# Patient Record
Sex: Female | Born: 1945
Health system: Southern US, Community
[De-identification: ages and names within clinical notes are randomized; demographics above are authoritative.]

## PROBLEM LIST (undated history)

## (undated) DIAGNOSIS — K31A0 Gastric intestinal metaplasia, unspecified: Secondary | ICD-10-CM

## (undated) DIAGNOSIS — N200 Calculus of kidney: Secondary | ICD-10-CM

## (undated) DIAGNOSIS — E876 Hypokalemia: Secondary | ICD-10-CM

## (undated) DIAGNOSIS — K2100 Gastro-esophageal reflux disease with esophagitis, without bleeding: Secondary | ICD-10-CM

## (undated) DIAGNOSIS — G588 Other specified mononeuropathies: Secondary | ICD-10-CM

## (undated) DIAGNOSIS — K602 Anal fissure, unspecified: Secondary | ICD-10-CM

## (undated) DIAGNOSIS — E039 Hypothyroidism, unspecified: Secondary | ICD-10-CM

## (undated) DIAGNOSIS — F419 Anxiety disorder, unspecified: Secondary | ICD-10-CM

## (undated) DIAGNOSIS — G4730 Sleep apnea, unspecified: Secondary | ICD-10-CM

## (undated) DIAGNOSIS — I10 Essential (primary) hypertension: Secondary | ICD-10-CM

## (undated) DIAGNOSIS — K219 Gastro-esophageal reflux disease without esophagitis: Secondary | ICD-10-CM

## (undated) DIAGNOSIS — K529 Noninfective gastroenteritis and colitis, unspecified: Secondary | ICD-10-CM

## (undated) HISTORY — DX: Gastro-esophageal reflux disease with esophagitis, without bleeding: K21.00

## (undated) HISTORY — DX: Essential (primary) hypertension: I10

## (undated) HISTORY — DX: Gastric intestinal metaplasia, unspecified: K31.A0

## (undated) HISTORY — DX: Sleep apnea, unspecified: G47.30

## (undated) HISTORY — DX: Hypokalemia: E87.6

## (undated) HISTORY — DX: Gastro-esophageal reflux disease without esophagitis: K21.9

## (undated) HISTORY — DX: Calculus of kidney: N20.0

## (undated) HISTORY — PX: TUBAL LIGATION: SHX77

## (undated) HISTORY — DX: Noninfective gastroenteritis and colitis, unspecified: K52.9

## (undated) HISTORY — DX: Anxiety disorder, unspecified: F41.9

## (undated) HISTORY — DX: Anal fissure, unspecified: K60.2

## (undated) HISTORY — DX: Other specified mononeuropathies: G58.8

---

## 1999-01-06 ENCOUNTER — Encounter: Payer: Self-pay | Admitting: Family Medicine

## 1999-01-06 ENCOUNTER — Ambulatory Visit (HOSPITAL_COMMUNITY): Admission: RE | Admit: 1999-01-06 | Discharge: 1999-01-06 | Payer: Self-pay | Admitting: Family Medicine

## 1999-02-15 ENCOUNTER — Other Ambulatory Visit: Admission: RE | Admit: 1999-02-15 | Discharge: 1999-02-15 | Payer: Self-pay | Admitting: Otolaryngology

## 1999-04-21 ENCOUNTER — Other Ambulatory Visit: Admission: RE | Admit: 1999-04-21 | Discharge: 1999-04-21 | Payer: Self-pay | Admitting: Family Medicine

## 1999-05-31 ENCOUNTER — Encounter: Payer: Self-pay | Admitting: Sports Medicine

## 1999-05-31 ENCOUNTER — Encounter: Admission: RE | Admit: 1999-05-31 | Discharge: 1999-05-31 | Payer: Self-pay | Admitting: Sports Medicine

## 1999-06-10 ENCOUNTER — Encounter: Admission: RE | Admit: 1999-06-10 | Discharge: 1999-06-10 | Payer: Self-pay | Admitting: Sports Medicine

## 1999-06-10 ENCOUNTER — Encounter: Payer: Self-pay | Admitting: Sports Medicine

## 1999-08-22 ENCOUNTER — Encounter: Payer: Self-pay | Admitting: Otolaryngology

## 1999-08-22 ENCOUNTER — Encounter: Admission: RE | Admit: 1999-08-22 | Discharge: 1999-08-22 | Payer: Self-pay | Admitting: Otolaryngology

## 2000-03-05 ENCOUNTER — Encounter: Admission: RE | Admit: 2000-03-05 | Discharge: 2000-03-05 | Payer: Self-pay | Admitting: Otolaryngology

## 2000-03-05 ENCOUNTER — Encounter: Payer: Self-pay | Admitting: Otolaryngology

## 2000-10-19 ENCOUNTER — Encounter: Payer: Self-pay | Admitting: Family Medicine

## 2000-10-19 ENCOUNTER — Encounter: Admission: RE | Admit: 2000-10-19 | Discharge: 2000-10-19 | Payer: Self-pay | Admitting: Family Medicine

## 2001-03-22 ENCOUNTER — Encounter: Payer: Self-pay | Admitting: Otolaryngology

## 2001-03-22 ENCOUNTER — Encounter: Admission: RE | Admit: 2001-03-22 | Discharge: 2001-03-22 | Payer: Self-pay | Admitting: Otolaryngology

## 2001-10-23 ENCOUNTER — Encounter: Admission: RE | Admit: 2001-10-23 | Discharge: 2001-10-23 | Payer: Self-pay | Admitting: Family Medicine

## 2001-10-23 ENCOUNTER — Encounter: Payer: Self-pay | Admitting: Family Medicine

## 2001-10-23 LAB — HM MAMMOGRAPHY

## 2003-05-24 ENCOUNTER — Emergency Department (HOSPITAL_COMMUNITY): Admission: EM | Admit: 2003-05-24 | Discharge: 2003-05-24 | Payer: Self-pay | Admitting: Emergency Medicine

## 2003-08-04 ENCOUNTER — Other Ambulatory Visit: Admission: RE | Admit: 2003-08-04 | Discharge: 2003-08-04 | Payer: Self-pay | Admitting: Emergency Medicine

## 2007-08-05 ENCOUNTER — Ambulatory Visit: Payer: Self-pay | Admitting: Cardiovascular Disease

## 2007-08-13 ENCOUNTER — Encounter (HOSPITAL_COMMUNITY): Admission: RE | Admit: 2007-08-13 | Discharge: 2007-09-12 | Payer: Self-pay | Admitting: Cardiovascular Disease

## 2007-08-13 ENCOUNTER — Ambulatory Visit: Payer: Self-pay | Admitting: Cardiovascular Disease

## 2010-12-20 NOTE — Assessment & Plan Note (Signed)
[facility] [facility] OFFICE NOTE   NAME:Fitzgibbons, YUKTHA KERCHNER                       MRN:          [id number]  DATE:08/13/2007                            DOB:          Apr 21, 1946    STRESS MYOVIEW:  This is a dictation on the stress portion.   Patient exercised for 7 minutes 15 seconds on the same Bruce protocol.  Exercise was stopped due to dyspnea.  Maximum heart rate was 150, peak  blood pressure 188/90, resting EKG was normal.  With stress, there was  no ischemia.   The patient had no chest pain.   Scintigraphic images will follow.     Wallis Bamberg. Johnsie Cancel, MD, [facility]  Electronically Signed    PCN/MedQ  DD: 08/13/2007  DT: 08/13/2007  Job #: [id number]

## 2010-12-20 NOTE — Assessment & Plan Note (Signed)
[place] OFFICE NOTE   NAME:Pospisil, KYANI SIMKIN                       MRN:          [id number]  DATE:08/05/2007                            DOB:          1946-02-17    Ms. Marik is a 65 year old patient referred by Dr. Luan Pulling for chest  pain.  She has previously been a patient of Dr. Burnett Harry.  She has no  documented coronary artery disease.   Her coronary risk factors appear to be primarily hypertension.   She is a nondiabetic, nonsmoker, cholesterol status is unknown.   The patient 2-3 weeks ago had some pulling in her chest, is atypical for  angina.  It was nonexertional, it was worse at night when she laid flat.  She was not started on proton pump inhibitors, the pain seems to have  eased up over last 2-3 weeks and she is currently not having it.   She did have some burping and belching with it.   As far as I know she has not had previous history of peptic ulcer  disease, gallbladder disease.  She has not had a previous EGD or  endoscopy.   The patient is not particularly active.  However, she clearly does not  get exertional chest pains.   She has had occasional dizziness, this has been since she started her  Benicar a couple of weeks ago.  There is some postural component to  this, there is no vertigo.  She has not passed out.  There is no  palpitations, PND, orthopnea.   REVIEW OF SYSTEMS:  Otherwise negative.   PAST MEDICAL HISTORY:  Fairly benign, she is never had surgery before.  She has recently been treated for hypertension and has a history of what  sounds like a goiter with hypothyroidism on treatment.   FAMILY HISTORY:  Noncontributory.   SHE HAS NO KNOWN ALLERGIES.   The patient is married but clearly does not get along with her husband,  she calls him a pain in the ass.   The patient does work at the [employer] in the summertime.  She has  2 children, one lives in [place],  and her daughter who is with her  today sees her on a daily basis.  She does not drink or smoke.   MEDICATIONS:  1. Benicar 20 a day.  2. Pristiq 50 mg a day.  3. Amitriptyline 25 a day.  4. Levothyroxine 50 mcg a day.   EXAM:  Remarkable for a healthy-appearing elderly white female in no  distress.  Weight is 190, blood pressure is 130/80 in each arm, there is  no postural signs, pulse is 88 and regular, there is no evidence of  POTS, respiratory rate is 16, afebrile.  HEENT:  Unremarkable.  NECK:  Supple, no lymphadenopathy, thyromegaly, JVP elevation, no  bruits.  LUNGS:  Clear with good diaphragmatic motion, no wheezing.  S1-S2 with normal heart sounds, PMI normal.  ABDOMEN:  Benign bowel sounds positive, no AAA, no tenderness, no bruit,  no hepatosplenomegaly or hepatojugular reflux.  Distal pulses are intact, no edema.  NEURO:  Nonfocal.  SKIN:  Warm and dry.  No muscular weakness.   EKG shows sinus rhythm with an incomplete right bundle branch block and  is essentially normal.   IMPRESSION:  1. Atypical chest pain, followup stress Myoview.  Continue baby      aspirin a day.  2. Hypertension, currently better controlled, continue Benicar 20 a      day.  3. Dizziness, may be related to new medication, follow.  No clear      signs of postural hypotension.  Follow-up with Dr. Luan Pulling.  4. Hypothyroidism in the setting of goiter.  She tells me that her      thyroid was biopsied and there was no evidence of tumor.  Continue      Synthroid, follow-up TSH in 6 months.   As long as the patient's stress test is normal we will see her on as-  needed basis.  I suspect she may need further workup if her symptoms  recur in the setting of possible reflux symptoms, EDD may be in order.     Wallis Bamberg. Johnsie Cancel, MD, [facility]  Electronically Signed    PCN/MedQ  DD: 08/05/2007  DT: 08/05/2007  Job #: [id number]

## 2011-04-24 ENCOUNTER — Other Ambulatory Visit (HOSPITAL_COMMUNITY): Payer: Self-pay | Admitting: Pulmonary Disease

## 2011-04-27 ENCOUNTER — Ambulatory Visit (HOSPITAL_COMMUNITY)
Admission: RE | Admit: 2011-04-27 | Discharge: 2011-04-27 | Disposition: A | Discharge status: Home / Self Care | Payer: Medicare Other | Source: Ambulatory Visit | Attending: Pulmonary Disease | Admitting: Pulmonary Disease

## 2011-04-27 DIAGNOSIS — R109 Unspecified abdominal pain: Secondary | ICD-10-CM | POA: Insufficient documentation

## 2011-04-27 DIAGNOSIS — R110 Nausea: Secondary | ICD-10-CM | POA: Insufficient documentation

## 2011-04-28 ENCOUNTER — Other Ambulatory Visit (HOSPITAL_COMMUNITY): Payer: Self-pay | Admitting: Pulmonary Disease

## 2011-04-28 DIAGNOSIS — R110 Nausea: Secondary | ICD-10-CM

## 2011-05-01 ENCOUNTER — Other Ambulatory Visit (HOSPITAL_COMMUNITY): Payer: Medicare Other

## 2011-05-02 ENCOUNTER — Encounter (HOSPITAL_COMMUNITY)
Admission: RE | Admit: 2011-05-02 | Discharge: 2011-05-02 | Disposition: A | Discharge status: Home / Self Care | Payer: Medicare Other | Source: Ambulatory Visit | Attending: Pulmonary Disease | Admitting: Pulmonary Disease

## 2011-05-02 ENCOUNTER — Encounter (HOSPITAL_COMMUNITY): Payer: Self-pay

## 2011-05-02 DIAGNOSIS — R112 Nausea with vomiting, unspecified: Secondary | ICD-10-CM | POA: Insufficient documentation

## 2011-05-02 DIAGNOSIS — R109 Unspecified abdominal pain: Secondary | ICD-10-CM | POA: Insufficient documentation

## 2011-05-02 DIAGNOSIS — R110 Nausea: Secondary | ICD-10-CM

## 2011-05-02 MED ORDER — TECHNETIUM TC 99M MEBROFENIN IV KIT
5.0000 | PACK | Freq: Once | INTRAVENOUS | Status: AC | PRN
  Administered 2011-05-02: 5.2 via INTRAVENOUS

## 2011-05-02 MED ORDER — SINCALIDE 5 MCG IJ SOLR
0.0200 ug/kg | Freq: Once | INTRAMUSCULAR | Status: AC
  Administered 2011-05-02: 1.32 ug via INTRAVENOUS
  Filled 2011-05-02: qty 5

## 2011-05-16 NOTE — H&P (Signed)
NTS SOAP Note  Vital Signs:  Vitals as of: 24/11/6948: Systolic 722: Diastolic 66: Heart Rate 88: Temp 97.61F: Height 41f 8in: Weight 155Lbs 0 Ounces: Pain Level 0: BMI 24  BMI : 23.57 kg/m2  Subjective: This 89Years 769Months old Female presents for of ABDOMINAL ISSUES: ,Has been having intermittent episodes of right upper quadrant abdominal pain, nausea, and fatty food intolerance for two years. Is starting to increase in frequency. No fever, chills, jaundice.  Review of Symptoms:  Constitutional: unremarkable  some dizzy spells Eyes:unremarkable  Nose/Mouth/Throat:unremarkable  Cardiovascular:unremarkable  Respiratory: unremarkable  Gastrointestin abdominal pain,nausea,vomiting,heartburn Genitourinary: unremarkable  Musculoskeletal: unremarkable  Skin: unremarkable  Hematolgic/Lymphatic: unremarkable  Allergic/Immunologic:unremarkable    Past Medical History:Reviewed  Past Medical History  Surgical History: BTL Medical Problems: Hypothyroidism Allergies: nkda Medications: levothyroxine   Social History: Reviewed   Social History  Preferred Language: English ([place]) Race: White Ethnicity: Not Hispanic / Latino Age: [age]Years 7 Months Marital Status: M Alcohol: No Recreational drug(s): No   Smoking Status: Never smoker reviewed on 05/16/2011  Family History: Reviewed  Family History  Father: Cancer    Objective Information:  General: Well appearing, well nourished in no distress.  Skin:no rash or prominent lesions  Head: Atraumatic; no masses; no abnormalities  No scleral icterus Neck: Supple without lymphadenopathy.  Heart: RRR, no murmur or gallop. Normal S1, S2. No S3, S4.  Lungs:CTA bilaterally, no wheezes, rhonchi, rales. Breathing unlabored.  Abdomen: Soft, ND, no HSM, no masses. Slightly tender in the right upper quadrant to palpation.  Extremities: unremarkable   U/S of gallbladder negative  HIDA: low gallblader ejection with  reproducible symptoms with CCK injection  Assessment: Chronic cholecystitis  Diagnosis & Procedure: DiagnosisCode: 575.11, ProcedureCode: [id number]    Plan:Scheduled for laparoscopic cholecystectomy on 05/19/11.   Patient Education: Alternative treatments to surgery were discussed with patient (and family).Risks and benefits of procedure were fully explained to the patient (and family) who gave informed consent. Patient/family questions were addressed.  Follow-up: Pending Surgery

## 2011-05-17 ENCOUNTER — Encounter (HOSPITAL_COMMUNITY)
Admission: RE | Admit: 2011-05-17 | Discharge: 2011-05-17 | Disposition: A | Discharge status: Home / Self Care | Payer: Medicare Other | Source: Ambulatory Visit | Attending: General Surgery | Admitting: General Surgery

## 2011-05-17 ENCOUNTER — Encounter (HOSPITAL_COMMUNITY): Payer: Self-pay

## 2011-05-17 ENCOUNTER — Other Ambulatory Visit: Payer: Self-pay

## 2011-05-17 HISTORY — DX: Hypothyroidism, unspecified: E03.9

## 2011-05-17 LAB — CBC
HCT: 44.6 % (ref 36.0–46.0)
Hemoglobin: 14.6 g/dL (ref 12.0–15.0)
MCH: 28.7 pg (ref 26.0–34.0)
MCHC: 32.7 g/dL (ref 30.0–36.0)
MCV: 87.8 fL (ref 78.0–100.0)
Platelets: 258 10*3/uL (ref 150–400)
RBC: 5.08 MIL/uL (ref 3.87–5.11)
RDW: 14.1 % (ref 11.5–15.5)
WBC: 7.1 10*3/uL (ref 4.0–10.5)

## 2011-05-17 LAB — SURGICAL PCR SCREEN
MRSA, PCR: NEGATIVE
Staphylococcus aureus: NEGATIVE

## 2011-05-17 LAB — HEPATIC FUNCTION PANEL
ALT: 8 U/L (ref 0–35)
AST: 13 U/L (ref 0–37)
Albumin: 3.9 g/dL (ref 3.5–5.2)
Alkaline Phosphatase: 72 U/L (ref 39–117)
Bilirubin, Direct: 0.1 mg/dL (ref 0.0–0.3)
Indirect Bilirubin: 0.3 mg/dL (ref 0.3–0.9)
Total Bilirubin: 0.4 mg/dL (ref 0.3–1.2)
Total Protein: 6.7 g/dL (ref 6.0–8.3)

## 2011-05-17 LAB — DIFFERENTIAL
Basophils Absolute: 0 10*3/uL (ref 0.0–0.1)
Basophils Relative: 1 % (ref 0–1)
Eosinophils Absolute: 0.1 10*3/uL (ref 0.0–0.7)
Eosinophils Relative: 1 % (ref 0–5)
Lymphocytes Relative: 37 % (ref 12–46)
Lymphs Abs: 2.7 10*3/uL (ref 0.7–4.0)
Monocytes Absolute: 0.7 10*3/uL (ref 0.1–1.0)
Monocytes Relative: 10 % (ref 3–12)
Neutro Abs: 3.7 10*3/uL (ref 1.7–7.7)
Neutrophils Relative %: 52 % (ref 43–77)

## 2011-05-17 LAB — BASIC METABOLIC PANEL
BUN: 13 mg/dL (ref 6–23)
CO2: 28 mEq/L (ref 19–32)
Calcium: 9.6 mg/dL (ref 8.4–10.5)
Chloride: 102 mEq/L (ref 96–112)
Creatinine, Ser: 0.68 mg/dL (ref 0.50–1.10)
GFR calc Af Amer: >90 mL/min (ref >90)
GFR calc non Af Amer: 90 mL/min — ABNORMAL LOW (ref >90)
Glucose, Bld: 95 mg/dL (ref 70–99)
Potassium: 4.7 mEq/L (ref 3.5–5.1)
Sodium: 138 mEq/L (ref 135–145)

## 2011-05-17 NOTE — Patient Instructions (Signed)
20 Rhonda Bailey  05/17/2011   Your procedure is scheduled on:  05/19/2011  Report to [facility] at 0730 AM.  Call this number if you have problems the morning of surgery: [phone]   Remember:   Do not eat food:After Midnight.  Do not drink clear liquids: After Midnight.  Take these medicines the morning of surgery with A SIP OF WATER: synthroid, Xanax   Do not wear jewelry, make-up or nail polish.  Do not wear lotions, powders, or perfumes. You may wear deodorant.  Do not shave 48 hours prior to surgery.  Do not bring valuables to the hospital.  Contacts, dentures or bridgework may not be worn into surgery.  Leave suitcase in the car. After surgery it may be brought to your room.  For patients admitted to the hospital, checkout time is 11:00 AM the day of discharge.   Patients discharged the day of surgery will not be allowed to drive home.  Name and phone number of your driver:   Special Instructions: CHG Shower Shower 2 days before surgery and 1 day before surgery with Hibiclens.   Please read over the following fact sheets that you were given: Pain Booklet

## 2011-05-18 ENCOUNTER — Encounter (HOSPITAL_COMMUNITY): Admission: RE | Admit: 2011-05-18 | Payer: Medicare Other | Source: Ambulatory Visit

## 2011-05-19 ENCOUNTER — Encounter (HOSPITAL_COMMUNITY): Payer: Self-pay | Admitting: *Deleted

## 2011-05-19 ENCOUNTER — Encounter (HOSPITAL_COMMUNITY)
Admission: RE | Disposition: A | Discharge status: Home / Self Care | Payer: Self-pay | Source: Ambulatory Visit | Attending: General Surgery

## 2011-05-19 ENCOUNTER — Other Ambulatory Visit: Payer: Self-pay | Admitting: General Surgery

## 2011-05-19 ENCOUNTER — Ambulatory Visit (HOSPITAL_COMMUNITY)
Admission: RE | Admit: 2011-05-19 | Discharge: 2011-05-19 | Disposition: A | Discharge status: Home / Self Care | Payer: Medicare Other | Source: Ambulatory Visit | Attending: General Surgery | Admitting: General Surgery

## 2011-05-19 ENCOUNTER — Encounter (HOSPITAL_COMMUNITY): Payer: Self-pay | Admitting: Anesthesiology

## 2011-05-19 ENCOUNTER — Ambulatory Visit (HOSPITAL_COMMUNITY): Payer: Medicare Other | Admitting: Anesthesiology

## 2011-05-19 DIAGNOSIS — Z01812 Encounter for preprocedural laboratory examination: Secondary | ICD-10-CM | POA: Insufficient documentation

## 2011-05-19 DIAGNOSIS — K8010 Calculus of gallbladder with chronic cholecystitis without obstruction: Secondary | ICD-10-CM | POA: Insufficient documentation

## 2011-05-19 HISTORY — DX: Anxiety disorder, unspecified: F41.9

## 2011-05-19 HISTORY — PX: CHOLECYSTECTOMY: SHX55

## 2011-05-19 SURGERY — LAPAROSCOPIC CHOLECYSTECTOMY
Anesthesia: General | Site: Abdomen | Wound class: Clean Contaminated

## 2011-05-19 MED ORDER — BUPIVACAINE HCL 0.5 % IJ SOLN
INTRAMUSCULAR | Status: DC | PRN
  Administered 2011-05-19: 10 mL

## 2011-05-19 MED ORDER — LACTATED RINGERS IV SOLN
INTRAVENOUS | Status: DC | PRN
  Administered 2011-05-19: 08:00:00 via INTRAVENOUS

## 2011-05-19 MED ORDER — NEOSTIGMINE METHYLSULFATE 1 MG/ML IJ SOLN
INTRAMUSCULAR | Status: DC | PRN
  Administered 2011-05-19: 4 mg via INTRAVENOUS

## 2011-05-19 MED ORDER — NEOSTIGMINE METHYLSULFATE 1 MG/ML IJ SOLN
INTRAMUSCULAR | Status: AC
  Filled 2011-05-19: qty 10

## 2011-05-19 MED ORDER — ENOXAPARIN SODIUM 40 MG/0.4ML ~~LOC~~ SOLN
40.0000 mg | Freq: Once | SUBCUTANEOUS | Status: AC
  Administered 2011-05-19: 40 mg via SUBCUTANEOUS

## 2011-05-19 MED ORDER — PROPOFOL 10 MG/ML IV EMUL
INTRAVENOUS | Status: AC
  Filled 2011-05-19: qty 20

## 2011-05-19 MED ORDER — ROCURONIUM BROMIDE 50 MG/5ML IV SOLN
INTRAVENOUS | Status: AC
  Filled 2011-05-19: qty 1

## 2011-05-19 MED ORDER — KETOROLAC TROMETHAMINE 30 MG/ML IJ SOLN
30.0000 mg | Freq: Once | INTRAMUSCULAR | Status: AC
  Administered 2011-05-19: 30 mg via INTRAVENOUS

## 2011-05-19 MED ORDER — GLYCOPYRROLATE 0.2 MG/ML IJ SOLN
INTRAMUSCULAR | Status: DC | PRN
  Administered 2011-05-19: .6 mg via INTRAVENOUS

## 2011-05-19 MED ORDER — FENTANYL CITRATE 0.05 MG/ML IJ SOLN
INTRAMUSCULAR | Status: AC
  Filled 2011-05-19: qty 2

## 2011-05-19 MED ORDER — CEFAZOLIN SODIUM 1-5 GM-% IV SOLN: 1.0000 g | INTRAVENOUS | Status: DC

## 2011-05-19 MED ORDER — GLYCOPYRROLATE 0.2 MG/ML IJ SOLN
INTRAMUSCULAR | Status: AC
  Administered 2011-05-19: 0.2 mg via INTRAVENOUS
  Filled 2011-05-19: qty 1

## 2011-05-19 MED ORDER — MIDAZOLAM HCL 2 MG/2ML IJ SOLN
INTRAMUSCULAR | Status: AC
  Filled 2011-05-19: qty 2

## 2011-05-19 MED ORDER — HYDROCODONE-ACETAMINOPHEN 5-325 MG PO TABS: 1.0000 | ORAL_TABLET | ORAL | Status: AC | PRN

## 2011-05-19 MED ORDER — ROCURONIUM BROMIDE 100 MG/10ML IV SOLN
INTRAVENOUS | Status: DC | PRN
  Administered 2011-05-19: 35 mg via INTRAVENOUS

## 2011-05-19 MED ORDER — MIDAZOLAM HCL 2 MG/2ML IJ SOLN
INTRAMUSCULAR | Status: AC
  Administered 2011-05-19: 2 mg via INTRAVENOUS
  Filled 2011-05-19: qty 2

## 2011-05-19 MED ORDER — ONDANSETRON HCL 4 MG/2ML IJ SOLN
4.0000 mg | Freq: Once | INTRAMUSCULAR | Status: AC
  Administered 2011-05-19: 4 mg via INTRAVENOUS

## 2011-05-19 MED ORDER — ENOXAPARIN SODIUM 40 MG/0.4ML ~~LOC~~ SOLN
SUBCUTANEOUS | Status: AC
  Administered 2011-05-19: 40 mg via SUBCUTANEOUS
  Filled 2011-05-19: qty 0.4

## 2011-05-19 MED ORDER — HEMOSTATIC AGENTS (NO CHARGE) OPTIME
TOPICAL | Status: DC | PRN
  Administered 2011-05-19: 1 via TOPICAL

## 2011-05-19 MED ORDER — MIDAZOLAM HCL 2 MG/2ML IJ SOLN
1.0000 mg | INTRAMUSCULAR | Status: DC | PRN
  Administered 2011-05-19 (×2): 2 mg via INTRAVENOUS

## 2011-05-19 MED ORDER — GLYCOPYRROLATE 0.2 MG/ML IJ SOLN
0.2000 mg | Freq: Once | INTRAMUSCULAR | Status: AC | PRN
  Administered 2011-05-19: 0.2 mg via INTRAVENOUS

## 2011-05-19 MED ORDER — SODIUM CHLORIDE 0.9 % IR SOLN
Status: DC | PRN
  Administered 2011-05-19: 1000 mL

## 2011-05-19 MED ORDER — ONDANSETRON HCL 4 MG/2ML IJ SOLN
INTRAMUSCULAR | Status: AC
  Administered 2011-05-19: 4 mg via INTRAVENOUS
  Filled 2011-05-19: qty 2

## 2011-05-19 MED ORDER — PROPOFOL 10 MG/ML IV EMUL
INTRAVENOUS | Status: DC | PRN
  Administered 2011-05-19: 150 mg via INTRAVENOUS

## 2011-05-19 MED ORDER — ONDANSETRON HCL 4 MG/2ML IJ SOLN: 4.0000 mg | Freq: Once | INTRAMUSCULAR | Status: DC | PRN

## 2011-05-19 MED ORDER — LIDOCAINE HCL (PF) 1 % IJ SOLN
INTRAMUSCULAR | Status: AC
  Filled 2011-05-19: qty 5

## 2011-05-19 MED ORDER — KETOROLAC TROMETHAMINE 30 MG/ML IJ SOLN
INTRAMUSCULAR | Status: AC
  Administered 2011-05-19: 30 mg via INTRAVENOUS
  Filled 2011-05-19: qty 1

## 2011-05-19 MED ORDER — LACTATED RINGERS IV SOLN
INTRAVENOUS | Status: DC
  Administered 2011-05-19: 08:00:00 via INTRAVENOUS

## 2011-05-19 MED ORDER — CEFAZOLIN SODIUM 1-5 GM-% IV SOLN
INTRAVENOUS | Status: DC | PRN
  Administered 2011-05-19: 1 g via INTRAVENOUS

## 2011-05-19 MED ORDER — FENTANYL CITRATE 0.05 MG/ML IJ SOLN
25.0000 ug | INTRAMUSCULAR | Status: DC | PRN
  Administered 2011-05-19: 50 ug via INTRAVENOUS

## 2011-05-19 MED ORDER — CEFAZOLIN SODIUM 1-5 GM-% IV SOLN
INTRAVENOUS | Status: AC
  Filled 2011-05-19: qty 50

## 2011-05-19 MED ORDER — LIDOCAINE HCL (CARDIAC) 10 MG/ML IV SOLN
INTRAVENOUS | Status: DC | PRN
  Administered 2011-05-19: 50 mg via INTRAVENOUS

## 2011-05-19 MED ORDER — FENTANYL CITRATE 0.05 MG/ML IJ SOLN
INTRAMUSCULAR | Status: DC | PRN
  Administered 2011-05-19: 100 ug via INTRAVENOUS
  Administered 2011-05-19 (×3): 50 ug via INTRAVENOUS

## 2011-05-19 MED ORDER — BUPIVACAINE HCL (PF) 0.5 % IJ SOLN
INTRAMUSCULAR | Status: AC
  Filled 2011-05-19: qty 30

## 2011-05-19 MED ORDER — ACETAMINOPHEN 325 MG PO TABS: 325.0000 mg | ORAL_TABLET | ORAL | Status: DC | PRN

## 2011-05-19 MED ORDER — FENTANYL CITRATE 0.05 MG/ML IJ SOLN
INTRAMUSCULAR | Status: AC
  Filled 2011-05-19: qty 5

## 2011-05-19 MED ORDER — GLYCOPYRROLATE 0.2 MG/ML IJ SOLN
INTRAMUSCULAR | Status: AC
  Filled 2011-05-19: qty 2

## 2011-05-19 SURGICAL SUPPLY — 35 items
APPLIER CLIP ROT 10 11.4 M/L (STAPLE) ×2
APR CLP MED LRG 11.4X10 (STAPLE) ×1
BAG HAMPER (MISCELLANEOUS) ×2 IMPLANT
BAG SPEC RTRVL LRG 6X4 10 (ENDOMECHANICALS) ×1
CLIP APPLIE ROT 10 11.4 M/L (STAPLE) ×1 IMPLANT
CLOTH BEACON ORANGE TIMEOUT ST (SAFETY) ×2 IMPLANT
COVER LIGHT HANDLE STERIS (MISCELLANEOUS) ×4 IMPLANT
DECANTER SPIKE VIAL GLASS SM (MISCELLANEOUS) ×2 IMPLANT
DURAPREP 26ML APPLICATOR (WOUND CARE) ×2 IMPLANT
ELECT REM PT RETURN 9FT ADLT (ELECTROSURGICAL) ×2
ELECTRODE REM PT RTRN 9FT ADLT (ELECTROSURGICAL) ×1 IMPLANT
FILTER SMOKE EVAC LAPAROSHD (FILTER) ×2 IMPLANT
FORMALIN 10 PREFIL 120ML (MISCELLANEOUS) ×2 IMPLANT
GLOVE BIO SURGEON STRL SZ7.5 (GLOVE) ×2 IMPLANT
GLOVE BIOGEL PI IND STRL 7.0 (GLOVE) IMPLANT
GLOVE BIOGEL PI INDICATOR 7.0 (GLOVE) ×2
GLOVE ECLIPSE 6.5 STRL STRAW (GLOVE) ×1 IMPLANT
GLOVE ECLIPSE 7.0 STRL STRAW (GLOVE) ×1 IMPLANT
GOWN BRE IMP SLV AUR XL STRL (GOWN DISPOSABLE) ×6 IMPLANT
HEMOSTAT SNOW SURGICEL 2X4 (HEMOSTASIS) ×2 IMPLANT
INST SET LAPROSCOPIC AP (KITS) ×2 IMPLANT
KIT ROOM TURNOVER APOR (KITS) ×2 IMPLANT
KIT TROCAR LAP CHOLE (TROCAR) ×2 IMPLANT
MANIFOLD NEPTUNE II (INSTRUMENTS) ×2 IMPLANT
NS IRRIG 1000ML POUR BTL (IV SOLUTION) ×2 IMPLANT
PACK LAP CHOLE LZT030E (CUSTOM PROCEDURE TRAY) ×2 IMPLANT
PAD ARMBOARD 7.5X6 YLW CONV (MISCELLANEOUS) ×2 IMPLANT
POUCH SPECIMEN RETRIEVAL 10MM (ENDOMECHANICALS) ×2 IMPLANT
SET BASIN LINEN APH (SET/KITS/TRAYS/PACK) ×2 IMPLANT
SPONGE GAUZE 2X2 8PLY STRL LF (GAUZE/BANDAGES/DRESSINGS) ×8 IMPLANT
STAPLER VISISTAT (STAPLE) ×2 IMPLANT
SUT VICRYL 0 UR6 27IN ABS (SUTURE) ×2 IMPLANT
TAPE CLOTH SURG 4X10 WHT LF (GAUZE/BANDAGES/DRESSINGS) ×1 IMPLANT
WARMER LAPAROSCOPE (MISCELLANEOUS) ×2 IMPLANT
YANKAUER SUCT 12FT TUBE ARGYLE (SUCTIONS) ×2 IMPLANT

## 2011-05-19 NOTE — Anesthesia Procedure Notes (Signed)
Procedure Name: Intubation Date/Time: 05/19/2011 10:02 AM Performed by: Carolyne Littles, Izek Corvino Pre-anesthesia Checklist: Patient identified, Patient being monitored, Emergency Drugs available, Timeout performed and Suction available Patient Re-evaluated:Patient Re-evaluated prior to inductionOxygen Delivery Method: Circle System Utilized Preoxygenation: Pre-oxygenation with 100% oxygen Intubation Type: IV induction Ventilation: Mask ventilation without difficulty Laryngoscope Size: Miller and 3 Grade View: Grade II Tube type: Oral Tube size: 7.0 mm Number of attempts: 1 Placement Confirmation: ETT inserted through vocal cords under direct vision,  breath sounds checked- equal and bilateral,  positive ETCO2 and CO2 detector Secured at: 21 cm Tube secured with: Tape

## 2011-05-19 NOTE — Transfer of Care (Signed)
Immediate Anesthesia Transfer of Care Note  Patient: Rhonda Bailey  Procedure(s) Performed:  LAPAROSCOPIC CHOLECYSTECTOMY  Patient Location: PACU  Anesthesia Type: General  Level of Consciousness: awake, alert , oriented and patient cooperative  Airway & Oxygen Therapy: Patient Spontanous Breathing and Patient connected to face mask oxygen  Post-op Assessment: Report given to PACU RN and Post -op Vital signs reviewed and stable  Post vital signs: stable  Complications: No apparent anesthesia complications

## 2011-05-19 NOTE — Interval H&P Note (Signed)
History and Physical Interval Note:   05/19/2011   8:46 AM   Rhonda Bailey  has presented today for surgery, with the diagnosis of Cholelithiasis and cholecystitis without obstruction [574.10]  The various methods of treatment have been discussed with the patient and family. After consideration of risks, benefits and other options for treatment, the patient has consented to  Procedure(s): LAPAROSCOPIC CHOLECYSTECTOMY as a surgical intervention .  I have reviewed the patients' chart and labs.  I have examined the patient.  Questions were answered to the patient's satisfaction.     Jamesetta So  MD

## 2011-05-19 NOTE — Anesthesia Postprocedure Evaluation (Signed)
  Anesthesia Post-op Note  Patient: Rhonda Bailey  Procedure(s) Performed:  LAPAROSCOPIC CHOLECYSTECTOMY  Patient Location: PACU  Anesthesia Type: General  Level of Consciousness: awake, alert , oriented and patient cooperative  Airway and Oxygen Therapy: Patient Spontanous Breathing and Patient connected to face mask oxygen  Post-op Pain: none  Post-op Assessment: Post-op Vital signs reviewed and Patient's Cardiovascular Status Stable  Post-op Vital Signs: stable  Complications: No apparent anesthesia complications

## 2011-05-19 NOTE — Anesthesia Preprocedure Evaluation (Signed)
Anesthesia Evaluation  Name, MR# and DOB Patient awake  General Assessment Comment  Reviewed: Allergy & Precautions, H&P , NPO status , Patient's Chart, lab work & pertinent test results  History of Anesthesia Complications Negative for: history of anesthetic complications  Airway  TM Distance: >3 FB Neck ROM: Full    Dental No notable dental hx.    Pulmonary    Pulmonary exam normal       Cardiovascular Regular Normal    Neuro/Psych PSYCHIATRIC DISORDERS Anxiety Negative Neurological ROS     GI/Hepatic negative GI ROS Neg liver ROS    Endo/Other  Hypothyroidism   Renal/GU negative Renal ROS     Musculoskeletal negative musculoskeletal ROS (+)   Abdominal Normal abdominal exam  (+)   Peds  Hematology negative hematology ROS (+)   Anesthesia Other Findings   Reproductive/Obstetrics negative OB ROS                           Anesthesia Physical Anesthesia Plan  ASA: II  Anesthesia Plan: General   Post-op Pain Management:    Induction: Intravenous  Airway Management Planned: Oral ETT  Additional Equipment:   Intra-op Plan:   Post-operative Plan: Extubation in OR  Informed Consent: I have reviewed the patients History and Physical, chart, labs and discussed the procedure including the risks, benefits and alternatives for the proposed anesthesia with the patient or authorized representative who has indicated his/her understanding and acceptance.     Plan Discussed with: CRNA  Anesthesia Plan Comments:         Anesthesia Quick Evaluation

## 2011-05-19 NOTE — Op Note (Signed)
Patient:  Rhonda Bailey  DOB:  [birth year]  MRN:  [id number]   Preop Diagnosis:  Cholecystitis, cholelithiasis  Postop Diagnosis:  Same  Procedure:  Laparoscopic cholecystectomy  Surgeon:  Aviva Signs, M.D.  Anes:  General endotracheal  Indications:  Patient is a 65 year old white female presents with biliary colic secondary to cholelithiasis. The risks and benefits of the procedure including bleeding, infection, hepatobiliary injury, possibly of an open procedure were fully explained to the patient, gave informed consent.  Procedure note:  Patient is placed the supine position. After induction of general endotracheal anesthesia, the abdomen was prepped and draped using usual sterile technique with DuraPrep. Surgical site confirmation was performed.  A supraumbilical incision was made down the fascia. A Veress needle was introduced into the abdominal cavity and confirmation of placement was done using the saline drop test. The abdomen was then insufflated to 16 mm mercury pressure. An 11 mm trocar was introduced into the abdominal cavity under direct visualization without difficulty. The patient was placed in reverse Trendelenburg position additional 11 mm trocar was placed the epigastric region and 5 mm trochars placed were upper quadrant right flank regions. Liver was inspected and noted within normal limits. The gallbladder was retracted superior laterally. Dissection was begun around the infundibulum of the gallbladder. The cystic duct was first identified. Junction to the infundibulum flow identified. Endoclips placed proximally distally on the cystic duct and the cystic duct was divided. This was likewise done to the cystic artery. The gallbladder is then freed away from the gallbladder fossa using Bovie electrocautery. The gallbladder was delivered through the epigastric trocar site using an Endo Catch bag. The gallbladder fossa was inspected and no abnormal bleeding or bile leakage was  noted. Surgicel was placed the gallbladder fossa. All fluid and air were then evacuated from the abdominal cavity prior to the removal the trochars.  All wounds were irrigated with normal saline. All wounds were injected with 0.5% Sensorcaine. The supraumbilical fascia was reapproximated using 0 Vicryl interrupted suture. All skin incisions were closed using staples. Betadine ointment and dry sterile dressings were applied.  All tape and needle counts were correct at the end of the procedure. The patient was extubated in the operating room went back to recovery room awake in stable condition.  Complications:  None  EBL:  Minimal  Specimen:  Gallbladder

## 2011-05-26 ENCOUNTER — Encounter (HOSPITAL_COMMUNITY): Payer: Self-pay | Admitting: General Surgery

## 2014-04-16 ENCOUNTER — Ambulatory Visit (INDEPENDENT_AMBULATORY_CARE_PROVIDER_SITE_OTHER): Payer: Commercial Managed Care - HMO | Admitting: Otolaryngology

## 2014-04-16 DIAGNOSIS — H903 Sensorineural hearing loss, bilateral: Secondary | ICD-10-CM

## 2014-04-16 DIAGNOSIS — H905 Unspecified sensorineural hearing loss: Secondary | ICD-10-CM

## 2014-08-25 DIAGNOSIS — Z1231 Encounter for screening mammogram for malignant neoplasm of breast: Secondary | ICD-10-CM | POA: Diagnosis not present

## 2015-09-03 DIAGNOSIS — Z1231 Encounter for screening mammogram for malignant neoplasm of breast: Secondary | ICD-10-CM | POA: Diagnosis not present

## 2015-09-03 LAB — HM MAMMOGRAPHY

## 2015-10-25 ENCOUNTER — Other Ambulatory Visit (HOSPITAL_COMMUNITY): Payer: Self-pay | Admitting: Pulmonary Disease

## 2015-10-25 DIAGNOSIS — K219 Gastro-esophageal reflux disease without esophagitis: Secondary | ICD-10-CM | POA: Diagnosis not present

## 2015-10-25 DIAGNOSIS — I10 Essential (primary) hypertension: Secondary | ICD-10-CM | POA: Diagnosis not present

## 2015-10-25 DIAGNOSIS — F419 Anxiety disorder, unspecified: Secondary | ICD-10-CM | POA: Diagnosis not present

## 2015-10-25 DIAGNOSIS — E039 Hypothyroidism, unspecified: Secondary | ICD-10-CM | POA: Diagnosis not present

## 2015-10-25 DIAGNOSIS — Z780 Asymptomatic menopausal state: Secondary | ICD-10-CM

## 2015-10-28 ENCOUNTER — Ambulatory Visit (HOSPITAL_COMMUNITY)
Admission: RE | Admit: 2015-10-28 | Discharge: 2015-10-28 | Disposition: A | Discharge status: Home / Self Care | Payer: Commercial Managed Care - HMO | Source: Ambulatory Visit | Attending: Pulmonary Disease | Admitting: Pulmonary Disease

## 2015-10-28 DIAGNOSIS — Z780 Asymptomatic menopausal state: Secondary | ICD-10-CM | POA: Insufficient documentation

## 2015-10-28 DIAGNOSIS — M810 Age-related osteoporosis without current pathological fracture: Secondary | ICD-10-CM | POA: Insufficient documentation

## 2015-10-28 LAB — HM DEXA SCAN

## 2016-02-23 DIAGNOSIS — Z0000 Encounter for general adult medical examination without abnormal findings: Secondary | ICD-10-CM | POA: Diagnosis not present

## 2016-02-23 DIAGNOSIS — Z23 Encounter for immunization: Secondary | ICD-10-CM | POA: Diagnosis not present

## 2016-03-07 DIAGNOSIS — Z1211 Encounter for screening for malignant neoplasm of colon: Secondary | ICD-10-CM | POA: Diagnosis not present

## 2016-03-22 DIAGNOSIS — D3132 Benign neoplasm of left choroid: Secondary | ICD-10-CM | POA: Diagnosis not present

## 2016-03-22 DIAGNOSIS — H10413 Chronic giant papillary conjunctivitis, bilateral: Secondary | ICD-10-CM | POA: Diagnosis not present

## 2016-03-22 DIAGNOSIS — H04123 Dry eye syndrome of bilateral lacrimal glands: Secondary | ICD-10-CM | POA: Diagnosis not present

## 2016-03-22 DIAGNOSIS — H2513 Age-related nuclear cataract, bilateral: Secondary | ICD-10-CM | POA: Diagnosis not present

## 2016-09-20 DIAGNOSIS — F419 Anxiety disorder, unspecified: Secondary | ICD-10-CM | POA: Diagnosis not present

## 2016-09-20 DIAGNOSIS — I10 Essential (primary) hypertension: Secondary | ICD-10-CM | POA: Diagnosis not present

## 2016-09-20 DIAGNOSIS — E039 Hypothyroidism, unspecified: Secondary | ICD-10-CM | POA: Diagnosis not present

## 2016-09-20 DIAGNOSIS — K210 Gastro-esophageal reflux disease with esophagitis: Secondary | ICD-10-CM | POA: Diagnosis not present

## 2017-03-28 DIAGNOSIS — Z0000 Encounter for general adult medical examination without abnormal findings: Secondary | ICD-10-CM | POA: Diagnosis not present

## 2017-03-28 DIAGNOSIS — F419 Anxiety disorder, unspecified: Secondary | ICD-10-CM | POA: Diagnosis not present

## 2017-03-28 DIAGNOSIS — I10 Essential (primary) hypertension: Secondary | ICD-10-CM | POA: Diagnosis not present

## 2017-03-28 DIAGNOSIS — R7309 Other abnormal glucose: Secondary | ICD-10-CM | POA: Diagnosis not present

## 2017-03-28 DIAGNOSIS — E039 Hypothyroidism, unspecified: Secondary | ICD-10-CM | POA: Diagnosis not present

## 2017-03-28 DIAGNOSIS — K219 Gastro-esophageal reflux disease without esophagitis: Secondary | ICD-10-CM | POA: Diagnosis not present

## 2017-04-26 LAB — FECAL OCCULT BLOOD, GUAIAC: Fecal Occult Blood: NEGATIVE

## 2017-04-27 DIAGNOSIS — Z1211 Encounter for screening for malignant neoplasm of colon: Secondary | ICD-10-CM | POA: Diagnosis not present

## 2017-10-25 DIAGNOSIS — M545 Low back pain: Secondary | ICD-10-CM | POA: Diagnosis not present

## 2017-10-25 DIAGNOSIS — I10 Essential (primary) hypertension: Secondary | ICD-10-CM | POA: Diagnosis not present

## 2017-10-25 DIAGNOSIS — E039 Hypothyroidism, unspecified: Secondary | ICD-10-CM | POA: Diagnosis not present

## 2017-10-25 DIAGNOSIS — K210 Gastro-esophageal reflux disease with esophagitis: Secondary | ICD-10-CM | POA: Diagnosis not present

## 2018-08-06 ENCOUNTER — Other Ambulatory Visit (HOSPITAL_COMMUNITY): Payer: Self-pay | Admitting: Pulmonary Disease

## 2018-08-06 DIAGNOSIS — Z780 Asymptomatic menopausal state: Secondary | ICD-10-CM

## 2018-08-06 DIAGNOSIS — I10 Essential (primary) hypertension: Secondary | ICD-10-CM | POA: Diagnosis not present

## 2018-08-06 DIAGNOSIS — K210 Gastro-esophageal reflux disease with esophagitis: Secondary | ICD-10-CM | POA: Diagnosis not present

## 2018-08-06 DIAGNOSIS — F419 Anxiety disorder, unspecified: Secondary | ICD-10-CM | POA: Diagnosis not present

## 2018-08-06 DIAGNOSIS — E039 Hypothyroidism, unspecified: Secondary | ICD-10-CM | POA: Diagnosis not present

## 2018-08-06 DIAGNOSIS — M545 Low back pain: Secondary | ICD-10-CM | POA: Diagnosis not present

## 2018-08-06 DIAGNOSIS — Z0000 Encounter for general adult medical examination without abnormal findings: Secondary | ICD-10-CM | POA: Diagnosis not present

## 2018-08-15 ENCOUNTER — Encounter (HOSPITAL_COMMUNITY): Payer: Self-pay

## 2018-08-15 ENCOUNTER — Other Ambulatory Visit (HOSPITAL_COMMUNITY): Payer: Commercial Managed Care - HMO

## 2018-08-20 DIAGNOSIS — Z1211 Encounter for screening for malignant neoplasm of colon: Secondary | ICD-10-CM | POA: Diagnosis not present

## 2018-08-20 DIAGNOSIS — Z1212 Encounter for screening for malignant neoplasm of rectum: Secondary | ICD-10-CM | POA: Diagnosis not present

## 2018-08-20 LAB — COLOGUARD: Cologuard: POSITIVE — AB

## 2018-08-22 ENCOUNTER — Encounter (HOSPITAL_COMMUNITY): Payer: Self-pay

## 2018-08-22 ENCOUNTER — Emergency Department (HOSPITAL_COMMUNITY): Payer: Medicare HMO

## 2018-08-22 ENCOUNTER — Other Ambulatory Visit: Payer: Self-pay

## 2018-08-22 ENCOUNTER — Emergency Department (HOSPITAL_COMMUNITY)
Admission: EM | Admit: 2018-08-22 | Discharge: 2018-08-22 | Disposition: A | Discharge status: Home / Self Care | Payer: Medicare HMO | Attending: Emergency Medicine | Admitting: Emergency Medicine

## 2018-08-22 DIAGNOSIS — R197 Diarrhea, unspecified: Secondary | ICD-10-CM | POA: Diagnosis present

## 2018-08-22 DIAGNOSIS — R1030 Lower abdominal pain, unspecified: Secondary | ICD-10-CM | POA: Insufficient documentation

## 2018-08-22 DIAGNOSIS — K625 Hemorrhage of anus and rectum: Secondary | ICD-10-CM | POA: Diagnosis not present

## 2018-08-22 DIAGNOSIS — K529 Noninfective gastroenteritis and colitis, unspecified: Secondary | ICD-10-CM | POA: Insufficient documentation

## 2018-08-22 DIAGNOSIS — Z79899 Other long term (current) drug therapy: Secondary | ICD-10-CM | POA: Insufficient documentation

## 2018-08-22 DIAGNOSIS — E039 Hypothyroidism, unspecified: Secondary | ICD-10-CM | POA: Insufficient documentation

## 2018-08-22 DIAGNOSIS — K6389 Other specified diseases of intestine: Secondary | ICD-10-CM | POA: Diagnosis not present

## 2018-08-22 DIAGNOSIS — E279 Disorder of adrenal gland, unspecified: Secondary | ICD-10-CM | POA: Insufficient documentation

## 2018-08-22 DIAGNOSIS — E876 Hypokalemia: Secondary | ICD-10-CM | POA: Insufficient documentation

## 2018-08-22 DIAGNOSIS — E278 Other specified disorders of adrenal gland: Secondary | ICD-10-CM

## 2018-08-22 LAB — COMPREHENSIVE METABOLIC PANEL
ALT: 15 U/L (ref 0–44)
AST: 18 U/L (ref 15–41)
Albumin: 3.4 g/dL — ABNORMAL LOW (ref 3.5–5.0)
Alkaline Phosphatase: 55 U/L (ref 38–126)
Anion gap: 11 (ref 5–15)
BUN: 14 mg/dL (ref 8–23)
CO2: 25 mmol/L (ref 22–32)
Calcium: 8.5 mg/dL — ABNORMAL LOW (ref 8.9–10.3)
Chloride: 103 mmol/L (ref 98–111)
Creatinine, Ser: 0.88 mg/dL (ref 0.44–1.00)
GFR calc Af Amer: >60 mL/min (ref >60)
GFR calc non Af Amer: >60 mL/min (ref >60)
Glucose, Bld: 107 mg/dL — ABNORMAL HIGH (ref 70–99)
Potassium: 3.2 mmol/L — ABNORMAL LOW (ref 3.5–5.1)
Sodium: 139 mmol/L (ref 135–145)
Total Bilirubin: 0.8 mg/dL (ref 0.3–1.2)
Total Protein: 6.9 g/dL (ref 6.5–8.1)

## 2018-08-22 LAB — C DIFFICILE QUICK SCREEN W PCR REFLEX
C Diff antigen: NEGATIVE
C Diff interpretation: NOT DETECTED
C Diff toxin: NEGATIVE

## 2018-08-22 LAB — URINALYSIS, ROUTINE W REFLEX MICROSCOPIC
Bilirubin Urine: NEGATIVE
Glucose, UA: NEGATIVE mg/dL
Ketones, ur: 20 mg/dL — AB
Nitrite: NEGATIVE
Protein, ur: 30 mg/dL — AB
Specific Gravity, Urine: 1.028 (ref 1.005–1.030)
pH: 5 (ref 5.0–8.0)

## 2018-08-22 LAB — CBC
HCT: 36.4 % (ref 36.0–46.0)
Hemoglobin: 10.7 g/dL — ABNORMAL LOW (ref 12.0–15.0)
MCH: 21.9 pg — ABNORMAL LOW (ref 26.0–34.0)
MCHC: 29.4 g/dL — ABNORMAL LOW (ref 30.0–36.0)
MCV: 74.4 fL — ABNORMAL LOW (ref 80.0–100.0)
Platelets: 400 10*3/uL (ref 150–400)
RBC: 4.89 MIL/uL (ref 3.87–5.11)
RDW: 18.7 % — ABNORMAL HIGH (ref 11.5–15.5)
WBC: 10.1 10*3/uL (ref 4.0–10.5)
nRBC: 0 % (ref 0.0–0.2)

## 2018-08-22 LAB — LIPASE, BLOOD: Lipase: 25 U/L (ref 11–51)

## 2018-08-22 MED ORDER — AMOXICILLIN-POT CLAVULANATE 875-125 MG PO TABS: 1.0000 | ORAL_TABLET | Freq: Two times a day (BID) | ORAL | 0 refills | Status: DC

## 2018-08-22 MED ORDER — AMOXICILLIN-POT CLAVULANATE 875-125 MG PO TABS
1.0000 | ORAL_TABLET | Freq: Once | ORAL | Status: AC
  Administered 2018-08-22: 1 via ORAL
  Filled 2018-08-22: qty 1

## 2018-08-22 MED ORDER — SODIUM CHLORIDE 0.9% FLUSH
3.0000 mL | Freq: Once | INTRAVENOUS | Status: AC
  Administered 2018-08-22: 3 mL via INTRAVENOUS

## 2018-08-22 MED ORDER — SODIUM CHLORIDE 0.9 % IV BOLUS
500.0000 mL | Freq: Once | INTRAVENOUS | Status: AC
  Administered 2018-08-22: 500 mL via INTRAVENOUS

## 2018-08-22 MED ORDER — POTASSIUM CHLORIDE CRYS ER 20 MEQ PO TBCR
40.0000 meq | EXTENDED_RELEASE_TABLET | Freq: Once | ORAL | Status: AC
  Administered 2018-08-22: 40 meq via ORAL
  Filled 2018-08-22: qty 2

## 2018-08-22 MED ORDER — IOPAMIDOL (ISOVUE-300) INJECTION 61%
100.0000 mL | Freq: Once | INTRAVENOUS | Status: AC | PRN
  Administered 2018-08-22: 100 mL via INTRAVENOUS

## 2018-08-22 NOTE — ED Triage Notes (Signed)
Patient c/o lower abdominal pain and diarrhea x 2 weeks ago. Patient denies any blood in her stool.

## 2018-08-22 NOTE — ED Provider Notes (Signed)
Medical screening examination/treatment/procedure(s) were conducted as a shared visit with non-physician practitioner(s) and myself.  I personally evaluated the patient during the encounter. Briefly, the patient is a 73 y.o. female with history of anxiety, hypothyroidism status post cholecystectomy who presents to the ED with persistent diarrhea for 2 weeks.  Episodes are nonbloody, mostly watery.  Denies any recent antibiotics.  Denies any other high risk factors such as travel, food exposures.  Patient already with lab work that was overall unremarkable.  CT scan shows colitis.  C. difficile is negative.  No significant electrolyte abnormality, kidney injury.  Lipase normal.  GI stool pathogen panel has been sent for.  Will start Augmentin.  Recommend close follow-up with primary care doctor.  After this episode she may benefit from colonoscopy.  Was discharged in ED in good condition.  Return precautions.  This chart was dictated using voice recognition software.  Despite best efforts to proofread,  errors can occur which can change the documentation meaning.    EKG Interpretation None            Lennice Sites, DO 08/22/18 2010

## 2018-08-22 NOTE — ED Notes (Signed)
Patient transported to and returned from CT.

## 2018-08-22 NOTE — ED Provider Notes (Signed)
[facility] DEPT Provider Note   CSN: [id number] Arrival date & time: 08/22/18  1025     History   Chief Complaint Chief Complaint  Patient presents with  . Diarrhea  . Abdominal Pain    HPI Rhonda Bailey is a 73 y.o. female with history of anxiety, hypothyroidism status post cholecystectomy and tubal ligation presenting for evaluation of acute onset, persistent diarrhea for 2 weeks.  She reports at least 6-8 episodes of watery nonbloody diarrhea daily.  She was also having some rectal pain with bowel movements and so went to see her PCP who prescribed preparation H and topical hydrocortisone cream.  She noted no improvement in her symptoms and also began taking Imodium at which point she began having intermittent lower abdominal pain which she describes as crampy in nature.  No aggravating or alleviating factors noted.  She denies fevers, chills, chest pain, shortness of breath, urinary symptoms, melena, or hematochezia.  The history is provided by the patient.    Past Medical History:  Diagnosis Date  . Anxiety   . Hypothyroidism     There are no active problems to display for this patient.   Past Surgical History:  Procedure Laterality Date  . CHOLECYSTECTOMY  05/19/2011   Procedure: LAPAROSCOPIC CHOLECYSTECTOMY;  Surgeon: Jamesetta So;  Location: [facility];  Service: General;  Laterality: N/A;  . TUBAL LIGATION       OB History   No obstetric history on file.      Home Medications    Prior to Admission medications   Medication Sig Start Date End Date Taking? Authorizing Provider  ALPRAZolam Duanne Moron) 0.5 MG tablet Take 0.5 mg by mouth at bedtime.    Yes [provider]  amitriptyline (ELAVIL) 25 MG tablet Take 25 mg by mouth every morning.    Yes [provider]  cholecalciferol (VITAMIN D3) 25 MCG (1000 UT) tablet Take 1,000 Units by mouth daily.   Yes [provider]  levothyroxine (SYNTHROID, LEVOTHROID)  25 MCG tablet Take 25 mcg by mouth daily.     Yes [provider]  Multiple Vitamins-Minerals (MULTIVITAMIN ADULT PO) Take 1 tablet by mouth daily.   Yes [provider]  pantoprazole (PROTONIX) 40 MG tablet Take 40 mg by mouth daily.  08/17/18  Yes [provider]  Thiamine HCl (VITAMIN B-1 PO) Take 1 tablet by mouth daily.   Yes [provider]  amoxicillin-clavulanate (AUGMENTIN) 875-125 MG tablet Take 1 tablet by mouth every 12 (twelve) hours. 08/22/18   Renita Papa, PA-C    Family History Family History  Problem Relation Age of Onset  . Anesthesia problems Neg Hx   . Hypotension Neg Hx   . Malignant hyperthermia Neg Hx   . Pseudochol deficiency Neg Hx     Social History Social History   Tobacco Use  . Smoking status: Never Smoker  . Smokeless tobacco: Never Used  Substance Use Topics  . Alcohol use: No  . Drug use: No     Allergies   Patient has no known allergies.   Review of Systems Review of Systems  Constitutional: Negative for chills and fever.  Respiratory: Negative for shortness of breath.   Cardiovascular: Negative for chest pain.  Gastrointestinal: Positive for abdominal pain, diarrhea and rectal pain. Negative for blood in stool, nausea and vomiting.  Genitourinary: Negative for dysuria and hematuria.  All other systems reviewed and are negative.    Physical Exam Updated Vital Signs BP Marland Kitchen)  152/72 (BP Location: Left Arm)   Pulse 95   Temp 98.2 F (36.8 C) (Oral)   Resp 16   Ht 5\' 7"  (1.702 m)   Wt 81.6 kg   SpO2 97%   BMI 28.19 kg/m   Physical Exam Vitals signs and nursing note reviewed.  Constitutional:      General: She is not in acute distress.    Appearance: She is well-developed.  HENT:     Head: Normocephalic and atraumatic.  Eyes:     General:        Right eye: No discharge.        Left eye: No discharge.     Conjunctiva/sclera: Conjunctivae normal.  Neck:     Vascular: No JVD.     Trachea:  No tracheal deviation.  Cardiovascular:     Rate and Rhythm: Normal rate.     Heart sounds: Normal heart sounds.  Pulmonary:     Effort: Pulmonary effort is normal.     Breath sounds: Normal breath sounds.  Abdominal:     General: Bowel sounds are increased. There is no distension.     Palpations: Abdomen is soft.     Tenderness: There is abdominal tenderness in the right lower quadrant, epigastric area, periumbilical area, suprapubic area and left lower quadrant. There is no right CVA tenderness, left CVA tenderness, guarding or rebound. Negative signs include Murphy's sign and Rovsing's sign.  Musculoskeletal:     Comments: No midline spine TTP, no paraspinal muscle tenderness, no deformity, crepitus, or step-off noted   Skin:    General: Skin is dry.     Findings: No erythema.  Neurological:     Mental Status: She is alert.  Psychiatric:        Behavior: Behavior normal.      ED Treatments / Results  Labs (all labs ordered are listed, but only abnormal results are displayed) Labs Reviewed  COMPREHENSIVE METABOLIC PANEL - Abnormal; Notable for the following components:      Result Value   Potassium 3.2 (*)    Glucose, Bld 107 (*)    Calcium 8.5 (*)    Albumin 3.4 (*)    All other components within normal limits  CBC - Abnormal; Notable for the following components:   Hemoglobin 10.7 (*)    MCV 74.4 (*)    MCH 21.9 (*)    MCHC 29.4 (*)    RDW 18.7 (*)    All other components within normal limits  URINALYSIS, ROUTINE W REFLEX MICROSCOPIC - Abnormal; Notable for the following components:   Color, Urine AMBER (*)    Hgb urine dipstick SMALL (*)    Ketones, ur 20 (*)    Protein, ur 30 (*)    Leukocytes, UA TRACE (*)    Bacteria, UA RARE (*)    All other components within normal limits  C DIFFICILE QUICK SCREEN W PCR REFLEX  GASTROINTESTINAL PANEL BY PCR, STOOL (REPLACES STOOL CULTURE)  LIPASE, BLOOD    EKG None  Radiology Ct Abdomen Pelvis W  Contrast  Result Date: 08/22/2018 CLINICAL DATA:  Patient with lower abdominal pain and diarrhea. EXAM: CT ABDOMEN AND PELVIS WITH CONTRAST TECHNIQUE: Multidetector CT imaging of the abdomen and pelvis was performed using the standard protocol following bolus administration of intravenous contrast. CONTRAST:  138mL ISOVUE-300 IOPAMIDOL (ISOVUE-300) INJECTION 61% COMPARISON:  None. FINDINGS: Lower chest: Normal heart size. Dependent atelectasis bilateral lower lobes. No pleural effusion. Hepatobiliary: Liver is normal in size and contour. Gallbladder  surgically absent. No intrahepatic or extrahepatic biliary ductal dilatation. Pancreas: Mildly atrophic Spleen: Unremarkable Adrenals/Urinary Tract: 2.6 cm indeterminate left adrenal nodule (image 23; series 2). Normal right adrenal gland. Kidneys enhance symmetrically with contrast. No hydronephrosis. Urinary bladder is unremarkable. Stomach/Bowel: There is circumferential wall thickening and pericolonic fat stranding involving the descending colon and rectum. No evidence for upstream bowel obstruction. Normal appendix. No free intraperitoneal air. Normal morphology of the stomach. Small hiatal hernia. Vascular/Lymphatic: Normal caliber abdominal aorta. Peripheral calcified atherosclerotic plaque. No retroperitoneal lymphadenopathy. Reproductive: Uterus and adnexal structures are unremarkable. Other: None. Musculoskeletal: Lumbar spine degenerative changes. No aggressive or acute appearing osseous lesions. IMPRESSION: 1. Colonic wall thickening and pericolonic fat stranding about the descending colon and rectum, most compatible with colitis. 2. Indeterminate left adrenal nodule. Recommend follow-up adrenal protocol CT in 12 months. Electronically Signed   By: Lovey Newcomer M.D.   On: 08/22/2018 18:05    Procedures Procedures (including critical care time)  Medications Ordered in ED Medications  sodium chloride flush (NS) 0.9 % injection 3 mL (3 mLs Intravenous  Given 08/22/18 1610)  sodium chloride 0.9 % bolus 500 mL (0 mLs Intravenous Stopped 08/22/18 1737)  iopamidol (ISOVUE-300) 61 % injection 100 mL (100 mLs Intravenous Contrast Given 08/22/18 1711)  potassium chloride SA (K-DUR,KLOR-CON) CR tablet 40 mEq (40 mEq Oral Given 08/22/18 1836)  sodium chloride 0.9 % bolus 500 mL (0 mLs Intravenous Stopped 08/22/18 1931)  amoxicillin-clavulanate (AUGMENTIN) 875-125 MG per tablet 1 tablet (1 tablet Oral Given 08/22/18 1917)     Initial Impression / Assessment and Plan / ED Course  I have reviewed the triage vital signs and the nursing notes.  Pertinent labs & imaging results that were available during my care of the patient were reviewed by me and considered in my medical decision making (see chart for details).     Patient presenting for evaluation of diarrhea and lower abdominal pain for 2 weeks.  She is afebrile, initially mildly tachycardic with resolution on reevaluation.  She is nontoxic in appearance.  No peritoneal signs on examination of the abdomen.  Lab work reviewed by me shows no leukocytosis, mild anemia, and mild hypokalemia.  Otherwise no metabolic derangements or renal insufficiency.  Lipase and LFTs within normal limits.  UA does not suggest UTI or nephrolithiasis but does suggest dehydration.  She was given IV fluids in the ED and p.o. potassium.  Imaging shows evidence of colitis and an indeterminate left adrenal nodule which radiology recommends follow-up imaging in 12 months.  She was able to give a stool sample with negative C. difficile testing.  On reevaluation she is resting comfortably in no apparent distress.  She is tolerating p.o. fluids without difficulty.  Serial abdominal examinations remain benign.  No evidence of acute surgical abdominal pathology including obstruction, perforation, appendicitis, cholecystitis, diverticulitis, or dissection.  Recommend advancing diet slowly.  Will discharge with course of Augmentin.  Recommend  follow-up with PCP and possibly gastroenterology eventually.  Discussed strict ED return precautions.  Patient and daughter verbalized understanding of and agreement with plan and patient stable for discharge home at this time.   Final Clinical Impressions(s) / ED Diagnoses   Final diagnoses:  Colitis  Adrenal nodule ([place])  Hypokalemia    ED Discharge Orders         Ordered    amoxicillin-clavulanate (AUGMENTIN) 875-125 MG tablet  Every 12 hours,   Status:  Discontinued     08/22/18 1917    amoxicillin-clavulanate (AUGMENTIN) 875-125 MG  tablet  Every 12 hours     08/22/18 1918           Debroah Baller 08/22/18 [place], [place], DO 08/22/18 2010

## 2018-08-22 NOTE — Discharge Instructions (Signed)
1. Medications: Please take all of your antibiotics until finished!   You may develop abdominal discomfort or diarrhea from the antibiotic.  You may help offset this with probiotics which you can buy or get in yogurt. Do not eat  or take the probiotics until 2 hours after your antibiotic.   2. Treatment: rest, drink plenty of fluids, advance diet slowly.  Eat a diet of bland foods that will not upset your stomach. 3. Follow Up: Please followup with your primary doctor in 3 days for discussion of your diagnoses and further evaluation after today's visit; if you do not have a primary care doctor use the resource guide provided to find one; Please return to the ER for persistent vomiting, high fevers or worsening symptoms  Of note, your CT scan did show a left adrenal nodule.  Radiology recommends that you get a repeat CT scan in 12 months which your primary care doctor can order for monitoring of this nodule to ensure that it does not appear unchanged.

## 2018-08-23 LAB — GASTROINTESTINAL PANEL BY PCR, STOOL (REPLACES STOOL CULTURE)

## 2018-08-26 ENCOUNTER — Encounter (HOSPITAL_COMMUNITY): Payer: Self-pay | Admitting: Emergency Medicine

## 2018-08-26 ENCOUNTER — Emergency Department (HOSPITAL_COMMUNITY)
Admission: EM | Admit: 2018-08-26 | Discharge: 2018-08-26 | Disposition: A | Discharge status: Home / Self Care | Payer: Medicare HMO | Attending: Emergency Medicine | Admitting: Emergency Medicine

## 2018-08-26 ENCOUNTER — Telehealth: Payer: Self-pay | Admitting: Gastroenterology

## 2018-08-26 DIAGNOSIS — E876 Hypokalemia: Secondary | ICD-10-CM | POA: Diagnosis not present

## 2018-08-26 DIAGNOSIS — E039 Hypothyroidism, unspecified: Secondary | ICD-10-CM | POA: Diagnosis not present

## 2018-08-26 DIAGNOSIS — K5289 Other specified noninfective gastroenteritis and colitis: Secondary | ICD-10-CM | POA: Insufficient documentation

## 2018-08-26 DIAGNOSIS — Z79899 Other long term (current) drug therapy: Secondary | ICD-10-CM | POA: Diagnosis not present

## 2018-08-26 DIAGNOSIS — K529 Noninfective gastroenteritis and colitis, unspecified: Secondary | ICD-10-CM

## 2018-08-26 DIAGNOSIS — R197 Diarrhea, unspecified: Secondary | ICD-10-CM | POA: Diagnosis not present

## 2018-08-26 LAB — CBC WITH DIFFERENTIAL/PLATELET
Abs Immature Granulocytes: 0.11 10*3/uL — ABNORMAL HIGH (ref 0.00–0.07)
Basophils Absolute: 0.1 10*3/uL (ref 0.0–0.1)
Basophils Relative: 1 %
Eosinophils Absolute: 0.3 10*3/uL (ref 0.0–0.5)
Eosinophils Relative: 2 %
HCT: 35.7 % — ABNORMAL LOW (ref 36.0–46.0)
Hemoglobin: 10.4 g/dL — ABNORMAL LOW (ref 12.0–15.0)
Immature Granulocytes: 1 %
Lymphocytes Relative: 15 %
Lymphs Abs: 2.3 10*3/uL (ref 0.7–4.0)
MCH: 21 pg — ABNORMAL LOW (ref 26.0–34.0)
MCHC: 29.1 g/dL — ABNORMAL LOW (ref 30.0–36.0)
MCV: 72.1 fL — ABNORMAL LOW (ref 80.0–100.0)
Monocytes Absolute: 1.8 10*3/uL — ABNORMAL HIGH (ref 0.1–1.0)
Monocytes Relative: 12 %
Neutro Abs: 10.4 10*3/uL — ABNORMAL HIGH (ref 1.7–7.7)
Neutrophils Relative %: 69 %
Platelets: 456 10*3/uL — ABNORMAL HIGH (ref 150–400)
RBC: 4.95 MIL/uL (ref 3.87–5.11)
RDW: 18.7 % — ABNORMAL HIGH (ref 11.5–15.5)
WBC: 14.9 10*3/uL — ABNORMAL HIGH (ref 4.0–10.5)
nRBC: 0 % (ref 0.0–0.2)

## 2018-08-26 LAB — COMPREHENSIVE METABOLIC PANEL
ALT: 13 U/L (ref 0–44)
AST: 13 U/L — ABNORMAL LOW (ref 15–41)
Albumin: 2.9 g/dL — ABNORMAL LOW (ref 3.5–5.0)
Alkaline Phosphatase: 60 U/L (ref 38–126)
Anion gap: 11 (ref 5–15)
BUN: 6 mg/dL — ABNORMAL LOW (ref 8–23)
CO2: 27 mmol/L (ref 22–32)
Calcium: 8.5 mg/dL — ABNORMAL LOW (ref 8.9–10.3)
Chloride: 97 mmol/L — ABNORMAL LOW (ref 98–111)
Creatinine, Ser: 0.76 mg/dL (ref 0.44–1.00)
GFR calc Af Amer: >60 mL/min (ref >60)
GFR calc non Af Amer: >60 mL/min (ref >60)
Glucose, Bld: 123 mg/dL — ABNORMAL HIGH (ref 70–99)
Potassium: 3 mmol/L — ABNORMAL LOW (ref 3.5–5.1)
Sodium: 135 mmol/L (ref 135–145)
Total Bilirubin: 0.3 mg/dL (ref 0.3–1.2)
Total Protein: 6.7 g/dL (ref 6.5–8.1)

## 2018-08-26 MED ORDER — SODIUM CHLORIDE 0.9 % IV BOLUS
1000.0000 mL | Freq: Once | INTRAVENOUS | Status: AC
  Administered 2018-08-26: 1000 mL via INTRAVENOUS

## 2018-08-26 MED ORDER — POTASSIUM CHLORIDE CRYS ER 20 MEQ PO TBCR: 20.0000 meq | EXTENDED_RELEASE_TABLET | Freq: Every day | ORAL | 0 refills | Status: DC

## 2018-08-26 MED ORDER — POTASSIUM CHLORIDE CRYS ER 20 MEQ PO TBCR
EXTENDED_RELEASE_TABLET | ORAL | Status: AC
  Filled 2018-08-26: qty 1

## 2018-08-26 MED ORDER — POTASSIUM CHLORIDE CRYS ER 20 MEQ PO TBCR
40.0000 meq | EXTENDED_RELEASE_TABLET | Freq: Once | ORAL | Status: AC
  Administered 2018-08-26: 40 meq via ORAL
  Filled 2018-08-26: qty 2

## 2018-08-26 MED ORDER — SODIUM CHLORIDE 0.9 % IV SOLN
INTRAVENOUS | Status: DC
  Administered 2018-08-26: 11:00:00 via INTRAVENOUS

## 2018-08-26 NOTE — ED Notes (Signed)
Pt is also complaining of LLQ abdominal tenderness. States pain as soreness.

## 2018-08-26 NOTE — ED Notes (Addendum)
Pt had pills resting on bed when she threw the covers off of her and one pill fell in the ground, second one was saved, one potassium pill had to be overridden to replace the one that fell in the floor. Pt received full dose of potassium

## 2018-08-26 NOTE — Discharge Instructions (Signed)
Call Dr. Oneida Alar office for follow-up for the colitis and for preparation for colonoscopy.  She stated that she would be able to see you on January 29 but call for confirmation of that appointment and time.  Return for any new or worse symptoms.  Make an appointment to follow-up with your regular doctor to have your electrolytes rechecked in a few days.  Potassium here today was on the lower side.  But did not meet threshold for admission.  Potassium supplement phoned into your pharmacy.  Take as directed.  Continue the Imodium.  Return for any new or worse symptoms.  In addition Dr. Oneida Alar recommended cutting the Augmentin antibiotic to half a tablet twice a day.

## 2018-08-26 NOTE — ED Provider Notes (Signed)
[facility] EMERGENCY DEPARTMENT Provider Note   CSN: [id number] Arrival date & time: 08/26/18  0900     History   Chief Complaint Chief Complaint  Patient presents with  . Diarrhea    HPI Rhonda Bailey is a 73 y.o. female.  Patient with about a 2-week history of diarrhea.  Has been having about 8 stools a day.  Patient now having about 4-day.  Is taking Imodium.  Was seen at [facility] on January 16 had extensive work-up there to include CT scan of the abdomen pelvis which showed descending colitis.  Negative C. difficile and negative GI panel.  Patient's daughter is accompanying her.  And feels that she needs admission.  Informed them that we need to find objective criteria for admission but will determine whether admission is indicated.  We will do a work-up to determine if admission is indicated.  Patient denies any blood in her bowel movements.  Denies any fevers.  Has not been on antibiotics at all in the last 3 months.  Was discharged on Augmentin.     Past Medical History:  Diagnosis Date  . Anxiety   . Hypothyroidism     There are no active problems to display for this patient.   Past Surgical History:  Procedure Laterality Date  . CHOLECYSTECTOMY  05/19/2011   Procedure: LAPAROSCOPIC CHOLECYSTECTOMY;  Surgeon: Jamesetta So;  Location: [facility];  Service: General;  Laterality: N/A;  . TUBAL LIGATION       OB History   No obstetric history on file.      Home Medications    Prior to Admission medications   Medication Sig Start Date End Date Taking? Authorizing Provider  ALPRAZolam Duanne Moron) 0.5 MG tablet Take 0.5 mg by mouth at bedtime.    Yes [provider]  amitriptyline (ELAVIL) 25 MG tablet Take 25 mg by mouth every morning.    Yes [provider]  amoxicillin-clavulanate (AUGMENTIN) 875-125 MG tablet Take 1 tablet by mouth every 12 (twelve) hours. 08/22/18  Yes Fawze, Mina A, PA-C  cholecalciferol (VITAMIN D3) 25 MCG (1000 UT) tablet  Take 1,000 Units by mouth daily.   Yes [provider]  levothyroxine (SYNTHROID, LEVOTHROID) 25 MCG tablet Take 25 mcg by mouth daily.     Yes [provider]  loperamide (IMODIUM A-D) 2 MG tablet Take 4 mg by mouth 4 (four) times daily as needed for diarrhea or loose stools.   Yes [provider]  Multiple Vitamins-Minerals (MULTIVITAMIN ADULT PO) Take 1 tablet by mouth daily.   Yes [provider]  pantoprazole (PROTONIX) 40 MG tablet Take 40 mg by mouth daily.  08/17/18  Yes [provider]  Thiamine HCl (VITAMIN B-1 PO) Take 1 tablet by mouth daily.   Yes [provider]  traZODone (DESYREL) 50 MG tablet Take 1 tablet by mouth at bedtime. 08/25/18  Yes [provider]    Family History Family History  Problem Relation Age of Onset  . Anesthesia problems Neg Hx   . Hypotension Neg Hx   . Malignant hyperthermia Neg Hx   . Pseudochol deficiency Neg Hx     Social History Social History   Tobacco Use  . Smoking status: Never Smoker  . Smokeless tobacco: Never Used  Substance Use Topics  . Alcohol use: No  . Drug use: No     Allergies   Patient has no known allergies.   Review of Systems Review of Systems  Constitutional: Positive  for appetite change. Negative for chills and fever.  HENT: Negative for rhinorrhea and sore throat.   Eyes: Negative for visual disturbance.  Respiratory: Negative for cough and shortness of breath.   Cardiovascular: Negative for chest pain and leg swelling.  Gastrointestinal: Positive for diarrhea. Negative for abdominal pain, blood in stool, nausea and vomiting.  Genitourinary: Negative for dysuria.  Musculoskeletal: Negative for back pain and neck pain.  Skin: Negative for rash.  Neurological: Negative for dizziness, light-headedness and headaches.  Hematological: Does not bruise/bleed easily.  Psychiatric/Behavioral: Negative for confusion.     Physical Exam Updated Vital  Signs BP (!) 112/48   Pulse 75   Temp 98 F (36.7 C) (Oral)   Resp 12   Ht 1.702 m (5' 7" )   Wt 81.6 kg   SpO2 95%   BMI 28.18 kg/m   Physical Exam Vitals signs and nursing note reviewed.  Constitutional:      General: She is not in acute distress.    Appearance: She is well-developed.  HENT:     Head: Normocephalic and atraumatic.     Mouth/Throat:     Mouth: Mucous membranes are dry.  Eyes:     Conjunctiva/sclera: Conjunctivae normal.  Neck:     Musculoskeletal: Neck supple.  Cardiovascular:     Rate and Rhythm: Normal rate and regular rhythm.     Heart sounds: No murmur.  Pulmonary:     Effort: Pulmonary effort is normal. No respiratory distress.     Breath sounds: Normal breath sounds.  Abdominal:     General: There is no distension.     Palpations: Abdomen is soft.     Tenderness: There is no abdominal tenderness.  Musculoskeletal: Normal range of motion.  Skin:    General: Skin is warm and dry.  Neurological:     General: No focal deficit present.     Mental Status: She is alert.      ED Treatments / Results  Labs (all labs ordered are listed, but only abnormal results are displayed) Labs Reviewed  COMPREHENSIVE METABOLIC PANEL - Abnormal; Notable for the following components:      Result Value   Potassium 3.0 (*)    Chloride 97 (*)    Glucose, Bld 123 (*)    BUN 6 (*)    Calcium 8.5 (*)    Albumin 2.9 (*)    AST 13 (*)    All other components within normal limits  CBC WITH DIFFERENTIAL/PLATELET - Abnormal; Notable for the following components:   WBC 14.9 (*)    Hemoglobin 10.4 (*)    HCT 35.7 (*)    MCV 72.1 (*)    MCH 21.0 (*)    MCHC 29.1 (*)    RDW 18.7 (*)    Platelets 456 (*)    Neutro Abs 10.4 (*)    Monocytes Absolute 1.8 (*)    Abs Immature Granulocytes 0.11 (*)    All other components within normal limits    EKG None  Radiology No results found.  Procedures Procedures (including critical care time)  Medications Ordered  in ED Medications  0.9 %  sodium chloride infusion ( Intravenous New Bag/Given 08/26/18 1048)  sodium chloride 0.9 % bolus 1,000 mL (0 mLs Intravenous Stopped 08/26/18 1223)  potassium chloride SA (K-DUR,KLOR-CON) CR tablet 40 mEq (40 mEq Oral Given 08/26/18 1215)     Initial Impression / Assessment and Plan / ED Course  I have reviewed the triage vital signs and the  nursing notes.  Pertinent labs & imaging results that were available during my care of the patient were reviewed by me and considered in my medical decision making (see chart for details).    Patient seen at [facility] on January 16 for this complaint.  Is been having persistent diarrhea for 2 weeks.  Now only having about 4 bowel movements a day.  But is taking Imodium.  Labs today show white blood cell count is increased to 14,000.  Potassium down at 3.0.  Patient received 40 mEq of potassium here today.  Will be continued on oral potassium at home.  Close follow-up with primary care doctor to check the electrolytes.  Contacted Dr. Oneida Alar from GI medicine.  Recommending continuing the Imodium and that she will see her in the office on 2022/09/11.  All this passed on to the patient and family.  However they were upset that patient did not meet criteria for admission.  Explained that taking the potassium was important.  They implied that they would not take it.  I explained that it was important to keep the potassium levels up.  They ended up leaving prior to getting their discharge instructions.  They were aware of the follow-up with Dr. Oneida Alar but did not have address or phone number contact.  Patient's labs from the 16th a CT scan showed evidence of descending colitis.  Also the C. difficile and the GI panel were both negative.  Patient received 1 L of IV fluids here.  Patient's BUN and creatinine without evidence of any significant dehydration.  As stated above work-up was done to try to find admission was indicated.  There was no  objective findings requiring admission.  And as stated discussed with Dr. Oneida Alar from GI medicine.  Dr. Oneida Alar recommended cutting the Augmentin in half taking half a tablet twice a day.  Final Clinical Impressions(s) / ED Diagnoses   Final diagnoses:  Colitis  Hypokalemia    ED Discharge Orders    None       Fredia Sorrow, MD 08/26/18 1436

## 2018-08-26 NOTE — ED Triage Notes (Signed)
Pt reports diarrhea x 2 weeks with about 8 stools per day.  Is taking Immodium x2 q6h for diarrhea with no relief.  Was seen at [facility] on 08/22/2018 for same and had negative cdiff/GI panel.  Went to follow up this morning with Dr. Luan Pulling but the office is closed due to illness.

## 2018-08-26 NOTE — Telephone Encounter (Signed)
PLEASE CALL PT FOR NPV 30 MINS JAN 29 @1130 .

## 2018-08-26 NOTE — ED Notes (Signed)
Pt refused to wait for discharge paperwork. Stated they are leaving and very unsatisfied with care. Explained to them ED doctors are not always able to admit patients. It is a process and they have to go through the hospitalist.

## 2018-08-27 NOTE — Telephone Encounter (Signed)
Slot has been opened

## 2018-08-27 NOTE — Telephone Encounter (Signed)
Rhonda Bailey, will you open up this spot on 1/29 at 1130 on [facility] schedule please.

## 2018-08-27 NOTE — Telephone Encounter (Signed)
Called patient and she is aware of OV with [facility] on 1/29 at 1130

## 2018-08-28 ENCOUNTER — Other Ambulatory Visit: Payer: Self-pay

## 2018-08-28 ENCOUNTER — Observation Stay (HOSPITAL_COMMUNITY): Payer: Medicare HMO

## 2018-08-28 ENCOUNTER — Inpatient Hospital Stay: Admission: AD | Admit: 2018-08-28 | Payer: Medicare HMO | Source: Ambulatory Visit | Admitting: Pulmonary Disease

## 2018-08-28 ENCOUNTER — Encounter (HOSPITAL_COMMUNITY): Payer: Self-pay | Admitting: *Deleted

## 2018-08-28 ENCOUNTER — Telehealth: Payer: Self-pay | Admitting: Gastroenterology

## 2018-08-28 ENCOUNTER — Inpatient Hospital Stay (HOSPITAL_COMMUNITY)
Admission: EM | Admit: 2018-08-28 | Discharge: 2018-09-04 | DRG: 392 | Disposition: A | Discharge status: Home / Self Care | Payer: Medicare HMO | Attending: Pulmonary Disease | Admitting: Pulmonary Disease

## 2018-08-28 DIAGNOSIS — Z9049 Acquired absence of other specified parts of digestive tract: Secondary | ICD-10-CM | POA: Diagnosis not present

## 2018-08-28 DIAGNOSIS — R9389 Abnormal findings on diagnostic imaging of other specified body structures: Secondary | ICD-10-CM | POA: Diagnosis present

## 2018-08-28 DIAGNOSIS — E039 Hypothyroidism, unspecified: Secondary | ICD-10-CM | POA: Diagnosis present

## 2018-08-28 DIAGNOSIS — R935 Abnormal findings on diagnostic imaging of other abdominal regions, including retroperitoneum: Secondary | ICD-10-CM

## 2018-08-28 DIAGNOSIS — Z79899 Other long term (current) drug therapy: Secondary | ICD-10-CM

## 2018-08-28 DIAGNOSIS — E279 Disorder of adrenal gland, unspecified: Secondary | ICD-10-CM | POA: Diagnosis not present

## 2018-08-28 DIAGNOSIS — Z79890 Hormone replacement therapy (postmenopausal): Secondary | ICD-10-CM | POA: Diagnosis not present

## 2018-08-28 DIAGNOSIS — K529 Noninfective gastroenteritis and colitis, unspecified: Secondary | ICD-10-CM | POA: Diagnosis present

## 2018-08-28 DIAGNOSIS — D509 Iron deficiency anemia, unspecified: Secondary | ICD-10-CM | POA: Diagnosis present

## 2018-08-28 DIAGNOSIS — E871 Hypo-osmolality and hyponatremia: Secondary | ICD-10-CM | POA: Diagnosis present

## 2018-08-28 DIAGNOSIS — K5730 Diverticulosis of large intestine without perforation or abscess without bleeding: Secondary | ICD-10-CM | POA: Diagnosis present

## 2018-08-28 DIAGNOSIS — E876 Hypokalemia: Secondary | ICD-10-CM

## 2018-08-28 DIAGNOSIS — R109 Unspecified abdominal pain: Secondary | ICD-10-CM | POA: Diagnosis not present

## 2018-08-28 DIAGNOSIS — K644 Residual hemorrhoidal skin tags: Secondary | ICD-10-CM | POA: Diagnosis present

## 2018-08-28 DIAGNOSIS — R948 Abnormal results of function studies of other organs and systems: Secondary | ICD-10-CM | POA: Diagnosis present

## 2018-08-28 DIAGNOSIS — E278 Other specified disorders of adrenal gland: Secondary | ICD-10-CM | POA: Diagnosis present

## 2018-08-28 DIAGNOSIS — G4700 Insomnia, unspecified: Secondary | ICD-10-CM

## 2018-08-28 DIAGNOSIS — A09 Infectious gastroenteritis and colitis, unspecified: Principal | ICD-10-CM

## 2018-08-28 DIAGNOSIS — R197 Diarrhea, unspecified: Secondary | ICD-10-CM | POA: Diagnosis present

## 2018-08-28 DIAGNOSIS — F419 Anxiety disorder, unspecified: Secondary | ICD-10-CM | POA: Diagnosis present

## 2018-08-28 DIAGNOSIS — D649 Anemia, unspecified: Secondary | ICD-10-CM | POA: Diagnosis not present

## 2018-08-28 DIAGNOSIS — E860 Dehydration: Secondary | ICD-10-CM | POA: Diagnosis present

## 2018-08-28 DIAGNOSIS — R933 Abnormal findings on diagnostic imaging of other parts of digestive tract: Secondary | ICD-10-CM | POA: Diagnosis not present

## 2018-08-28 DIAGNOSIS — D72829 Elevated white blood cell count, unspecified: Secondary | ICD-10-CM | POA: Diagnosis not present

## 2018-08-28 LAB — COMPREHENSIVE METABOLIC PANEL
ALT: 17 U/L (ref 0–44)
AST: 15 U/L (ref 15–41)
Albumin: 2.9 g/dL — ABNORMAL LOW (ref 3.5–5.0)
Alkaline Phosphatase: 64 U/L (ref 38–126)
Anion gap: 11 (ref 5–15)
BUN: 8 mg/dL (ref 8–23)
CO2: 24 mmol/L (ref 22–32)
Calcium: 8.1 mg/dL — ABNORMAL LOW (ref 8.9–10.3)
Chloride: 94 mmol/L — ABNORMAL LOW (ref 98–111)
Creatinine, Ser: 0.76 mg/dL (ref 0.44–1.00)
GFR calc Af Amer: >60 mL/min (ref >60)
GFR calc non Af Amer: >60 mL/min (ref >60)
Glucose, Bld: 132 mg/dL — ABNORMAL HIGH (ref 70–99)
Potassium: 3.2 mmol/L — ABNORMAL LOW (ref 3.5–5.1)
Sodium: 129 mmol/L — ABNORMAL LOW (ref 135–145)
Total Bilirubin: 0.3 mg/dL (ref 0.3–1.2)
Total Protein: 6.9 g/dL (ref 6.5–8.1)

## 2018-08-28 LAB — CBC
HCT: 35.7 % — ABNORMAL LOW (ref 36.0–46.0)
Hemoglobin: 10.4 g/dL — ABNORMAL LOW (ref 12.0–15.0)
MCH: 21.1 pg — ABNORMAL LOW (ref 26.0–34.0)
MCHC: 29.1 g/dL — ABNORMAL LOW (ref 30.0–36.0)
MCV: 72.3 fL — ABNORMAL LOW (ref 80.0–100.0)
Platelets: 517 10*3/uL — ABNORMAL HIGH (ref 150–400)
RBC: 4.94 MIL/uL (ref 3.87–5.11)
RDW: 18.8 % — ABNORMAL HIGH (ref 11.5–15.5)
WBC: 15.5 10*3/uL — ABNORMAL HIGH (ref 4.0–10.5)
nRBC: 0 % (ref 0.0–0.2)

## 2018-08-28 LAB — LIPASE, BLOOD: Lipase: 20 U/L (ref 11–51)

## 2018-08-28 MED ORDER — METRONIDAZOLE IN NACL 5-0.79 MG/ML-% IV SOLN
500.0000 mg | Freq: Once | INTRAVENOUS | Status: AC
  Administered 2018-08-28: 500 mg via INTRAVENOUS
  Filled 2018-08-28: qty 100

## 2018-08-28 MED ORDER — KETOROLAC TROMETHAMINE 30 MG/ML IJ SOLN
15.0000 mg | Freq: Once | INTRAMUSCULAR | Status: AC
  Administered 2018-08-28: 15 mg via INTRAVENOUS
  Filled 2018-08-28: qty 1

## 2018-08-28 MED ORDER — CIPROFLOXACIN IN D5W 400 MG/200ML IV SOLN
400.0000 mg | Freq: Once | INTRAVENOUS | Status: AC
  Administered 2018-08-29: 400 mg via INTRAVENOUS
  Filled 2018-08-28: qty 200

## 2018-08-28 MED ORDER — POTASSIUM CHLORIDE IN NACL 40-0.9 MEQ/L-% IV SOLN
INTRAVENOUS | Status: DC
  Administered 2018-08-29: 75 mL/h via INTRAVENOUS
  Filled 2018-08-28 (×2): qty 1000

## 2018-08-28 MED ORDER — SODIUM CHLORIDE 0.9 % IV BOLUS
1000.0000 mL | Freq: Once | INTRAVENOUS | Status: AC
  Administered 2018-08-28: 1000 mL via INTRAVENOUS

## 2018-08-28 NOTE — Telephone Encounter (Signed)
Noted  

## 2018-08-28 NOTE — Telephone Encounter (Signed)
Dr. Luan Pulling office called today wanting to know if you could see the patient today and I offered them the Friday urgent appointment with eric per [facility].  Dr. Luan Pulling said he was going to go ahead and sent her to the hospital today because of the problems she is having, but the nurse at his office said to keep the appointment with Dr. Oneida Alar next week as well.

## 2018-08-28 NOTE — Telephone Encounter (Signed)
REVIEWED. AGREE. NO ADDITIONAL RECOMMENDATIONS. 

## 2018-08-28 NOTE — ED Provider Notes (Signed)
[facility] EMERGENCY DEPARTMENT Provider Note   CSN: [id number] Arrival date & time: 08/28/18  1456     History   Chief Complaint Chief Complaint  Patient presents with  . Diarrhea    HPI Rhonda Bailey is a 73 y.o. female.  Patient complains of diarrhea for couple weeks.  She was seen 6 days ago in the emergency department and had a CT that showed colitis.  She is not getting better and was seen by her family doctor who sent her over here for admission.  The history is provided by the patient.  Diarrhea  Quality:  Copious Severity:  Moderate Onset quality:  Sudden Number of episodes:  8 episodes a day Timing:  Constant Progression:  Worsening Relieved by:  Nothing Worsened by:  Nothing Ineffective treatments:  None tried Associated symptoms: abdominal pain   Associated symptoms: no headaches   Risk factors: recent antibiotic use     Past Medical History:  Diagnosis Date  . Anxiety   . Hypothyroidism     There are no active problems to display for this patient.   Past Surgical History:  Procedure Laterality Date  . CHOLECYSTECTOMY  05/19/2011   Procedure: LAPAROSCOPIC CHOLECYSTECTOMY;  Surgeon: Jamesetta So;  Location: [facility];  Service: General;  Laterality: N/A;  . TUBAL LIGATION       OB History   No obstetric history on file.      Home Medications    Prior to Admission medications   Medication Sig Start Date End Date Taking? Authorizing Provider  ALPRAZolam Duanne Moron) 0.5 MG tablet Take 0.5 mg by mouth at bedtime.    Yes [provider]  amitriptyline (ELAVIL) 25 MG tablet Take 25 mg by mouth every morning.    Yes [provider]  amoxicillin-clavulanate (AUGMENTIN) 875-125 MG tablet Take 1 tablet by mouth every 12 (twelve) hours. 08/22/18  Yes Fawze, Mina A, PA-C  cholecalciferol (VITAMIN D3) 25 MCG (1000 UT) tablet Take 1,000 Units by mouth daily.   Yes [provider]  levothyroxine (SYNTHROID, LEVOTHROID) 25 MCG  tablet Take 25 mcg by mouth daily.     Yes [provider]  loperamide (IMODIUM A-D) 2 MG tablet Take 4 mg by mouth 3 (three) times daily.    Yes [provider]  Multiple Vitamins-Minerals (MULTIVITAMIN ADULT PO) Take 1 tablet by mouth daily.   Yes [provider]  pantoprazole (PROTONIX) 40 MG tablet Take 40 mg by mouth daily.  08/17/18  Yes [provider]  potassium chloride SA (K-DUR,KLOR-CON) 20 MEQ tablet Take 1 tablet (20 mEq total) by mouth daily. 08/26/18  Yes Fredia Sorrow, MD  Thiamine HCl (VITAMIN B-1 PO) Take 1 tablet by mouth daily.   Yes [provider]  traZODone (DESYREL) 50 MG tablet Take 1 tablet by mouth at bedtime. 08/25/18  Yes [provider]    Family History Family History  Problem Relation Age of Onset  . Anesthesia problems Neg Hx   . Hypotension Neg Hx   . Malignant hyperthermia Neg Hx   . Pseudochol deficiency Neg Hx     Social History Social History   Tobacco Use  . Smoking status: Never Smoker  . Smokeless tobacco: Never Used  Substance Use Topics  . Alcohol use: No  . Drug use: No     Allergies   Patient has no known allergies.   Review of Systems Review of Systems  Constitutional: Negative for appetite change and fatigue.  HENT: Negative  for congestion, ear discharge and sinus pressure.   Eyes: Negative for discharge.  Respiratory: Negative for cough.   Cardiovascular: Negative for chest pain.  Gastrointestinal: Positive for abdominal pain and diarrhea.  Genitourinary: Negative for frequency and hematuria.  Musculoskeletal: Negative for back pain.  Skin: Negative for rash.  Neurological: Negative for seizures and headaches.  Psychiatric/Behavioral: Negative for hallucinations.     Physical Exam Updated Vital Signs BP (!) 105/56   Pulse 81   Temp 98.5 F (36.9 C) (Oral)   Resp 16   Ht 5\' 8"  (1.727 m)   Wt 81.6 kg   SpO2 98%   BMI 27.37 kg/m   Physical Exam Vitals  signs and nursing note reviewed.  Constitutional:      Appearance: She is well-developed.  HENT:     Head: Normocephalic.     Nose: Nose normal.  Eyes:     General: No scleral icterus.    Conjunctiva/sclera: Conjunctivae normal.  Neck:     Musculoskeletal: Neck supple.     Thyroid: No thyromegaly.  Cardiovascular:     Rate and Rhythm: Normal rate and regular rhythm.     Heart sounds: No murmur. No friction rub. No gallop.   Pulmonary:     Breath sounds: No stridor. No wheezing or rales.  Chest:     Chest wall: No tenderness.  Abdominal:     General: There is no distension.     Tenderness: There is abdominal tenderness. There is no rebound.  Musculoskeletal: Normal range of motion.  Lymphadenopathy:     Cervical: No cervical adenopathy.  Skin:    Findings: No erythema or rash.  Neurological:     Mental Status: She is oriented to person, place, and time.     Motor: No abnormal muscle tone.     Coordination: Coordination normal.  Psychiatric:        Behavior: Behavior normal.      ED Treatments / Results  Labs (all labs ordered are listed, but only abnormal results are displayed) Labs Reviewed  COMPREHENSIVE METABOLIC PANEL - Abnormal; Notable for the following components:      Result Value   Sodium 129 (*)    Potassium 3.2 (*)    Chloride 94 (*)    Glucose, Bld 132 (*)    Calcium 8.1 (*)    Albumin 2.9 (*)    All other components within normal limits  CBC - Abnormal; Notable for the following components:   WBC 15.5 (*)    Hemoglobin 10.4 (*)    HCT 35.7 (*)    MCV 72.3 (*)    MCH 21.1 (*)    MCHC 29.1 (*)    RDW 18.8 (*)    Platelets 517 (*)    All other components within normal limits  LIPASE, BLOOD  URINALYSIS, ROUTINE W REFLEX MICROSCOPIC    EKG None  Radiology No results found.  Procedures Procedures (including critical care time)  Medications Ordered in ED Medications  sodium chloride 0.9 % bolus 1,000 mL (1,000 mLs Intravenous New  Bag/Given 08/28/18 2217)  metroNIDAZOLE (FLAGYL) IVPB 500 mg (500 mg Intravenous New Bag/Given 08/28/18 2218)  ciprofloxacin (CIPRO) IVPB 400 mg (has no administration in time range)  ketorolac (TORADOL) 30 MG/ML injection 15 mg (15 mg Intravenous Given 08/28/18 2217)     Initial Impression / Assessment and Plan / ED Course  I have reviewed the triage vital signs and the nursing notes.  Pertinent labs & imaging results that were available  during my care of the patient were reviewed by me and considered in my medical decision making (see chart for details).     Patient with colitis.  She will be admitted to medicine with GI consult  Final Clinical Impressions(s) / ED Diagnoses   Final diagnoses:  Diarrhea of infectious origin    ED Discharge Orders    None       Milton Ferguson, MD 08/28/18 2313

## 2018-08-28 NOTE — ED Triage Notes (Signed)
Pt c/o diarrhea x 2-3 weeks, worsening since Thursday. Pt was seen here at [facility] last Thursday and denies feeling any better. Pt reports 6-7 episodes of diarrhea daily. Pt also c/o lower abdominal pain. Denies nausea.

## 2018-08-29 ENCOUNTER — Encounter (HOSPITAL_COMMUNITY): Payer: Self-pay

## 2018-08-29 DIAGNOSIS — D649 Anemia, unspecified: Secondary | ICD-10-CM

## 2018-08-29 DIAGNOSIS — K529 Noninfective gastroenteritis and colitis, unspecified: Secondary | ICD-10-CM

## 2018-08-29 DIAGNOSIS — R935 Abnormal findings on diagnostic imaging of other abdominal regions, including retroperitoneum: Secondary | ICD-10-CM

## 2018-08-29 DIAGNOSIS — E279 Disorder of adrenal gland, unspecified: Secondary | ICD-10-CM

## 2018-08-29 DIAGNOSIS — E871 Hypo-osmolality and hyponatremia: Secondary | ICD-10-CM

## 2018-08-29 DIAGNOSIS — A09 Infectious gastroenteritis and colitis, unspecified: Secondary | ICD-10-CM

## 2018-08-29 DIAGNOSIS — E876 Hypokalemia: Secondary | ICD-10-CM

## 2018-08-29 DIAGNOSIS — G4700 Insomnia, unspecified: Secondary | ICD-10-CM

## 2018-08-29 DIAGNOSIS — K5730 Diverticulosis of large intestine without perforation or abscess without bleeding: Secondary | ICD-10-CM | POA: Diagnosis not present

## 2018-08-29 DIAGNOSIS — D72829 Elevated white blood cell count, unspecified: Secondary | ICD-10-CM

## 2018-08-29 LAB — BASIC METABOLIC PANEL
Anion gap: 10 (ref 5–15)
BUN: 7 mg/dL — ABNORMAL LOW (ref 8–23)
CO2: 24 mmol/L (ref 22–32)
Calcium: 7.6 mg/dL — ABNORMAL LOW (ref 8.9–10.3)
Chloride: 102 mmol/L (ref 98–111)
Creatinine, Ser: 0.7 mg/dL (ref 0.44–1.00)
GFR calc Af Amer: >60 mL/min (ref >60)
GFR calc non Af Amer: >60 mL/min (ref >60)
Glucose, Bld: 132 mg/dL — ABNORMAL HIGH (ref 70–99)
Potassium: 3.5 mmol/L (ref 3.5–5.1)
Sodium: 136 mmol/L (ref 135–145)

## 2018-08-29 LAB — URINALYSIS, ROUTINE W REFLEX MICROSCOPIC
Bacteria, UA: NONE SEEN
Bilirubin Urine: NEGATIVE
Glucose, UA: NEGATIVE mg/dL
Ketones, ur: NEGATIVE mg/dL
Nitrite: NEGATIVE
Protein, ur: NEGATIVE mg/dL
Specific Gravity, Urine: 1.002 — ABNORMAL LOW (ref 1.005–1.030)
pH: 7 (ref 5.0–8.0)

## 2018-08-29 LAB — CBC
HCT: 32.9 % — ABNORMAL LOW (ref 36.0–46.0)
Hemoglobin: 9.8 g/dL — ABNORMAL LOW (ref 12.0–15.0)
MCH: 21.4 pg — ABNORMAL LOW (ref 26.0–34.0)
MCHC: 29.8 g/dL — ABNORMAL LOW (ref 30.0–36.0)
MCV: 71.7 fL — ABNORMAL LOW (ref 80.0–100.0)
Platelets: 494 10*3/uL — ABNORMAL HIGH (ref 150–400)
RBC: 4.59 MIL/uL (ref 3.87–5.11)
RDW: 18.3 % — ABNORMAL HIGH (ref 11.5–15.5)
WBC: 17.6 10*3/uL — ABNORMAL HIGH (ref 4.0–10.5)
nRBC: 0 % (ref 0.0–0.2)

## 2018-08-29 LAB — TRANSFERRIN: Transferrin: 169 mg/dL — ABNORMAL LOW (ref 192–382)

## 2018-08-29 LAB — IRON AND TIBC
Iron: 5 ug/dL — ABNORMAL LOW (ref 28–170)
Saturation Ratios: 2 % — ABNORMAL LOW (ref 10.4–31.8)
TIBC: 236 ug/dL — ABNORMAL LOW (ref 250–450)
UIBC: 231 ug/dL

## 2018-08-29 LAB — FERRITIN: Ferritin: 67 ng/mL (ref 11–307)

## 2018-08-29 MED ORDER — SODIUM CHLORIDE 0.9 % IV SOLN
INTRAVENOUS | Status: DC
  Administered 2018-08-29 – 2018-09-02 (×6): via INTRAVENOUS

## 2018-08-29 MED ORDER — CIPROFLOXACIN IN D5W 400 MG/200ML IV SOLN
400.0000 mg | Freq: Two times a day (BID) | INTRAVENOUS | Status: DC
  Administered 2018-08-29 – 2018-09-02 (×10): 400 mg via INTRAVENOUS
  Filled 2018-08-29 (×11): qty 200

## 2018-08-29 MED ORDER — ADULT MULTIVITAMIN W/MINERALS CH
1.0000 | ORAL_TABLET | Freq: Every day | ORAL | Status: DC
  Administered 2018-08-29 – 2018-09-04 (×6): 1 via ORAL
  Filled 2018-08-29 (×6): qty 1

## 2018-08-29 MED ORDER — LEVOTHYROXINE SODIUM 25 MCG PO TABS
25.0000 ug | ORAL_TABLET | Freq: Every day | ORAL | Status: DC
  Administered 2018-08-29 – 2018-09-04 (×7): 25 ug via ORAL
  Filled 2018-08-29 (×7): qty 1
  Filled 2018-08-29: qty 0.5

## 2018-08-29 MED ORDER — ONDANSETRON HCL 4 MG PO TABS: 4.0000 mg | ORAL_TABLET | Freq: Four times a day (QID) | ORAL | Status: DC | PRN

## 2018-08-29 MED ORDER — LOPERAMIDE HCL 2 MG PO CAPS
2.0000 mg | ORAL_CAPSULE | ORAL | Status: DC | PRN
  Administered 2018-08-29 – 2018-08-30 (×4): 2 mg via ORAL
  Filled 2018-08-29 (×3): qty 1

## 2018-08-29 MED ORDER — POTASSIUM CHLORIDE CRYS ER 20 MEQ PO TBCR
20.0000 meq | EXTENDED_RELEASE_TABLET | Freq: Every day | ORAL | Status: DC
  Administered 2018-08-29: 20 meq via ORAL
  Filled 2018-08-29: qty 1

## 2018-08-29 MED ORDER — TRAZODONE HCL 50 MG PO TABS
50.0000 mg | ORAL_TABLET | Freq: Every day | ORAL | Status: DC
  Administered 2018-08-29 – 2018-09-03 (×6): 50 mg via ORAL
  Filled 2018-08-29 (×7): qty 1

## 2018-08-29 MED ORDER — PANTOPRAZOLE SODIUM 40 MG PO TBEC
40.0000 mg | DELAYED_RELEASE_TABLET | Freq: Every day | ORAL | Status: DC
  Administered 2018-08-29 – 2018-09-03 (×5): 40 mg via ORAL
  Filled 2018-08-29 (×5): qty 1

## 2018-08-29 MED ORDER — ONDANSETRON HCL 4 MG/2ML IJ SOLN: 4.0000 mg | Freq: Four times a day (QID) | INTRAMUSCULAR | Status: DC | PRN

## 2018-08-29 MED ORDER — LOPERAMIDE HCL 2 MG PO CAPS
ORAL_CAPSULE | ORAL | Status: AC
  Administered 2018-08-29: 2 mg via ORAL
  Filled 2018-08-29: qty 1

## 2018-08-29 MED ORDER — METRONIDAZOLE IN NACL 5-0.79 MG/ML-% IV SOLN
500.0000 mg | Freq: Three times a day (TID) | INTRAVENOUS | Status: DC
  Administered 2018-08-29 – 2018-09-03 (×16): 500 mg via INTRAVENOUS
  Filled 2018-08-29 (×16): qty 100

## 2018-08-29 MED ORDER — ACETAMINOPHEN 325 MG PO TABS: 650.0000 mg | ORAL_TABLET | Freq: Four times a day (QID) | ORAL | Status: DC | PRN

## 2018-08-29 MED ORDER — HYDROCORTISONE 2.5 % RE CREA
TOPICAL_CREAM | Freq: Four times a day (QID) | RECTAL | Status: DC
  Administered 2018-08-29: 18:00:00 via RECTAL
  Administered 2018-08-29 (×2): 1 via RECTAL
  Administered 2018-08-30 – 2018-09-02 (×15): via RECTAL
  Administered 2018-09-03: 1 via RECTAL
  Administered 2018-09-03 (×2): via RECTAL
  Administered 2018-09-03: 1 via RECTAL
  Filled 2018-08-29 (×2): qty 28.35

## 2018-08-29 MED ORDER — IOPAMIDOL (ISOVUE-300) INJECTION 61%
50.0000 mL | Freq: Once | INTRAVENOUS | Status: AC | PRN
  Administered 2018-08-29: 30 mL via ORAL

## 2018-08-29 MED ORDER — AMITRIPTYLINE HCL 25 MG PO TABS
25.0000 mg | ORAL_TABLET | Freq: Every day | ORAL | Status: DC
  Administered 2018-08-29: 25 mg via ORAL
  Filled 2018-08-29 (×3): qty 1

## 2018-08-29 MED ORDER — TRAMADOL HCL 50 MG PO TABS: 50.0000 mg | ORAL_TABLET | Freq: Four times a day (QID) | ORAL | Status: DC | PRN

## 2018-08-29 MED ORDER — ACETAMINOPHEN 650 MG RE SUPP: 650.0000 mg | Freq: Four times a day (QID) | RECTAL | Status: DC | PRN

## 2018-08-29 MED ORDER — VITAMIN D 25 MCG (1000 UNIT) PO TABS
1000.0000 [IU] | ORAL_TABLET | Freq: Every day | ORAL | Status: DC
  Administered 2018-08-29 – 2018-09-04 (×6): 1000 [IU] via ORAL
  Filled 2018-08-29 (×7): qty 1

## 2018-08-29 MED ORDER — IOPAMIDOL (ISOVUE-300) INJECTION 61%
100.0000 mL | Freq: Once | INTRAVENOUS | Status: AC | PRN
  Administered 2018-08-29: 100 mL via INTRAVENOUS

## 2018-08-29 NOTE — H&P (Addendum)
H&P        History and Physical    Rhonda Bailey:[id number] DOB: 08-03-1946 DOA: 08/28/2018  PCP: Sinda Du, MD   Patient coming from: home   I have personally briefly reviewed patient's old medical records in [place]  Chief Complaint: diarrhea  HPI: Rhonda Bailey is a 73 y.o. female with medical history significant of anxiety, hypothyroidism presents with diarrhea. Dx with colitis 1/16 via CT . Has been on augmentin with cont diarrhea and nausea. She does has some low abd pain.  Per report her PCP Dr. Luan Pulling wanted to directly admit her however there were no beds available.  She has never had colitis in the past.  She is never had a colonoscopy.  Denies any melena hematochezia or vaginal bleeding.  She states that she does have hemorrhoids that sometimes bleed. Repeat Ct abd pelvis showed worsening colitis.  ED Course: Patient was given IV Cipro and Flagyl 1 L of fluids and IV Toradol with improvement of her pain.  She does continue to have some diarrhea.  She denies any melena or hematochezia.  sHe was found to be hyponatremic and hypokalemic.  She did have a white count of 15,000 which is increasing from her prior.  Review of Systems: Positive for nausea diarrhea abdominal discomfort generalized weakness all others reviewed with patient  and are  negative unless otherwise stated  Past Medical History:  Diagnosis Date  . Anxiety   . Hypothyroidism     Past Surgical History:  Procedure Laterality Date  . CHOLECYSTECTOMY  05/19/2011   Procedure: LAPAROSCOPIC CHOLECYSTECTOMY;  Surgeon: Jamesetta So;  Location: [facility];  Service: General;  Laterality: N/A;  . TUBAL LIGATION       reports that she has never smoked. She has never used smokeless tobacco. She reports that she does not drink alcohol or use drugs.  No Known Allergies  Family History  Problem Relation Age of Onset  . Anesthesia problems Neg Hx   . Hypotension Neg Hx   . Malignant hyperthermia Neg  Hx   . Pseudochol deficiency Neg Hx      Prior to Admission medications   Medication Sig Start Date End Date Taking? Authorizing Provider  ALPRAZolam Duanne Moron) 0.5 MG tablet Take 0.5 mg by mouth at bedtime.    Yes [provider]  amitriptyline (ELAVIL) 25 MG tablet Take 25 mg by mouth every morning.    Yes [provider]  amoxicillin-clavulanate (AUGMENTIN) 875-125 MG tablet Take 1 tablet by mouth every 12 (twelve) hours. 08/22/18  Yes Fawze, Mina A, PA-C  cholecalciferol (VITAMIN D3) 25 MCG (1000 UT) tablet Take 1,000 Units by mouth daily.   Yes [provider]  levothyroxine (SYNTHROID, LEVOTHROID) 25 MCG tablet Take 25 mcg by mouth daily.     Yes [provider]  Multiple Vitamins-Minerals (MULTIVITAMIN ADULT PO) Take 1 tablet by mouth daily.   Yes [provider]  pantoprazole (PROTONIX) 40 MG tablet Take 40 mg by mouth daily.  08/17/18  Yes [provider]  potassium chloride SA (K-DUR,KLOR-CON) 20 MEQ tablet Take 1 tablet (20 mEq total) by mouth daily. 08/26/18  Yes Fredia Sorrow, MD  Thiamine HCl (VITAMIN B-1 PO) Take 1 tablet by mouth daily.   Yes [provider]  traZODone (DESYREL) 50 MG tablet Take 1 tablet by mouth at bedtime. 08/25/18  Yes [provider]    Physical Exam: Vitals:   08/28/18 2300 08/28/18 2330 08/29/18 0152 08/29/18  0153  BP: (!) 105/56 (!) 103/50 (!) 152/71   Pulse: 81 78 98 98  Resp:  16 17   Temp:      TempSrc:      SpO2:  91% 94%   Weight:      Height:        Constitutional: NAD, calm, comfortable, resting, appears ill Vitals:   08/28/18 2300 08/28/18 2330 08/29/18 0152 08/29/18 0153  BP: (!) 105/56 (!) 103/50 (!) 152/71   Pulse: 81 78 98 98  Resp:  16 17   Temp:      TempSrc:      SpO2:  91% 94%   Weight:      Height:       Eyes, lids and conjunctivae normal Respiratory: clear to auscultation bilaterally, no wheezing, no crackles. Normal respiratory effort. No  accessory muscle use.  Cardiovascular: Regular rate and rhythm, no murmurs / rubs / gallops. No extremity edema. 2+ pedal pulses.   Abdomen: Mild lower abdominal tenderness, no masses palpated.  Bowel sounds positive.  Musculoskeletal: no clubbing / cyanosis.  Skin: no rashes, lesions, ulcers. No induration  Psychiatric: Normal judgment and insight. Alert and oriented x 3. Normal mood.    Labs on Admission: I have personally reviewed following labs and imaging studies  CBC: Recent Labs  Lab 08/22/18 1201 08/26/18 1049 08/28/18 2112  WBC 10.1 14.9* 15.5*  NEUTROABS  --  10.4*  --   HGB 10.7* 10.4* 10.4*  HCT 36.4 35.7* 35.7*  MCV 74.4* 72.1* 72.3*  PLT 400 456* 427*   Basic Metabolic Panel: Recent Labs  Lab 08/22/18 1201 08/26/18 1049 08/28/18 2112  NA 139 135 129*  K 3.2* 3.0* 3.2*  CL 103 97* 94*  CO2 25 27 24   GLUCOSE 107* 123* 132*  BUN 14 6* 8  CREATININE 0.88 0.76 0.76  CALCIUM 8.5* 8.5* 8.1*   GFR: Estimated Creatinine Clearance: 71.2 mL/min (by C-G formula based on SCr of 0.76 mg/dL). Liver Function Tests: Recent Labs  Lab 08/22/18 1201 08/26/18 1049 08/28/18 2112  AST 18 13* 15  ALT 15 13 17   ALKPHOS 55 60 64  BILITOT 0.8 0.3 0.3  PROT 6.9 6.7 6.9  ALBUMIN 3.4* 2.9* 2.9*   Recent Labs  Lab 08/22/18 1201 08/28/18 2112  LIPASE 25 20   No results for input(s): AMMONIA in the last 168 hours. Coagulation Profile: No results for input(s): INR, PROTIME in the last 168 hours. Cardiac Enzymes: No results for input(s): CKTOTAL, CKMB, CKMBINDEX, TROPONINI in the last 168 hours. BNP (last 3 results) No results for input(s): PROBNP in the last 8760 hours. HbA1C: No results for input(s): HGBA1C in the last 72 hours. CBG: No results for input(s): GLUCAP in the last 168 hours. Lipid Profile: No results for input(s): CHOL, HDL, LDLCALC, TRIG, CHOLHDL, LDLDIRECT in the last 72 hours. Thyroid Function Tests: No results for input(s): TSH, T4TOTAL, FREET4,  T3FREE, THYROIDAB in the last 72 hours. Anemia Panel: Recent Labs    08/28/18 2112  FERRITIN 67  TIBC 236*  IRON 5*   Urine analysis:    Component Value Date/Time   COLORURINE STRAW (A) 08/29/2018 0113   APPEARANCEUR CLEAR 08/29/2018 0113   LABSPEC 1.002 (L) 08/29/2018 0113   PHURINE 7.0 08/29/2018 0113   GLUCOSEU NEGATIVE 08/29/2018 0113   HGBUR SMALL (A) 08/29/2018 0113   BILIRUBINUR NEGATIVE 08/29/2018 0113   KETONESUR NEGATIVE 08/29/2018 0113   PROTEINUR NEGATIVE 08/29/2018 0113   NITRITE NEGATIVE 08/29/2018 0113  LEUKOCYTESUR TRACE (A) 08/29/2018 0113    Radiological Exams on Admission: Ct Abdomen Pelvis W Contrast  Result Date: 08/29/2018 CLINICAL DATA:  73 year old female with abdominal pain. History of recent colitis. EXAM: CT ABDOMEN AND PELVIS WITH CONTRAST TECHNIQUE: Multidetector CT imaging of the abdomen and pelvis was performed using the standard protocol following bolus administration of intravenous contrast. CONTRAST:  61mL ISOVUE-300 IOPAMIDOL (ISOVUE-300) INJECTION 61%, 135mL ISOVUE-300 IOPAMIDOL (ISOVUE-300) INJECTION 61% COMPARISON:  CT of the abdomen pelvis dated 08/22/2018 FINDINGS: Lower chest: Minimal bibasilar dependent atelectatic changes. The visualized lung bases are otherwise clear. No intra-abdominal free air. Small free fluid within the pelvis. Hepatobiliary: Subcentimeter hypodense focus in the right lobe of the liver is too small to characterize. The liver is otherwise unremarkable. Mild intrahepatic biliary ductal dilatation, likely post cholecystectomy. Pancreas: Unremarkable. No pancreatic ductal dilatation or surrounding inflammatory changes. Spleen: Normal in size without focal abnormality. Adrenals/Urinary Tract: Indeterminate 2 cm left adrenal nodule. The right adrenal gland is unremarkable. Mild fullness of the renal collecting systems bilaterally. There is symmetric enhancement and excretion of contrast by both kidneys. There is a right  extrarenal pelvis. The visualized ureters and urinary bladder appear unremarkable. Stomach/Bowel: There is extensive diverticulosis of the descending and sigmoid colon. There is diffuse thickening and inflammatory changes of the descending and sigmoid colon most compatible with colitis. The degree of inflammation is similar or slightly increased since the prior CT. There is postsurgical changes of Nissen fundoplication. No bowel obstruction. The appendix is normal. Vascular/Lymphatic: Mild aortoiliac atherosclerotic disease. No portal venous gas. There is no adenopathy. Reproductive: The uterus is retroflexed. There is thickened appearance of the endometrium measuring approximately 9 mm. Further evaluation with pelvic ultrasound on a nonemergent basis recommended. No pelvic mass. Other: None Musculoskeletal: Osteopenia with degenerative changes of the spine. No acute osseous pathology. Bilateral hip arthritic changes. IMPRESSION: 1. Findings most consistent with colitis involving the descending and sigmoid colon similar or slightly worsened since the prior CT. 2. Extensive diverticulosis of the descending and sigmoid colon. No bowel obstruction. Normal appendix. 3. Thickened appearance of the endometrium. Further evaluation with pelvic ultrasound on a nonemergent basis recommended. Electronically Signed   By: Anner Crete M.D.   On: 08/29/2018 02:05      Assessment/Plan Principal Problem:   Colitis Active Problems:   Adrenal nodule (HCC)   Hypokalemia   Dehydration with hyponatremia   Leukocytosis   Anemia   Insomnia   Abnormal CT of the abdomen   -IV Cipro Flagyl, soft diet, advance as tolerated, supportive care -Replace potassium IV BMP in the a.m. -Check iron studies.   patient has never had a colonoscopy.  She does have an appointment today with GI.  I asked the family member to reschedule and make sure she follows up -Patient does have some endometrial thickening on CT scan of her  abdomen and pelvis.  She will need outpatient pelvic ultrasound for follow-up Incidental left adrenal nodule seen on CT will need to follow-up imaging in 12 months -Continue home medications for insomnia/anxiety   DVT prophylaxis: SCD Code Status: full  Disposition Plan: Home 2 days  Admission status: Obs med   Shelbie Proctor MD [facility] Pager [phone]  If 7PM-7AM, please contact night-coverage [email or web address] Password [facility]  08/29/2018, 4:14 AM

## 2018-08-29 NOTE — Progress Notes (Addendum)
This is an assumption of care note:  She says she feels a little better.  She has colitis.  She is having trouble with hemorrhoids.  She is still having diarrhea.  She was hypokalemic and hyponatremic and both of those were better.  She is on Cipro and Flagyl.  She is awake and alert.  She looks weak.  Her chest is clear.  She still has some diffuse abdominal tenderness and hyperactive bowel sounds  She has colitis.  I had planned to get her to GI this week but she was too sick and ended up in the hospital.  I will request GI consult.  I am going to treat her hemorrhoids.  Continue IV fluids.  Reduce the potassium in the IV  fluids.  Continue all the other treatments.     Addendum: She has never had colonoscopy and she had positive Cologuard as an outpatient

## 2018-08-29 NOTE — Consult Note (Signed)
Referring Provider: [facility] Primary Care Physician:  Sinda Du, MD Primary Gastroenterologist:  Dr. Oneida Alar (previously unassigned)  Date of Admission: 08/28/2018 Date of Consultation: 08/29/2018  Reason for Consultation: Colitis and positive Cologuard as outpatient  HPI:  Rhonda Bailey is a 73 y.o. female with a past medical history of anxiety and hypothyroidism.  ER provider note reviewed which indicated the patient complained of diarrhea for couple weeks and was seen by the emergency department which had a CT that showed colitis (subsequently treated with Augmentin).  She was not getting any better and her family doctor sent her over for admission.  Patient noted 8 stools a day which is worsening and associated with abdominal pain.  No recent antibiotic use.  Her metabolic panel showed hyponatremia and hypokalemia, likely due to persistent and previous diarrhea.  CBC found hemoglobin at 10.4 (baseline hemoglobin appears to be in the mid 10 range, although this is all within the previous month).  Admitting provider H&P note reviewed which indicated patient has never had a colonoscopy before.  She does have hemorrhoids that sometimes bleed.  She was admitted and given fluids, Cipro, Flagyl as well as Toradol for pain.  White count elevated at 15,000 which is increasing based on previous labs.  The patient was admitted on antibiotics, supportive care, electrolyte replacement, soft diet.  Iron studies were ordered which showed normal ferritin at 67, iron low at 5, saturation low at 2%.  Recheck CBC today found slight decline in hemoglobin to 9.8 with decreasing white blood cell count now at 17.6.  Platelet counts somewhat decreased as well.  Possible hydration effect contributing.  Repeat BMP today found normalization of sodium and potassium.  CT images completed today found findings most consistent with colitis involving the descending and sigmoid colon similar or slightly worsened  since prior CT, extensive diverticulosis in the descending and sigmoid colon without bowel obstruction, thickened appearance of the endometrium with further evaluation via pelvic ultrasound on a nonemergent basis recommended.  CDiff quick scan and GI Pathogen panel negative.  She was sleeping when I entered the room, two family members are at the bedside. She awoke easily to voice. States she hasn't slept well in the past 2 weeks. She confirms the above history. She states her abdominal pain is significantly improved, now just "a little bit sore." Feels like stools are trying to improve; has had about 5 bowel movements today and at least the last few have been "soft, pasty." Has intermittent bleeding with known hemorrhoids and hemorrhoids have been bothering her lately with all the diarrhea. Denies melena or significant/large volume hematochezia. Denies fever, weakness, syncope, dyspnea. No other GI complaints at this time.  Past Medical History:  Diagnosis Date  . Anxiety   . Hypothyroidism     Past Surgical History:  Procedure Laterality Date  . CHOLECYSTECTOMY  05/19/2011   Procedure: LAPAROSCOPIC CHOLECYSTECTOMY;  Surgeon: Jamesetta So;  Location: [facility];  Service: General;  Laterality: N/A;  . TUBAL LIGATION      Prior to Admission medications   Medication Sig Start Date End Date Taking? Authorizing Provider  ALPRAZolam Duanne Moron) 0.5 MG tablet Take 0.5 mg by mouth at bedtime.    Yes [provider]  amitriptyline (ELAVIL) 25 MG tablet Take 25 mg by mouth every morning.    Yes [provider]  amoxicillin-clavulanate (AUGMENTIN) 875-125 MG tablet Take 1 tablet by mouth every 12 (twelve) hours. 08/22/18  Yes Fawze, Mina A, PA-C  cholecalciferol (VITAMIN D3)  25 MCG (1000 UT) tablet Take 1,000 Units by mouth daily.   Yes [provider]  levothyroxine (SYNTHROID, LEVOTHROID) 25 MCG tablet Take 25 mcg by mouth daily.     Yes [provider]  Multiple  Vitamins-Minerals (MULTIVITAMIN ADULT PO) Take 1 tablet by mouth daily.   Yes [provider]  pantoprazole (PROTONIX) 40 MG tablet Take 40 mg by mouth daily.  08/17/18  Yes [provider]  potassium chloride SA (K-DUR,KLOR-CON) 20 MEQ tablet Take 1 tablet (20 mEq total) by mouth daily. 08/26/18  Yes Fredia Sorrow, MD  Thiamine HCl (VITAMIN B-1 PO) Take 1 tablet by mouth daily.   Yes [provider]  traZODone (DESYREL) 50 MG tablet Take 1 tablet by mouth at bedtime. 08/25/18  Yes [provider]    Current Facility-Administered Medications  Medication Dose Route Frequency Provider Last Rate Last Dose  . 0.9 %  sodium chloride infusion   Intravenous Continuous Sinda Du, MD 75 mL/hr at 08/29/18 0950    . acetaminophen (TYLENOL) tablet 650 mg  650 mg Oral Q6H PRN Johnson-Pitts, Endia, MD       Or  . acetaminophen (TYLENOL) suppository 650 mg  650 mg Rectal Q6H PRN Johnson-Pitts, Endia, MD      . amitriptyline (ELAVIL) tablet 25 mg  25 mg Oral Daily Johnson-Pitts, Endia, MD   25 mg at 08/29/18 0941  . cholecalciferol (VITAMIN D3) tablet 1,000 Units  1,000 Units Oral Daily Johnson-Pitts, Endia, MD   1,000 Units at 08/29/18 0942  . ciprofloxacin (CIPRO) IVPB 400 mg  400 mg Intravenous Q12H Johnson-Pitts, Endia, MD 200 mL/hr at 08/29/18 0951 400 mg at 08/29/18 0951  . hydrocortisone (ANUSOL-HC) 2.5 % rectal cream   Rectal QID Sinda Du, MD   1 application at 72/53/66 [phone]  . levothyroxine (SYNTHROID, LEVOTHROID) tablet 25 mcg  25 mcg Oral Q0600 Johnson-Pitts, Endia, MD   25 mcg at 08/29/18 0557  . loperamide (IMODIUM) capsule 2 mg  2 mg Oral PRN Johnson-Pitts, Endia, MD   2 mg at 08/29/18 0942  . metroNIDAZOLE (FLAGYL) IVPB 500 mg  500 mg Intravenous Q8H Johnson-Pitts, Endia, MD   Stopped at 08/29/18 0702  . multivitamin with minerals tablet 1 tablet  1 tablet Oral Daily Johnson-Pitts, Endia, MD   1 tablet at 08/29/18 0942  . ondansetron (ZOFRAN)  tablet 4 mg  4 mg Oral Q6H PRN Johnson-Pitts, Endia, MD       Or  . ondansetron (ZOFRAN) injection 4 mg  4 mg Intravenous Q6H PRN Johnson-Pitts, Endia, MD      . pantoprazole (PROTONIX) EC tablet 40 mg  40 mg Oral Daily Johnson-Pitts, Endia, MD   40 mg at 08/29/18 0942  . potassium chloride SA (K-DUR,KLOR-CON) CR tablet 20 mEq  20 mEq Oral Daily Johnson-Pitts, Endia, MD   20 mEq at 08/29/18 0942  . traMADol (ULTRAM) tablet 50 mg  50 mg Oral Q6H PRN Johnson-Pitts, Endia, MD      . traZODone (DESYREL) tablet 50 mg  50 mg Oral QHS Johnson-Pitts, Endia, MD   50 mg at 08/29/18 0113    Allergies as of 08/28/2018  . (No Known Allergies)    Family History  Problem Relation Age of Onset  . Anesthesia problems Neg Hx   . Hypotension Neg Hx   . Malignant hyperthermia Neg Hx   . Pseudochol deficiency Neg Hx     Social History   Socioeconomic History  . Marital status: Divorced    Spouse  name: Not on file  . Number of children: Not on file  . Years of education: Not on file  . Highest education level: Not on file  Occupational History  . Not on file  Social Needs  . Financial resource strain: Not on file  . Food insecurity:    Worry: Not on file    Inability: Not on file  . Transportation needs:    Medical: Not on file    Non-medical: Not on file  Tobacco Use  . Smoking status: Never Smoker  . Smokeless tobacco: Never Used  Substance and Sexual Activity  . Alcohol use: No  . Drug use: No  . Sexual activity: Not on file  Lifestyle  . Physical activity:    Days per week: Not on file    Minutes per session: Not on file  . Stress: Not on file  Relationships  . Social connections:    Talks on phone: Not on file    Gets together: Not on file    Attends religious service: Not on file    Active member of club or organization: Not on file    Attends meetings of clubs or organizations: Not on file    Relationship status: Not on file  . Intimate partner violence:    Fear of  current or ex partner: Not on file    Emotionally abused: Not on file    Physically abused: Not on file    Forced sexual activity: Not on file  Other Topics Concern  . Not on file  Social History Narrative  . Not on file    Review of Systems: General: Negative for anorexia, weight loss, fever, chills, fatigue, weakness. ENT: Negative for hoarseness, difficulty swallowing. CV: Negative for chest pain, angina, palpitations,  peripheral edema.  Respiratory: Negative for dyspnea at rest, cough, sputum, wheezing.  GI: See history of present illness. MS: Negative for joint pain, low back pain.  Derm: Negative for rash or itching.   Endo: Negative for unusual weight change.  Heme: Negative for bruising or bleeding. Allergy: Negative for rash or hives.  Physical Exam: Vital signs in last 24 hours: Temp:  [97.9 F (36.6 C)-99.2 F (37.3 C)] 99.2 F (37.3 C) (01/23 1252) Pulse Rate:  [78-98] 88 (01/23 1252) Resp:  [16-18] 18 (01/23 1252) BP: (100-152)/(42-77) 125/42 (01/23 1252) SpO2:  [86 %-98 %] 86 % (01/23 1252) Weight:  [81.6 kg-85 kg] 85 kg (01/23 1252)   General:   Alert,  Well-developed, well-nourished, pleasant and cooperative in NAD Head:  Normocephalic and atraumatic. Eyes:  Sclera clear, no icterus. Conjunctiva pink. Ears:  Normal auditory acuity. Neck:  Supple; no masses or thyromegaly. Lungs:  Clear throughout to auscultation. No wheezes, crackles, or rhonchi. No acute distress. Heart:  Regular rate and rhythm; no murmurs, clicks, rubs,  or gallops. Abdomen:  Soft, nontender and nondistended. No masses, hepatosplenomegaly or hernias noted. Normal bowel sounds, without guarding, and without rebound.   Rectal:  Deferred.   Msk:  Symmetrical without gross deformities. Extremities:  Without clubbing or edema. Neurologic:  Alert and  oriented x4;  grossly normal neurologically. Psych:  Alert and cooperative. Normal mood and affect.  Intake/Output from previous day: No  intake/output data recorded. Intake/Output this shift: Total I/O In: 600 [I.V.:600] Out: -   Lab Results: Recent Labs    08/28/18 2112 08/29/18 0527  WBC 15.5* 17.6*  HGB 10.4* 9.8*  HCT 35.7* 32.9*  PLT 517* 494*   BMET Recent Labs  08/28/18 2112 08/29/18 0527  NA 129* 136  K 3.2* 3.5  CL 94* 102  CO2 24 24  GLUCOSE 132* 132*  BUN 8 7*  CREATININE 0.76 0.70  CALCIUM 8.1* 7.6*   LFT Recent Labs    08/28/18 2112  PROT 6.9  ALBUMIN 2.9*  AST 15  ALT 17  ALKPHOS 64  BILITOT 0.3   PT/INR No results for input(s): LABPROT, INR in the last 72 hours. Hepatitis Panel No results for input(s): HEPBSAG, HCVAB, HEPAIGM, HEPBIGM in the last 72 hours. C-Diff No results for input(s): CDIFFTOX in the last 72 hours.  Studies/Results: Ct Abdomen Pelvis W Contrast  Result Date: 08/29/2018 CLINICAL DATA:  73 year old female with abdominal pain. History of recent colitis. EXAM: CT ABDOMEN AND PELVIS WITH CONTRAST TECHNIQUE: Multidetector CT imaging of the abdomen and pelvis was performed using the standard protocol following bolus administration of intravenous contrast. CONTRAST:  33mL ISOVUE-300 IOPAMIDOL (ISOVUE-300) INJECTION 61%, 131mL ISOVUE-300 IOPAMIDOL (ISOVUE-300) INJECTION 61% COMPARISON:  CT of the abdomen pelvis dated 08/22/2018 FINDINGS: Lower chest: Minimal bibasilar dependent atelectatic changes. The visualized lung bases are otherwise clear. No intra-abdominal free air. Small free fluid within the pelvis. Hepatobiliary: Subcentimeter hypodense focus in the right lobe of the liver is too small to characterize. The liver is otherwise unremarkable. Mild intrahepatic biliary ductal dilatation, likely post cholecystectomy. Pancreas: Unremarkable. No pancreatic ductal dilatation or surrounding inflammatory changes. Spleen: Normal in size without focal abnormality. Adrenals/Urinary Tract: Indeterminate 2 cm left adrenal nodule. The right adrenal gland is unremarkable. Mild  fullness of the renal collecting systems bilaterally. There is symmetric enhancement and excretion of contrast by both kidneys. There is a right extrarenal pelvis. The visualized ureters and urinary bladder appear unremarkable. Stomach/Bowel: There is extensive diverticulosis of the descending and sigmoid colon. There is diffuse thickening and inflammatory changes of the descending and sigmoid colon most compatible with colitis. The degree of inflammation is similar or slightly increased since the prior CT. There is postsurgical changes of Nissen fundoplication. No bowel obstruction. The appendix is normal. Vascular/Lymphatic: Mild aortoiliac atherosclerotic disease. No portal venous gas. There is no adenopathy. Reproductive: The uterus is retroflexed. There is thickened appearance of the endometrium measuring approximately 9 mm. Further evaluation with pelvic ultrasound on a nonemergent basis recommended. No pelvic mass. Other: None Musculoskeletal: Osteopenia with degenerative changes of the spine. No acute osseous pathology. Bilateral hip arthritic changes. IMPRESSION: 1. Findings most consistent with colitis involving the descending and sigmoid colon similar or slightly worsened since the prior CT. 2. Extensive diverticulosis of the descending and sigmoid colon. No bowel obstruction. Normal appendix. 3. Thickened appearance of the endometrium. Further evaluation with pelvic ultrasound on a nonemergent basis recommended. Electronically Signed   By: Anner Crete M.D.   On: 08/29/2018 02:05    Impression: Pleasant 73 year old female who is been battling 2 weeks of diarrhea.  She was previously seen in the ER a couple days ago with CT imaging suspicious for descending and sigmoid colitis.  She was given Augmentin and sent home.  She returned to the ER yesterday with worsening symptoms.  CT reimaging found stable or slightly worsening colitis as per previous CT.  She was having multiple stools a day.   Hemorrhoid flareup due to frequent diarrhea.  Her white blood cell count was elevated.  Her short-term iron and iron saturations were low, ferritin normal.  She was admitted on IV fluids and electrolyte replacement as well as pain medication and IV Cipro and Flagyl.  Today seems she is improving somewhat with significant improvement in abdominal pain.  Stools seem to be solidifying and are now more soft/pasty.  Thankfully GI pathogen panel and C. Difficile quick scan were negative.  She seems in better spirits today no other GI complaints  She is never had a colonoscopy  Most likely has infectious colitis.  Antibiotics seem to be improving her condition.  Plan: 1. Continue to monitor bowel movements for changes 2. Monitor abdominal pain 3. Monitor for any significant GI bleed 4. Follow hemoglobin 5. Depending on clinical progression may proceed with flexible sigmoidoscopy versus colonoscopy.  Otherwise we will plan for outpatient colonoscopy after resolution of acute symptoms 6. Supportive measures  Thank you for allowing [place] to participate in the care of [place], DNP, AGNP-C Adult & Gerontological Nurse Practitioner [facility]    LOS: 0 days     08/29/2018, 1:25 PM

## 2018-08-29 NOTE — Care Management Obs Status (Signed)
[facility] NOTIFICATION   Patient Details  Name: Rhonda Bailey MRN: [id number] Date of Birth: 07/11/1946   Medicare Observation Status Notification Given:  Yes    Sherald Barge, RN 08/29/2018, 11:04 AM

## 2018-08-30 LAB — COMPREHENSIVE METABOLIC PANEL
ALT: 12 U/L (ref 0–44)
AST: 11 U/L — ABNORMAL LOW (ref 15–41)
Albumin: 2.1 g/dL — ABNORMAL LOW (ref 3.5–5.0)
Alkaline Phosphatase: 51 U/L (ref 38–126)
Anion gap: 6 (ref 5–15)
BUN: 5 mg/dL — ABNORMAL LOW (ref 8–23)
CO2: 24 mmol/L (ref 22–32)
Calcium: 7.7 mg/dL — ABNORMAL LOW (ref 8.9–10.3)
Chloride: 107 mmol/L (ref 98–111)
Creatinine, Ser: 0.72 mg/dL (ref 0.44–1.00)
GFR calc Af Amer: >60 mL/min (ref >60)
GFR calc non Af Amer: >60 mL/min (ref >60)
Glucose, Bld: 122 mg/dL — ABNORMAL HIGH (ref 70–99)
Potassium: 3.2 mmol/L — ABNORMAL LOW (ref 3.5–5.1)
Sodium: 137 mmol/L (ref 135–145)
Total Bilirubin: 0.7 mg/dL (ref 0.3–1.2)
Total Protein: 5.5 g/dL — ABNORMAL LOW (ref 6.5–8.1)

## 2018-08-30 LAB — CBC WITH DIFFERENTIAL/PLATELET
Abs Immature Granulocytes: 0.12 10*3/uL — ABNORMAL HIGH (ref 0.00–0.07)
Basophils Absolute: 0.1 10*3/uL (ref 0.0–0.1)
Basophils Relative: 1 %
Eosinophils Absolute: 0.5 10*3/uL (ref 0.0–0.5)
Eosinophils Relative: 4 %
HCT: 29.9 % — ABNORMAL LOW (ref 36.0–46.0)
Hemoglobin: 8.6 g/dL — ABNORMAL LOW (ref 12.0–15.0)
Immature Granulocytes: 1 %
Lymphocytes Relative: 18 %
Lymphs Abs: 2.3 10*3/uL (ref 0.7–4.0)
MCH: 21.1 pg — ABNORMAL LOW (ref 26.0–34.0)
MCHC: 28.8 g/dL — ABNORMAL LOW (ref 30.0–36.0)
MCV: 73.3 fL — ABNORMAL LOW (ref 80.0–100.0)
Monocytes Absolute: 1.2 10*3/uL — ABNORMAL HIGH (ref 0.1–1.0)
Monocytes Relative: 9 %
Neutro Abs: 8.5 10*3/uL — ABNORMAL HIGH (ref 1.7–7.7)
Neutrophils Relative %: 67 %
Platelets: 461 10*3/uL — ABNORMAL HIGH (ref 150–400)
RBC: 4.08 MIL/uL (ref 3.87–5.11)
RDW: 18.2 % — ABNORMAL HIGH (ref 11.5–15.5)
WBC: 12.7 10*3/uL — ABNORMAL HIGH (ref 4.0–10.5)
nRBC: 0 % (ref 0.0–0.2)

## 2018-08-30 MED ORDER — SACCHAROMYCES BOULARDII 250 MG PO CAPS
250.0000 mg | ORAL_CAPSULE | Freq: Two times a day (BID) | ORAL | Status: DC
  Administered 2018-08-30 – 2018-09-04 (×10): 250 mg via ORAL
  Filled 2018-08-30 (×10): qty 1

## 2018-08-30 MED ORDER — LOPERAMIDE HCL 2 MG PO CAPS
2.0000 mg | ORAL_CAPSULE | Freq: Two times a day (BID) | ORAL | Status: DC
  Administered 2018-08-30 – 2018-09-03 (×6): 2 mg via ORAL
  Filled 2018-08-30 (×7): qty 1

## 2018-08-30 MED ORDER — POTASSIUM CHLORIDE CRYS ER 20 MEQ PO TBCR
40.0000 meq | EXTENDED_RELEASE_TABLET | Freq: Once | ORAL | Status: AC
  Administered 2018-08-30: 40 meq via ORAL
  Filled 2018-08-30: qty 2

## 2018-08-30 MED ORDER — TRAMADOL HCL 50 MG PO TABS: 25.0000 mg | ORAL_TABLET | Freq: Four times a day (QID) | ORAL | Status: DC | PRN

## 2018-08-30 MED ORDER — POTASSIUM CHLORIDE CRYS ER 20 MEQ PO TBCR
20.0000 meq | EXTENDED_RELEASE_TABLET | Freq: Two times a day (BID) | ORAL | Status: DC
  Administered 2018-08-30 – 2018-09-04 (×10): 20 meq via ORAL
  Filled 2018-08-30 (×9): qty 1

## 2018-08-30 MED ORDER — MAGIC MOUTHWASH
5.0000 mL | Freq: Three times a day (TID) | ORAL | Status: DC
  Administered 2018-08-30 – 2018-09-04 (×20): 5 mL via ORAL
  Filled 2018-08-30 (×19): qty 5

## 2018-08-30 NOTE — Care Management Note (Signed)
Case Management Note  Patient Details  Name: Rhonda Bailey MRN: [id number] Date of Birth: 12/15/1945  Subjective/Objective:     Colitis. From home alone. Independent. Has very supportive daughters at bedside. No assistive devices.                Action/Plan: DC home. Declines any need for home health. No other CM needs known.   Expected Discharge Date:     unk             Expected Discharge Plan:  Home/Self Care  In-House Referral:     Discharge planning Services  CM Consult  Post Acute Care Choice:  NA Choice offered to:  NA  DME Arranged:    DME Agency:     HH Arranged:    HH Agency:     Status of Service:  Completed, signed off  If discussed at [employer] of Stay Meetings, dates discussed:    Additional Comments:  Rhonda Bailey, Chauncey Reading, RN 08/30/2018, 3:30 PM

## 2018-08-30 NOTE — Progress Notes (Signed)
Subjective: She was admitted with colitis dehydration hypokalemia hyponatremia hemorrhoids.  She is still having some diarrhea but it does not seem to be as bad.  She is complaining of some irritation of her mouth.  She overall feels some better.  Her daughter who is at bedside says that she was very sleepy after she received a combination of amitriptyline which is listed as 1 of her home medications but apparently she is not really taking it and tramadol for pain.  Objective: Vital signs in last 24 hours: Temp:  [98 F (36.7 C)-99.2 F (37.3 C)] 98.2 F (36.8 C) (01/24 0539) Pulse Rate:  [83-88] 83 (01/24 0539) Resp:  [18-24] 24 (01/24 0539) BP: (121-133)/(42-50) 133/50 (01/24 0539) SpO2:  [86 %-96 %] 94 % (01/24 0539) Weight:  [85 kg] 85 kg (01/23 1252) Weight change: 3.353 kg Last BM Date: 08/29/18  Intake/Output from previous day: 01/23 0701 - 01/24 0700 In: 2594.2 [P.O.:240; I.V.:1656; IV Piggyback:698.3] Out: -   PHYSICAL EXAM General appearance: alert, cooperative and no distress Resp: clear to auscultation bilaterally Cardio: regular rate and rhythm, S1, S2 normal, no murmur, click, rub or gallop GI: Abdomen is much softer with less tenderness.  Bowel sounds are still hyperactive Extremities: extremities normal, atraumatic, no cyanosis or edema  Lab Results:  Results for orders placed or performed during the hospital encounter of 08/28/18 (from the past 48 hour(s))  Lipase, blood     Status: None   Collection Time: 08/28/18  9:12 PM  Result Value Ref Range   Lipase 20 11 - 51 U/L    Comment: Performed at [facility], [address] [place], [place] [address]  Comprehensive metabolic panel     Status: Abnormal   Collection Time: 08/28/18  9:12 PM  Result Value Ref Range   Sodium 129 (L) 135 - 145 mmol/L   Potassium 3.2 (L) 3.5 - 5.1 mmol/L   Chloride 94 (L) 98 - 111 mmol/L   CO2 24 22 - 32 mmol/L   Glucose, Bld 132 (H) 70 - 99 mg/dL   BUN 8 8 - 23 mg/dL   Creatinine, Ser 0.76 0.44 - 1.00 mg/dL   Calcium 8.1 (L) 8.9 - 10.3 mg/dL   Total Protein 6.9 6.5 - 8.1 g/dL   Albumin 2.9 (L) 3.5 - 5.0 g/dL   AST 15 15 - 41 U/L   ALT 17 0 - 44 U/L   Alkaline Phosphatase 64 38 - 126 U/L   Total Bilirubin 0.3 0.3 - 1.2 mg/dL   GFR calc non Af Amer >60 >60 mL/min   GFR calc Af Amer >60 >60 mL/min   Anion gap 11 5 - 15    Comment: Performed at [facility], [address], [place], [place] [address]  CBC     Status: Abnormal   Collection Time: 08/28/18  9:12 PM  Result Value Ref Range   WBC 15.5 (H) 4.0 - 10.5 K/uL   RBC 4.94 3.87 - 5.11 MIL/uL   Hemoglobin 10.4 (L) 12.0 - 15.0 g/dL   HCT 35.7 (L) 36.0 - 46.0 %   MCV 72.3 (L) 80.0 - 100.0 fL   MCH 21.1 (L) 26.0 - 34.0 pg   MCHC 29.1 (L) 30.0 - 36.0 g/dL   RDW 18.8 (H) 11.5 - 15.5 %   Platelets 517 (H) 150 - 400 K/uL   nRBC 0.0 0.0 - 0.2 %    Comment: Performed at [facility], [address], [place], [place] [address]  Iron and  TIBC     Status: Abnormal   Collection Time: 08/28/18  9:12 PM  Result Value Ref Range   Iron 5 (L) 28 - 170 ug/dL   TIBC 236 (L) 250 - 450 ug/dL   Saturation Ratios 2 (L) 10.4 - 31.8 %   UIBC 231 ug/dL    Comment: Performed at [facility], [address], [place], [place] [address]  Ferritin     Status: None   Collection Time: 08/28/18  9:12 PM  Result Value Ref Range   Ferritin 67 11 - 307 ng/mL    Comment: Performed at [facility], [address], [place], [place] [address]  Transferrin     Status: Abnormal   Collection Time: 08/28/18  9:12 PM  Result Value Ref Range   Transferrin 169 (L) 192 - 382 mg/dL    Comment: Performed at [facility], [address] [place], [place] [address]  Urinalysis, Routine w reflex microscopic     Status: Abnormal   Collection Time: 08/29/18  1:13 AM  Result Value Ref Range   Color, Urine STRAW (A) YELLOW   APPearance CLEAR CLEAR   Specific Gravity, Urine 1.002 (L) 1.005 - 1.030   pH 7.0 5.0 - 8.0   Glucose, UA NEGATIVE  NEGATIVE mg/dL   Hgb urine dipstick SMALL (A) NEGATIVE   Bilirubin Urine NEGATIVE NEGATIVE   Ketones, ur NEGATIVE NEGATIVE mg/dL   Protein, ur NEGATIVE NEGATIVE mg/dL   Nitrite NEGATIVE NEGATIVE   Leukocytes, UA TRACE (A) NEGATIVE   WBC, UA 6-10 0 - 5 WBC/hpf   Bacteria, UA NONE SEEN NONE SEEN    Comment: Performed at [facility], [address] [place], [place] [address]  Basic metabolic panel     Status: Abnormal   Collection Time: 08/29/18  5:27 AM  Result Value Ref Range   Sodium 136 135 - 145 mmol/L    Comment: DELTA CHECK NOTED   Potassium 3.5 3.5 - 5.1 mmol/L   Chloride 102 98 - 111 mmol/L   CO2 24 22 - 32 mmol/L   Glucose, Bld 132 (H) 70 - 99 mg/dL   BUN 7 (L) 8 - 23 mg/dL   Creatinine, Ser 0.70 0.44 - 1.00 mg/dL   Calcium 7.6 (L) 8.9 - 10.3 mg/dL   GFR calc non Af Amer >60 >60 mL/min   GFR calc Af Amer >60 >60 mL/min   Anion gap 10 5 - 15    Comment: Performed at [facility], [address] [place], [place] [address]  CBC     Status: Abnormal   Collection Time: 08/29/18  5:27 AM  Result Value Ref Range   WBC 17.6 (H) 4.0 - 10.5 K/uL   RBC 4.59 3.87 - 5.11 MIL/uL   Hemoglobin 9.8 (L) 12.0 - 15.0 g/dL   HCT 32.9 (L) 36.0 - 46.0 %   MCV 71.7 (L) 80.0 - 100.0 fL   MCH 21.4 (L) 26.0 - 34.0 pg   MCHC 29.8 (L) 30.0 - 36.0 g/dL   RDW 18.3 (H) 11.5 - 15.5 %   Platelets 494 (H) 150 - 400 K/uL   nRBC 0.0 0.0 - 0.2 %    Comment: Performed at [facility], [address] [place], [place] [address]  CBC with Differential/Platelet     Status: Abnormal   Collection Time: 08/30/18  5:25 AM  Result Value Ref Range   WBC 12.7 (H) 4.0 - 10.5 K/uL   RBC 4.08 3.87 - 5.11 MIL/uL   Hemoglobin 8.6 (  L) 12.0 - 15.0 g/dL    Comment: Reticulocyte Hemoglobin testing may be clinically indicated, consider ordering this additional test [id number]    HCT 29.9 (L) 36.0 - 46.0 %   MCV 73.3 (L) 80.0 - 100.0 fL   MCH 21.1 (L) 26.0 - 34.0 pg   MCHC 28.8 (L) 30.0 - 36.0 g/dL   RDW 18.2 (H)  11.5 - 15.5 %   Platelets 461 (H) 150 - 400 K/uL   nRBC 0.0 0.0 - 0.2 %   Neutrophils Relative % 67 %   Neutro Abs 8.5 (H) 1.7 - 7.7 K/uL   Lymphocytes Relative 18 %   Lymphs Abs 2.3 0.7 - 4.0 K/uL   Monocytes Relative 9 %   Monocytes Absolute 1.2 (H) 0.1 - 1.0 K/uL   Eosinophils Relative 4 %   Eosinophils Absolute 0.5 0.0 - 0.5 K/uL   Basophils Relative 1 %   Basophils Absolute 0.1 0.0 - 0.1 K/uL   Immature Granulocytes 1 %   Abs Immature Granulocytes 0.12 (H) 0.00 - 0.07 K/uL    Comment: Performed at [facility], [address], [place], [place] [address]  Comprehensive metabolic panel     Status: Abnormal   Collection Time: 08/30/18  5:25 AM  Result Value Ref Range   Sodium 137 135 - 145 mmol/L   Potassium 3.2 (L) 3.5 - 5.1 mmol/L   Chloride 107 98 - 111 mmol/L   CO2 24 22 - 32 mmol/L   Glucose, Bld 122 (H) 70 - 99 mg/dL   BUN 5 (L) 8 - 23 mg/dL   Creatinine, Ser 0.72 0.44 - 1.00 mg/dL   Calcium 7.7 (L) 8.9 - 10.3 mg/dL   Total Protein 5.5 (L) 6.5 - 8.1 g/dL   Albumin 2.1 (L) 3.5 - 5.0 g/dL   AST 11 (L) 15 - 41 U/L   ALT 12 0 - 44 U/L   Alkaline Phosphatase 51 38 - 126 U/L   Total Bilirubin 0.7 0.3 - 1.2 mg/dL   GFR calc non Af Amer >60 >60 mL/min   GFR calc Af Amer >60 >60 mL/min   Anion gap 6 5 - 15    Comment: Performed at [facility], [address], [place], [place] [address]    ABGS No results for input(s): PHART, PO2ART, TCO2, HCO3 in the last 72 hours.  Invalid input(s): PCO2 CULTURES Recent Results (from the past 240 hour(s))  C difficile quick scan w PCR reflex     Status: None   Collection Time: 08/22/18  5:04 PM  Result Value Ref Range Status   C Diff antigen NEGATIVE NEGATIVE Final   C Diff toxin NEGATIVE NEGATIVE Final   C Diff interpretation No C. difficile detected.  Final    Comment: Performed at [facility], [place] [address], [place], [place] [address]  Gastrointestinal Panel by PCR , Stool     Status: None   Collection Time:  08/22/18  5:04 PM  Result Value Ref Range Status   Campylobacter species NOT DETECTED NOT DETECTED Final   Plesimonas shigelloides NOT DETECTED NOT DETECTED Final   Salmonella species NOT DETECTED NOT DETECTED Final   Yersinia enterocolitica NOT DETECTED NOT DETECTED Final   Vibrio species NOT DETECTED NOT DETECTED Final   Vibrio cholerae NOT DETECTED NOT DETECTED Final   Enteroaggregative E coli (EAEC) NOT DETECTED NOT DETECTED Final   Enteropathogenic E coli (EPEC) NOT DETECTED NOT DETECTED Final   Enterotoxigenic E coli (ETEC) NOT DETECTED NOT DETECTED  Final   Shiga like toxin producing E coli (STEC) NOT DETECTED NOT DETECTED Final   Shigella/Enteroinvasive E coli (EIEC) NOT DETECTED NOT DETECTED Final   Cryptosporidium NOT DETECTED NOT DETECTED Final   Cyclospora cayetanensis NOT DETECTED NOT DETECTED Final   Entamoeba histolytica NOT DETECTED NOT DETECTED Final   Giardia lamblia NOT DETECTED NOT DETECTED Final   Adenovirus F40/41 NOT DETECTED NOT DETECTED Final   Astrovirus NOT DETECTED NOT DETECTED Final   Norovirus GI/GII NOT DETECTED NOT DETECTED Final   Rotavirus A NOT DETECTED NOT DETECTED Final   Sapovirus (I, II, IV, and V) NOT DETECTED NOT DETECTED Final    Comment: Performed at  Hospital, [address] [place], [place] [address]   Studies/Results: Ct Abdomen Pelvis W Contrast  Result Date: 08/29/2018 CLINICAL DATA:  73 year old female with abdominal pain. History of recent colitis. EXAM: CT ABDOMEN AND PELVIS WITH CONTRAST TECHNIQUE: Multidetector CT imaging of the abdomen and pelvis was performed using the standard protocol following bolus administration of intravenous contrast. CONTRAST:  43m ISOVUE-300 IOPAMIDOL (ISOVUE-300) INJECTION 61%, 1041mISOVUE-300 IOPAMIDOL (ISOVUE-300) INJECTION 61% COMPARISON:  CT of the abdomen pelvis dated 08/22/2018 FINDINGS: Lower chest: Minimal bibasilar dependent atelectatic changes. The visualized lung bases are otherwise  clear. No intra-abdominal free air. Small free fluid within the pelvis. Hepatobiliary: Subcentimeter hypodense focus in the right lobe of the liver is too small to characterize. The liver is otherwise unremarkable. Mild intrahepatic biliary ductal dilatation, likely post cholecystectomy. Pancreas: Unremarkable. No pancreatic ductal dilatation or surrounding inflammatory changes. Spleen: Normal in size without focal abnormality. Adrenals/Urinary Tract: Indeterminate 2 cm left adrenal nodule. The right adrenal gland is unremarkable. Mild fullness of the renal collecting systems bilaterally. There is symmetric enhancement and excretion of contrast by both kidneys. There is a right extrarenal pelvis. The visualized ureters and urinary bladder appear unremarkable. Stomach/Bowel: There is extensive diverticulosis of the descending and sigmoid colon. There is diffuse thickening and inflammatory changes of the descending and sigmoid colon most compatible with colitis. The degree of inflammation is similar or slightly increased since the prior CT. There is postsurgical changes of Nissen fundoplication. No bowel obstruction. The appendix is normal. Vascular/Lymphatic: Mild aortoiliac atherosclerotic disease. No portal venous gas. There is no adenopathy. Reproductive: The uterus is retroflexed. There is thickened appearance of the endometrium measuring approximately 9 mm. Further evaluation with pelvic ultrasound on a nonemergent basis recommended. No pelvic mass. Other: None Musculoskeletal: Osteopenia with degenerative changes of the spine. No acute osseous pathology. Bilateral hip arthritic changes. IMPRESSION: 1. Findings most consistent with colitis involving the descending and sigmoid colon similar or slightly worsened since the prior CT. 2. Extensive diverticulosis of the descending and sigmoid colon. No bowel obstruction. Normal appendix. 3. Thickened appearance of the endometrium. Further evaluation with pelvic  ultrasound on a nonemergent basis recommended. Electronically Signed   By: ArAnner Crete.D.   On: 08/29/2018 02:05    Medications:  Prior to Admission:  Medications Prior to Admission  Medication Sig Dispense Refill Last Dose  . ALPRAZolam (XANAX) 0.5 MG tablet Take 0.5 mg by mouth at bedtime.    08/27/2018 at Unknown time  . amitriptyline (ELAVIL) 25 MG tablet Take 25 mg by mouth every morning.    08/28/2018 at Unknown time  . amoxicillin-clavulanate (AUGMENTIN) 875-125 MG tablet Take 1 tablet by mouth every 12 (twelve) hours. 14 tablet 0 08/28/2018 at 2000  . cholecalciferol (VITAMIN D3) 25 MCG (1000 UT) tablet Take 1,000 Units by mouth daily.  08/28/2018 at Unknown time  . levothyroxine (SYNTHROID, LEVOTHROID) 25 MCG tablet Take 25 mcg by mouth daily.     08/28/2018 at Unknown time  . Multiple Vitamins-Minerals (MULTIVITAMIN ADULT PO) Take 1 tablet by mouth daily.   08/28/2018 at Unknown time  . pantoprazole (PROTONIX) 40 MG tablet Take 40 mg by mouth daily.    08/27/2018 at Unknown time  . potassium chloride SA (K-DUR,KLOR-CON) 20 MEQ tablet Take 1 tablet (20 mEq total) by mouth daily. 4 tablet 0 08/28/2018 at Unknown time  . Thiamine HCl (VITAMIN B-1 PO) Take 1 tablet by mouth daily.   08/28/2018 at Unknown time  . traZODone (DESYREL) 50 MG tablet Take 1 tablet by mouth at bedtime.   Past Week at Unknown time   Scheduled: . amitriptyline  25 mg Oral Daily  . cholecalciferol  1,000 Units Oral Daily  . hydrocortisone   Rectal QID  . levothyroxine  25 mcg Oral Q0600  . magic mouthwash  5 mL Oral TID PC & HS  . multivitamin with minerals  1 tablet Oral Daily  . pantoprazole  40 mg Oral Daily  . potassium chloride  20 mEq Oral BID  . traZODone  50 mg Oral QHS   Continuous: . sodium chloride 100 mL/hr at 08/30/18 0814  . ciprofloxacin 400 mg (08/30/18 0815)  . metronidazole 500 mg (08/30/18 0947)   SJG:[id number] **OR** acetaminophen, loperamide, ondansetron **OR** ondansetron  (ZOFRAN) IV, traMADol  Assesment: She was admitted with colitis.  She has substantial diarrhea from that.  She is still having loose stools.  She has had some confusion that may be related to medications.  She had been hypokalemic but it was improved.  She now has hypokalemia again.  She had leukocytosis which is better  Her dehydration is better  She has abnormal uterus on CT and that will need further evaluation Principal Problem:   Colitis Active Problems:   Adrenal nodule (HCC)   Hypokalemia   Dehydration with hyponatremia   Leukocytosis   Anemia   Insomnia   Abnormal CT of the abdomen   Diarrhea of infectious origin    Plan: Discontinue amitriptyline.  Cut the dose of tramadol.  Continue IV fluids.  Replace potassium.  GI consult noted and appreciated and she may require some sort of endoscopy procedure during her hospitalization    LOS: 0 days   Alonza Bogus 08/30/2018, 8:14 AM

## 2018-08-30 NOTE — Progress Notes (Signed)
Subjective: Had about 9 liquid stools overnight last night. Rectum still with some discomfort but Anusol helped. Abdominal pain remains at [place]. No N/V. Tolerating a soft diet today. No other GI complaints.  Objective: Vital signs in last 24 hours: Temp:  [98 F (36.7 C)-99.2 F (37.3 C)] 98.2 F (36.8 C) (01/24 0539) Pulse Rate:  [83-88] 83 (01/24 0539) Resp:  [18-24] 24 (01/24 0539) BP: (121-133)/(42-50) 133/50 (01/24 0539) SpO2:  [86 %-96 %] 94 % (01/24 0539) Weight:  [85 kg] 85 kg (01/23 1252) Last BM Date: 08/29/18 General:   Alert and oriented, pleasant Head:  Normocephalic and atraumatic. Eyes:  No icterus, sclera clear. Conjuctiva pink.  Heart:  S1, S2 present, no murmurs noted.  Lungs: Clear to auscultation bilaterally, without wheezing, rales, or rhonchi.  Abdomen:  Bowel sounds present, soft, non-tender, non-distended. No HSM or hernias noted. No rebound or guarding. Msk:  Symmetrical without gross deformities. Pulses:  Normal bilateral DP pulses noted. Extremities:  Without clubbing or edema. Neurologic:  Alert and  oriented x4;  grossly normal neurologically. Skin:  Warm and dry, intact without significant lesions.  Psych:  Alert and cooperative. Normal mood and affect.  Intake/Output from previous day: 01/23 0701 - 01/24 0700 In: 2594.2 [P.O.:240; I.V.:1656; IV Piggyback:698.3] Out: -  Intake/Output this shift: No intake/output data recorded.  Lab Results: Recent Labs    08/28/18 2112 08/29/18 0527 08/30/18 0525  WBC 15.5* 17.6* 12.7*  HGB 10.4* 9.8* 8.6*  HCT 35.7* 32.9* 29.9*  PLT 517* 494* 461*   BMET Recent Labs    08/28/18 2112 08/29/18 0527 08/30/18 0525  NA 129* 136 137  K 3.2* 3.5 3.2*  CL 94* 102 107  CO2 24 24 24   GLUCOSE 132* 132* 122*  BUN 8 7* 5*  CREATININE 0.76 0.70 0.72  CALCIUM 8.1* 7.6* 7.7*   LFT Recent Labs    08/28/18 2112 08/30/18 0525  PROT 6.9 5.5*  ALBUMIN 2.9* 2.1*  AST 15 11*  ALT 17 12  ALKPHOS 64 51   BILITOT 0.3 0.7   PT/INR No results for input(s): LABPROT, INR in the last 72 hours. Hepatitis Panel No results for input(s): HEPBSAG, HCVAB, HEPAIGM, HEPBIGM in the last 72 hours.   Studies/Results: Ct Abdomen Pelvis W Contrast  Result Date: 08/29/2018 CLINICAL DATA:  73 year old female with abdominal pain. History of recent colitis. EXAM: CT ABDOMEN AND PELVIS WITH CONTRAST TECHNIQUE: Multidetector CT imaging of the abdomen and pelvis was performed using the standard protocol following bolus administration of intravenous contrast. CONTRAST:  78mL ISOVUE-300 IOPAMIDOL (ISOVUE-300) INJECTION 61%, 179mL ISOVUE-300 IOPAMIDOL (ISOVUE-300) INJECTION 61% COMPARISON:  CT of the abdomen pelvis dated 08/22/2018 FINDINGS: Lower chest: Minimal bibasilar dependent atelectatic changes. The visualized lung bases are otherwise clear. No intra-abdominal free air. Small free fluid within the pelvis. Hepatobiliary: Subcentimeter hypodense focus in the right lobe of the liver is too small to characterize. The liver is otherwise unremarkable. Mild intrahepatic biliary ductal dilatation, likely post cholecystectomy. Pancreas: Unremarkable. No pancreatic ductal dilatation or surrounding inflammatory changes. Spleen: Normal in size without focal abnormality. Adrenals/Urinary Tract: Indeterminate 2 cm left adrenal nodule. The right adrenal gland is unremarkable. Mild fullness of the renal collecting systems bilaterally. There is symmetric enhancement and excretion of contrast by both kidneys. There is a right extrarenal pelvis. The visualized ureters and urinary bladder appear unremarkable. Stomach/Bowel: There is extensive diverticulosis of the descending and sigmoid colon. There is diffuse thickening and inflammatory changes of the descending and sigmoid  colon most compatible with colitis. The degree of inflammation is similar or slightly increased since the prior CT. There is postsurgical changes of Nissen fundoplication.  No bowel obstruction. The appendix is normal. Vascular/Lymphatic: Mild aortoiliac atherosclerotic disease. No portal venous gas. There is no adenopathy. Reproductive: The uterus is retroflexed. There is thickened appearance of the endometrium measuring approximately 9 mm. Further evaluation with pelvic ultrasound on a nonemergent basis recommended. No pelvic mass. Other: None Musculoskeletal: Osteopenia with degenerative changes of the spine. No acute osseous pathology. Bilateral hip arthritic changes. IMPRESSION: 1. Findings most consistent with colitis involving the descending and sigmoid colon similar or slightly worsened since the prior CT. 2. Extensive diverticulosis of the descending and sigmoid colon. No bowel obstruction. Normal appendix. 3. Thickened appearance of the endometrium. Further evaluation with pelvic ultrasound on a nonemergent basis recommended. Electronically Signed   By: Anner Crete M.D.   On: 08/29/2018 02:05    Assessment: Pleasant 73 year old female who is been battling 2 weeks of diarrhea.  She was previously seen in the ER a couple days ago with CT imaging suspicious for descending and sigmoid colitis.  She was given Augmentin and sent home.  She returned to the ER yesterday with worsening symptoms.  CT reimaging found stable or slightly worsening colitis as per previous CT.  She was having multiple stools a day.  Hemorrhoid flareup due to frequent diarrhea with rectal exam finding 3 engorged hemorrhoids and 2/3 ulcerated. Her white blood cell count was elevated.  Her short-term iron and iron saturations were low, ferritin normal.  She was admitted on IV fluids and electrolyte replacement as well as pain medication and IV Cipro and Flagyl.  Had some improvement yesterday with pasty stools (vs diarrhea) and significant decrease in abdominal pain. However last night she began having diarrhea/liquid stools (9 overnight). Abdominal pain remains improved.  Today she did have some  decline in hgb to 8.6 (likely hydration effect contributing) and WBC count improved to 12.7. Some recurrent hypokalemia. She was offered flex sig versus colonoscopy in a few days pending progress.   She has never had a colonoscopy  Plan: 1. Continue antibiotics 2. Continue soft diet 3. Add probiotics 4. Monitor for acute changes 5. Can consider flex sig vs colonoscopy in 1-2 days pending clinical progress 6. Supportive measures   Thank you for allowing [place] to participate in the care of [place], DNP, AGNP-C Adult & Gerontological Nurse Practitioner [facility]     LOS: 0 days    08/30/2018, 8:50 AM

## 2018-08-31 DIAGNOSIS — Z79899 Other long term (current) drug therapy: Secondary | ICD-10-CM | POA: Diagnosis not present

## 2018-08-31 DIAGNOSIS — E871 Hypo-osmolality and hyponatremia: Secondary | ICD-10-CM | POA: Diagnosis present

## 2018-08-31 DIAGNOSIS — R948 Abnormal results of function studies of other organs and systems: Secondary | ICD-10-CM | POA: Diagnosis present

## 2018-08-31 DIAGNOSIS — E039 Hypothyroidism, unspecified: Secondary | ICD-10-CM | POA: Diagnosis present

## 2018-08-31 DIAGNOSIS — K5730 Diverticulosis of large intestine without perforation or abscess without bleeding: Secondary | ICD-10-CM

## 2018-08-31 DIAGNOSIS — A09 Infectious gastroenteritis and colitis, unspecified: Secondary | ICD-10-CM | POA: Diagnosis present

## 2018-08-31 DIAGNOSIS — E278 Other specified disorders of adrenal gland: Secondary | ICD-10-CM | POA: Diagnosis present

## 2018-08-31 DIAGNOSIS — Z79890 Hormone replacement therapy (postmenopausal): Secondary | ICD-10-CM | POA: Diagnosis not present

## 2018-08-31 DIAGNOSIS — K529 Noninfective gastroenteritis and colitis, unspecified: Secondary | ICD-10-CM | POA: Diagnosis not present

## 2018-08-31 DIAGNOSIS — E860 Dehydration: Secondary | ICD-10-CM | POA: Diagnosis present

## 2018-08-31 DIAGNOSIS — Z9049 Acquired absence of other specified parts of digestive tract: Secondary | ICD-10-CM | POA: Diagnosis not present

## 2018-08-31 DIAGNOSIS — G4700 Insomnia, unspecified: Secondary | ICD-10-CM | POA: Diagnosis present

## 2018-08-31 DIAGNOSIS — E876 Hypokalemia: Secondary | ICD-10-CM | POA: Diagnosis present

## 2018-08-31 DIAGNOSIS — D509 Iron deficiency anemia, unspecified: Secondary | ICD-10-CM | POA: Diagnosis present

## 2018-08-31 DIAGNOSIS — F419 Anxiety disorder, unspecified: Secondary | ICD-10-CM | POA: Diagnosis present

## 2018-08-31 DIAGNOSIS — R9389 Abnormal findings on diagnostic imaging of other specified body structures: Secondary | ICD-10-CM | POA: Diagnosis present

## 2018-08-31 DIAGNOSIS — R197 Diarrhea, unspecified: Secondary | ICD-10-CM | POA: Diagnosis present

## 2018-08-31 DIAGNOSIS — K644 Residual hemorrhoidal skin tags: Secondary | ICD-10-CM | POA: Diagnosis present

## 2018-08-31 LAB — BASIC METABOLIC PANEL
Anion gap: 9 (ref 5–15)
BUN: <5 mg/dL — ABNORMAL LOW (ref 8–23)
CO2: 21 mmol/L — ABNORMAL LOW (ref 22–32)
Calcium: 7.7 mg/dL — ABNORMAL LOW (ref 8.9–10.3)
Chloride: 107 mmol/L (ref 98–111)
Creatinine, Ser: 0.68 mg/dL (ref 0.44–1.00)
GFR calc Af Amer: >60 mL/min (ref >60)
GFR calc non Af Amer: >60 mL/min (ref >60)
Glucose, Bld: 116 mg/dL — ABNORMAL HIGH (ref 70–99)
Potassium: 3.5 mmol/L (ref 3.5–5.1)
Sodium: 137 mmol/L (ref 135–145)

## 2018-08-31 LAB — CBC WITH DIFFERENTIAL/PLATELET
Abs Immature Granulocytes: 0.17 10*3/uL — ABNORMAL HIGH (ref 0.00–0.07)
Basophils Absolute: 0.1 10*3/uL (ref 0.0–0.1)
Basophils Relative: 1 %
Eosinophils Absolute: 0.9 10*3/uL — ABNORMAL HIGH (ref 0.0–0.5)
Eosinophils Relative: 7 %
HCT: 31.4 % — ABNORMAL LOW (ref 36.0–46.0)
Hemoglobin: 8.9 g/dL — ABNORMAL LOW (ref 12.0–15.0)
Immature Granulocytes: 1 %
Lymphocytes Relative: 15 %
Lymphs Abs: 1.8 10*3/uL (ref 0.7–4.0)
MCH: 20.9 pg — ABNORMAL LOW (ref 26.0–34.0)
MCHC: 28.3 g/dL — ABNORMAL LOW (ref 30.0–36.0)
MCV: 73.7 fL — ABNORMAL LOW (ref 80.0–100.0)
Monocytes Absolute: 0.9 10*3/uL (ref 0.1–1.0)
Monocytes Relative: 8 %
Neutro Abs: 8.3 10*3/uL — ABNORMAL HIGH (ref 1.7–7.7)
Neutrophils Relative %: 68 %
Platelets: 490 10*3/uL — ABNORMAL HIGH (ref 150–400)
RBC: 4.26 MIL/uL (ref 3.87–5.11)
RDW: 18.6 % — ABNORMAL HIGH (ref 11.5–15.5)
WBC: 12.1 10*3/uL — ABNORMAL HIGH (ref 4.0–10.5)
nRBC: 0 % (ref 0.0–0.2)

## 2018-08-31 LAB — MAGNESIUM: Magnesium: 2 mg/dL (ref 1.7–2.4)

## 2018-08-31 NOTE — Progress Notes (Signed)
Patient continues to have multiple nonbloody watery bowel movements day and night.  No apparent improvement.  Acute onset 3 weeks ago. C. difficile assay and GIP negative.  Left-sided colonic wall thickening on recent CT Fecal DNA test reportedly positive as an outpatient.  No prior colonoscopy. No sick contacts, no unusual travel.  60 foot well at home.  Does not drink the water. No change in medications recently.  No family history of inflammatory bowel disease or sprue.   Vital signs in last 24 hours: Temp:  [98.5 F (36.9 C)] 98.5 F (36.9 C) (01/24 2203) Pulse Rate:  [80-85] 80 (01/25 0549) Resp:  [16-18] 16 (01/25 0549) BP: (136-139)/(53-63) 139/63 (01/25 0549) SpO2:  [94 %-97 %] 97 % (01/25 0549) Last BM Date: 08/30/18 General:   Alert, somewhat uncomfortable appearing.  Does not appear toxic.  Accompanied by 2 daughters.   Abdomen: Nondistended.  Positive bowel sounds minimal diffuse tenderness to palpation.   Extremities:  Without clubbing or edema.    Intake/Output from previous day: 01/24 0701 - 01/25 0700 In: 1531.9 [P.O.:480; I.V.:676.7; IV Piggyback:375.2] Out: -  Intake/Output this shift: Total I/O In: 240 [P.O.:240] Out: -   Lab Results: Recent Labs    08/29/18 0527 08/30/18 0525 08/31/18 0702  WBC 17.6* 12.7* 12.1*  HGB 9.8* 8.6* 8.9*  HCT 32.9* 29.9* 31.4*  PLT 494* 461* 490*   BMET Recent Labs    08/29/18 0527 08/30/18 0525 08/31/18 0702  NA 136 137 137  K 3.5 3.2* 3.5  CL 102 107 107  CO2 24 24 21*  GLUCOSE 132* 122* 116*  BUN 7* 5* <5*  CREATININE 0.70 0.72 0.68  CALCIUM 7.6* 7.7* 7.7*   LFT Recent Labs    08/30/18 0525  PROT 5.5*  ALBUMIN 2.1*  AST 11*  ALT 12  ALKPHOS 51  BILITOT 0.7   PT/INR No results for input(s): LABPROT, INR in the last 72 hours. Hepatitis Panel No results for input(s): HEPBSAG, HCVAB, HEPAIGM, HEPBIGM in the last 72 hours. C-Diff No results for input(s): CDIFFTOX in the last 72  hours.  Studies/Results: No results found.  Assessment: Principal Problem:   Colitis Active Problems:   Adrenal nodule (HCC)   Hypokalemia   Dehydration with hyponatremia   Leukocytosis   Anemia   Insomnia   Abnormal CT of the abdomen   Diarrhea of infectious origin   This lady has had a significant diarrheal illness of 3 weeks duration.  Stool studies came back negative.  Abnormal left colon on cross-sectional imaging No improvement on a course of outpatient (and now inpatient) antibiotics. Reported positive fecal DNA test.  This lady should go ahead and have a ileocolonoscopy with segmental biopsies at a minimum. The risks, benefits, limitations, alternatives and imponderables have been reviewed with the patient and 2 daughters. Questions have been answered. All parties are agreeable.   We will plan to perform on January 27-we will utilize propofol.  Further recommendations to follow.

## 2018-08-31 NOTE — Progress Notes (Signed)
Subjective: She says she feels a little bit better.  She has no new complaints.  Her breathing is okay.  She still has some abdominal tenderness.  She is still having diarrhea.  Her hemorrhoids are still inflamed but better  Objective: Vital signs in last 24 hours: Temp:  [98.5 F (36.9 C)] 98.5 F (36.9 C) (01/24 2203) Pulse Rate:  [80-85] 80 (01/25 0549) Resp:  [16-18] 16 (01/25 0549) BP: (136-139)/(53-63) 139/63 (01/25 0549) SpO2:  [94 %-97 %] 97 % (01/25 0549) Weight change:  Last BM Date: 08/30/18  Intake/Output from previous day: 01/24 0701 - 01/25 0700 In: 1531.9 [P.O.:480; I.V.:676.7; IV Piggyback:375.2] Out: -   PHYSICAL EXAM General appearance: alert, cooperative and mild distress Resp: clear to auscultation bilaterally Cardio: regular rate and rhythm, S1, S2 normal, no murmur, click, rub or gallop GI: She has minimal tenderness Extremities: extremities normal, atraumatic, no cyanosis or edema  Lab Results:  Results for orders placed or performed during the hospital encounter of 08/28/18 (from the past 48 hour(s))  CBC with Differential/Platelet     Status: Abnormal   Collection Time: 08/30/18  5:25 AM  Result Value Ref Range   WBC 12.7 (H) 4.0 - 10.5 K/uL   RBC 4.08 3.87 - 5.11 MIL/uL   Hemoglobin 8.6 (L) 12.0 - 15.0 g/dL    Comment: Reticulocyte Hemoglobin testing may be clinically indicated, consider ordering this additional test [id number]    HCT 29.9 (L) 36.0 - 46.0 %   MCV 73.3 (L) 80.0 - 100.0 fL   MCH 21.1 (L) 26.0 - 34.0 pg   MCHC 28.8 (L) 30.0 - 36.0 g/dL   RDW 18.2 (H) 11.5 - 15.5 %   Platelets 461 (H) 150 - 400 K/uL   nRBC 0.0 0.0 - 0.2 %   Neutrophils Relative % 67 %   Neutro Abs 8.5 (H) 1.7 - 7.7 K/uL   Lymphocytes Relative 18 %   Lymphs Abs 2.3 0.7 - 4.0 K/uL   Monocytes Relative 9 %   Monocytes Absolute 1.2 (H) 0.1 - 1.0 K/uL   Eosinophils Relative 4 %   Eosinophils Absolute 0.5 0.0 - 0.5 K/uL   Basophils Relative 1 %   Basophils  Absolute 0.1 0.0 - 0.1 K/uL   Immature Granulocytes 1 %   Abs Immature Granulocytes 0.12 (H) 0.00 - 0.07 K/uL    Comment: Performed at [facility], [address] [place], [place] [address]  Comprehensive metabolic panel     Status: Abnormal   Collection Time: 08/30/18  5:25 AM  Result Value Ref Range   Sodium 137 135 - 145 mmol/L   Potassium 3.2 (L) 3.5 - 5.1 mmol/L   Chloride 107 98 - 111 mmol/L   CO2 24 22 - 32 mmol/L   Glucose, Bld 122 (H) 70 - 99 mg/dL   BUN 5 (L) 8 - 23 mg/dL   Creatinine, Ser 0.72 0.44 - 1.00 mg/dL   Calcium 7.7 (L) 8.9 - 10.3 mg/dL   Total Protein 5.5 (L) 6.5 - 8.1 g/dL   Albumin 2.1 (L) 3.5 - 5.0 g/dL   AST 11 (L) 15 - 41 U/L   ALT 12 0 - 44 U/L   Alkaline Phosphatase 51 38 - 126 U/L   Total Bilirubin 0.7 0.3 - 1.2 mg/dL   GFR calc non Af Amer >60 >60 mL/min   GFR calc Af Amer >60 >60 mL/min   Anion gap 6 5 - 15    Comment: Performed at [facility],  [address] [place], [place] [address]  CBC with Differential/Platelet     Status: Abnormal   Collection Time: 08/31/18  7:02 AM  Result Value Ref Range   WBC 12.1 (H) 4.0 - 10.5 K/uL   RBC 4.26 3.87 - 5.11 MIL/uL   Hemoglobin 8.9 (L) 12.0 - 15.0 g/dL    Comment: Reticulocyte Hemoglobin testing may be clinically indicated, consider ordering this additional test [id number]    HCT 31.4 (L) 36.0 - 46.0 %   MCV 73.7 (L) 80.0 - 100.0 fL   MCH 20.9 (L) 26.0 - 34.0 pg   MCHC 28.3 (L) 30.0 - 36.0 g/dL   RDW 18.6 (H) 11.5 - 15.5 %   Platelets 490 (H) 150 - 400 K/uL   nRBC 0.0 0.0 - 0.2 %   Neutrophils Relative % 68 %   Neutro Abs 8.3 (H) 1.7 - 7.7 K/uL   Lymphocytes Relative 15 %   Lymphs Abs 1.8 0.7 - 4.0 K/uL   Monocytes Relative 8 %   Monocytes Absolute 0.9 0.1 - 1.0 K/uL   Eosinophils Relative 7 %   Eosinophils Absolute 0.9 (H) 0.0 - 0.5 K/uL   Basophils Relative 1 %   Basophils Absolute 0.1 0.0 - 0.1 K/uL   Immature Granulocytes 1 %   Abs Immature Granulocytes 0.17 (H) 0.00 - 0.07 K/uL     Comment: Performed at [facility], [address], [place], [place] [address]  Basic metabolic panel     Status: Abnormal   Collection Time: 08/31/18  7:02 AM  Result Value Ref Range   Sodium 137 135 - 145 mmol/L   Potassium 3.5 3.5 - 5.1 mmol/L   Chloride 107 98 - 111 mmol/L   CO2 21 (L) 22 - 32 mmol/L   Glucose, Bld 116 (H) 70 - 99 mg/dL   BUN <5 (L) 8 - 23 mg/dL   Creatinine, Ser 0.68 0.44 - 1.00 mg/dL   Calcium 7.7 (L) 8.9 - 10.3 mg/dL   GFR calc non Af Amer >60 >60 mL/min   GFR calc Af Amer >60 >60 mL/min   Anion gap 9 5 - 15    Comment: Performed at [name], [address] [place], [place] [address]  Magnesium     Status: None   Collection Time: 08/31/18  7:02 AM  Result Value Ref Range   Magnesium 2.0 1.7 - 2.4 mg/dL    Comment: Performed at [facility], [address] [place], [place] [address]    ABGS No results for input(s): PHART, PO2ART, TCO2, HCO3 in the last 72 hours.  Invalid input(s): PCO2 CULTURES Recent Results (from the past 240 hour(s))  C difficile quick scan w PCR reflex     Status: None   Collection Time: 08/22/18  5:04 PM  Result Value Ref Range Status   C Diff antigen NEGATIVE NEGATIVE Final   C Diff toxin NEGATIVE NEGATIVE Final   C Diff interpretation No C. difficile detected.  Final    Comment: Performed at [facility], [place] [address] [place], [place] [address]  Gastrointestinal Panel by PCR , Stool     Status: None   Collection Time: 08/22/18  5:04 PM  Result Value Ref Range Status   Campylobacter species NOT DETECTED NOT DETECTED Final   Plesimonas shigelloides NOT DETECTED NOT DETECTED Final   Salmonella species NOT DETECTED NOT DETECTED Final   Yersinia enterocolitica NOT DETECTED NOT DETECTED Final   Vibrio species NOT DETECTED NOT DETECTED Final  Vibrio cholerae NOT DETECTED NOT DETECTED Final   Enteroaggregative E coli (EAEC) NOT DETECTED NOT DETECTED Final   Enteropathogenic E coli (EPEC) NOT DETECTED NOT  DETECTED Final   Enterotoxigenic E coli (ETEC) NOT DETECTED NOT DETECTED Final   Shiga like toxin producing E coli (STEC) NOT DETECTED NOT DETECTED Final   Shigella/Enteroinvasive E coli (EIEC) NOT DETECTED NOT DETECTED Final   Cryptosporidium NOT DETECTED NOT DETECTED Final   Cyclospora cayetanensis NOT DETECTED NOT DETECTED Final   Entamoeba histolytica NOT DETECTED NOT DETECTED Final   Giardia lamblia NOT DETECTED NOT DETECTED Final   Adenovirus F40/41 NOT DETECTED NOT DETECTED Final   Astrovirus NOT DETECTED NOT DETECTED Final   Norovirus GI/GII NOT DETECTED NOT DETECTED Final   Rotavirus A NOT DETECTED NOT DETECTED Final   Sapovirus (I, II, IV, and V) NOT DETECTED NOT DETECTED Final    Comment: Performed at [facility], [address] [place], [place] [address]   Studies/Results: No results found.  Medications:  Prior to Admission:  Medications Prior to Admission  Medication Sig Dispense Refill Last Dose  . ALPRAZolam (XANAX) 0.5 MG tablet Take 0.5 mg by mouth at bedtime.    08/27/2018 at Unknown time  . amitriptyline (ELAVIL) 25 MG tablet Take 25 mg by mouth every morning.    08/28/2018 at Unknown time  . amoxicillin-clavulanate (AUGMENTIN) 875-125 MG tablet Take 1 tablet by mouth every 12 (twelve) hours. 14 tablet 0 08/28/2018 at 2000  . cholecalciferol (VITAMIN D3) 25 MCG (1000 UT) tablet Take 1,000 Units by mouth daily.   08/28/2018 at Unknown time  . levothyroxine (SYNTHROID, LEVOTHROID) 25 MCG tablet Take 25 mcg by mouth daily.     08/28/2018 at Unknown time  . Multiple Vitamins-Minerals (MULTIVITAMIN ADULT PO) Take 1 tablet by mouth daily.   08/28/2018 at Unknown time  . pantoprazole (PROTONIX) 40 MG tablet Take 40 mg by mouth daily.    08/27/2018 at Unknown time  . potassium chloride SA (K-DUR,KLOR-CON) 20 MEQ tablet Take 1 tablet (20 mEq total) by mouth daily. 4 tablet 0 08/28/2018 at Unknown time  . Thiamine HCl (VITAMIN B-1 PO) Take 1 tablet by mouth daily.    08/28/2018 at Unknown time  . traZODone (DESYREL) 50 MG tablet Take 1 tablet by mouth at bedtime.   Past Week at Unknown time   Scheduled: . cholecalciferol  1,000 Units Oral Daily  . hydrocortisone   Rectal QID  . levothyroxine  25 mcg Oral Q0600  . loperamide  2 mg Oral BID  . magic mouthwash  5 mL Oral TID PC & HS  . multivitamin with minerals  1 tablet Oral Daily  . pantoprazole  40 mg Oral Daily  . potassium chloride  20 mEq Oral BID  . saccharomyces boulardii  250 mg Oral BID  . traZODone  50 mg Oral QHS   Continuous: . sodium chloride 100 mL/hr at 08/31/18 0527  . ciprofloxacin 400 mg (08/31/18 0920)  . metronidazole 500 mg (08/31/18 0537)   JSH:[id number] **OR** acetaminophen, loperamide, ondansetron **OR** ondansetron (ZOFRAN) IV, traMADol  Assesment: She was admitted with colitis.  She was dehydrated and had hyponatremia.  She is still having diarrhea.  She does not appear to be dehydrated now but she is still having diarrhea.  She is on IV fluids but I think we can probably back off on that a little bit.  She has negative GI pathogen panel  She has hypokalemia and her potassium is 3.5 today.  She has hemorrhoids which are still inflamed but better with treatment Principal Problem:   Colitis Active Problems:   Adrenal nodule (HCC)   Hypokalemia   Dehydration with hyponatremia   Leukocytosis   Anemia   Insomnia   Abnormal CT of the abdomen   Diarrhea of infectious origin    Plan: Continue current medications.  Decrease IV fluids.    LOS: 0 days   Rhonda Bailey 08/31/2018, 9:47 AM

## 2018-09-01 MED ORDER — PEG 3350-KCL-NA BICARB-NACL 420 G PO SOLR
4000.0000 mL | Freq: Once | ORAL | Status: AC
  Administered 2018-09-01: 4000 mL via ORAL
  Filled 2018-09-01: qty 4000

## 2018-09-01 NOTE — Progress Notes (Signed)
Patient continues to have much watery nonbloody diarrhea without any discernible improvement.  Positive fecal DNA test on record.    Vital signs in last 24 hours: Temp:  [98.4 F (36.9 C)-98.7 F (37.1 C)] 98.4 F (36.9 C) (01/26 0525) Pulse Rate:  [77-84] 77 (01/26 0525) Resp:  [16-17] 17 (01/26 0525) BP: (114-132)/(49-58) 119/58 (01/26 0525) SpO2:  [92 %-95 %] 92 % (01/26 0525) Last BM Date: 08/31/18 General:   Alert,   pleasant and cooperative in NAD; accompanied by multiple family members. Abdomen:  Soft, nontender and nondistended.  Normal bowel sounds, without guarding, and without rebound.  No mass or organomegaly. Extremities:  Without clubbing or edema.    Intake/Output from previous day: 01/25 0701 - 01/26 0700 In: 2362.2 [P.O.:720; I.V.:556; IV Piggyback:1086.2] Out: -  Intake/Output this shift: Total I/O In: 240 [P.O.:240] Out: -   Lab Results: Recent Labs    08/30/18 0525 08/31/18 0702  WBC 12.7* 12.1*  HGB 8.6* 8.9*  HCT 29.9* 31.4*  PLT 461* 490*   BMET Recent Labs    08/30/18 0525 08/31/18 0702  NA 137 137  K 3.2* 3.5  CL 107 107  CO2 24 21*  GLUCOSE 122* 116*  BUN 5* <5*  CREATININE 0.72 0.68  CALCIUM 7.7* 7.7*   LFT Recent Labs    08/30/18 0525  PROT 5.5*  ALBUMIN 2.1*  AST 11*  ALT 12  ALKPHOS 51  BILITOT 0.7      Impression: Incessant watery diarrhea with an abnormal appearing left colon on CT.  Positive fecal DNA test.  Recommendations: I have offered the patient an ileocolonoscopy tomorrow with propofol.  The risks, benefits, limitations, alternatives and imponderables have been reviewed with the patient. Questions have been answered. All parties are agreeable.   Further recommendations to follow.

## 2018-09-01 NOTE — Progress Notes (Signed)
Subjective: She is still having diarrhea.  It is mostly watery.  This is causing her to have difficulty with hemorrhoids.  Objective: Vital signs in last 24 hours: Temp:  [98.4 F (36.9 C)-98.7 F (37.1 C)] 98.4 F (36.9 C) (01/26 0525) Pulse Rate:  [77-84] 77 (01/26 0525) Resp:  [16-17] 17 (01/26 0525) BP: (114-132)/(49-58) 119/58 (01/26 0525) SpO2:  [92 %-95 %] 92 % (01/26 0525) Weight change:  Last BM Date: 08/31/18  Intake/Output from previous day: 01/25 0701 - 01/26 0700 In: 2362.2 [P.O.:720; I.V.:556; IV Piggyback:1086.2] Out: -   PHYSICAL EXAM General appearance: alert, cooperative and no distress Resp: clear to auscultation bilaterally Cardio: regular rate and rhythm, S1, S2 normal, no murmur, click, rub or gallop GI: Mild diffuse tenderness.  Bowel sounds are present Extremities: extremities normal, atraumatic, no cyanosis or edema  Lab Results:  Results for orders placed or performed during the hospital encounter of 08/28/18 (from the past 48 hour(s))  CBC with Differential/Platelet     Status: Abnormal   Collection Time: 08/31/18  7:02 AM  Result Value Ref Range   WBC 12.1 (H) 4.0 - 10.5 K/uL   RBC 4.26 3.87 - 5.11 MIL/uL   Hemoglobin 8.9 (L) 12.0 - 15.0 g/dL    Comment: Reticulocyte Hemoglobin testing may be clinically indicated, consider ordering this additional test [id number]    HCT 31.4 (L) 36.0 - 46.0 %   MCV 73.7 (L) 80.0 - 100.0 fL   MCH 20.9 (L) 26.0 - 34.0 pg   MCHC 28.3 (L) 30.0 - 36.0 g/dL   RDW 18.6 (H) 11.5 - 15.5 %   Platelets 490 (H) 150 - 400 K/uL   nRBC 0.0 0.0 - 0.2 %   Neutrophils Relative % 68 %   Neutro Abs 8.3 (H) 1.7 - 7.7 K/uL   Lymphocytes Relative 15 %   Lymphs Abs 1.8 0.7 - 4.0 K/uL   Monocytes Relative 8 %   Monocytes Absolute 0.9 0.1 - 1.0 K/uL   Eosinophils Relative 7 %   Eosinophils Absolute 0.9 (H) 0.0 - 0.5 K/uL   Basophils Relative 1 %   Basophils Absolute 0.1 0.0 - 0.1 K/uL   Immature Granulocytes 1 %   Abs  Immature Granulocytes 0.17 (H) 0.00 - 0.07 K/uL    Comment: Performed at [facility], [address] [place], [place] [address]  Basic metabolic panel     Status: Abnormal   Collection Time: 08/31/18  7:02 AM  Result Value Ref Range   Sodium 137 135 - 145 mmol/L   Potassium 3.5 3.5 - 5.1 mmol/L   Chloride 107 98 - 111 mmol/L   CO2 21 (L) 22 - 32 mmol/L   Glucose, Bld 116 (H) 70 - 99 mg/dL   BUN <5 (L) 8 - 23 mg/dL   Creatinine, Ser 0.68 0.44 - 1.00 mg/dL   Calcium 7.7 (L) 8.9 - 10.3 mg/dL   GFR calc non Af Amer >60 >60 mL/min   GFR calc Af Amer >60 >60 mL/min   Anion gap 9 5 - 15    Comment: Performed at [facility], [address] [place], [place] [address]  Magnesium     Status: None   Collection Time: 08/31/18  7:02 AM  Result Value Ref Range   Magnesium 2.0 1.7 - 2.4 mg/dL    Comment: Performed at [facility], [address], [place], [place] [address]    ABGS No results for input(s): PHART, PO2ART, TCO2, HCO3 in the last 72 hours.  Invalid input(s): PCO2 CULTURES Recent Results (from the past 240 hour(s))  C difficile quick scan w PCR reflex     Status: None   Collection Time: 08/22/18  5:04 PM  Result Value Ref Range Status   C Diff antigen NEGATIVE NEGATIVE Final   C Diff toxin NEGATIVE NEGATIVE Final   C Diff interpretation No C. difficile detected.  Final    Comment: Performed at [facility], [place] [address] [place], [place] [address]  Gastrointestinal Panel by PCR , Stool     Status: None   Collection Time: 08/22/18  5:04 PM  Result Value Ref Range Status   Campylobacter species NOT DETECTED NOT DETECTED Final   Plesimonas shigelloides NOT DETECTED NOT DETECTED Final   Salmonella species NOT DETECTED NOT DETECTED Final   Yersinia enterocolitica NOT DETECTED NOT DETECTED Final   Vibrio species NOT DETECTED NOT DETECTED Final   Vibrio cholerae NOT DETECTED NOT DETECTED Final   Enteroaggregative E coli (EAEC) NOT DETECTED NOT DETECTED Final    Enteropathogenic E coli (EPEC) NOT DETECTED NOT DETECTED Final   Enterotoxigenic E coli (ETEC) NOT DETECTED NOT DETECTED Final   Shiga like toxin producing E coli (STEC) NOT DETECTED NOT DETECTED Final   Shigella/Enteroinvasive E coli (EIEC) NOT DETECTED NOT DETECTED Final   Cryptosporidium NOT DETECTED NOT DETECTED Final   Cyclospora cayetanensis NOT DETECTED NOT DETECTED Final   Entamoeba histolytica NOT DETECTED NOT DETECTED Final   Giardia lamblia NOT DETECTED NOT DETECTED Final   Adenovirus F40/41 NOT DETECTED NOT DETECTED Final   Astrovirus NOT DETECTED NOT DETECTED Final   Norovirus GI/GII NOT DETECTED NOT DETECTED Final   Rotavirus A NOT DETECTED NOT DETECTED Final   Sapovirus (I, II, IV, and V) NOT DETECTED NOT DETECTED Final    Comment: Performed at [facility], [address], [place], [place] [address]   Studies/Results: No results found.  Medications:  Prior to Admission:  Medications Prior to Admission  Medication Sig Dispense Refill Last Dose  . ALPRAZolam (XANAX) 0.5 MG tablet Take 0.5 mg by mouth at bedtime.    08/27/2018 at Unknown time  . amitriptyline (ELAVIL) 25 MG tablet Take 25 mg by mouth every morning.    08/28/2018 at Unknown time  . amoxicillin-clavulanate (AUGMENTIN) 875-125 MG tablet Take 1 tablet by mouth every 12 (twelve) hours. 14 tablet 0 08/28/2018 at 2000  . cholecalciferol (VITAMIN D3) 25 MCG (1000 UT) tablet Take 1,000 Units by mouth daily.   08/28/2018 at Unknown time  . levothyroxine (SYNTHROID, LEVOTHROID) 25 MCG tablet Take 25 mcg by mouth daily.     08/28/2018 at Unknown time  . Multiple Vitamins-Minerals (MULTIVITAMIN ADULT PO) Take 1 tablet by mouth daily.   08/28/2018 at Unknown time  . pantoprazole (PROTONIX) 40 MG tablet Take 40 mg by mouth daily.    08/27/2018 at Unknown time  . potassium chloride SA (K-DUR,KLOR-CON) 20 MEQ tablet Take 1 tablet (20 mEq total) by mouth daily. 4 tablet 0 08/28/2018 at Unknown time  . Thiamine HCl  (VITAMIN B-1 PO) Take 1 tablet by mouth daily.   08/28/2018 at Unknown time  . traZODone (DESYREL) 50 MG tablet Take 1 tablet by mouth at bedtime.   Past Week at Unknown time   Scheduled: . cholecalciferol  1,000 Units Oral Daily  . hydrocortisone   Rectal QID  . levothyroxine  25 mcg Oral Q0600  . loperamide  2 mg Oral BID  . magic mouthwash  5 mL Oral  TID PC & HS  . multivitamin with minerals  1 tablet Oral Daily  . pantoprazole  40 mg Oral Daily  . potassium chloride  20 mEq Oral BID  . saccharomyces boulardii  250 mg Oral BID  . traZODone  50 mg Oral QHS   Continuous: . sodium chloride 50 mL/hr at 09/01/18 0518  . ciprofloxacin 400 mg (09/01/18 0933)  . metronidazole 500 mg (09/01/18 0522)   [id number] **OR** acetaminophen, loperamide, ondansetron **OR** ondansetron (ZOFRAN) IV, traMADol  Assesment: She was admitted with colitis.  She is still having diarrhea despite being treated.  She has hypokalemia which has been replaced  Is been dehydrated which is better  She had positive fecal DNA test as an outpatient Principal Problem:   Colitis Active Problems:   Adrenal nodule (HCC)   Hypokalemia   Dehydration with hyponatremia   Leukocytosis   Anemia   Insomnia   Abnormal CT of the abdomen   Diarrhea of infectious origin    Plan: Continue treatments.  She is for procedure tomorrow.    LOS: 1 day   Rhonda Bailey 09/01/2018, 11:16 AM

## 2018-09-02 ENCOUNTER — Inpatient Hospital Stay (HOSPITAL_COMMUNITY): Admit: 2018-09-02 | Payer: Self-pay

## 2018-09-02 ENCOUNTER — Encounter (HOSPITAL_COMMUNITY)
Admission: EM | Disposition: A | Discharge status: Home / Self Care | Payer: Self-pay | Source: Home / Self Care | Attending: Pulmonary Disease

## 2018-09-02 ENCOUNTER — Encounter (HOSPITAL_COMMUNITY): Payer: Self-pay | Admitting: *Deleted

## 2018-09-02 ENCOUNTER — Inpatient Hospital Stay (HOSPITAL_COMMUNITY): Payer: Medicare HMO | Admitting: Anesthesiology

## 2018-09-02 HISTORY — PX: BIOPSY: SHX5522

## 2018-09-02 HISTORY — PX: COLONOSCOPY WITH PROPOFOL: SHX5780

## 2018-09-02 LAB — HM COLONOSCOPY

## 2018-09-02 LAB — C DIFFICILE QUICK SCREEN W PCR REFLEX
C Diff antigen: NEGATIVE
C Diff interpretation: NOT DETECTED
C Diff toxin: NEGATIVE

## 2018-09-02 SURGERY — COLONOSCOPY WITH PROPOFOL
Anesthesia: Monitor Anesthesia Care

## 2018-09-02 MED ORDER — LACTATED RINGERS IV SOLN: INTRAVENOUS | Status: DC

## 2018-09-02 MED ORDER — PROPOFOL 10 MG/ML IV BOLUS
INTRAVENOUS | Status: DC | PRN
  Administered 2018-09-02 (×2): 10 mg via INTRAVENOUS
  Administered 2018-09-02 (×3): 20 mg via INTRAVENOUS
  Administered 2018-09-02: 10 mg via INTRAVENOUS
  Administered 2018-09-02 (×2): 20 mg via INTRAVENOUS
  Administered 2018-09-02 (×2): 10 mg via INTRAVENOUS
  Administered 2018-09-02 (×2): 20 mg via INTRAVENOUS

## 2018-09-02 MED ORDER — SODIUM CHLORIDE 0.9 % IV SOLN: INTRAVENOUS | Status: DC

## 2018-09-02 MED ORDER — HYDROMORPHONE HCL 1 MG/ML IJ SOLN: 0.2500 mg | INTRAMUSCULAR | Status: DC | PRN

## 2018-09-02 MED ORDER — PROPOFOL 500 MG/50ML IV EMUL
INTRAVENOUS | Status: DC | PRN
  Administered 2018-09-02: 125 ug/kg/min via INTRAVENOUS

## 2018-09-02 MED ORDER — BUDESONIDE 3 MG PO CPEP
9.0000 mg | ORAL_CAPSULE | Freq: Every day | ORAL | Status: DC
  Administered 2018-09-02 – 2018-09-04 (×3): 9 mg via ORAL
  Filled 2018-09-02 (×4): qty 3

## 2018-09-02 MED ORDER — MEPERIDINE HCL 100 MG/ML IJ SOLN: 6.2500 mg | INTRAMUSCULAR | Status: DC | PRN

## 2018-09-02 MED ORDER — HYDROCODONE-ACETAMINOPHEN 7.5-325 MG PO TABS: 1.0000 | ORAL_TABLET | Freq: Once | ORAL | Status: DC | PRN

## 2018-09-02 MED ORDER — LACTATED RINGERS IV SOLN
INTRAVENOUS | Status: DC | PRN
  Administered 2018-09-02: 11:00:00 via INTRAVENOUS

## 2018-09-02 MED ORDER — PROMETHAZINE HCL 25 MG/ML IJ SOLN: 6.2500 mg | INTRAMUSCULAR | Status: DC | PRN

## 2018-09-02 NOTE — Anesthesia Postprocedure Evaluation (Signed)
Anesthesia Post Note  Patient: Rhonda Bailey  Procedure(s) Performed: COLONOSCOPY WITH PROPOFOL (N/A ) BIOPSY  Patient location during evaluation: PACU Anesthesia Type: MAC Level of consciousness: awake and alert and oriented Pain management: pain level controlled Vital Signs Assessment: post-procedure vital signs reviewed and stable Respiratory status: spontaneous breathing Cardiovascular status: blood pressure returned to baseline and stable Postop Assessment: no apparent nausea or vomiting Anesthetic complications: no     Last Vitals:  Vitals:   09/02/18 1112 09/02/18 1326  BP: (!) 162/79 125/61  Pulse: 81 84  Resp:  20  Temp: 36.6 C 37 C  SpO2: 96% 99%    Last Pain:  Vitals:   09/02/18 1326  TempSrc:   PainSc: 0-No pain                 Lizzeth Meder

## 2018-09-02 NOTE — Progress Notes (Signed)
Seen in short stay.  Patient stable.  Ready for colonoscopy. The risks, benefits, limitations, alternatives and imponderables have been reviewed with the patient. Questions have been answered. All parties are agreeable.

## 2018-09-02 NOTE — Progress Notes (Signed)
Subjective: She says she feels a little bit better.  Her diarrhea may be somewhat better.  She still feels weak.  Objective: Vital signs in last 24 hours: Temp:  [97.6 F (36.4 C)-98.4 F (36.9 C)] 97.6 F (36.4 C) (01/27 0557) Pulse Rate:  [82-83] 83 (01/27 0557) Resp:  [16-17] 16 (01/27 0557) BP: (129-145)/(56-73) 134/56 (01/27 0557) SpO2:  [95 %-99 %] 95 % (01/27 0557) Weight change:  Last BM Date: 09/01/17  Intake/Output from previous day: 01/26 0701 - 01/27 0700 In: 1365.2 [P.O.:720; I.V.:331.3; IV Piggyback:313.8] Out: -   PHYSICAL EXAM General appearance: alert, cooperative and no distress Resp: clear to auscultation bilaterally Cardio: regular rate and rhythm, S1, S2 normal, no murmur, click, rub or gallop GI: No tenderness now.  Bowel sounds slightly hyperactive Extremities: extremities normal, atraumatic, no cyanosis or edema  Lab Results:  No results found for this or any previous visit (from the past 48 hour(s)).  ABGS No results for input(s): PHART, PO2ART, TCO2, HCO3 in the last 72 hours.  Invalid input(s): PCO2 CULTURES No results found for this or any previous visit (from the past 240 hour(s)). Studies/Results: No results found.  Medications:  Prior to Admission:  Medications Prior to Admission  Medication Sig Dispense Refill Last Dose  . ALPRAZolam (XANAX) 0.5 MG tablet Take 0.5 mg by mouth at bedtime.    08/27/2018 at Unknown time  . amitriptyline (ELAVIL) 25 MG tablet Take 25 mg by mouth every morning.    08/28/2018 at Unknown time  . amoxicillin-clavulanate (AUGMENTIN) 875-125 MG tablet Take 1 tablet by mouth every 12 (twelve) hours. 14 tablet 0 08/28/2018 at 2000  . cholecalciferol (VITAMIN D3) 25 MCG (1000 UT) tablet Take 1,000 Units by mouth daily.   08/28/2018 at Unknown time  . levothyroxine (SYNTHROID, LEVOTHROID) 25 MCG tablet Take 25 mcg by mouth daily.     08/28/2018 at Unknown time  . Multiple Vitamins-Minerals (MULTIVITAMIN ADULT PO) Take 1  tablet by mouth daily.   08/28/2018 at Unknown time  . pantoprazole (PROTONIX) 40 MG tablet Take 40 mg by mouth daily.    08/27/2018 at Unknown time  . potassium chloride SA (K-DUR,KLOR-CON) 20 MEQ tablet Take 1 tablet (20 mEq total) by mouth daily. 4 tablet 0 08/28/2018 at Unknown time  . Thiamine HCl (VITAMIN B-1 PO) Take 1 tablet by mouth daily.   08/28/2018 at Unknown time  . traZODone (DESYREL) 50 MG tablet Take 1 tablet by mouth at bedtime.   Past Week at Unknown time   Scheduled: . cholecalciferol  1,000 Units Oral Daily  . hydrocortisone   Rectal QID  . levothyroxine  25 mcg Oral Q0600  . loperamide  2 mg Oral BID  . magic mouthwash  5 mL Oral TID PC & HS  . multivitamin with minerals  1 tablet Oral Daily  . pantoprazole  40 mg Oral Daily  . potassium chloride  20 mEq Oral BID  . saccharomyces boulardii  250 mg Oral BID  . traZODone  50 mg Oral QHS   Continuous: . sodium chloride 50 mL/hr at 09/02/18 0550  . ciprofloxacin 400 mg (09/01/18 2322)  . metronidazole 500 mg (09/02/18 0554)   LTJ:[id number] **OR** acetaminophen, loperamide, ondansetron **OR** ondansetron (ZOFRAN) IV, traMADol  Assesment: He was admitted with colitis.  She is being treated for that and it seems to have improved a little bit.  She has had severe diarrhea and that seems to be a little bit better.  She had positive fecal DNA  test and has not had a colonoscopy.  She is mildly anemic  She has had electrolyte abnormalities related to the diarrhea  She has been dehydrated related to the diarrhea and that is better  She has external hemorrhoids are inflamed and that will need to be addressed once she is improved Principal Problem:   Colitis Active Problems:   Adrenal nodule (HCC)   Hypokalemia   Dehydration with hyponatremia   Leukocytosis   Anemia   Insomnia   Abnormal CT of the abdomen   Diarrhea of infectious origin    Plan: For flex sig/colonoscopy today.  Check labs tomorrow    LOS: 2  days   Alonza Bogus 09/02/2018, 8:37 AM

## 2018-09-02 NOTE — Op Note (Signed)
[facility] Patient Name: Rhonda Bailey Procedure Date: 09/02/2018 12:42 PM MRN: [id number] Date of Birth: 1946-01-22 Attending MD: Norvel Richards , MD CSN: [id number] Age: 73 Admit Type: Outpatient Procedure:                Ileo-colonoscopy Indications:              Clinically significant diarrhea of unexplained                            origin, Abnormal CT of the GI tract, positive Fecal                            DNA test Providers:                Norvel Richards, MD, Otis Peak B. Sharon Seller, RN,                            Nelma Rothman, Technician Referring MD:              Medicines:                Propofol per Anesthesia Complications:            No immediate complications. Estimated Blood Loss:     Estimated blood loss was minimal. Procedure:                Pre-Anesthesia Assessment:                           - Prior to the procedure, a History and Physical                            was performed, and patient medications and                            allergies were reviewed. The patient's tolerance of                            previous anesthesia was also reviewed. The risks                            and benefits of the procedure and the sedation                            options and risks were discussed with the patient.                            All questions were answered, and informed consent                            was obtained. Prior Anticoagulants: The patient has                            taken no previous anticoagulant or antiplatelet  agents. ASA Grade Assessment: III - [name] with                            severe systemic disease. After reviewing the risks                            and benefits, the patient was deemed in                            satisfactory condition to undergo the procedure.                           After obtaining informed consent, the colonoscope                            was passed under direct  vision. Throughout the                            procedure, the patient's blood pressure, pulse, and                            oxygen saturations were monitored continuously. The                            CF-HQ190L ([id number]) scope was introduced through                            the and advanced to the 5 cm into the ileum. The                            colonoscopy was performed without difficulty. The                            patient tolerated the procedure well. The quality                            of the bowel preparation was adequate. The terminal                            ileum, ileocecal valve, appendiceal orifice, and                            rectum were photographed. The entire colon was well                            visualized. Scope In: 12:52:46 PM Scope Out: 1:15:55 PM Scope Withdrawal Time: 0 hours 18 minutes 19 seconds  Total Procedure Duration: 0 hours 23 minutes 9 seconds  Findings:      The perianal exam was abnormal. Excoriated anorectal junction. Patchy       erythema of the rectal mucosa with patchy loss of the normal vascular       pattern. Marked inflammatory changes of the entire sigmoid and       descending segment with some exudate  versus partial pseudomembrane       formation there were erosions and ulcerations somewhat superficial       present. Boggy mucosa with total loss of the normal vascular pattern       some diffuse friability as well. These changes tapered off into the       transverse segment onto the cecum. The terminal ileum?"distal 5 cm       appeared normal. Biopsy of the normal-appearing ascending segment and       abnormal appearing descending, sigmoid and rectal segments taken      Many medium-mouthed diverticula were found in the entire colon. A second       stool sample was submitted for C. difficile testing. Impression:               - Abnormal perianal exam. Excoriated hemorrhoids.                            Proctocolitis  limited to the left side as                            described. Nonspecific. Some exudate versus                            pseudomembrane formation. C. difficile stool sample                            submitted once again.                           Initial stool studies negative. Patient has not                            responded to multiple rounds of antibiotics over a                            3-week.. We may be on the front end of inflammatory                            bowel disease.                           - Diverticulosis in the entire examined colon. No                            neoplasm/polyp found today. Fecal DNA testing comes                            with an at least 12% false positive rate.                           - Moderate Sedation:      Moderate (conscious) sedation was personally administered by an       anesthesia professional. The following parameters were monitored: oxygen       saturation, heart rate, blood pressure, respiratory rate, EKG, adequacy       of pulmonary ventilation, and response to care. Recommendation:           -  Return patient to hospital ward for ongoing care.                           - Advance diet as tolerated.                           - Continue present medications. Begin Entocort 9 mg                            daily. Double checking negative C. difficile status.                           Follow-up on pathology.                           - Repeat colonoscopy date to be determined after                            pending pathology results are reviewed for                            surveillance based on pathology results.                           - Return to GI office (date not yet determined). Procedure Code(s):        --- Professional ---                           [phone], Colonoscopy, flexible; diagnostic, including                            collection of specimen(s) by brushing or washing,                            when  performed (separate procedure) Diagnosis Code(s):        --- Professional ---                           R19.7, Diarrhea, unspecified                           K57.30, Diverticulosis of large intestine without                            perforation or abscess without bleeding                           R93.3, Abnormal findings on diagnostic imaging of                            other parts of digestive tract CPT copyright 2018 [employer]. All rights reserved. The codes documented in this report are preliminary and upon coder review may  be revised to meet current compliance requirements. Cristopher Estimable. Jaion Lagrange, MD Norvel Richards, MD 09/02/2018 1:36:54 PM This report has been signed electronically. Number of Addenda: 0

## 2018-09-02 NOTE — Anesthesia Preprocedure Evaluation (Signed)
Anesthesia Evaluation    Airway Mallampati: II   Neck ROM: full    Dental  (+) Dental Advidsory Given   Pulmonary    breath sounds clear to auscultation       Cardiovascular  Rhythm:regular     Neuro/Psych    GI/Hepatic   Endo/Other  Hypothyroidism   Renal/GU      Musculoskeletal   Abdominal   Peds  Hematology  (+) Blood dyscrasia, anemia ,   Anesthesia Other Findings Weeks of diarrhea, dehydration with multiple ER visits for dehydration.  Hospitalized past four days. Denies any sig CV, Pulm, DM, Neuro   Reproductive/Obstetrics                             Anesthesia Physical Anesthesia Plan  ASA: III  Anesthesia Plan: MAC   Post-op Pain Management:    Induction:   PONV Risk Score and Plan:   Airway Management Planned:   Additional Equipment:   Intra-op Plan:   Post-operative Plan:   Informed Consent:   Plan Discussed with: Anesthesiologist  Anesthesia Plan Comments:         Anesthesia Quick Evaluation

## 2018-09-02 NOTE — Transfer of Care (Signed)
Immediate Anesthesia Transfer of Care Note  Patient: Rhonda Bailey  Procedure(s) Performed: COLONOSCOPY WITH PROPOFOL (N/A ) BIOPSY  Patient Location: PACU  Anesthesia Type:MAC  Level of Consciousness: awake  Airway & Oxygen Therapy: Patient Spontanous Breathing  Post-op Assessment: Report given to RN  Post vital signs: Reviewed  Last Vitals:  Vitals Value Taken Time  BP    Temp    Pulse    Resp    SpO2      Last Pain:  Vitals:   09/02/18 1246  TempSrc:   PainSc: 0-No pain         Complications: No apparent anesthesia complications

## 2018-09-02 NOTE — Care Management Important Message (Signed)
Important Message  Patient Details  Name: Rhonda Bailey MRN: [id number] Date of Birth: Jan 15, 1946   Medicare Important Message Given:  Yes    Shelda Altes 09/02/2018, 1:34 PM

## 2018-09-02 NOTE — Progress Notes (Signed)
NPO. Tolerated prep well. Now clear. Aware of risks and benefits. No questions. Family member at bedside. Colonoscopy with Propofol for today by Dr. Gala Romney.  Annitta Needs, PhD, ANP-BC [facility]

## 2018-09-03 LAB — CBC WITH DIFFERENTIAL/PLATELET
Abs Immature Granulocytes: 0.2 10*3/uL — ABNORMAL HIGH (ref 0.00–0.07)
Basophils Absolute: 0.1 10*3/uL (ref 0.0–0.1)
Basophils Relative: 1 %
Eosinophils Absolute: 0.4 10*3/uL (ref 0.0–0.5)
Eosinophils Relative: 3 %
HCT: 32.9 % — ABNORMAL LOW (ref 36.0–46.0)
Hemoglobin: 9.4 g/dL — ABNORMAL LOW (ref 12.0–15.0)
Immature Granulocytes: 2 %
Lymphocytes Relative: 23 %
Lymphs Abs: 2.4 10*3/uL (ref 0.7–4.0)
MCH: 20.7 pg — ABNORMAL LOW (ref 26.0–34.0)
MCHC: 28.6 g/dL — ABNORMAL LOW (ref 30.0–36.0)
MCV: 72.5 fL — ABNORMAL LOW (ref 80.0–100.0)
Monocytes Absolute: 0.9 10*3/uL (ref 0.1–1.0)
Monocytes Relative: 8 %
Neutro Abs: 6.7 10*3/uL (ref 1.7–7.7)
Neutrophils Relative %: 63 %
Platelets: 572 10*3/uL — ABNORMAL HIGH (ref 150–400)
RBC: 4.54 MIL/uL (ref 3.87–5.11)
RDW: 19.9 % — ABNORMAL HIGH (ref 11.5–15.5)
WBC: 10.6 10*3/uL — ABNORMAL HIGH (ref 4.0–10.5)
nRBC: 0 % (ref 0.0–0.2)

## 2018-09-03 LAB — BASIC METABOLIC PANEL
Anion gap: 6 (ref 5–15)
BUN: 5 mg/dL — ABNORMAL LOW (ref 8–23)
CO2: 25 mmol/L (ref 22–32)
Calcium: 8.1 mg/dL — ABNORMAL LOW (ref 8.9–10.3)
Chloride: 110 mmol/L (ref 98–111)
Creatinine, Ser: 0.69 mg/dL (ref 0.44–1.00)
GFR calc Af Amer: >60 mL/min (ref >60)
GFR calc non Af Amer: >60 mL/min (ref >60)
Glucose, Bld: 101 mg/dL — ABNORMAL HIGH (ref 70–99)
Potassium: 4.1 mmol/L (ref 3.5–5.1)
Sodium: 141 mmol/L (ref 135–145)

## 2018-09-03 MED ORDER — PANTOPRAZOLE SODIUM 40 MG PO TBEC
40.0000 mg | DELAYED_RELEASE_TABLET | Freq: Every day | ORAL | Status: DC
  Administered 2018-09-03 – 2018-09-04 (×2): 40 mg via ORAL
  Filled 2018-09-03: qty 1

## 2018-09-03 MED ORDER — METRONIDAZOLE 500 MG PO TABS
500.0000 mg | ORAL_TABLET | Freq: Three times a day (TID) | ORAL | Status: DC
  Administered 2018-09-03: 500 mg via ORAL
  Filled 2018-09-03: qty 1

## 2018-09-03 MED ORDER — CIPROFLOXACIN HCL 250 MG PO TABS
500.0000 mg | ORAL_TABLET | Freq: Two times a day (BID) | ORAL | Status: DC
  Administered 2018-09-03: 500 mg via ORAL
  Filled 2018-09-03: qty 2

## 2018-09-03 NOTE — Progress Notes (Addendum)
Subjective: She had colonoscopy yesterday with no malignancy but extensive colitis.  She says she feels much better.  She had biopsies done which are pending.  Objective: Vital signs in last 24 hours: Temp:  [97.7 F (36.5 C)-98.6 F (37 C)] 97.9 F (36.6 C) (01/28 0543) Pulse Rate:  [76-84] 81 (01/28 0543) Resp:  [14-20] 20 (01/28 0543) BP: (119-162)/(53-79) 119/53 (01/28 0543) SpO2:  [94 %-99 %] 95 % (01/28 0543) Weight:  [85 kg] 85 kg (01/27 1112) Weight change:  Last BM Date: 09/02/18  Intake/Output from previous day: 01/27 0701 - 01/28 0700 In: 3636.9 [P.O.:480; I.V.:2287.9; IV Piggyback:868.9] Out: 0   PHYSICAL EXAM General appearance: alert, cooperative and no distress Resp: clear to auscultation bilaterally Cardio: regular rate and rhythm, S1, S2 normal, no murmur, click, rub or gallop GI: soft, non-tender; bowel sounds normal; no masses,  no organomegaly Extremities: extremities normal, atraumatic, no cyanosis or edema  Lab Results:  Results for orders placed or performed during the hospital encounter of 08/28/18 (from the past 48 hour(s))  C Difficile Quick Screen w PCR reflex     Status: None   Collection Time: 09/02/18  1:07 PM  Result Value Ref Range   C Diff antigen NEGATIVE NEGATIVE   C Diff toxin NEGATIVE NEGATIVE   C Diff interpretation No C. difficile detected.     Comment: Performed at [facility], [address] [place], [place] [address]  CBC with Differential/Platelet     Status: Abnormal   Collection Time: 09/03/18  4:30 AM  Result Value Ref Range   WBC 10.6 (H) 4.0 - 10.5 K/uL   RBC 4.54 3.87 - 5.11 MIL/uL   Hemoglobin 9.4 (L) 12.0 - 15.0 g/dL   HCT 32.9 (L) 36.0 - 46.0 %   MCV 72.5 (L) 80.0 - 100.0 fL   MCH 20.7 (L) 26.0 - 34.0 pg   MCHC 28.6 (L) 30.0 - 36.0 g/dL   RDW 19.9 (H) 11.5 - 15.5 %   Platelets 572 (H) 150 - 400 K/uL   nRBC 0.0 0.0 - 0.2 %   Neutrophils Relative % 63 %   Neutro Abs 6.7 1.7 - 7.7 K/uL   Lymphocytes Relative 23 %    Lymphs Abs 2.4 0.7 - 4.0 K/uL   Monocytes Relative 8 %   Monocytes Absolute 0.9 0.1 - 1.0 K/uL   Eosinophils Relative 3 %   Eosinophils Absolute 0.4 0.0 - 0.5 K/uL   Basophils Relative 1 %   Basophils Absolute 0.1 0.0 - 0.1 K/uL   Immature Granulocytes 2 %   Abs Immature Granulocytes 0.20 (H) 0.00 - 0.07 K/uL    Comment: Performed at [facility], [address] [place], [place] [address]  Basic metabolic panel     Status: Abnormal   Collection Time: 09/03/18  4:30 AM  Result Value Ref Range   Sodium 141 135 - 145 mmol/L   Potassium 4.1 3.5 - 5.1 mmol/L   Chloride 110 98 - 111 mmol/L   CO2 25 22 - 32 mmol/L   Glucose, Bld 101 (H) 70 - 99 mg/dL   BUN 5 (L) 8 - 23 mg/dL   Creatinine, Ser 0.69 0.44 - 1.00 mg/dL   Calcium 8.1 (L) 8.9 - 10.3 mg/dL   GFR calc non Af Amer >60 >60 mL/min   GFR calc Af Amer >60 >60 mL/min   Anion gap 6 5 - 15    Comment: Performed at [facility], [address], [place], [place] [address]  ABGS No results for input(s): PHART, PO2ART, TCO2, HCO3 in the last 72 hours.  Invalid input(s): PCO2 CULTURES Recent Results (from the past 240 hour(s))  C Difficile Quick Screen w PCR reflex     Status: None   Collection Time: 09/02/18  1:07 PM  Result Value Ref Range Status   C Diff antigen NEGATIVE NEGATIVE Final   C Diff toxin NEGATIVE NEGATIVE Final   C Diff interpretation No C. difficile detected.  Final    Comment: Performed at [facility], [address] [place], [place] [address]   Studies/Results: No results found.  Medications:  Prior to Admission:  Medications Prior to Admission  Medication Sig Dispense Refill Last Dose  . ALPRAZolam (XANAX) 0.5 MG tablet Take 0.5 mg by mouth at bedtime.    08/27/2018 at Unknown time  . amitriptyline (ELAVIL) 25 MG tablet Take 25 mg by mouth every morning.    08/28/2018 at Unknown time  . amoxicillin-clavulanate (AUGMENTIN) 875-125 MG tablet Take 1 tablet by mouth every 12 (twelve) hours. 14 tablet 0  08/28/2018 at 2000  . cholecalciferol (VITAMIN D3) 25 MCG (1000 UT) tablet Take 1,000 Units by mouth daily.   08/28/2018 at Unknown time  . Multiple Vitamins-Minerals (MULTIVITAMIN ADULT PO) Take 1 tablet by mouth daily.   08/28/2018 at Unknown time  . pantoprazole (PROTONIX) 40 MG tablet Take 40 mg by mouth daily.    08/27/2018 at Unknown time  . potassium chloride SA (K-DUR,KLOR-CON) 20 MEQ tablet Take 1 tablet (20 mEq total) by mouth daily. 4 tablet 0 08/28/2018 at Unknown time  . Thiamine HCl (VITAMIN B-1 PO) Take 1 tablet by mouth daily.   08/28/2018 at Unknown time  . traZODone (DESYREL) 50 MG tablet Take 1 tablet by mouth at bedtime.   Past Week at Unknown time  . levothyroxine (SYNTHROID, LEVOTHROID) 25 MCG tablet Take 25 mcg by mouth daily.        Scheduled: . budesonide  9 mg Oral Daily  . cholecalciferol  1,000 Units Oral Daily  . hydrocortisone   Rectal QID  . levothyroxine  25 mcg Oral Q0600  . loperamide  2 mg Oral BID  . magic mouthwash  5 mL Oral TID PC & HS  . multivitamin with minerals  1 tablet Oral Daily  . pantoprazole  40 mg Oral Daily  . potassium chloride  20 mEq Oral BID  . saccharomyces boulardii  250 mg Oral BID  . traZODone  50 mg Oral QHS   Continuous: . sodium chloride 50 mL/hr at 09/03/18 0006  . ciprofloxacin Stopped (09/02/18 2230)  . metronidazole 500 mg (09/03/18 8466)   ZLD:[id number] **OR** acetaminophen, loperamide, ondansetron **OR** ondansetron (ZOFRAN) IV, traMADol  Assesment: She was admitted with colitis and had severe diarrhea and was dehydrated.  She is had trouble with electrolyte imbalances.  Overall she is much better and has potential for discharge.  Will transition to p.o. antibiotics will discuss with GI about whether she is okay to go or whether she needs another 24 hours to see how she does on p.o. and await the biopsies Principal Problem:   Colitis Active Problems:   Adrenal nodule (HCC)   Hypokalemia   Dehydration with  hyponatremia   Leukocytosis   Anemia   Insomnia   Abnormal CT of the abdomen   Diarrhea of infectious origin    Plan: As above    LOS: 3 days   Alonza Bogus 09/03/2018, 9:12 AM

## 2018-09-03 NOTE — Care Management Note (Signed)
Case Management Note  Patient Details  Name: Rhonda Bailey MRN: [id number] Date of Birth: 1945/10/29  If discussed at [place] Length of Stay Meetings, dates discussed:  09/03/18  Additional Comments:  Galadriel Shroff, Chauncey Reading, RN 09/03/2018, 12:11 PM

## 2018-09-03 NOTE — Plan of Care (Signed)

## 2018-09-03 NOTE — Progress Notes (Signed)
    Subjective: Feels much improved. Two small stools with improved consistency since colonoscopy. Hemorrhoids less aggravated. Tolerating oral intake but wonders if she should be on a soft diet instead of heart healthy. Would like to change diet .  Objective: Vital signs in last 24 hours: Temp:  [97.7 F (36.5 C)-98.6 F (37 C)] 97.9 F (36.6 C) (01/28 0543) Pulse Rate:  [76-84] 81 (01/28 0543) Resp:  [14-20] 20 (01/28 0543) BP: (119-162)/(53-79) 119/53 (01/28 0543) SpO2:  [94 %-99 %] 95 % (01/28 0543) Weight:  [85 kg] 85 kg (01/27 1112) Last BM Date: 09/02/18 General:   Alert and oriented, pleasant Head:  Normocephalic and atraumatic. Abdomen:  Bowel sounds present, soft, non-tender, non-distended.  Extremities:  Without edema. Psych:   Normal mood and affect.  Intake/Output from previous day: 01/27 0701 - 01/28 0700 In: 3636.9 [P.O.:480; I.V.:2287.9; IV Piggyback:868.9] Out: 0  Intake/Output this shift: No intake/output data recorded.  Lab Results: Recent Labs    09/03/18 0430  WBC 10.6*  HGB 9.4*  HCT 32.9*  PLT 572*   BMET Recent Labs    09/03/18 0430  NA 141  K 4.1  CL 110  CO2 25  GLUCOSE 101*  BUN 5*  CREATININE 0.69  CALCIUM 8.1*    Assessment: 73 year old female admitted with profuse watery diarrhea and abnormal appearing left colon on CT, showing no improvement with antibiotic course. Colonoscopy 09/02/2018 with proctocolitis limited to left side with exudate versus partial pseudomembrane formation. Erosions and ulcerations superficial present. Boggy mucosa with total loss of normal vascular pattern and diffuse friability. TI appeared normal. Many medium-mouthed diverticula in entire colon. Sample sent for Cdiff again and negative. Pathology pending. Concern for evolving IBD. May have element of ischemia as well. Entocort started yesterday.   Microcytic anemia: Hgb stable. Iron low at 5, ferritin 67. Stable. Unknown baseline.   Diarrhea: GI pathogen  panel negative, Cdiff negative on admission and also via stool samples at time of colonoscopy. Much improved.   Anticipate she will likely go home in next 24 hours.     Plan: Continue entocort 9 mg daily, first dose 1/27 Changed diet to low residue PPI daily Await pathology Hopeful discharge in next 24 hours or so  Annitta Needs, PhD, ANP-BC [facility]     LOS: 3 days    09/03/2018, 9:11 AM

## 2018-09-04 ENCOUNTER — Ambulatory Visit: Payer: Medicare HMO | Admitting: Gastroenterology

## 2018-09-04 ENCOUNTER — Encounter (HOSPITAL_COMMUNITY): Payer: Self-pay | Admitting: Internal Medicine

## 2018-09-04 MED ORDER — BUDESONIDE 3 MG PO CPEP: 9.0000 mg | ORAL_CAPSULE | Freq: Every day | ORAL | 0 refills | Status: DC

## 2018-09-04 MED ORDER — SACCHAROMYCES BOULARDII 250 MG PO CAPS: 250.0000 mg | ORAL_CAPSULE | Freq: Two times a day (BID) | ORAL | 2 refills | Status: DC

## 2018-09-04 MED ORDER — ONDANSETRON HCL 4 MG PO TABS: 4.0000 mg | ORAL_TABLET | Freq: Four times a day (QID) | ORAL | 0 refills | Status: DC | PRN

## 2018-09-04 MED ORDER — LOPERAMIDE HCL 2 MG PO CAPS: 2.0000 mg | ORAL_CAPSULE | ORAL | 0 refills | Status: DC | PRN

## 2018-09-04 MED ORDER — HYDROCORTISONE 1 % RE CREA: TOPICAL_CREAM | RECTAL | 2 refills | Status: DC

## 2018-09-04 NOTE — Progress Notes (Signed)
Patient discharged home today per MD orders. Patient vital signs WDL. IV removed and site WDL. Discharge Instructions including follow up appointments, medications, and education reviewed with patient. Patient verbalizes understanding. Patient is transported out via wheelchair.  

## 2018-09-04 NOTE — Discharge Summary (Signed)
Physician Discharge Summary  Patient ID: Rhonda Bailey MRN: [id number] DOB/AGE: 1945/09/09 73 y.o. Primary Care Physician:Geary Rufo, Percell Miller, MD Admit date: 08/28/2018 Discharge date: 09/04/2018    Discharge Diagnoses:   Principal Problem:   Colitis Active Problems:   Adrenal nodule (HCC)   Hypokalemia   Dehydration with hyponatremia   Leukocytosis   Anemia   Insomnia   Abnormal CT of the abdomen   Diarrhea of infectious origin External hemorrhoids  Allergies as of 09/04/2018   No Known Allergies     Medication List    STOP taking these medications   amoxicillin-clavulanate 875-125 MG tablet Commonly known as:  AUGMENTIN     TAKE these medications   ALPRAZolam 0.5 MG tablet Commonly known as:  XANAX Take 0.5 mg by mouth at bedtime.   amitriptyline 25 MG tablet Commonly known as:  ELAVIL Take 25 mg by mouth every morning.   budesonide 3 MG 24 hr capsule Commonly known as:  ENTOCORT EC Take 3 capsules (9 mg total) by mouth daily.   cholecalciferol 25 MCG (1000 UT) tablet Commonly known as:  VITAMIN D3 Take 1,000 Units by mouth daily.   hydrocortisone 1 % Crea Commonly known as:  PROCTO-PAK Apply to rectum 4 times daily   levothyroxine 25 MCG tablet Commonly known as:  SYNTHROID, LEVOTHROID Take 25 mcg by mouth daily.   loperamide 2 MG capsule Commonly known as:  IMODIUM Take 1 capsule (2 mg total) by mouth as needed for diarrhea or loose stools.   MULTIVITAMIN ADULT PO Take 1 tablet by mouth daily.   ondansetron 4 MG tablet Commonly known as:  ZOFRAN Take 1 tablet (4 mg total) by mouth every 6 (six) hours as needed for nausea.   pantoprazole 40 MG tablet Commonly known as:  PROTONIX Take 40 mg by mouth daily.   potassium chloride SA 20 MEQ tablet Commonly known as:  K-DUR,KLOR-CON Take 1 tablet (20 mEq total) by mouth daily.   saccharomyces boulardii 250 MG capsule Commonly known as:  FLORASTOR Take 1 capsule (250 mg total) by mouth 2 (two)  times daily.   traZODone 50 MG tablet Commonly known as:  DESYREL Take 1 tablet by mouth at bedtime.   VITAMIN B-1 PO Take 1 tablet by mouth daily.       Discharged Condition: Improved    Consults: Gastroenterology  Significant Diagnostic Studies: Ct Abdomen Pelvis W Contrast  Result Date: 08/29/2018 CLINICAL DATA:  73 year old female with abdominal pain. History of recent colitis. EXAM: CT ABDOMEN AND PELVIS WITH CONTRAST TECHNIQUE: Multidetector CT imaging of the abdomen and pelvis was performed using the standard protocol following bolus administration of intravenous contrast. CONTRAST:  1m ISOVUE-300 IOPAMIDOL (ISOVUE-300) INJECTION 61%, 1023mISOVUE-300 IOPAMIDOL (ISOVUE-300) INJECTION 61% COMPARISON:  CT of the abdomen pelvis dated 08/22/2018 FINDINGS: Lower chest: Minimal bibasilar dependent atelectatic changes. The visualized lung bases are otherwise clear. No intra-abdominal free air. Small free fluid within the pelvis. Hepatobiliary: Subcentimeter hypodense focus in the right lobe of the liver is too small to characterize. The liver is otherwise unremarkable. Mild intrahepatic biliary ductal dilatation, likely post cholecystectomy. Pancreas: Unremarkable. No pancreatic ductal dilatation or surrounding inflammatory changes. Spleen: Normal in size without focal abnormality. Adrenals/Urinary Tract: Indeterminate 2 cm left adrenal nodule. The right adrenal gland is unremarkable. Mild fullness of the renal collecting systems bilaterally. There is symmetric enhancement and excretion of contrast by both kidneys. There is a right extrarenal pelvis. The visualized ureters and urinary bladder appear unremarkable. Stomach/Bowel: There is  extensive diverticulosis of the descending and sigmoid colon. There is diffuse thickening and inflammatory changes of the descending and sigmoid colon most compatible with colitis. The degree of inflammation is similar or slightly increased since the prior CT.  There is postsurgical changes of Nissen fundoplication. No bowel obstruction. The appendix is normal. Vascular/Lymphatic: Mild aortoiliac atherosclerotic disease. No portal venous gas. There is no adenopathy. Reproductive: The uterus is retroflexed. There is thickened appearance of the endometrium measuring approximately 9 mm. Further evaluation with pelvic ultrasound on a nonemergent basis recommended. No pelvic mass. Other: None Musculoskeletal: Osteopenia with degenerative changes of the spine. No acute osseous pathology. Bilateral hip arthritic changes. IMPRESSION: 1. Findings most consistent with colitis involving the descending and sigmoid colon similar or slightly worsened since the prior CT. 2. Extensive diverticulosis of the descending and sigmoid colon. No bowel obstruction. Normal appendix. 3. Thickened appearance of the endometrium. Further evaluation with pelvic ultrasound on a nonemergent basis recommended. Electronically Signed   By: Anner Crete M.D.   On: 08/29/2018 02:05   Ct Abdomen Pelvis W Contrast  Result Date: 08/22/2018 CLINICAL DATA:  Patient with lower abdominal pain and diarrhea. EXAM: CT ABDOMEN AND PELVIS WITH CONTRAST TECHNIQUE: Multidetector CT imaging of the abdomen and pelvis was performed using the standard protocol following bolus administration of intravenous contrast. CONTRAST:  118m ISOVUE-300 IOPAMIDOL (ISOVUE-300) INJECTION 61% COMPARISON:  None. FINDINGS: Lower chest: Normal heart size. Dependent atelectasis bilateral lower lobes. No pleural effusion. Hepatobiliary: Liver is normal in size and contour. Gallbladder surgically absent. No intrahepatic or extrahepatic biliary ductal dilatation. Pancreas: Mildly atrophic Spleen: Unremarkable Adrenals/Urinary Tract: 2.6 cm indeterminate left adrenal nodule (image 23; series 2). Normal right adrenal gland. Kidneys enhance symmetrically with contrast. No hydronephrosis. Urinary bladder is unremarkable. Stomach/Bowel: There  is circumferential wall thickening and pericolonic fat stranding involving the descending colon and rectum. No evidence for upstream bowel obstruction. Normal appendix. No free intraperitoneal air. Normal morphology of the stomach. Small hiatal hernia. Vascular/Lymphatic: Normal caliber abdominal aorta. Peripheral calcified atherosclerotic plaque. No retroperitoneal lymphadenopathy. Reproductive: Uterus and adnexal structures are unremarkable. Other: None. Musculoskeletal: Lumbar spine degenerative changes. No aggressive or acute appearing osseous lesions. IMPRESSION: 1. Colonic wall thickening and pericolonic fat stranding about the descending colon and rectum, most compatible with colitis. 2. Indeterminate left adrenal nodule. Recommend follow-up adrenal protocol CT in 12 months. Electronically Signed   By: DLovey NewcomerM.D.   On: 08/22/2018 18:05    Lab Results: Basic Metabolic Panel: Recent Labs    09/03/18 0430  NA 141  K 4.1  CL 110  CO2 25  GLUCOSE 101*  BUN 5*  CREATININE 0.69  CALCIUM 8.1*   Liver Function Tests: No results for input(s): AST, ALT, ALKPHOS, BILITOT, PROT, ALBUMIN in the last 72 hours.   CBC: Recent Labs    09/03/18 0430  WBC 10.6*  NEUTROABS 6.7  HGB 9.4*  HCT 32.9*  MCV 72.5*  PLT 572*    Recent Results (from the past 240 hour(s))  C Difficile Quick Screen w PCR reflex     Status: None   Collection Time: 09/02/18  1:07 PM  Result Value Ref Range Status   C Diff antigen NEGATIVE NEGATIVE Final   C Diff toxin NEGATIVE NEGATIVE Final   C Diff interpretation No C. difficile detected.  Final    Comment: [facility] [address], [place] [id number]    Hospital Course: This is a 73year old who came to my office  after about a 3-week history of abdominal pain nausea vomiting and diarrhea.  She had been to the emergency room twice.  I attempted to directly admit her but there were no beds and I was told that it would be a substantial  weight so I sent her to the emergency department.  She was found to be dehydrated.  She had low potassium.  She had low sodium.  CT showed colitis.  She was started on Cipro and Flagyl.  She slowly improved.  She had GI consultation and eventually underwent colonoscopy which showed colitis with possible pseudomembranes but she was negative for C. difficile.  Enteric pathogen panel was not helpful.  She improved with treatment.  By the time of discharge she was able to eat solid food had no further diarrhea.  Discharge Exam: Blood pressure 135/76, pulse 80, temperature 98.2 F (36.8 C), temperature source Oral, resp. rate 20, height 5' 8"  (1.727 m), weight 85 kg, SpO2 96 %. She is awake and alert.  Chest is clear.  Heart is regular.  Abdomen is soft with no masses.  Disposition: Home      Signed: Alonza Bogus   09/04/2018, 9:38 AM

## 2018-09-04 NOTE — Progress Notes (Signed)
Subjective: She is much better.  She wants to go home.  She had 2 normal bowel movements this morning.  She is not having any cramping.  She is not having any pain.  Objective: Vital signs in last 24 hours: Temp:  [97.9 F (36.6 C)-98.2 F (36.8 C)] 98.2 F (36.8 C) (01/29 0546) Pulse Rate:  [77-86] 80 (01/29 0546) Resp:  [20] 20 (01/29 0546) BP: (135-149)/(76-79) 135/76 (01/29 0546) SpO2:  [96 %-98 %] 96 % (01/29 0546) Weight change:  Last BM Date: 09/03/18  Intake/Output from previous day: 01/28 0701 - 01/29 0700 In: 1720 [P.O.:1720] Out: 1500 [Urine:1500]  PHYSICAL EXAM General appearance: alert, cooperative and no distress Resp: clear to auscultation bilaterally Cardio: regular rate and rhythm, S1, S2 normal, no murmur, click, rub or gallop GI: soft, non-tender; bowel sounds normal; no masses,  no organomegaly Extremities: extremities normal, atraumatic, no cyanosis or edema  Lab Results:  Results for orders placed or performed during the hospital encounter of 08/28/18 (from the past 48 hour(s))  C Difficile Quick Screen w PCR reflex     Status: None   Collection Time: 09/02/18  1:07 PM  Result Value Ref Range   C Diff antigen NEGATIVE NEGATIVE   C Diff toxin NEGATIVE NEGATIVE   C Diff interpretation No C. difficile detected.     Comment: Performed at [facility], [address], [place], [place] [address]  CBC with Differential/Platelet     Status: Abnormal   Collection Time: 09/03/18  4:30 AM  Result Value Ref Range   WBC 10.6 (H) 4.0 - 10.5 K/uL   RBC 4.54 3.87 - 5.11 MIL/uL   Hemoglobin 9.4 (L) 12.0 - 15.0 g/dL   HCT 32.9 (L) 36.0 - 46.0 %   MCV 72.5 (L) 80.0 - 100.0 fL   MCH 20.7 (L) 26.0 - 34.0 pg   MCHC 28.6 (L) 30.0 - 36.0 g/dL   RDW 19.9 (H) 11.5 - 15.5 %   Platelets 572 (H) 150 - 400 K/uL   nRBC 0.0 0.0 - 0.2 %   Neutrophils Relative % 63 %   Neutro Abs 6.7 1.7 - 7.7 K/uL   Lymphocytes Relative 23 %   Lymphs Abs 2.4 0.7 - 4.0 K/uL   Monocytes  Relative 8 %   Monocytes Absolute 0.9 0.1 - 1.0 K/uL   Eosinophils Relative 3 %   Eosinophils Absolute 0.4 0.0 - 0.5 K/uL   Basophils Relative 1 %   Basophils Absolute 0.1 0.0 - 0.1 K/uL   Immature Granulocytes 2 %   Abs Immature Granulocytes 0.20 (H) 0.00 - 0.07 K/uL    Comment: Performed at [facility], [address], [place], [place] [address]  Basic metabolic panel     Status: Abnormal   Collection Time: 09/03/18  4:30 AM  Result Value Ref Range   Sodium 141 135 - 145 mmol/L   Potassium 4.1 3.5 - 5.1 mmol/L   Chloride 110 98 - 111 mmol/L   CO2 25 22 - 32 mmol/L   Glucose, Bld 101 (H) 70 - 99 mg/dL   BUN 5 (L) 8 - 23 mg/dL   Creatinine, Ser 0.69 0.44 - 1.00 mg/dL   Calcium 8.1 (L) 8.9 - 10.3 mg/dL   GFR calc non Af Amer >60 >60 mL/min   GFR calc Af Amer >60 >60 mL/min   Anion gap 6 5 - 15    Comment: Performed at [facility], [address], [place], [place] [address]    ABGS  No results for input(s): PHART, PO2ART, TCO2, HCO3 in the last 72 hours.  Invalid input(s): PCO2 CULTURES Recent Results (from the past 240 hour(s))  C Difficile Quick Screen w PCR reflex     Status: None   Collection Time: 09/02/18  1:07 PM  Result Value Ref Range Status   C Diff antigen NEGATIVE NEGATIVE Final   C Diff toxin NEGATIVE NEGATIVE Final   C Diff interpretation No C. difficile detected.  Final    Comment: Performed at [facility], [address] [place], [place] [address]   Studies/Results: No results found.  Medications:  Prior to Admission:  Medications Prior to Admission  Medication Sig Dispense Refill Last Dose  . ALPRAZolam (XANAX) 0.5 MG tablet Take 0.5 mg by mouth at bedtime.    08/27/2018 at Unknown time  . amitriptyline (ELAVIL) 25 MG tablet Take 25 mg by mouth every morning.    08/28/2018 at Unknown time  . amoxicillin-clavulanate (AUGMENTIN) 875-125 MG tablet Take 1 tablet by mouth every 12 (twelve) hours. 14 tablet 0 08/28/2018 at 2000  . cholecalciferol (VITAMIN D3)  25 MCG (1000 UT) tablet Take 1,000 Units by mouth daily.   08/28/2018 at Unknown time  . Multiple Vitamins-Minerals (MULTIVITAMIN ADULT PO) Take 1 tablet by mouth daily.   08/28/2018 at Unknown time  . pantoprazole (PROTONIX) 40 MG tablet Take 40 mg by mouth daily.    08/27/2018 at Unknown time  . potassium chloride SA (K-DUR,KLOR-CON) 20 MEQ tablet Take 1 tablet (20 mEq total) by mouth daily. 4 tablet 0 08/28/2018 at Unknown time  . Thiamine HCl (VITAMIN B-1 PO) Take 1 tablet by mouth daily.   08/28/2018 at Unknown time  . traZODone (DESYREL) 50 MG tablet Take 1 tablet by mouth at bedtime.   Past Week at Unknown time  . levothyroxine (SYNTHROID, LEVOTHROID) 25 MCG tablet Take 25 mcg by mouth daily.        Scheduled: . budesonide  9 mg Oral Daily  . cholecalciferol  1,000 Units Oral Daily  . hydrocortisone   Rectal QID  . levothyroxine  25 mcg Oral Q0600  . magic mouthwash  5 mL Oral TID PC & HS  . multivitamin with minerals  1 tablet Oral Daily  . pantoprazole  40 mg Oral Daily  . potassium chloride  20 mEq Oral BID  . saccharomyces boulardii  250 mg Oral BID  . traZODone  50 mg Oral QHS   Continuous:  WER:[id number] **OR** acetaminophen, loperamide, ondansetron **OR** ondansetron (ZOFRAN) IV, traMADol  Assesment: She was admitted with colitis.  She is much improved.  She had dehydration and that is much better.  She had electrolyte abnormalities associated with the colitis and the dehydration and that has improved Principal Problem:   Colitis Active Problems:   Adrenal nodule (HCC)   Hypokalemia   Dehydration with hyponatremia   Leukocytosis   Anemia   Insomnia   Abnormal CT of the abdomen   Diarrhea of infectious origin    Plan: Discharge home today    LOS: 4 days   Alonza Bogus 09/04/2018, 9:33 AM

## 2018-09-09 ENCOUNTER — Telehealth: Payer: Self-pay

## 2018-09-09 ENCOUNTER — Encounter: Payer: Self-pay | Admitting: Internal Medicine

## 2018-09-09 NOTE — Telephone Encounter (Signed)
Per RMR- Send letter to patient.  Send copy of letter with path to referring provider and PCP.   Needs extender appointment within the next 4 to 8 weeks

## 2018-09-10 ENCOUNTER — Other Ambulatory Visit: Payer: Self-pay

## 2018-09-10 NOTE — Patient Outreach (Signed)
[facility]) Care Management  09/10/2018  Rhonda Bailey 09/24/1945 [id number]   EMMI: general discharge red alert Referral date: 09/10/18 Referral reason: scheduled follow up Insurance:  [facility] Day # 4  Telephone call to patient regarding EMMI general discharge red alert. Patient would not verify HIPAA.  Patient states she has had 2 automated phone calls this week. She states she is doing fine and has a follow up appointment. She states," everyone has treated me fine and gone over the appropriate information with me." Patient states she does not want to go through another phone call at this time.   PLAN: RNCm will close patient due to patient refusing call RNCm will send patient [facility] care management brochure/ magnet/ know before you go sheet.   Quinn Plowman RN,BSN,CCM [facility] Telephonic  [phone]

## 2018-09-11 ENCOUNTER — Encounter: Payer: Self-pay | Admitting: Internal Medicine

## 2018-09-11 NOTE — Telephone Encounter (Signed)
OV made °

## 2018-09-18 DIAGNOSIS — E876 Hypokalemia: Secondary | ICD-10-CM | POA: Diagnosis not present

## 2018-09-18 DIAGNOSIS — E039 Hypothyroidism, unspecified: Secondary | ICD-10-CM | POA: Diagnosis not present

## 2018-09-18 DIAGNOSIS — K529 Noninfective gastroenteritis and colitis, unspecified: Secondary | ICD-10-CM | POA: Diagnosis not present

## 2018-09-18 DIAGNOSIS — I10 Essential (primary) hypertension: Secondary | ICD-10-CM | POA: Diagnosis not present

## 2018-11-14 ENCOUNTER — Ambulatory Visit: Payer: Medicare HMO | Admitting: Gastroenterology

## 2018-11-25 ENCOUNTER — Encounter: Payer: Self-pay | Admitting: Family Medicine

## 2018-11-25 DIAGNOSIS — E039 Hypothyroidism, unspecified: Secondary | ICD-10-CM | POA: Diagnosis not present

## 2019-01-07 ENCOUNTER — Encounter: Payer: Self-pay | Admitting: Gastroenterology

## 2019-01-21 ENCOUNTER — Telehealth: Payer: Self-pay | Admitting: Gastroenterology

## 2019-01-21 ENCOUNTER — Encounter: Payer: Self-pay | Admitting: Gastroenterology

## 2019-01-21 ENCOUNTER — Ambulatory Visit: Payer: Medicare HMO | Admitting: Gastroenterology

## 2019-01-21 ENCOUNTER — Other Ambulatory Visit: Payer: Self-pay

## 2019-01-21 VITALS — BP 154/93 | HR 93 | Temp 96.7°F | Ht 67.0 in | Wt 181.8 lb

## 2019-01-21 DIAGNOSIS — R1310 Dysphagia, unspecified: Secondary | ICD-10-CM | POA: Diagnosis not present

## 2019-01-21 DIAGNOSIS — D509 Iron deficiency anemia, unspecified: Secondary | ICD-10-CM | POA: Diagnosis not present

## 2019-01-21 DIAGNOSIS — R1319 Other dysphagia: Secondary | ICD-10-CM

## 2019-01-21 DIAGNOSIS — R935 Abnormal findings on diagnostic imaging of other abdominal regions, including retroperitoneum: Secondary | ICD-10-CM

## 2019-01-21 NOTE — Patient Instructions (Signed)
1. Please let me know if you decide to pursue further work up of your swallowing problems.  2. I have tried to call Dr. Kathaleen Grinder today regarding your thyroid concerns but his office is closed. I will try again tomorrow.

## 2019-01-21 NOTE — Progress Notes (Signed)
Primary Care Physician: Sinda Du, MD  Primary Gastroenterologist:  Barney Drain, MD   Chief Complaint  Patient presents with  . Dysphagia    HPI: Rhonda Bailey is a 73 y.o. female here for further evaluation of dysphagia.  When I saw the patient in January during hospitalization for severe diarrhea.  C. difficile quick scan negative x2, GI pathogen panel negative.  Had reported a positive fecal DNA test as an outpatient.  She underwent a ileocolonoscopy which showed normal terminal ileum, left-sided proctocolitis with some exudate versus partial pseudomembrane formation/erosion and ulceration.  Pathology was consistent with infectious etiology versus ischemia.  No features consistent with IBD.  Given negative stool studies and lack of response to antibiotic therapy, there was concern for early IBD clinically.  She received a short course of Entocort.  Patient states that her symptoms completely resolved.  She has been having regular bowel movements for months.  No melena or rectal bleeding.  No abdominal pain.  Presents today because of several week history of ongoing dysphagia to solids foods. Urge for throat clearing and cough, sensation of food sticking in the throat.  No heartburn.  No vomiting.  She has had similar symptoms 20 years ago when she was initially diagnosed with hyperthyroidism.  On her own she decided to increase her Synthroid for the past 3 weeks and feels like this is made a difference. States for several weeks she has had no further dysphagia.   CT imaging January 2020, 2 scans, with indeterminate left adrenal nodule with recommendation for follow-up adrenal protocol CT in 12 months, left-sided colitis.    Current Outpatient Medications  Medication Sig Dispense Refill  . cholecalciferol (VITAMIN D3) 25 MCG (1000 UT) tablet Take 1,000 Units by mouth daily.    Marland Kitchen levothyroxine (SYNTHROID, LEVOTHROID) 25 MCG tablet Take 25 mcg by mouth daily.      . Multiple  Vitamins-Minerals (MULTIVITAMIN ADULT PO) Take 1 tablet by mouth daily.    . pantoprazole (PROTONIX) 40 MG tablet Take 40 mg by mouth daily.     . Thiamine HCl (VITAMIN B-1 PO) Take 1 tablet by mouth daily.    . traZODone (DESYREL) 50 MG tablet Take 1 tablet by mouth at bedtime.     No current facility-administered medications for this visit.     Allergies as of 01/21/2019  . (No Known Allergies)    ROS:  General: Negative for anorexia, weight loss, fever, chills, fatigue, weakness. ENT: Negative for hoarseness, difficulty swallowing , nasal congestion. CV: Negative for chest pain, angina, palpitations, dyspnea on exertion, peripheral edema.  Respiratory: Negative for dyspnea at rest, dyspnea on exertion, cough, sputum, wheezing.  GI: See history of present illness. GU:  Negative for dysuria, hematuria, urinary incontinence, urinary frequency, nocturnal urination.  Endo: Negative for unusual weight change.    Physical Examination:   BP (!) 154/93   Pulse 93   Temp (!) 96.7 F (35.9 C) (Oral)   Ht 5\' 7"  (1.702 m)   Wt 181 lb 12.8 oz (82.5 kg)   BMI 28.47 kg/m   General: Well-nourished, well-developed in no acute distress.  Eyes: No icterus. Mouth: Oropharyngeal mucosa moist and pink , no lesions erythema or exudate. Lungs: Clear to auscultation bilaterally.  Heart: Regular rate and rhythm, no murmurs rubs or gallops.  Abdomen: Bowel sounds are normal, nontender, nondistended, no hepatosplenomegaly or masses, no abdominal bruits or hernia , no rebound or guarding.   Extremities: No lower extremity edema.  No clubbing or deformities. Neuro: Alert and oriented x 4   Skin: Warm and dry, no jaundice.   Psych: Alert and cooperative, normal mood and affect.  Labs:  Lab Results  Component Value Date   CREATININE 0.69 09/03/2018   BUN 5 (L) 09/03/2018   NA 141 09/03/2018   K 4.1 09/03/2018   CL 110 09/03/2018   CO2 25 09/03/2018   Lab Results  Component Value Date   WBC  10.6 (H) 09/03/2018   HGB 9.4 (L) 09/03/2018   HCT 32.9 (L) 09/03/2018   MCV 72.5 (L) 09/03/2018   PLT 572 (H) 09/03/2018   Lab Results  Component Value Date   IRON 5 (L) 08/28/2018   TIBC 236 (L) 08/28/2018   FERRITIN 67 08/28/2018   Lab Results  Component Value Date   ALT 12 08/30/2018   AST 11 (L) 08/30/2018   ALKPHOS 51 08/30/2018   BILITOT 0.7 08/30/2018    Imaging Studies: No results found.

## 2019-01-21 NOTE — Assessment & Plan Note (Signed)
Request recent labs from PCP.

## 2019-01-21 NOTE — Assessment & Plan Note (Signed)
Left-sided proctocolitis back in January with resolution of symptoms.  No longer on Entocort.  Etiology unclear, pathology not consistent with IBD, stool studies were negative.  We will continue to monitor for now.  She also had a left adrenal nodule requiring 1 year follow-up CT, will make arrangements.

## 2019-01-21 NOTE — Assessment & Plan Note (Signed)
73 year old female presenting with recent sensation of throat clearing, solid food dysphagia in the setting of well-controlled typical reflux symptoms.  Symptoms remind her how she felt prior to hypothyroidism diagnosis 20 years ago.  She empirically increased her Synthroid on her on reporting improvement near resolution of her symptoms.  She recalls having recent labs with normal TSH per PCP.  She requested to speak to Dr. Luan Pulling regarding her desire to increase her Synthroid dose.  We discussed options of barium esophagram versus EGD to evaluate her dysphagia, she declines at this time.  She prefers to monitor her symptoms as she currently has improved.  She will let me know if she decides to pursue work-up.

## 2019-01-21 NOTE — Telephone Encounter (Signed)
1. Please request recent labs from PCP. 2. Please NIC for CT abdomen adrenal protocol, 08/2019.  Diagnosis left adrenal nodule.

## 2019-01-22 ENCOUNTER — Ambulatory Visit: Payer: Medicare HMO | Admitting: Gastroenterology

## 2019-01-22 NOTE — Progress Notes (Signed)
CC'D TO PCP °

## 2019-01-22 NOTE — Telephone Encounter (Signed)
On recall for ct

## 2019-01-28 ENCOUNTER — Telehealth: Payer: Self-pay | Admitting: Gastroenterology

## 2019-01-28 NOTE — Telephone Encounter (Signed)
Pt called asking for LSL. I told her LSL wasn't in the office today. She said that LSL was going to speak with her PCP about her thyroid and get back with her, but she hasn't heard anything yet. Please advise and call [phone]

## 2019-01-28 NOTE — Telephone Encounter (Addendum)
Per [name]'s note she could not reach Dr. Luan Pulling one day and his office was closed. [name] is out of the office til 01/11/2019.  Rhonda Bailey will soon be running our of her Thyroid medication, Levothyroxine 25 mcg which she increased herself taking 1.5 tablets daily and feels much better. Said she has tried multiple times to call Dr. Luan Pulling office and can never get them. I called and the answering machine said their hours are 9-3 Monday through Thursday and right now closed on Fridays. I told her I will try calling them tomorrow morning and let her know.

## 2019-01-29 NOTE — Telephone Encounter (Signed)
I have faxed the letter/note to Dr. Luan Pulling along with the OV notes from Neil Crouch, [place] from 01/21/2019, for him to review and advise pt on her thyroid medication.   PT is aware that I have faxed Dr. Luan Pulling and she should be hearing from his office.

## 2019-01-29 NOTE — Telephone Encounter (Signed)
I called Dr. Kathaleen Grinder office and was placed on HOLD for 10 min. Had to hang up to take care of pts. I will fax a letter to him.

## 2019-01-30 NOTE — Telephone Encounter (Signed)
I spoke to Dr. Luan Pulling. He received the fax with [name]'s concern.  He said they will call pt and get her to check a TSH before he sends her prescription in.  I called and made pt aware.   [name], there were no labs in your box.  I printed off the last TSH in [facility] and the result was 0.647 on 11/26/2018 and placed in your box.  I will be on look out for updated lab.

## 2019-01-30 NOTE — Telephone Encounter (Signed)
Thank you Doris. Patient decided to increase her synthroid on her own and felt it made her dysphagia symptoms resolve as it did in the past when she was first diagnosed with thyroid issues. She declined EGD or BPE.   See if we have her labs in my box. If not, please request. Document any thyroid labs and date here. Keep labs in my box for review on my return. Thanks!

## 2019-01-31 NOTE — Telephone Encounter (Signed)
noted 

## 2019-02-03 ENCOUNTER — Encounter: Payer: Self-pay | Admitting: Gastroenterology

## 2019-02-03 DIAGNOSIS — E039 Hypothyroidism, unspecified: Secondary | ICD-10-CM | POA: Diagnosis not present

## 2019-02-05 NOTE — Telephone Encounter (Signed)
[name], I have placed a copy of updated lab in your box.  TSH on 02/04/2019 was 0.538.

## 2019-02-12 NOTE — Telephone Encounter (Signed)
noted 

## 2019-02-25 ENCOUNTER — Telehealth: Payer: Self-pay | Admitting: Gastroenterology

## 2019-02-25 DIAGNOSIS — R1310 Dysphagia, unspecified: Secondary | ICD-10-CM

## 2019-02-25 DIAGNOSIS — R1319 Other dysphagia: Secondary | ICD-10-CM

## 2019-02-25 NOTE — Telephone Encounter (Signed)
Pt called to say that she was seen by [facility] on 01/21/2019 and is still having problems with her throat and wants [facility] to take a look at her throat. I told her that [facility] wouldn't have any openings in the office until OCT but I would let the nurse be aware that she is still having problems and see what they would recommend. On the OV note it has she is a SF patient. Please advise and call her at [phone]

## 2019-02-26 NOTE — Addendum Note (Signed)
Addended by: Mahala Menghini on: 02/26/2019 12:19 PM   Modules accepted: Orders

## 2019-02-26 NOTE — Telephone Encounter (Signed)
Pt called back and said she prefers to do on Wed next week or the following week a Wed or Thurs.

## 2019-02-26 NOTE — Telephone Encounter (Signed)
Pt said she prefers to do the X ray first.

## 2019-02-26 NOTE — Telephone Encounter (Signed)
Does she want to consider xray to evaluate dysphagia OR EGD with esophageal dilation?

## 2019-02-26 NOTE — Telephone Encounter (Signed)
Order placed for BPE.   [facility] clinical pool, please schedule.

## 2019-02-26 NOTE — Telephone Encounter (Signed)
Pt said she was calling to let Neil Crouch, PA know that she wanted to follow up on her swallowing difficulty. She tried the medication but that is not helping the dysphagia. Forwarding to [place] to advise! ( Per [name], this is SF pt).

## 2019-02-26 NOTE — Telephone Encounter (Signed)
BPE scheduled for 03/05/19 at 9:30am, arrive at 9:15am. NPO 3 hours prior to test. Called and informed pt of appt. Letter mailed.

## 2019-02-26 NOTE — Telephone Encounter (Signed)
[facility] Clinical, please see dates requested.

## 2019-03-05 ENCOUNTER — Other Ambulatory Visit: Payer: Self-pay

## 2019-03-05 ENCOUNTER — Ambulatory Visit (HOSPITAL_COMMUNITY)
Admission: RE | Admit: 2019-03-05 | Discharge: 2019-03-05 | Disposition: A | Discharge status: Home / Self Care | Payer: Medicare HMO | Source: Ambulatory Visit | Attending: Gastroenterology | Admitting: Gastroenterology

## 2019-03-05 DIAGNOSIS — K449 Diaphragmatic hernia without obstruction or gangrene: Secondary | ICD-10-CM | POA: Diagnosis not present

## 2019-03-05 DIAGNOSIS — R1310 Dysphagia, unspecified: Secondary | ICD-10-CM | POA: Insufficient documentation

## 2019-03-05 DIAGNOSIS — R1319 Other dysphagia: Secondary | ICD-10-CM

## 2019-03-10 ENCOUNTER — Encounter: Payer: Self-pay | Admitting: Gastroenterology

## 2019-03-10 NOTE — Progress Notes (Signed)
ON RECALL FOR FU WITH [facility]

## 2019-03-19 ENCOUNTER — Other Ambulatory Visit (HOSPITAL_COMMUNITY): Payer: Self-pay | Admitting: Pulmonary Disease

## 2019-03-19 DIAGNOSIS — I10 Essential (primary) hypertension: Secondary | ICD-10-CM | POA: Diagnosis not present

## 2019-03-19 DIAGNOSIS — E876 Hypokalemia: Secondary | ICD-10-CM | POA: Diagnosis not present

## 2019-03-19 DIAGNOSIS — E039 Hypothyroidism, unspecified: Secondary | ICD-10-CM | POA: Diagnosis not present

## 2019-03-19 DIAGNOSIS — Z0000 Encounter for general adult medical examination without abnormal findings: Secondary | ICD-10-CM | POA: Diagnosis not present

## 2019-03-19 DIAGNOSIS — F419 Anxiety disorder, unspecified: Secondary | ICD-10-CM | POA: Diagnosis not present

## 2019-03-19 DIAGNOSIS — M8580 Other specified disorders of bone density and structure, unspecified site: Secondary | ICD-10-CM

## 2019-03-19 DIAGNOSIS — K210 Gastro-esophageal reflux disease with esophagitis: Secondary | ICD-10-CM | POA: Diagnosis not present

## 2019-03-19 LAB — BASIC METABOLIC PANEL
BUN: 16 (ref 4–21)
Creatinine: 0.7 (ref <=1.1)
Glucose: 101
Sodium: 142 (ref 137–147)

## 2019-03-19 LAB — LIPID PANEL
Cholesterol: 234 — AB (ref 0–200)
HDL: 51 (ref 35–70)
LDL Cholesterol: 157
Triglycerides: 130 (ref 40–160)

## 2019-03-19 LAB — CBC AND DIFFERENTIAL: WBC: 10

## 2019-03-19 LAB — TSH: TSH: 0.92 (ref <=5.90)

## 2019-03-31 ENCOUNTER — Other Ambulatory Visit (HOSPITAL_COMMUNITY): Payer: Medicare HMO

## 2019-04-02 ENCOUNTER — Other Ambulatory Visit (HOSPITAL_COMMUNITY): Payer: Medicare HMO

## 2019-05-05 DIAGNOSIS — R300 Dysuria: Secondary | ICD-10-CM | POA: Diagnosis not present

## 2019-05-08 ENCOUNTER — Other Ambulatory Visit: Payer: Self-pay

## 2019-05-08 ENCOUNTER — Inpatient Hospital Stay (HOSPITAL_COMMUNITY): Payer: Medicare HMO | Admitting: Anesthesiology

## 2019-05-08 ENCOUNTER — Inpatient Hospital Stay (HOSPITAL_COMMUNITY): Payer: Medicare HMO

## 2019-05-08 ENCOUNTER — Emergency Department (HOSPITAL_COMMUNITY): Payer: Medicare HMO

## 2019-05-08 ENCOUNTER — Inpatient Hospital Stay (HOSPITAL_COMMUNITY)
Admission: EM | Admit: 2019-05-08 | Discharge: 2019-05-11 | DRG: 854 | Disposition: A | Discharge status: Home / Self Care | Payer: Medicare HMO | Attending: Internal Medicine | Admitting: Internal Medicine

## 2019-05-08 ENCOUNTER — Encounter (HOSPITAL_COMMUNITY)
Admission: EM | Disposition: A | Discharge status: Home / Self Care | Payer: Self-pay | Source: Home / Self Care | Attending: Internal Medicine

## 2019-05-08 ENCOUNTER — Encounter (HOSPITAL_COMMUNITY): Payer: Self-pay

## 2019-05-08 DIAGNOSIS — N23 Unspecified renal colic: Secondary | ICD-10-CM | POA: Diagnosis not present

## 2019-05-08 DIAGNOSIS — F419 Anxiety disorder, unspecified: Secondary | ICD-10-CM | POA: Diagnosis present

## 2019-05-08 DIAGNOSIS — N201 Calculus of ureter: Secondary | ICD-10-CM | POA: Diagnosis not present

## 2019-05-08 DIAGNOSIS — D473 Essential (hemorrhagic) thrombocythemia: Secondary | ICD-10-CM | POA: Diagnosis not present

## 2019-05-08 DIAGNOSIS — D72829 Elevated white blood cell count, unspecified: Secondary | ICD-10-CM | POA: Diagnosis not present

## 2019-05-08 DIAGNOSIS — Z79899 Other long term (current) drug therapy: Secondary | ICD-10-CM

## 2019-05-08 DIAGNOSIS — R935 Abnormal findings on diagnostic imaging of other abdominal regions, including retroperitoneum: Secondary | ICD-10-CM | POA: Diagnosis not present

## 2019-05-08 DIAGNOSIS — R310 Gross hematuria: Secondary | ICD-10-CM | POA: Diagnosis not present

## 2019-05-08 DIAGNOSIS — R948 Abnormal results of function studies of other organs and systems: Secondary | ICD-10-CM | POA: Diagnosis present

## 2019-05-08 DIAGNOSIS — D75839 Thrombocytosis, unspecified: Secondary | ICD-10-CM | POA: Diagnosis present

## 2019-05-08 DIAGNOSIS — N200 Calculus of kidney: Secondary | ICD-10-CM | POA: Diagnosis not present

## 2019-05-08 DIAGNOSIS — B9620 Unspecified Escherichia coli [E. coli] as the cause of diseases classified elsewhere: Secondary | ICD-10-CM | POA: Diagnosis present

## 2019-05-08 DIAGNOSIS — N390 Urinary tract infection, site not specified: Secondary | ICD-10-CM | POA: Diagnosis present

## 2019-05-08 DIAGNOSIS — D649 Anemia, unspecified: Secondary | ICD-10-CM | POA: Diagnosis present

## 2019-05-08 DIAGNOSIS — N179 Acute kidney failure, unspecified: Secondary | ICD-10-CM | POA: Diagnosis not present

## 2019-05-08 DIAGNOSIS — R6520 Severe sepsis without septic shock: Secondary | ICD-10-CM | POA: Diagnosis present

## 2019-05-08 DIAGNOSIS — K5732 Diverticulitis of large intestine without perforation or abscess without bleeding: Secondary | ICD-10-CM | POA: Diagnosis not present

## 2019-05-08 DIAGNOSIS — E039 Hypothyroidism, unspecified: Secondary | ICD-10-CM | POA: Diagnosis not present

## 2019-05-08 DIAGNOSIS — R112 Nausea with vomiting, unspecified: Secondary | ICD-10-CM | POA: Diagnosis not present

## 2019-05-08 DIAGNOSIS — E279 Disorder of adrenal gland, unspecified: Secondary | ICD-10-CM | POA: Diagnosis present

## 2019-05-08 DIAGNOSIS — N136 Pyonephrosis: Secondary | ICD-10-CM | POA: Diagnosis not present

## 2019-05-08 DIAGNOSIS — Z20828 Contact with and (suspected) exposure to other viral communicable diseases: Secondary | ICD-10-CM | POA: Diagnosis present

## 2019-05-08 DIAGNOSIS — E876 Hypokalemia: Secondary | ICD-10-CM | POA: Diagnosis present

## 2019-05-08 DIAGNOSIS — R6889 Other general symptoms and signs: Secondary | ICD-10-CM | POA: Diagnosis present

## 2019-05-08 DIAGNOSIS — N12 Tubulo-interstitial nephritis, not specified as acute or chronic: Secondary | ICD-10-CM

## 2019-05-08 DIAGNOSIS — A419 Sepsis, unspecified organism: Principal | ICD-10-CM | POA: Diagnosis present

## 2019-05-08 DIAGNOSIS — R000 Tachycardia, unspecified: Secondary | ICD-10-CM | POA: Diagnosis not present

## 2019-05-08 DIAGNOSIS — E860 Dehydration: Secondary | ICD-10-CM | POA: Diagnosis not present

## 2019-05-08 DIAGNOSIS — Z79890 Hormone replacement therapy (postmenopausal): Secondary | ICD-10-CM

## 2019-05-08 DIAGNOSIS — Z1612 Extended spectrum beta lactamase (ESBL) resistance: Secondary | ICD-10-CM | POA: Diagnosis present

## 2019-05-08 DIAGNOSIS — N132 Hydronephrosis with renal and ureteral calculous obstruction: Secondary | ICD-10-CM | POA: Diagnosis not present

## 2019-05-08 DIAGNOSIS — R7989 Other specified abnormal findings of blood chemistry: Secondary | ICD-10-CM | POA: Diagnosis not present

## 2019-05-08 DIAGNOSIS — R319 Hematuria, unspecified: Secondary | ICD-10-CM | POA: Diagnosis not present

## 2019-05-08 DIAGNOSIS — N1330 Unspecified hydronephrosis: Secondary | ICD-10-CM | POA: Diagnosis not present

## 2019-05-08 HISTORY — PX: CYSTOSCOPY W/ URETERAL STENT PLACEMENT: SHX1429

## 2019-05-08 LAB — URINALYSIS, ROUTINE W REFLEX MICROSCOPIC
Bilirubin Urine: NEGATIVE
Glucose, UA: NEGATIVE mg/dL
Ketones, ur: NEGATIVE mg/dL
Nitrite: NEGATIVE
Protein, ur: 100 mg/dL — AB
Specific Gravity, Urine: 1.008 (ref 1.005–1.030)
WBC, UA: >50 WBC/hpf — ABNORMAL HIGH (ref 0–5)
pH: 6 (ref 5.0–8.0)

## 2019-05-08 LAB — CBC WITH DIFFERENTIAL/PLATELET
Abs Immature Granulocytes: 0.15 10*3/uL — ABNORMAL HIGH (ref 0.00–0.07)
Basophils Absolute: 0.1 10*3/uL (ref 0.0–0.1)
Basophils Relative: 0 %
Eosinophils Absolute: 0.1 10*3/uL (ref 0.0–0.5)
Eosinophils Relative: 0 %
HCT: 38.8 % (ref 36.0–46.0)
Hemoglobin: 11.7 g/dL — ABNORMAL LOW (ref 12.0–15.0)
Immature Granulocytes: 1 %
Lymphocytes Relative: 5 %
Lymphs Abs: 1.2 10*3/uL (ref 0.7–4.0)
MCH: 22.7 pg — ABNORMAL LOW (ref 26.0–34.0)
MCHC: 30.2 g/dL (ref 30.0–36.0)
MCV: 75.2 fL — ABNORMAL LOW (ref 80.0–100.0)
Monocytes Absolute: 1.5 10*3/uL — ABNORMAL HIGH (ref 0.1–1.0)
Monocytes Relative: 6 %
Neutro Abs: 21.2 10*3/uL — ABNORMAL HIGH (ref 1.7–7.7)
Neutrophils Relative %: 88 %
Platelets: 412 10*3/uL — ABNORMAL HIGH (ref 150–400)
RBC: 5.16 MIL/uL — ABNORMAL HIGH (ref 3.87–5.11)
RDW: 17.4 % — ABNORMAL HIGH (ref 11.5–15.5)
WBC: 24.2 10*3/uL — ABNORMAL HIGH (ref 4.0–10.5)
nRBC: 0 % (ref 0.0–0.2)

## 2019-05-08 LAB — COMPREHENSIVE METABOLIC PANEL
ALT: 19 U/L (ref 0–44)
AST: 27 U/L (ref 15–41)
Albumin: 3.1 g/dL — ABNORMAL LOW (ref 3.5–5.0)
Alkaline Phosphatase: 81 U/L (ref 38–126)
Anion gap: 13 (ref 5–15)
BUN: 18 mg/dL (ref 8–23)
CO2: 23 mmol/L (ref 22–32)
Calcium: 8.4 mg/dL — ABNORMAL LOW (ref 8.9–10.3)
Chloride: 97 mmol/L — ABNORMAL LOW (ref 98–111)
Creatinine, Ser: 1.54 mg/dL — ABNORMAL HIGH (ref 0.44–1.00)
GFR calc Af Amer: 38 mL/min — ABNORMAL LOW (ref >60)
GFR calc non Af Amer: 33 mL/min — ABNORMAL LOW (ref >60)
Glucose, Bld: 200 mg/dL — ABNORMAL HIGH (ref 70–99)
Potassium: 3.5 mmol/L (ref 3.5–5.1)
Sodium: 133 mmol/L — ABNORMAL LOW (ref 135–145)
Total Bilirubin: 0.8 mg/dL (ref 0.3–1.2)
Total Protein: 6.7 g/dL (ref 6.5–8.1)

## 2019-05-08 LAB — MRSA PCR SCREENING: MRSA by PCR: NEGATIVE

## 2019-05-08 LAB — LACTIC ACID, PLASMA
Lactic Acid, Venous: 6.4 mmol/L (ref 0.5–1.9)
Lactic Acid, Venous: 1.6 mmol/L (ref 0.5–1.9)

## 2019-05-08 LAB — HEMOGLOBIN A1C
Hgb A1c MFr Bld: 6.2 % — ABNORMAL HIGH (ref 4.8–5.6)
Mean Plasma Glucose: 131.24 mg/dL

## 2019-05-08 LAB — SARS CORONAVIRUS 2 BY RT PCR (HOSPITAL ORDER, PERFORMED IN ~~LOC~~ HOSPITAL LAB): SARS Coronavirus 2: NEGATIVE

## 2019-05-08 SURGERY — CYSTOSCOPY, WITH RETROGRADE PYELOGRAM AND URETERAL STENT INSERTION
Anesthesia: Monitor Anesthesia Care | Laterality: Right

## 2019-05-08 MED ORDER — ENOXAPARIN SODIUM 40 MG/0.4ML ~~LOC~~ SOLN
40.0000 mg | SUBCUTANEOUS | Status: DC
  Administered 2019-05-09: 40 mg via SUBCUTANEOUS
  Filled 2019-05-08: qty 0.4

## 2019-05-08 MED ORDER — STERILE WATER FOR IRRIGATION IR SOLN
Status: DC | PRN
  Administered 2019-05-08: 500 mL

## 2019-05-08 MED ORDER — CHLORHEXIDINE GLUCONATE CLOTH 2 % EX PADS
6.0000 | MEDICATED_PAD | Freq: Every day | CUTANEOUS | Status: DC
  Administered 2019-05-08 – 2019-05-10 (×3): 6 via TOPICAL

## 2019-05-08 MED ORDER — PROMETHAZINE HCL 25 MG/ML IJ SOLN: 6.2500 mg | INTRAMUSCULAR | Status: DC | PRN

## 2019-05-08 MED ORDER — ONDANSETRON HCL 4 MG/2ML IJ SOLN
INTRAMUSCULAR | Status: DC | PRN
  Administered 2019-05-08: 4 mg via INTRAVENOUS

## 2019-05-08 MED ORDER — SODIUM CHLORIDE 0.9 % IV SOLN
INTRAVENOUS | Status: DC
  Administered 2019-05-08 – 2019-05-10 (×6): via INTRAVENOUS

## 2019-05-08 MED ORDER — HYDROMORPHONE HCL 1 MG/ML IJ SOLN
0.5000 mg | INTRAMUSCULAR | Status: DC | PRN
  Administered 2019-05-09 – 2019-05-11 (×3): 0.5 mg via INTRAVENOUS
  Filled 2019-05-08 (×3): qty 0.5

## 2019-05-08 MED ORDER — BISACODYL 5 MG PO TBEC
5.0000 mg | DELAYED_RELEASE_TABLET | Freq: Every day | ORAL | Status: DC | PRN
  Filled 2019-05-08: qty 1

## 2019-05-08 MED ORDER — SODIUM CHLORIDE 0.9 % IV BOLUS
1000.0000 mL | Freq: Once | INTRAVENOUS | Status: AC
  Administered 2019-05-08: 1000 mL via INTRAVENOUS

## 2019-05-08 MED ORDER — SODIUM CHLORIDE 0.9 % IV SOLN
2.0000 g | INTRAVENOUS | Status: DC
  Administered 2019-05-09 – 2019-05-11 (×3): 2 g via INTRAVENOUS
  Filled 2019-05-08 (×3): qty 20

## 2019-05-08 MED ORDER — PROMETHAZINE HCL 25 MG/ML IJ SOLN
12.5000 mg | Freq: Once | INTRAMUSCULAR | Status: AC
  Administered 2019-05-08: 12.5 mg via INTRAVENOUS
  Filled 2019-05-08: qty 1

## 2019-05-08 MED ORDER — KETAMINE HCL 10 MG/ML IJ SOLN
INTRAMUSCULAR | Status: DC | PRN
  Administered 2019-05-08: 5 mg via INTRAVENOUS
  Administered 2019-05-08: 15 mg via INTRAVENOUS

## 2019-05-08 MED ORDER — ACETAMINOPHEN 325 MG PO TABS
650.0000 mg | ORAL_TABLET | Freq: Four times a day (QID) | ORAL | Status: DC
  Administered 2019-05-08 – 2019-05-11 (×12): 650 mg via ORAL
  Filled 2019-05-08 (×12): qty 2

## 2019-05-08 MED ORDER — DIATRIZOATE MEGLUMINE 30 % UR SOLN
URETHRAL | Status: DC | PRN
  Administered 2019-05-08: 8 mL via URETHRAL

## 2019-05-08 MED ORDER — ONDANSETRON HCL 4 MG/2ML IJ SOLN
4.0000 mg | Freq: Four times a day (QID) | INTRAMUSCULAR | Status: DC | PRN
  Administered 2019-05-09 – 2019-05-10 (×3): 4 mg via INTRAVENOUS
  Filled 2019-05-08 (×3): qty 2

## 2019-05-08 MED ORDER — MIDAZOLAM HCL 2 MG/2ML IJ SOLN: 0.5000 mg | Freq: Once | INTRAMUSCULAR | Status: DC | PRN

## 2019-05-08 MED ORDER — SODIUM CHLORIDE 0.9 % IV SOLN
1.0000 g | Freq: Once | INTRAVENOUS | Status: AC
  Administered 2019-05-08: 1 g via INTRAVENOUS
  Filled 2019-05-08: qty 10

## 2019-05-08 MED ORDER — ACETAMINOPHEN 650 MG RE SUPP
650.0000 mg | Freq: Four times a day (QID) | RECTAL | Status: DC
  Filled 2019-05-08 (×4): qty 1

## 2019-05-08 MED ORDER — SODIUM CHLORIDE 0.9 % IV BOLUS
500.0000 mL | Freq: Once | INTRAVENOUS | Status: AC
  Administered 2019-05-08: 500 mL via INTRAVENOUS

## 2019-05-08 MED ORDER — ONDANSETRON HCL 4 MG/2ML IJ SOLN
4.0000 mg | Freq: Once | INTRAMUSCULAR | Status: AC
  Administered 2019-05-08: 4 mg via INTRAVENOUS
  Filled 2019-05-08: qty 2

## 2019-05-08 MED ORDER — LEVOTHYROXINE SODIUM 75 MCG PO TABS
37.5000 ug | ORAL_TABLET | Freq: Every day | ORAL | Status: DC
  Administered 2019-05-09 – 2019-05-11 (×3): 37.5 ug via ORAL
  Filled 2019-05-08 (×3): qty 1

## 2019-05-08 MED ORDER — LACTATED RINGERS IV SOLN
INTRAVENOUS | Status: DC
  Administered 2019-05-08 (×2): via INTRAVENOUS

## 2019-05-08 MED ORDER — SODIUM CHLORIDE 0.9 % IV BOLUS: 1000.0000 mL | Freq: Once | INTRAVENOUS | Status: DC

## 2019-05-08 MED ORDER — LIDOCAINE 2% (20 MG/ML) 5 ML SYRINGE
INTRAMUSCULAR | Status: AC
  Filled 2019-05-08: qty 5

## 2019-05-08 MED ORDER — KETAMINE HCL 50 MG/5ML IJ SOSY
PREFILLED_SYRINGE | INTRAMUSCULAR | Status: AC
  Filled 2019-05-08: qty 5

## 2019-05-08 MED ORDER — PANTOPRAZOLE SODIUM 40 MG PO TBEC
40.0000 mg | DELAYED_RELEASE_TABLET | Freq: Every day | ORAL | Status: DC
  Administered 2019-05-09 – 2019-05-11 (×3): 40 mg via ORAL
  Filled 2019-05-08 (×3): qty 1

## 2019-05-08 MED ORDER — PROPOFOL 10 MG/ML IV BOLUS
INTRAVENOUS | Status: AC
  Filled 2019-05-08: qty 20

## 2019-05-08 MED ORDER — LIDOCAINE VISCOUS HCL 2 % MT SOLN
OROMUCOSAL | Status: AC
  Filled 2019-05-08: qty 15

## 2019-05-08 MED ORDER — ONDANSETRON HCL 4 MG PO TABS
4.0000 mg | ORAL_TABLET | Freq: Four times a day (QID) | ORAL | Status: DC | PRN
  Filled 2019-05-08: qty 1

## 2019-05-08 MED ORDER — PROPOFOL 10 MG/ML IV BOLUS
INTRAVENOUS | Status: AC
  Filled 2019-05-08: qty 40

## 2019-05-08 MED ORDER — PROPOFOL 10 MG/ML IV BOLUS
INTRAVENOUS | Status: DC | PRN
  Administered 2019-05-08: 20 mg via INTRAVENOUS
  Administered 2019-05-08: 40 mg via INTRAVENOUS
  Administered 2019-05-08: 20 mg via INTRAVENOUS
  Administered 2019-05-08 (×2): 40 mg via INTRAVENOUS
  Administered 2019-05-08: 20 mg via INTRAVENOUS

## 2019-05-08 MED ORDER — TRAZODONE HCL 50 MG PO TABS
50.0000 mg | ORAL_TABLET | Freq: Every day | ORAL | Status: DC
  Administered 2019-05-09: 50 mg via ORAL
  Filled 2019-05-08 (×2): qty 1

## 2019-05-08 MED ORDER — DIATRIZOATE MEGLUMINE 30 % UR SOLN
URETHRAL | Status: AC
  Filled 2019-05-08: qty 100

## 2019-05-08 MED ORDER — SODIUM CHLORIDE 0.9 % IR SOLN
Status: DC | PRN
  Administered 2019-05-08: 3000 mL

## 2019-05-08 SURGICAL SUPPLY — 19 items
BAG DRAIN URO TABLE W/ADPT NS (BAG) ×2 IMPLANT
BAG DRN 8 ADPR NS SKTRN CSTL (BAG) ×1
BAG HAMPER (MISCELLANEOUS) ×2 IMPLANT
CATH URET 5FR 28IN OPEN ENDED (CATHETERS) IMPLANT
CLOTH BEACON ORANGE TIMEOUT ST (SAFETY) ×2 IMPLANT
GLOVE BIO SURGEON STRL SZ7.5 (GLOVE) ×2 IMPLANT
GLOVE BIOGEL PI IND STRL 7.0 (GLOVE) ×2 IMPLANT
GLOVE BIOGEL PI INDICATOR 7.0 (GLOVE) ×3
GOWN STRL REUS W/TWL LRG LVL3 (GOWN DISPOSABLE) ×2 IMPLANT
GUIDEWIRE ANG ZIPWIRE 038X150 (WIRE) ×1 IMPLANT
GUIDEWIRE STR DUAL SENSOR (WIRE) ×2 IMPLANT
IV NS IRRIG 3000ML ARTHROMATIC (IV SOLUTION) ×3 IMPLANT
KIT TURNOVER CYSTO (KITS) ×2 IMPLANT
MANIFOLD NEPTUNE II (INSTRUMENTS) ×4 IMPLANT
NS IRRIG 500ML POUR BTL (IV SOLUTION) ×2 IMPLANT
PACK CYSTO (CUSTOM PROCEDURE TRAY) ×2 IMPLANT
PAD ARMBOARD 7.5X6 YLW CONV (MISCELLANEOUS) ×2 IMPLANT
STENT URET 6FRX24 CONTOUR (STENTS) ×1 IMPLANT
TRAY FOLEY MTR SLVR 16FR STAT (SET/KITS/TRAYS/PACK) ×1 IMPLANT

## 2019-05-08 NOTE — Anesthesia Preprocedure Evaluation (Signed)
Anesthesia Evaluation  Patient identified by MRN, date of birth, ID band Patient awake    Reviewed: Allergy & Precautions, NPO status , Patient's Chart, lab work & pertinent test results  Airway Mallampati: III  TM Distance: >3 FB Neck ROM: Full    Dental no notable dental hx. (+) Teeth Intact   Pulmonary neg pulmonary ROS,    Pulmonary exam normal breath sounds clear to auscultation       Cardiovascular Exercise Tolerance: Good negative cardio ROS Normal cardiovascular examI Rhythm:Regular Rate:Normal     Neuro/Psych Anxiety negative neurological ROS  negative psych ROS   GI/Hepatic negative GI ROS, Neg liver ROS,   Endo/Other  Hypothyroidism   Renal/GU Renal InsufficiencyRenal diseaseHere for cysto/RGPG stent  negative genitourinary   Musculoskeletal negative musculoskeletal ROS (+)   Abdominal   Peds negative pediatric ROS (+)  Hematology negative hematology ROS (+) anemia ,   Anesthesia Other Findings   Reproductive/Obstetrics negative OB ROS                             Anesthesia Physical Anesthesia Plan  ASA: II and emergent  Anesthesia Plan: MAC   Post-op Pain Management:    Induction: Intravenous  PONV Risk Score and Plan: 2 and TIVA, Propofol infusion, Ondansetron and Treatment may vary due to age or medical condition  Airway Management Planned: Nasal Cannula and Simple Face Mask  Additional Equipment:   Intra-op Plan:   Post-operative Plan: Extubation in OR  Informed Consent: I have reviewed the patients History and Physical, chart, labs and discussed the procedure including the risks, benefits and alternatives for the proposed anesthesia with the patient or authorized representative who has indicated his/her understanding and acceptance.     Dental advisory given  Plan Discussed with: CRNA  Anesthesia Plan Comments: (Plan Full PPE use  Plan MAC -deep  with  GETA as needed d/w pt -WTP with same after Q&A)        Anesthesia Quick Evaluation

## 2019-05-08 NOTE — Op Note (Addendum)
Preoperative diagnosis: Right ureteral stone, sepsis Postoperative diagnosis: Same  Procedure: Cystoscopy, right retrograde pyelogram, right ureteral stent placement  Surgeon: Junious Silk  Anesthesia: MAC  Indication for procedure: Ms. Rhonda Bailey is a 73 year old white female who has a septic picture with leukocytosis, tachycardia and elevated lactic acid.  She had a 6 mm right mid ureteral stone and an edematous kidney was brought for urgent stent  Findings: No stone in the bladder.  Urethra and bladder otherwise unremarkable.  Right retrograde pyelogram-this outlined a single ureter single collecting system unit.  There was a small filling defect in the mid ureter consistent with the stone and some mild to moderate upstream hydroureteronephrosis.  Description of procedure: After consent was obtained patient brought to the operating room.  After adequate anesthesia she was placed lithotomy position and prepped and draped in the usual sterile fashion.  A timeout was performed to confirm the patient and procedure.  The cystoscope was passed per urethra and the bladder inspected.  The right ureteral orifice was cannulated with a 6 [place] open-ended catheter and retrograde injection of contrast was performed.  A sensor wire was advanced and coiled in the upper pole collecting system.  Over the sensor wire 6 x 24 cm stent was advanced.  The wire was removed with the upper coil in the upper pole calyx and a good coil in the bladder.  The scope was removed and a Foley catheter was placed to max drain the system overnight.  She was awakened and taken to the cover room in stable condition.  Complications: None  Blood loss: Minimal  Specimens: None  Drains: 6 x 24 cm right ureteral stent, 16 French Foley catheter  Disposition: Patient stable to PACU - I called and spoke with Melissa post-op and went over the procedure and importance of f/u with the stent

## 2019-05-08 NOTE — ED Triage Notes (Signed)
Pt c/o left flank pain and frequent urination since Monday.  Reports went to urgent care and was told she didn't have a uti but may have a kidney stone.

## 2019-05-08 NOTE — Anesthesia Postprocedure Evaluation (Signed)
Anesthesia Post Note  Patient: Rhonda Bailey  Procedure(s) Performed: CYSTOSCOPY WITH RETROGRADE PYELOGRAM/URETERAL STENT PLACEMENT (Right )  Patient location during evaluation: PACU Anesthesia Type: MAC Level of consciousness: awake and alert and oriented Pain management: pain level controlled Vital Signs Assessment: post-procedure vital signs reviewed and stable Respiratory status: spontaneous breathing Cardiovascular status: blood pressure returned to baseline and stable Postop Assessment: no apparent nausea or vomiting Anesthetic complications: no     Last Vitals:  Vitals:   05/08/19 1716 05/08/19 1730  BP: (!) 133/49 (P) 123/60  Pulse: (!) 120 (!) 118  Resp: (!) 33 (!) 27  Temp: 37.6 C   SpO2: 98% 99%    Last Pain:  Vitals:   05/08/19 1716  TempSrc:   PainSc: 3                  Stacey Maura

## 2019-05-08 NOTE — Discharge Instructions (Signed)
Ureteral Stent Implantation, Care After This sheet gives you information about how to care for yourself after your procedure. Your health care provider may also give you more specific instructions. If you have problems or questions, contact your health care provider.  Removal of the stent/stone-be sure to follow-up with [facility] to plan stent/stone removal-call for appointment [phone]  What can I expect after the procedure? After the procedure, it is common to have:  Nausea.  Mild pain when you urinate. You may feel this pain in your lower back or lower abdomen. The pain should stop within a few minutes after you urinate. This may last for up to 1 week.  A small amount of blood in your urine for several days. Follow these instructions at home: Medicines  Take over-the-counter and prescription medicines only as told by your health care provider.  If you were prescribed an antibiotic medicine, take it as told by your health care provider. Do not stop taking the antibiotic even if you start to feel better.  Do not drive for 24 hours if you were given a sedative during your procedure.  Ask your health care provider if the medicine prescribed to you requires you to avoid driving or using heavy machinery. Activity  Rest as told by your health care provider.  Avoid sitting for a long time without moving. Get up to take short walks every 1-2 hours. This is important to improve blood flow and breathing. Ask for help if you feel weak or unsteady.  Return to your normal activities as told by your health care provider. Ask your health care provider what activities are safe for you. General instructions   Watch for any blood in your urine. Call your health care provider if the amount of blood in your urine increases.  If you have a catheter: ? Follow instructions from your health care provider about taking care of your catheter and collection bag. ? Do not take baths,  swim, or use a hot tub until your health care provider approves. Ask your health care provider if you may take showers. You may only be allowed to take sponge baths.  Drink enough fluid to keep your urine pale yellow.  Do not use any products that contain nicotine or tobacco, such as cigarettes, e-cigarettes, and chewing tobacco. These can delay healing after surgery. If you need help quitting, ask your health care provider.  Keep all follow-up visits as told by your health care provider. This is important. Contact a health care provider if:  You have pain that gets worse or does not get better with medicine, especially pain when you urinate.  You have difficulty urinating.  You feel nauseous or you vomit repeatedly during a period of more than 2 days after the procedure. Get help right away if:  Your urine is dark red or has blood clots in it.  You are leaking urine (have incontinence).  The end of the stent comes out of your urethra.  You cannot urinate.  You have sudden, sharp, or severe pain in your abdomen or lower back.  You have a fever.  You have swelling or pain in your legs.  You have difficulty breathing. Summary  After the procedure, it is common to have mild pain when you urinate that goes away within a few minutes after you urinate. This may last for up to 1 week.  Watch for any blood in your urine. Call your health care provider if the amount of blood  in your urine increases.  Take over-the-counter and prescription medicines only as told by your health care provider.  Drink enough fluid to keep your urine pale yellow. This information is not intended to replace advice given to you by your health care provider. Make sure you discuss any questions you have with your health care provider. Document Released: 03/26/2013 Document Revised: 04/30/2018 Document Reviewed: 05/01/2018 Elsevier Patient Education  2020 [employer].

## 2019-05-08 NOTE — H&P (Signed)
History and Physical  INAARA TYE YIR:[id number] DOB: 04-17-1946 DOA: 05/08/2019  Referring physician: Melina Copa, MD   PCP: Sinda Du, MD   Chief Complaint: right flank pain   HPI: Rhonda Bailey is a 73 y.o. female went to the urgent care several days ago because she has been having severe right flank pain since 05/05/2019.  She was told that she did not have a UTI but there was a chance that she had a kidney stone.  She has been having gross hematuria.  She is also been having urinary frequency.  She has had some nausea and some vomiting.  She overall feels well.  She has no chest pain.  She did not have fever but since she has been in the emergency department she started to have rigors.  In the ED she was noted to be clinically dehydrated with a sinus tachycardia and elevated lactic acid of 6.4.  She was started on IV fluids.  Blood cultures obtained and started on IV ceftriaxone.  She was given an additional dose of ceftriaxone when she started with the rigors to cover for suspected bacteremia.    She had a CT stone study with findings of a large 6 x 4 mm right ureteral stone with subsequent hydronephrosis.  Urology Dr. Junious Silk was consulted who felt that patient likely is going to require operative management later today with plans for stent placement later today.  Patient is being admitted to the medical service for further treatment of severe sepsis secondary to this large ureteral stone.  Review of Systems: All systems reviewed and apart from history of presenting illness, are negative.  Past Medical History:  Diagnosis Date  . Anxiety   . Hypothyroidism    Past Surgical History:  Procedure Laterality Date  . BIOPSY  09/02/2018   Procedure: BIOPSY;  Surgeon: Daneil Dolin, MD;  Location: [facility];  Service: Endoscopy;;  colon   . CHOLECYSTECTOMY  05/19/2011   Procedure: LAPAROSCOPIC CHOLECYSTECTOMY;  Surgeon: Jamesetta So;  Location: [facility] ORS;  Service: General;   Laterality: N/A;  . COLONOSCOPY WITH PROPOFOL N/A 09/02/2018   Dr. Gala Romney: Proctocolitis limited to the left side with some exudate versus partial pseudomembrane formation, erosions and ulceration somewhat superficial.  Terminal ileum normal.  Diverticulosis.  Descending/sigmoid colon biopsy showing severe active colitis, diagnostic features of IBD not identified.  Differential includes infection, ischemia.  Rectal biopsies with granulation tissue c/w ulcer  . TUBAL LIGATION     Social History:  reports that she has never smoked. She has never used smokeless tobacco. She reports that she does not drink alcohol or use drugs.  Allergies  Allergen Reactions  . Tramadol     Says "blew my head up."    Family History  Problem Relation Age of Onset  . Anesthesia problems Neg Hx   . Hypotension Neg Hx   . Malignant hyperthermia Neg Hx   . Pseudochol deficiency Neg Hx     Prior to Admission medications   Medication Sig Start Date End Date Taking? Authorizing Provider  cholecalciferol (VITAMIN D3) 25 MCG (1000 UT) tablet Take 1,000 Units by mouth daily.    [provider]  levothyroxine (SYNTHROID, LEVOTHROID) 25 MCG tablet Take 37.5 mcg by mouth daily.     [provider]  Multiple Vitamins-Minerals (MULTIVITAMIN ADULT PO) Take 1 tablet by mouth daily.    [provider]  pantoprazole (PROTONIX) 40 MG tablet Take 40 mg by mouth daily.  08/17/18  [provider]  Thiamine HCl (VITAMIN B-1 PO) Take 1 tablet by mouth daily.    [provider]  traMADol (ULTRAM) 50 MG tablet Take 1-2 tablets by mouth every 6 (six) hours as needed. 05/05/19   [provider]  traZODone (DESYREL) 50 MG tablet Take 1 tablet by mouth at bedtime. 08/25/18   [provider]   Physical Exam: Vitals:   05/08/19 0945 05/08/19 0946 05/08/19 1420  BP:  123/67 (!) 150/61  Pulse:  (!) 107 (!) 125  Resp:  14 (!) 24  Temp:  98 F (36.7 C) 99.6 F (37.6 C)   TempSrc:  Oral Oral  SpO2:  97% 96%  Weight: 79.4 kg    Height: 5' 8"  (1.727 m)       General exam: Moderately built and nourished patient, lying comfortably supine on the gurney in no obvious distress.  Patient appears ill.  Patient is in moderate distress.  Head, eyes and ENT: Nontraumatic and normocephalic. Pupils equally reacting to light and accommodation. Oral mucosa very dry.  Neck: Supple. No JVD, carotid bruit or thyromegaly.  Lymphatics: No lymphadenopathy.  Respiratory system: Clear to auscultation. No increased work of breathing.  Cardiovascular system: S1 and S2 heard, tachycardic. No JVD, murmurs, gallops, clicks or pedal edema.  Gastrointestinal system: Abdomen is nondistended, soft and nontender. Normal bowel sounds heard. No organomegaly or masses appreciated.  Right flank pain.  Central nervous system: Lethargic but arousable and oriented. No focal neurological deficits.  Extremities: Symmetric 5 x 5 power. Peripheral pulses symmetrically felt.   Skin: No rashes or acute findings.  Musculoskeletal system: Negative exam.  Psychiatry: Pleasant and cooperative.  Labs on Admission:  Basic Metabolic Panel: Recent Labs  Lab 05/08/19 1013  NA 133*  K 3.5  CL 97*  CO2 23  GLUCOSE 200*  BUN 18  CREATININE 1.54*  CALCIUM 8.4*   Liver Function Tests: Recent Labs  Lab 05/08/19 1013  AST 27  ALT 19  ALKPHOS 81  BILITOT 0.8  PROT 6.7  ALBUMIN 3.1*   No results for input(s): LIPASE, AMYLASE in the last 168 hours. No results for input(s): AMMONIA in the last 168 hours. CBC: Recent Labs  Lab 05/08/19 1013  WBC 24.2*  NEUTROABS 21.2*  HGB 11.7*  HCT 38.8  MCV 75.2*  PLT 412*   Cardiac Enzymes: No results for input(s): CKTOTAL, CKMB, CKMBINDEX, TROPONINI in the last 168 hours.  BNP (last 3 results) No results for input(s): PROBNP in the last 8760 hours. CBG: No results for input(s): GLUCAP in the last 168 hours.  Radiological Exams on  Admission: Ct Renal Stone Study  Result Date: 05/08/2019 CLINICAL DATA:  Flank pain and hematuria EXAM: CT ABDOMEN AND PELVIS WITHOUT CONTRAST TECHNIQUE: Multidetector CT imaging of the abdomen and pelvis was performed following the standard protocol without oral or IV contrast. COMPARISON:  August 29, 2018 FINDINGS: Lower chest: There is no lung base edema or consolidation. There is a small hiatal hernia. Hepatobiliary: There are scattered tiny calcifications in the liver, likely indicative of prior granulomatous disease. Beyond the small calcifications, no focal liver lesions are evident. Gallbladder is absent. There is no appreciable biliary duct dilatation. Pancreas: There is fatty infiltration in portions of pancreas. No pancreatic mass or inflammatory focus evident. Spleen: No splenic lesions appreciable. Adrenals/Urinary Tract: Right adrenal appears normal. There is a stable left adrenal mass measuring 2.2 x 2.0 cm, indeterminate by CT criteria. Right kidney is mildly edematous. There is no evident  pelvic mass on either side. There is mild hydronephrosis on the right. There is no appreciable hydronephrosis on the left. There is no intrarenal calculus on either side. There is a calculus in the right ureter at the mid sacral level measuring 6 x 4 mm. No other ureteral calculi are evident. The wall of the urinary bladder is thickened diffusely. There is mild mesenteric stranding adjacent to the urinary bladder. Stomach/Bowel: There are multiple sigmoid diverticula. There is slight thickening of the adjacent mesentery at the levels of the distal descending colon and proximal sigmoid colon. Suspect mild and potentially chronic diverticulitis. No associated fluid. No abscess or perforation. There is no appreciable bowel wall or mesenteric thickening. No evident bowel obstruction. Terminal ileum appears normal. No evident free air or portal venous air. Vascular/Lymphatic: No abdominal aortic aneurysm. There are  foci of aortic atherosclerosis. There is no appreciable adenopathy in the abdomen or pelvis. Reproductive: Uterus is midline.  No pelvic masses are evident. Other: The appendix appears normal. There is no appreciable abscess or ascites in the abdomen or pelvis. Slight fat is noted in the umbilicus. Musculoskeletal: There is spinal stenosis at L4-5 due to diffuse disc protrusion and bony hypertrophy. There is degenerative change at multiple sites in the lower thoracic and lumbar regions. No blastic or lytic bone lesions evident. No intramuscular lesions are appreciable. IMPRESSION: 1. 6 x 4 mm calculus in the right ureter at the mid sacral level causing mild hydronephrosis on the right. Right kidney is subtly edematous. 2. Urinary bladder wall is diffusely thickened. There is soft tissue stranding adjacent to the urinary bladder, likely of inflammatory etiology. 3. Extensive sigmoid and distal descending colonic diverticulosis. Mild mesenteric thickening at the level of the distal descending colon and proximal sigmoid colon may represent mild and potentially chronic diverticulitis. Somewhat similar changes noted on prior CT. No fluid in this area of suspected mild inflammation of the distal descending and proximal sigmoid colon. No perforation or abscess. 4. Spinal stenosis at L4-5 due to diffuse disc protrusion and bony hypertrophy. 5. Stable left adrenal mass. This mass has been followed with stability for less than 1 year. A lesion of this nature should be followed without change for a minimum of 1 year. In this regard, a follow-up CT in 1 year to confirm stability of the left adrenal mass advised. Note that based on attenuation criteria, it is an indeterminate category. 6. No bowel obstruction. No abscess in the abdomen or pelvis. Appendix appears normal. 7.  Small hiatal hernia. Electronically Signed   By: Lowella Grip III M.D.   On: 05/08/2019 11:12   Assessment/Plan Principal Problem:   Severe sepsis  (HCC) Active Problems:   Leukocytosis   Abnormal CT of the abdomen   Nephrolithiasis   Rigors   Hypothyroidism   UTI (urinary tract infection)   Gross hematuria   Renal colic on right side   Hydronephrosis of right kidney   Dehydration   AKI (acute kidney injury) ([place])   Thrombocytosis ([place])  1. Severe sepsis-secondary to UTI complicated by a large right ureteral stone- I have increased the ceftriaxone to 2 g IV every 24 hours.  Patient was given additional dose of IV ceftriaxone in the emergency department to make it 2 g given.  She may need antibiotics changed to meropenem given the high likelihood that this is an E. coli infection.  However will wait on the culture results.  Continue aggressive IV fluid hydration and follow lactic acid trend.  She  is being admitted to the stepdown ICU given her potential for decompensation. 2. Large right ureteral stone-appreciate urology consult and plans for operative management with stent placement later today.  We will try to maximize medical treatment in anticipation for procedure later today.  Further recommendations per Dr. Junious Silk. 3. AKI-secondary to severe dehydration and treating with IV fluids.  Aggressive hydration ordered.  Bolusing IV fluids to get lactic acid trend down.  And then additional IV fluid hydration ordered after that. 4. Thrombocytosis-likely secondary to severe sepsis and plan to follow. 5. Leukocytosis-secondary to UTI and sepsis plan to trend WBC with differential daily. 6. UTI- initial treatment with ceftriaxone 2 g IV with low threshold to change to meropenem if we find evidence of E. coli infection.  There is a high prevalence of ESBL E. coli infection in this area. 7. Gross hematuria-secondary to ureteral lithiasis-planning for stent placement later today with Dr. Junious Silk. 8. Chronic diverticulitis- noted in the CT abdomen report but findings seem to be stable from other tests. 9. Hydronephrosis of the right  kidney-secondary to ureterolithiasis which is going to be treated with stent placement. 10. Nausea and vomiting-secondary to above, symptomatic treatment with antinausea medications.  DVT Prophylaxis: Lovenox Code Status: Full Family Communication: Patient updated at bedside and friend at bedside updated, verbalized understanding Disposition Plan: Admission to stepdown ICU for intensive medical therapies and operative management as noted above  Critical care time spent: 65 minutes  Alfonso Carden Wynetta Emery, MD [facility] How to contact the [facility] Attending or Consulting provider [place] or covering provider during after hours [place], for this patient?  1. Check the care team in [facility] and look for a) attending/consulting [facility] provider listed and b) the [facility] team listed 2. Log into [email or web address] and use [place]'s universal password to access. If you do not have the password, please contact the hospital operator. 3. Locate the [facility] provider you are looking for under [facility] and page to a number that you can be directly reached. 4. If you still have difficulty reaching the provider, please page the [facility] (Director on Call) for the Hospitalists listed on amion for assistance.

## 2019-05-08 NOTE — Transfer of Care (Signed)
Immediate Anesthesia Transfer of Care Note  Patient: Rhonda Bailey  Procedure(s) Performed: CYSTOSCOPY WITH RETROGRADE PYELOGRAM/URETERAL STENT PLACEMENT (Right )  Patient Location: PACU  Anesthesia Type:MAC  Level of Consciousness: awake and sedated  Airway & Oxygen Therapy: Patient connected to nasal cannula oxygen  Post-op Assessment: Report given to RN and Post -op Vital signs reviewed and stable  Post vital signs: Reviewed and stable  Last Vitals:  Vitals Value Taken Time  BP 133/49 05/08/19 1717  Temp    Pulse 121 05/08/19 1718  Resp 34 05/08/19 1718  SpO2 98 % 05/08/19 1718  Vitals shown include unvalidated device data.  Last Pain:  Vitals:   05/08/19 1531  TempSrc:   PainSc: 8          Complications: No apparent anesthesia complications

## 2019-05-08 NOTE — ED Provider Notes (Signed)
[facility] Provider Note   CSN: [id number] Arrival date & time: 05/08/19  7517     History   Chief Complaint Chief Complaint  Patient presents with  . Flank Pain    HPI Rhonda Bailey is a 73 y.o. female.  She is complaining of right flank pain that started about 4 days ago.  It radiated around to her groin.  It was associated with hematuria and urinary frequency.  She went to urgent care and they told her it is probably a kidney stone and gave her tramadol.  She is as the medications make her feel really hot and she does not like them.  She is been nauseous and had some vomiting.  No fevers or chills no cough no chest pain no diarrhea.  The hematuria and the frequency have improved.     The history is provided by the patient.  Flank Pain This is a recurrent problem. The current episode started more than 2 days ago. The problem occurs hourly. The problem has not changed since onset.Associated symptoms include abdominal pain. Pertinent negatives include no chest pain, no headaches and no shortness of breath. Nothing aggravates the symptoms. Nothing relieves the symptoms. She has tried nothing for the symptoms. The treatment provided no relief.    Past Medical History:  Diagnosis Date  . Anxiety   . Hypothyroidism     Patient Active Problem List   Diagnosis Date Noted  . Dysphagia 01/21/2019  . Microcytic anemia 01/21/2019  . Anemia 08/29/2018  . Insomnia 08/29/2018  . Abnormal CT of the abdomen 08/29/2018  . Diarrhea of infectious origin   . Colitis 08/28/2018  . Adrenal nodule ([place]) 08/28/2018  . Hypokalemia 08/28/2018  . Dehydration with hyponatremia 08/28/2018  . Leukocytosis 08/28/2018    Past Surgical History:  Procedure Laterality Date  . BIOPSY  09/02/2018   Procedure: BIOPSY;  Surgeon: Daneil Dolin, MD;  Location: [facility];  Service: Endoscopy;;  colon   . CHOLECYSTECTOMY  05/19/2011   Procedure: LAPAROSCOPIC CHOLECYSTECTOMY;   Surgeon: Jamesetta So;  Location: [facility];  Service: General;  Laterality: N/A;  . COLONOSCOPY WITH PROPOFOL N/A 09/02/2018   Dr. Gala Romney: Proctocolitis limited to the left side with some exudate versus partial pseudomembrane formation, erosions and ulceration somewhat superficial.  Terminal ileum normal.  Diverticulosis.  Descending/sigmoid colon biopsy showing severe active colitis, diagnostic features of IBD not identified.  Differential includes infection, ischemia.  Rectal biopsies with granulation tissue c/w ulcer  . TUBAL LIGATION       OB History   No obstetric history on file.      Home Medications    Prior to Admission medications   Medication Sig Start Date End Date Taking? Authorizing Provider  cholecalciferol (VITAMIN D3) 25 MCG (1000 UT) tablet Take 1,000 Units by mouth daily.    [provider]  levothyroxine (SYNTHROID, LEVOTHROID) 25 MCG tablet Take 25 mcg by mouth daily.      [provider]  Multiple Vitamins-Minerals (MULTIVITAMIN ADULT PO) Take 1 tablet by mouth daily.    [provider]  pantoprazole (PROTONIX) 40 MG tablet Take 40 mg by mouth daily.  08/17/18   [provider]  Thiamine HCl (VITAMIN B-1 PO) Take 1 tablet by mouth daily.    [provider]  traZODone (DESYREL) 50 MG tablet Take 1 tablet by mouth at bedtime. 08/25/18   [provider]    Family History Family History  Problem Relation Age  of Onset  . Anesthesia problems Neg Hx   . Hypotension Neg Hx   . Malignant hyperthermia Neg Hx   . Pseudochol deficiency Neg Hx     Social History Social History   Tobacco Use  . Smoking status: Never Smoker  . Smokeless tobacco: Never Used  Substance Use Topics  . Alcohol use: No  . Drug use: No     Allergies   Tramadol   Review of Systems Review of Systems  Constitutional: Negative for fever.  HENT: Negative for sore throat.   Eyes: Negative for visual disturbance.  Respiratory: Negative  for shortness of breath.   Cardiovascular: Negative for chest pain.  Gastrointestinal: Positive for abdominal pain, nausea and vomiting. Negative for constipation and diarrhea.  Genitourinary: Positive for flank pain, frequency and hematuria. Negative for dysuria.  Musculoskeletal: Positive for back pain.  Skin: Negative for rash.  Neurological: Negative for headaches.     Physical Exam Updated Vital Signs BP 123/67 (BP Location: Right Arm)   Pulse (!) 107   Temp 98 F (36.7 C) (Oral)   Resp 14   Ht 5' 8"  (1.727 m)   Wt 79.4 kg   SpO2 97%   BMI 26.61 kg/m   Physical Exam Vitals signs and nursing note reviewed.  Constitutional:      General: She is not in acute distress.    Appearance: She is well-developed.  HENT:     Head: Normocephalic and atraumatic.  Eyes:     Conjunctiva/sclera: Conjunctivae normal.  Neck:     Musculoskeletal: Neck supple.  Cardiovascular:     Rate and Rhythm: Normal rate and regular rhythm.     Heart sounds: No murmur.  Pulmonary:     Effort: Pulmonary effort is normal. No respiratory distress.     Breath sounds: Normal breath sounds.  Abdominal:     Palpations: Abdomen is soft.     Tenderness: There is no abdominal tenderness. There is no guarding or rebound.  Musculoskeletal: Normal range of motion.     Right lower leg: No edema.     Left lower leg: No edema.  Skin:    General: Skin is warm and dry.     Capillary Refill: Capillary refill takes less than 2 seconds.  Neurological:     General: No focal deficit present.     Mental Status: She is alert.     Sensory: No sensory deficit.     Motor: No weakness.      ED Treatments / Results  Labs (all labs ordered are listed, but only abnormal results are displayed) Labs Reviewed  CBC WITH DIFFERENTIAL/PLATELET - Abnormal; Notable for the following components:      Result Value   WBC 24.2 (*)    RBC 5.16 (*)    Hemoglobin 11.7 (*)    MCV 75.2 (*)    MCH 22.7 (*)    RDW 17.4 (*)     Platelets 412 (*)    Neutro Abs 21.2 (*)    Monocytes Absolute 1.5 (*)    Abs Immature Granulocytes 0.15 (*)    All other components within normal limits  COMPREHENSIVE METABOLIC PANEL - Abnormal; Notable for the following components:   Sodium 133 (*)    Chloride 97 (*)    Glucose, Bld 200 (*)    Creatinine, Ser 1.54 (*)    Calcium 8.4 (*)    Albumin 3.1 (*)    GFR calc non Af Amer 33 (*)    GFR  calc Af Amer 38 (*)    All other components within normal limits  URINALYSIS, ROUTINE W REFLEX MICROSCOPIC - Abnormal; Notable for the following components:   APPearance HAZY (*)    Hgb urine dipstick MODERATE (*)    Protein, ur 100 (*)    Leukocytes,Ua LARGE (*)    WBC, UA >50 (*)    Bacteria, UA RARE (*)    Non Squamous Epithelial 0-5 (*)    All other components within normal limits  LACTIC ACID, PLASMA - Abnormal; Notable for the following components:   Lactic Acid, Venous 6.4 (*)    All other components within normal limits  CULTURE, BLOOD (ROUTINE X 2)  CULTURE, BLOOD (ROUTINE X 2)  SARS CORONAVIRUS 2 (HOSPITAL ORDER, [facility])  MRSA PCR SCREENING  URINE CULTURE  LACTIC ACID, PLASMA  HEMOGLOBIN A1C    EKG None  Radiology Ct Renal Stone Study  Result Date: 05/08/2019 CLINICAL DATA:  Flank pain and hematuria EXAM: CT ABDOMEN AND PELVIS WITHOUT CONTRAST TECHNIQUE: Multidetector CT imaging of the abdomen and pelvis was performed following the standard protocol without oral or IV contrast. COMPARISON:  August 29, 2018 FINDINGS: Lower chest: There is no lung base edema or consolidation. There is a small hiatal hernia. Hepatobiliary: There are scattered tiny calcifications in the liver, likely indicative of prior granulomatous disease. Beyond the small calcifications, no focal liver lesions are evident. Gallbladder is absent. There is no appreciable biliary duct dilatation. Pancreas: There is fatty infiltration in portions of pancreas. No pancreatic  mass or inflammatory focus evident. Spleen: No splenic lesions appreciable. Adrenals/Urinary Tract: Right adrenal appears normal. There is a stable left adrenal mass measuring 2.2 x 2.0 cm, indeterminate by CT criteria. Right kidney is mildly edematous. There is no evident pelvic mass on either side. There is mild hydronephrosis on the right. There is no appreciable hydronephrosis on the left. There is no intrarenal calculus on either side. There is a calculus in the right ureter at the mid sacral level measuring 6 x 4 mm. No other ureteral calculi are evident. The wall of the urinary bladder is thickened diffusely. There is mild mesenteric stranding adjacent to the urinary bladder. Stomach/Bowel: There are multiple sigmoid diverticula. There is slight thickening of the adjacent mesentery at the levels of the distal descending colon and proximal sigmoid colon. Suspect mild and potentially chronic diverticulitis. No associated fluid. No abscess or perforation. There is no appreciable bowel wall or mesenteric thickening. No evident bowel obstruction. Terminal ileum appears normal. No evident free air or portal venous air. Vascular/Lymphatic: No abdominal aortic aneurysm. There are foci of aortic atherosclerosis. There is no appreciable adenopathy in the abdomen or pelvis. Reproductive: Uterus is midline.  No pelvic masses are evident. Other: The appendix appears normal. There is no appreciable abscess or ascites in the abdomen or pelvis. Slight fat is noted in the umbilicus. Musculoskeletal: There is spinal stenosis at L4-5 due to diffuse disc protrusion and bony hypertrophy. There is degenerative change at multiple sites in the lower thoracic and lumbar regions. No blastic or lytic bone lesions evident. No intramuscular lesions are appreciable. IMPRESSION: 1. 6 x 4 mm calculus in the right ureter at the mid sacral level causing mild hydronephrosis on the right. Right kidney is subtly edematous. 2. Urinary bladder  wall is diffusely thickened. There is soft tissue stranding adjacent to the urinary bladder, likely of inflammatory etiology. 3. Extensive sigmoid and distal descending colonic diverticulosis. Mild mesenteric thickening at  the level of the distal descending colon and proximal sigmoid colon may represent mild and potentially chronic diverticulitis. Somewhat similar changes noted on prior CT. No fluid in this area of suspected mild inflammation of the distal descending and proximal sigmoid colon. No perforation or abscess. 4. Spinal stenosis at L4-5 due to diffuse disc protrusion and bony hypertrophy. 5. Stable left adrenal mass. This mass has been followed with stability for less than 1 year. A lesion of this nature should be followed without change for a minimum of 1 year. In this regard, a follow-up CT in 1 year to confirm stability of the left adrenal mass advised. Note that based on attenuation criteria, it is an indeterminate category. 6. No bowel obstruction. No abscess in the abdomen or pelvis. Appendix appears normal. 7.  Small hiatal hernia. Electronically Signed   By: Lowella Grip III M.D.   On: 05/08/2019 11:12    Procedures .Critical Care Performed by: Hayden Rasmussen, MD Authorized by: Hayden Rasmussen, MD   Critical care provider statement:    Critical care time (minutes):  45   Critical care time was exclusive of:  Separately billable procedures and treating other patients   Critical care was necessary to treat or prevent imminent or life-threatening deterioration of the following conditions:  Sepsis   Critical care was time spent personally by me on the following activities:  Discussions with consultants, evaluation of patient's response to treatment, examination of patient, ordering and performing treatments and interventions, ordering and review of laboratory studies, ordering and review of radiographic studies, pulse oximetry, re-evaluation of patient's condition, obtaining  history from patient or surrogate, review of old charts and development of treatment plan with patient or surrogate   I assumed direction of critical care for this patient from another provider in my specialty: no     (including critical care time)  Medications Ordered in ED Medications  enoxaparin (LOVENOX) injection 40 mg (has no administration in time range)  0.9 %  sodium chloride infusion (has no administration in time range)  acetaminophen (TYLENOL) tablet 650 mg (has no administration in time range)    Or  acetaminophen (TYLENOL) suppository 650 mg (has no administration in time range)  bisacodyl (DULCOLAX) EC tablet 5 mg (has no administration in time range)  ondansetron (ZOFRAN) tablet 4 mg (has no administration in time range)    Or  ondansetron (ZOFRAN) injection 4 mg (has no administration in time range)  lactated ringers infusion ( Intravenous New Bag/Given 05/08/19 1541)  sodium chloride 0.9 % bolus 1,000 mL (0 mLs Intravenous Stopped 05/08/19 1202)  ondansetron (ZOFRAN) injection 4 mg (4 mg Intravenous Given 05/08/19 1012)  cefTRIAXone (ROCEPHIN) 1 g in sodium chloride 0.9 % 100 mL IVPB (0 g Intravenous Stopped 05/08/19 1202)  promethazine (PHENERGAN) injection 12.5 mg (12.5 mg Intravenous Given 05/08/19 1311)  cefTRIAXone (ROCEPHIN) 1 g in sodium chloride 0.9 % 100 mL IVPB (1 g Intravenous New Bag/Given 05/08/19 1419)  sodium chloride 0.9 % bolus 1,000 mL (1,000 mLs Intravenous New Bag/Given 05/08/19 1422)  diatrizoate meglumine (CYSTOGRAFIN/HYPAQUE) 30 % solution (has no administration in time range)  lidocaine 20 MG/ML injection (has no administration in time range)  ketamine HCl 50 MG/5ML SOSY (has no administration in time range)  propofol (DIPRIVAN) 10 mg/mL bolus/IV push (has no administration in time range)     Initial Impression / Assessment and Plan / ED Course  I have reviewed the triage vital signs and the nursing notes.  Pertinent labs &  imaging results that were  available during my care of the patient were reviewed by me and considered in my medical decision making (see chart for details).  Clinical Course as of May 07 1640  Thu Oct 01, [id number]  1768 73 year old with history of kidney stones here with right flank pain for the last 4 days and also feeling hot possibly related to the pain medication given by urgent care.  Differential includes renal colic, pyelonephritis, musculoskeletal, intra-abdominal process.  Getting labs and CT KUB.   [MB]  60 Patient's lab work shows an elevated white count of 24.  She is also got a mild AKI with a creatinine of 1.5.  CT showing a right-sided stone with mild hydro-.  They also comment upon some bladder thickening.  Also on the CT they see a lot of colonic thickening with he said it seems similar to prior and no discrete diverticular abscess.  Other incidental findings.  I do not have a urine yet but we will give her a dose of ceftriaxone IV.   [MB]  1252 Discussed with Dr. Junious Silk from urology.  He said he was can order the scan and then get back to me.  I go find the patient now and she is nauseous and having some rigors.  Likely will need to be admitted.  Ordering lactate and blood cultures.   [MB]  8921 Discussed with Dr. Junious Silk.  He reviewed her films and does feel like she is probably got a need a stent.  He asked if we would admit her to medicine and he should be able to do a procedure later on today.  Updated on him that she is having some Reiger's now and increased heart rate and likely becoming more septic.   [MB]  1406 Patient lactate back critical at 6.4.  IV fluids infusing.  Discussed with Dr. Wynetta Emery hospitalist service for admission.   [MB]    Clinical Course User Index [MB] Hayden Rasmussen, MD        Final Clinical Impressions(s) / ED Diagnoses   Final diagnoses:  Sepsis with acute organ dysfunction, due to unspecified organism, unspecified type, unspecified whether septic shock present  [facility])  Ureteral stone with hydronephrosis  Pyelonephritis    ED Discharge Orders    None       Hayden Rasmussen, MD 05/08/19 1642

## 2019-05-08 NOTE — ED Notes (Signed)
Date and time results received: 05/08/19 1405  Test: lactic acid Critical Value: 6.4  Name of Provider Notified: Melina Copa  Orders Received? Or Actions Taken?:see orders

## 2019-05-08 NOTE — Consult Note (Addendum)
H&P  Chief Complaint: right ureteral stone, pyelonephritis  History of Present Illness: 73 yo female presented today with right flank pain. CT showed a 6 mm right mid ureter stone and edematous right kidney.  She had wbc 25 and rigors. She's had N/V. With bacteria on UA and elevated Cr to 1.54 she was brought for right ureteral stenting. LA also elevated to 6.4. LA improved with fluids.   She continues to have right flank pain in pre-op and hasnt seen a stone pass today. No prior h/o stones.   Past Medical History:  Diagnosis Date  . Anxiety   . Hypothyroidism    Past Surgical History:  Procedure Laterality Date  . BIOPSY  09/02/2018   Procedure: BIOPSY;  Surgeon: Daneil Dolin, MD;  Location: [facility];  Service: Endoscopy;;  colon   . CHOLECYSTECTOMY  05/19/2011   Procedure: LAPAROSCOPIC CHOLECYSTECTOMY;  Surgeon: Jamesetta So;  Location: [facility];  Service: General;  Laterality: N/A;  . COLONOSCOPY WITH PROPOFOL N/A 09/02/2018   Dr. Gala Romney: Proctocolitis limited to the left side with some exudate versus partial pseudomembrane formation, erosions and ulceration somewhat superficial.  Terminal ileum normal.  Diverticulosis.  Descending/sigmoid colon biopsy showing severe active colitis, diagnostic features of IBD not identified.  Differential includes infection, ischemia.  Rectal biopsies with granulation tissue c/w ulcer  . TUBAL LIGATION      Home Medications:  Medications Prior to Admission  Medication Sig Dispense Refill Last Dose  . cholecalciferol (VITAMIN D3) 25 MCG (1000 UT) tablet Take 1,000 Units by mouth daily.   05/07/2019 at Unknown time  . levothyroxine (SYNTHROID, LEVOTHROID) 25 MCG tablet Take 37.5 mcg by mouth daily.    05/08/2019 at Unknown time  . Multiple Vitamins-Minerals (MULTIVITAMIN ADULT PO) Take 1 tablet by mouth daily.   05/07/2019 at Unknown time  . pantoprazole (PROTONIX) 40 MG tablet Take 40 mg by mouth daily.    05/08/2019 at Unknown time  . Thiamine HCl  (VITAMIN B-1 PO) Take 1 tablet by mouth daily.   05/07/2019 at Unknown time  . traZODone (DESYREL) 50 MG tablet Take 1 tablet by mouth at bedtime.   05/07/2019 at Unknown time  . traMADol (ULTRAM) 50 MG tablet Take 1-2 tablets by mouth every 6 (six) hours as needed.   Unknown at Unknown time   Allergies:  Allergies  Allergen Reactions  . Tramadol     Says "blew my head up."    Family History  Problem Relation Age of Onset  . Anesthesia problems Neg Hx   . Hypotension Neg Hx   . Malignant hyperthermia Neg Hx   . Pseudochol deficiency Neg Hx    Social History:  reports that she has never smoked. She has never used smokeless tobacco. She reports that she does not drink alcohol or use drugs.  ROS: A complete review of systems was performed.  All systems are negative except for pertinent findings as noted. Review of Systems  Constitutional: Positive for chills.  Gastrointestinal: Positive for nausea.  All other systems reviewed and are negative.    Physical Exam:  Vital signs in last 24 hours: Temp:  [98 F (36.7 C)-100.8 F (38.2 C)] 100.8 F (38.2 C) (10/01 1528) Pulse Rate:  [107-125] 124 (10/01 1528) Resp:  [14-24] 24 (10/01 1528) BP: (123-150)/(61-67) 136/64 (10/01 1531) SpO2:  [96 %-97 %] 96 % (10/01 1528) Weight:  [79.4 kg] 79.4 kg (10/01 0945) General:  Alert and oriented, No acute distress HEENT: Normocephalic, atraumatic Cardiovascular:  Regular rate and rhythm Lungs: Regular rate and effort Abdomen: Soft, nontender, nondistended, no abdominal masses Back: No CVA tenderness Extremities: No edema Neurologic: Grossly intact  Laboratory Data:  Results for orders placed or performed during the hospital encounter of 05/08/19 (from the past 24 hour(s))  Urinalysis, Routine w reflex microscopic     Status: Abnormal   Collection Time: 05/08/19  9:54 AM  Result Value Ref Range   Color, Urine YELLOW YELLOW   APPearance HAZY (A) CLEAR   Specific Gravity, Urine 1.008  1.005 - 1.030   pH 6.0 5.0 - 8.0   Glucose, UA NEGATIVE NEGATIVE mg/dL   Hgb urine dipstick MODERATE (A) NEGATIVE   Bilirubin Urine NEGATIVE NEGATIVE   Ketones, ur NEGATIVE NEGATIVE mg/dL   Protein, ur 100 (A) NEGATIVE mg/dL   Nitrite NEGATIVE NEGATIVE   Leukocytes,Ua LARGE (A) NEGATIVE   RBC / HPF 11-20 0 - 5 RBC/hpf   WBC, UA >50 (H) 0 - 5 WBC/hpf   Bacteria, UA RARE (A) NONE SEEN   Squamous Epithelial / LPF 0-5 0 - 5   WBC Clumps PRESENT    Mucus PRESENT    Hyaline Casts, UA PRESENT    Non Squamous Epithelial 0-5 (A) NONE SEEN  CBC with Differential     Status: Abnormal   Collection Time: 05/08/19 10:13 AM  Result Value Ref Range   WBC 24.2 (H) 4.0 - 10.5 K/uL   RBC 5.16 (H) 3.87 - 5.11 MIL/uL   Hemoglobin 11.7 (L) 12.0 - 15.0 g/dL   HCT 38.8 36.0 - 46.0 %   MCV 75.2 (L) 80.0 - 100.0 fL   MCH 22.7 (L) 26.0 - 34.0 pg   MCHC 30.2 30.0 - 36.0 g/dL   RDW 17.4 (H) 11.5 - 15.5 %   Platelets 412 (H) 150 - 400 K/uL   nRBC 0.0 0.0 - 0.2 %   Neutrophils Relative % 88 %   Neutro Abs 21.2 (H) 1.7 - 7.7 K/uL   Lymphocytes Relative 5 %   Lymphs Abs 1.2 0.7 - 4.0 K/uL   Monocytes Relative 6 %   Monocytes Absolute 1.5 (H) 0.1 - 1.0 K/uL   Eosinophils Relative 0 %   Eosinophils Absolute 0.1 0.0 - 0.5 K/uL   Basophils Relative 0 %   Basophils Absolute 0.1 0.0 - 0.1 K/uL   Immature Granulocytes 1 %   Abs Immature Granulocytes 0.15 (H) 0.00 - 0.07 K/uL  Comprehensive metabolic panel     Status: Abnormal   Collection Time: 05/08/19 10:13 AM  Result Value Ref Range   Sodium 133 (L) 135 - 145 mmol/L   Potassium 3.5 3.5 - 5.1 mmol/L   Chloride 97 (L) 98 - 111 mmol/L   CO2 23 22 - 32 mmol/L   Glucose, Bld 200 (H) 70 - 99 mg/dL   BUN 18 8 - 23 mg/dL   Creatinine, Ser 1.54 (H) 0.44 - 1.00 mg/dL   Calcium 8.4 (L) 8.9 - 10.3 mg/dL   Total Protein 6.7 6.5 - 8.1 g/dL   Albumin 3.1 (L) 3.5 - 5.0 g/dL   AST 27 15 - 41 U/L   ALT 19 0 - 44 U/L   Alkaline Phosphatase 81 38 - 126 U/L   Total  Bilirubin 0.8 0.3 - 1.2 mg/dL   GFR calc non Af Amer 33 (L) >60 mL/min   GFR calc Af Amer 38 (L) >60 mL/min   Anion gap 13 5 - 15  Culture, blood (routine x 2)  Status: None (Preliminary result)   Collection Time: 05/08/19  1:07 PM   Specimen: BLOOD LEFT ARM  Result Value Ref Range   Specimen Description BLOOD LEFT ARM    Special Requests      BOTTLES DRAWN AEROBIC AND ANAEROBIC Blood Culture adequate volume Performed at [facility], [address] [place], [place] [address]    Culture PENDING    Report Status PENDING   Culture, blood (routine x 2)     Status: None (Preliminary result)   Collection Time: 05/08/19  1:07 PM   Specimen: BLOOD LEFT ARM  Result Value Ref Range   Specimen Description BLOOD LEFT ARM    Special Requests      BOTTLES DRAWN AEROBIC ONLY Blood Culture adequate volume Performed at [facility], [address] [place], [place] [address]    Culture PENDING    Report Status PENDING   Lactic acid, plasma     Status: Abnormal   Collection Time: 05/08/19  1:07 PM  Result Value Ref Range   Lactic Acid, Venous 6.4 (HH) 0.5 - 1.9 mmol/L  SARS Coronavirus 2 [facility] order, Performed in [place] hospital lab) Nasopharyngeal Nasopharyngeal Swab     Status: None   Collection Time: 05/08/19  1:11 PM   Specimen: Nasopharyngeal Swab  Result Value Ref Range   SARS Coronavirus 2 NEGATIVE NEGATIVE  MRSA PCR Screening     Status: None   Collection Time: 05/08/19  2:30 PM   Specimen: Nasal Mucosa; Nasopharyngeal  Result Value Ref Range   MRSA by PCR NEGATIVE NEGATIVE  Lactic acid, plasma     Status: None   Collection Time: 05/08/19  3:01 PM  Result Value Ref Range   Lactic Acid, Venous 1.6 0.5 - 1.9 mmol/L   Recent Results (from the past 240 hour(s))  Culture, blood (routine x 2)     Status: None (Preliminary result)   Collection Time: 05/08/19  1:07 PM   Specimen: BLOOD LEFT ARM  Result Value Ref Range Status   Specimen Description BLOOD LEFT ARM  Final    Special Requests   Final    BOTTLES DRAWN AEROBIC AND ANAEROBIC Blood Culture adequate volume Performed at [facility], [address], [place], [place] [address]    Culture PENDING  Incomplete   Report Status PENDING  Incomplete  Culture, blood (routine x 2)     Status: None (Preliminary result)   Collection Time: 05/08/19  1:07 PM   Specimen: BLOOD LEFT ARM  Result Value Ref Range Status   Specimen Description BLOOD LEFT ARM  Final   Special Requests   Final    BOTTLES DRAWN AEROBIC ONLY Blood Culture adequate volume Performed at [facility], [address], [place], [place] [address]    Culture PENDING  Incomplete   Report Status PENDING  Incomplete  SARS Coronavirus 2 [facility] order, Performed in [place] hospital lab) Nasopharyngeal Nasopharyngeal Swab     Status: None   Collection Time: 05/08/19  1:11 PM   Specimen: Nasopharyngeal Swab  Result Value Ref Range Status   SARS Coronavirus 2 NEGATIVE NEGATIVE Final    Comment: (NOTE) If result is NEGATIVE SARS-CoV-2 target nucleic acids are NOT DETECTED. The SARS-CoV-2 RNA is generally detectable in upper and lower  respiratory specimens during the acute phase of infection. The lowest  concentration of SARS-CoV-2 viral copies this assay can detect is 250  copies / mL. A negative result does not preclude SARS-CoV-2 infection  and should not be used  as the sole basis for treatment or other  patient management decisions.  A negative result may occur with  improper specimen collection / handling, submission of specimen other  than nasopharyngeal swab, presence of viral mutation(s) within the  areas targeted by this assay, and inadequate number of viral copies  (<250 copies / mL). A negative result must be combined with clinical  observations, patient history, and epidemiological information. If result is POSITIVE SARS-CoV-2 target nucleic acids are DETECTED. The SARS-CoV-2 RNA is generally detectable in upper and lower   respiratory specimens dur ing the acute phase of infection.  Positive  results are indicative of active infection with SARS-CoV-2.  Clinical  correlation with patient history and other diagnostic information is  necessary to determine patient infection status.  Positive results do  not rule out bacterial infection or co-infection with other viruses. If result is PRESUMPTIVE POSTIVE SARS-CoV-2 nucleic acids MAY BE PRESENT.   A presumptive positive result was obtained on the submitted specimen  and confirmed on repeat testing.  While 2019 novel coronavirus  (SARS-CoV-2) nucleic acids may be present in the submitted sample  additional confirmatory testing may be necessary for epidemiological  and / or clinical management purposes  to differentiate between  SARS-CoV-2 and other Sarbecovirus currently known to infect humans.  If clinically indicated additional testing with an alternate test  methodology [phone]) is advised. The SARS-CoV-2 RNA is generally  detectable in upper and lower respiratory sp ecimens during the acute  phase of infection. The expected result is Negative. Fact Sheet for Patients:  StrictlyIdeas.no Fact Sheet for Healthcare Providers: BankingDealers.co.za This test is not yet approved or cleared by the [place] FDA and has been authorized for detection and/or diagnosis of SARS-CoV-2 by FDA under an Emergency Use Authorization (EUA).  This EUA will remain in effect (meaning this test can be used) for the duration of the COVID-19 declaration under Section 564(b)(1) of the Act, 21 U.S.C. section 360bbb-3(b)(1), unless the authorization is terminated or revoked sooner. Performed at [facility], [address], [place], [place] [address]   MRSA PCR Screening     Status: None   Collection Time: 05/08/19  2:30 PM   Specimen: Nasal Mucosa; Nasopharyngeal  Result Value Ref Range Status   MRSA by PCR NEGATIVE NEGATIVE  Final    Comment:        The GeneXpert MRSA Assay (FDA approved for NASAL specimens only), is one component of a comprehensive MRSA colonization surveillance program. It is not intended to diagnose MRSA infection nor to guide or monitor treatment for MRSA infections. Performed at [facility], [address] [place], [place] [address]    Creatinine: Recent Labs    05/08/19 1013  CREATININE 1.54*    Impression/Assessment/plan: 73 yo with 6 mm right mid stone, wbc 25, rare bacteria in urine, N/V, elevated Cr and glucose -- I discussed with the patient the nature, potential benefits, risks and alternatives to cystoscopy, right RGP and right stent, including side effects of the proposed treatment, the likelihood of the patient achieving the goals of the procedure, and any potential problems that might occur during the procedure or recuperation. We discussed rationale for a staged procedure and importance of f/u in 1-2 weeks to plan stent/stone removal with one of my colleagues at [facility]. All questions answered. Patient elects to proceed.   Festus Aloe 05/08/2019, 4:43 PM

## 2019-05-09 ENCOUNTER — Ambulatory Visit: Payer: Medicare HMO | Admitting: Urology

## 2019-05-09 LAB — COMPREHENSIVE METABOLIC PANEL
ALT: 15 U/L (ref 0–44)
AST: 18 U/L (ref 15–41)
Albumin: 2.2 g/dL — ABNORMAL LOW (ref 3.5–5.0)
Alkaline Phosphatase: 68 U/L (ref 38–126)
Anion gap: 6 (ref 5–15)
BUN: 16 mg/dL (ref 8–23)
CO2: 22 mmol/L (ref 22–32)
Calcium: 6.9 mg/dL — ABNORMAL LOW (ref 8.9–10.3)
Chloride: 110 mmol/L (ref 98–111)
Creatinine, Ser: 1.3 mg/dL — ABNORMAL HIGH (ref 0.44–1.00)
GFR calc Af Amer: 47 mL/min — ABNORMAL LOW (ref >60)
GFR calc non Af Amer: 41 mL/min — ABNORMAL LOW (ref >60)
Glucose, Bld: 126 mg/dL — ABNORMAL HIGH (ref 70–99)
Potassium: 3.1 mmol/L — ABNORMAL LOW (ref 3.5–5.1)
Sodium: 138 mmol/L (ref 135–145)
Total Bilirubin: 0.3 mg/dL (ref 0.3–1.2)
Total Protein: 5.1 g/dL — ABNORMAL LOW (ref 6.5–8.1)

## 2019-05-09 LAB — CBC WITH DIFFERENTIAL/PLATELET
Abs Immature Granulocytes: 0.09 10*3/uL — ABNORMAL HIGH (ref 0.00–0.07)
Basophils Absolute: 0 10*3/uL (ref 0.0–0.1)
Basophils Relative: 0 %
Eosinophils Absolute: 0 10*3/uL (ref 0.0–0.5)
Eosinophils Relative: 0 %
HCT: 32.1 % — ABNORMAL LOW (ref 36.0–46.0)
Hemoglobin: 9.5 g/dL — ABNORMAL LOW (ref 12.0–15.0)
Immature Granulocytes: 1 %
Lymphocytes Relative: 7 %
Lymphs Abs: 1.2 10*3/uL (ref 0.7–4.0)
MCH: 23.1 pg — ABNORMAL LOW (ref 26.0–34.0)
MCHC: 29.6 g/dL — ABNORMAL LOW (ref 30.0–36.0)
MCV: 78.1 fL — ABNORMAL LOW (ref 80.0–100.0)
Monocytes Absolute: 1 10*3/uL (ref 0.1–1.0)
Monocytes Relative: 7 %
Neutro Abs: 13.5 10*3/uL — ABNORMAL HIGH (ref 1.7–7.7)
Neutrophils Relative %: 85 %
Platelets: 276 10*3/uL (ref 150–400)
RBC: 4.11 MIL/uL (ref 3.87–5.11)
RDW: 17.7 % — ABNORMAL HIGH (ref 11.5–15.5)
WBC: 15.8 10*3/uL — ABNORMAL HIGH (ref 4.0–10.5)
nRBC: 0 % (ref 0.0–0.2)

## 2019-05-09 LAB — URINE CULTURE: Culture: <10000 — AB

## 2019-05-09 LAB — MAGNESIUM: Magnesium: 1.9 mg/dL (ref 1.7–2.4)

## 2019-05-09 LAB — LACTIC ACID, PLASMA: Lactic Acid, Venous: 1.2 mmol/L (ref 0.5–1.9)

## 2019-05-09 MED ORDER — SODIUM CHLORIDE 0.9 % IV BOLUS
1000.0000 mL | Freq: Once | INTRAVENOUS | Status: AC
  Administered 2019-05-09: 1000 mL via INTRAVENOUS

## 2019-05-09 MED ORDER — INFLUENZA VAC A&B SA ADJ QUAD 0.5 ML IM PRSY: 0.5000 mL | PREFILLED_SYRINGE | INTRAMUSCULAR | Status: DC

## 2019-05-09 MED ORDER — POTASSIUM CHLORIDE CRYS ER 20 MEQ PO TBCR
40.0000 meq | EXTENDED_RELEASE_TABLET | Freq: Once | ORAL | Status: AC
  Administered 2019-05-09: 40 meq via ORAL
  Filled 2019-05-09: qty 2

## 2019-05-09 MED ORDER — LOPERAMIDE HCL 2 MG PO CAPS
4.0000 mg | ORAL_CAPSULE | Freq: Once | ORAL | Status: AC
  Administered 2019-05-09: 4 mg via ORAL
  Filled 2019-05-09: qty 2

## 2019-05-09 MED ORDER — PROMETHAZINE HCL 25 MG/ML IJ SOLN
12.5000 mg | Freq: Four times a day (QID) | INTRAMUSCULAR | Status: DC | PRN
  Administered 2019-05-11: 12.5 mg via INTRAVENOUS
  Filled 2019-05-09: qty 1

## 2019-05-09 MED ORDER — SODIUM CHLORIDE 0.9 % IV BOLUS
250.0000 mL | Freq: Once | INTRAVENOUS | Status: AC
  Administered 2019-05-09: 250 mL via INTRAVENOUS

## 2019-05-09 MED ORDER — CALCIUM GLUCONATE-NACL 1-0.675 GM/50ML-% IV SOLN
1.0000 g | Freq: Once | INTRAVENOUS | Status: AC
  Administered 2019-05-09: 1000 mg via INTRAVENOUS
  Filled 2019-05-09: qty 50

## 2019-05-09 NOTE — Care Management Important Message (Signed)
Important Message  Patient Details  Name: Rhonda Bailey MRN: [id number] Date of Birth: Dec 26, 1945   Medicare Important Message Given:  Yes     Tommy Medal 05/09/2019, 12:54 PM

## 2019-05-09 NOTE — Progress Notes (Signed)
Subjective: She was admitted with severe sepsis.  She had what appeared to be a UTI from kidney stone.  She had stent placement yesterday and she says she feels much better.  She says she feels tired.  She had lactate level that was approximately 6 and is now back down to normal.  Her blood pressure is better.  Objective: Vital signs in last 24 hours: Temp:  [97.9 F (36.6 C)-100.8 F (38.2 C)] 98.3 F (36.8 C) (10/02 0755) Pulse Rate:  [80-125] 98 (10/02 0730) Resp:  [14-33] 23 (10/02 0730) BP: (81-150)/(36-76) 132/57 (10/02 0730) SpO2:  [90 %-100 %] 97 % (10/02 0730) Weight:  [79.4 kg-88.6 kg] 88.6 kg (10/02 0600) Weight change:  Last BM Date: 05/08/19  Intake/Output from previous day: 10/01 0701 - 10/02 0700 In: 2617.8 [P.O.:100; I.V.:1417.8; IV Piggyback:1100] Out: 1103 [Urine:1100; Blood:3]  PHYSICAL EXAM General appearance: alert, cooperative and no distress Resp: clear to auscultation bilaterally Cardio: regular rate and rhythm, S1, S2 normal, no murmur, click, rub or gallop GI: soft, non-tender; bowel sounds normal; no masses,  no organomegaly Extremities: extremities normal, atraumatic, no cyanosis or edema  Lab Results:  Results for orders placed or performed during the hospital encounter of 05/08/19 (from the past 48 hour(s))  Urinalysis, Routine w reflex microscopic     Status: Abnormal   Collection Time: 05/08/19  9:54 AM  Result Value Ref Range   Color, Urine YELLOW YELLOW   APPearance HAZY (A) CLEAR   Specific Gravity, Urine 1.008 1.005 - 1.030   pH 6.0 5.0 - 8.0   Glucose, UA NEGATIVE NEGATIVE mg/dL   Hgb urine dipstick MODERATE (A) NEGATIVE   Bilirubin Urine NEGATIVE NEGATIVE   Ketones, ur NEGATIVE NEGATIVE mg/dL   Protein, ur 100 (A) NEGATIVE mg/dL   Nitrite NEGATIVE NEGATIVE   Leukocytes,Ua LARGE (A) NEGATIVE   RBC / HPF 11-20 0 - 5 RBC/hpf   WBC, UA >50 (H) 0 - 5 WBC/hpf   Bacteria, UA RARE (A) NONE SEEN   Squamous Epithelial / LPF 0-5 0 - 5   WBC  Clumps PRESENT    Mucus PRESENT    Hyaline Casts, UA PRESENT    Non Squamous Epithelial 0-5 (A) NONE SEEN    Comment: Performed at [facility], [address] [place], [place] [address]  CBC with Differential     Status: Abnormal   Collection Time: 05/08/19 10:13 AM  Result Value Ref Range   WBC 24.2 (H) 4.0 - 10.5 K/uL   RBC 5.16 (H) 3.87 - 5.11 MIL/uL   Hemoglobin 11.7 (L) 12.0 - 15.0 g/dL   HCT 38.8 36.0 - 46.0 %   MCV 75.2 (L) 80.0 - 100.0 fL   MCH 22.7 (L) 26.0 - 34.0 pg   MCHC 30.2 30.0 - 36.0 g/dL   RDW 17.4 (H) 11.5 - 15.5 %   Platelets 412 (H) 150 - 400 K/uL   nRBC 0.0 0.0 - 0.2 %   Neutrophils Relative % 88 %   Neutro Abs 21.2 (H) 1.7 - 7.7 K/uL   Lymphocytes Relative 5 %   Lymphs Abs 1.2 0.7 - 4.0 K/uL   Monocytes Relative 6 %   Monocytes Absolute 1.5 (H) 0.1 - 1.0 K/uL   Eosinophils Relative 0 %   Eosinophils Absolute 0.1 0.0 - 0.5 K/uL   Basophils Relative 0 %   Basophils Absolute 0.1 0.0 - 0.1 K/uL   Immature Granulocytes 1 %   Abs Immature Granulocytes 0.15 (H) 0.00 - 0.07 K/uL  Comment: Performed at [facility], [address] [place], [place] [address]  Comprehensive metabolic panel     Status: Abnormal   Collection Time: 05/08/19 10:13 AM  Result Value Ref Range   Sodium 133 (L) 135 - 145 mmol/L   Potassium 3.5 3.5 - 5.1 mmol/L   Chloride 97 (L) 98 - 111 mmol/L   CO2 23 22 - 32 mmol/L   Glucose, Bld 200 (H) 70 - 99 mg/dL   BUN 18 8 - 23 mg/dL   Creatinine, Ser 1.54 (H) 0.44 - 1.00 mg/dL   Calcium 8.4 (L) 8.9 - 10.3 mg/dL   Total Protein 6.7 6.5 - 8.1 g/dL   Albumin 3.1 (L) 3.5 - 5.0 g/dL   AST 27 15 - 41 U/L   ALT 19 0 - 44 U/L   Alkaline Phosphatase 81 38 - 126 U/L   Total Bilirubin 0.8 0.3 - 1.2 mg/dL   GFR calc non Af Amer 33 (L) >60 mL/min   GFR calc Af Amer 38 (L) >60 mL/min   Anion gap 13 5 - 15    Comment: Performed at [facility], [address], [place], [place] [address]  Hemoglobin A1c     Status: Abnormal   Collection Time:  05/08/19 10:13 AM  Result Value Ref Range   Hgb A1c MFr Bld 6.2 (H) 4.8 - 5.6 %    Comment: (NOTE) Pre diabetes:          5.7%-6.4% Diabetes:              >6.4% Glycemic control for   <7.0% adults with diabetes    Mean Plasma Glucose 131.24 mg/dL    Comment: Performed at [facility], [address] [place], [place] [address]  Culture, blood (routine x 2)     Status: None (Preliminary result)   Collection Time: 05/08/19  1:07 PM   Specimen: BLOOD LEFT ARM  Result Value Ref Range   Specimen Description BLOOD LEFT ARM    Special Requests      BOTTLES DRAWN AEROBIC AND ANAEROBIC Blood Culture adequate volume Performed at [facility], [address], [place], [place] [address]    Culture PENDING    Report Status PENDING   Culture, blood (routine x 2)     Status: None (Preliminary result)   Collection Time: 05/08/19  1:07 PM   Specimen: BLOOD LEFT ARM  Result Value Ref Range   Specimen Description BLOOD LEFT ARM    Special Requests      BOTTLES DRAWN AEROBIC ONLY Blood Culture adequate volume Performed at [facility], [address], [place], [place] [address]    Culture PENDING    Report Status PENDING   Lactic acid, plasma     Status: Abnormal   Collection Time: 05/08/19  1:07 PM  Result Value Ref Range   Lactic Acid, Venous 6.4 (HH) 0.5 - 1.9 mmol/L    Comment: CRITICAL RESULT CALLED TO, READ BACK BY AND VERIFIED WITH: OAKLEY,B AT 1405 ON 10.1.20 BY ISLEY,B Performed at [facility], [address] [place], [place] [address]   SARS Coronavirus 2 [facility] order, Performed in [facility] hospital lab) Nasopharyngeal Nasopharyngeal Swab     Status: None   Collection Time: 05/08/19  1:11 PM   Specimen: Nasopharyngeal Swab  Result Value Ref Range   SARS Coronavirus 2 NEGATIVE NEGATIVE    Comment: (NOTE) If result is NEGATIVE SARS-CoV-2 target nucleic acids are NOT DETECTED. The SARS-CoV-2 RNA is generally detectable in  upper and lower  respiratory specimens during  the acute phase of infection. The lowest  concentration of SARS-CoV-2 viral copies this assay can detect is 250  copies / mL. A negative result does not preclude SARS-CoV-2 infection  and should not be used as the sole basis for treatment or other  patient management decisions.  A negative result may occur with  improper specimen collection / handling, submission of specimen other  than nasopharyngeal swab, presence of viral mutation(s) within the  areas targeted by this assay, and inadequate number of viral copies  (<250 copies / mL). A negative result must be combined with clinical  observations, patient history, and epidemiological information. If result is POSITIVE SARS-CoV-2 target nucleic acids are DETECTED. The SARS-CoV-2 RNA is generally detectable in upper and lower  respiratory specimens dur ing the acute phase of infection.  Positive  results are indicative of active infection with SARS-CoV-2.  Clinical  correlation with patient history and other diagnostic information is  necessary to determine patient infection status.  Positive results do  not rule out bacterial infection or co-infection with other viruses. If result is PRESUMPTIVE POSTIVE SARS-CoV-2 nucleic acids MAY BE PRESENT.   A presumptive positive result was obtained on the submitted specimen  and confirmed on repeat testing.  While 2019 novel coronavirus  (SARS-CoV-2) nucleic acids may be present in the submitted sample  additional confirmatory testing may be necessary for epidemiological  and / or clinical management purposes  to differentiate between  SARS-CoV-2 and other Sarbecovirus currently known to infect humans.  If clinically indicated additional testing with an alternate test  methodology [phone]) is advised. The SARS-CoV-2 RNA is generally  detectable in upper and lower respiratory sp ecimens during the acute  phase of infection. The expected result is Negative. Fact Sheet for Patients:   StrictlyIdeas.no Fact Sheet for Healthcare Providers: BankingDealers.co.za This test is not yet approved or cleared by the [place] FDA and has been authorized for detection and/or diagnosis of SARS-CoV-2 by FDA under an Emergency Use Authorization (EUA).  This EUA will remain in effect (meaning this test can be used) for the duration of the COVID-19 declaration under Section 564(b)(1) of the Act, 21 U.S.C. section 360bbb-3(b)(1), unless the authorization is terminated or revoked sooner. Performed at [facility], [address] [place], [place] [address]   MRSA PCR Screening     Status: None   Collection Time: 05/08/19  2:30 PM   Specimen: Nasal Mucosa; Nasopharyngeal  Result Value Ref Range   MRSA by PCR NEGATIVE NEGATIVE    Comment:        The GeneXpert MRSA Assay (FDA approved for NASAL specimens only), is one component of a comprehensive MRSA colonization surveillance program. It is not intended to diagnose MRSA infection nor to guide or monitor treatment for MRSA infections. Performed at [facility], [address], [place], [place] [address]   Lactic acid, plasma     Status: None   Collection Time: 05/08/19  3:01 PM  Result Value Ref Range   Lactic Acid, Venous 1.6 0.5 - 1.9 mmol/L    Comment: Performed at [facility], [address], [place], [place] [address]  Comprehensive metabolic panel     Status: Abnormal   Collection Time: 05/09/19  4:18 AM  Result Value Ref Range   Sodium 138 135 - 145 mmol/L   Potassium 3.1 (L) 3.5 - 5.1 mmol/L   Chloride 110 98 - 111 mmol/L   CO2 22 22 - 32  mmol/L   Glucose, Bld 126 (H) 70 - 99 mg/dL   BUN 16 8 - 23 mg/dL   Creatinine, Ser 1.30 (H) 0.44 - 1.00 mg/dL   Calcium 6.9 (L) 8.9 - 10.3 mg/dL   Total Protein 5.1 (L) 6.5 - 8.1 g/dL   Albumin 2.2 (L) 3.5 - 5.0 g/dL   AST 18 15 - 41 U/L   ALT 15 0 - 44 U/L   Alkaline Phosphatase 68 38 - 126 U/L   Total Bilirubin 0.3 0.3 - 1.2  mg/dL   GFR calc non Af Amer 41 (L) >60 mL/min   GFR calc Af Amer 47 (L) >60 mL/min   Anion gap 6 5 - 15    Comment: Performed at [facility], [address], [place], [place] [address]  Magnesium     Status: None   Collection Time: 05/09/19  4:18 AM  Result Value Ref Range   Magnesium 1.9 1.7 - 2.4 mg/dL    Comment: Performed at [facility], [address], [place], [place] [address]  CBC WITH DIFFERENTIAL     Status: Abnormal   Collection Time: 05/09/19  4:18 AM  Result Value Ref Range   WBC 15.8 (H) 4.0 - 10.5 K/uL   RBC 4.11 3.87 - 5.11 MIL/uL   Hemoglobin 9.5 (L) 12.0 - 15.0 g/dL   HCT 32.1 (L) 36.0 - 46.0 %   MCV 78.1 (L) 80.0 - 100.0 fL   MCH 23.1 (L) 26.0 - 34.0 pg   MCHC 29.6 (L) 30.0 - 36.0 g/dL   RDW 17.7 (H) 11.5 - 15.5 %   Platelets 276 150 - 400 K/uL   nRBC 0.0 0.0 - 0.2 %   Neutrophils Relative % 85 %   Neutro Abs 13.5 (H) 1.7 - 7.7 K/uL   Lymphocytes Relative 7 %   Lymphs Abs 1.2 0.7 - 4.0 K/uL   Monocytes Relative 7 %   Monocytes Absolute 1.0 0.1 - 1.0 K/uL   Eosinophils Relative 0 %   Eosinophils Absolute 0.0 0.0 - 0.5 K/uL   Basophils Relative 0 %   Basophils Absolute 0.0 0.0 - 0.1 K/uL   Immature Granulocytes 1 %   Abs Immature Granulocytes 0.09 (H) 0.00 - 0.07 K/uL    Comment: Performed at [facility], [address], [place], [place] [address]  Lactic acid, plasma     Status: None   Collection Time: 05/09/19  4:18 AM  Result Value Ref Range   Lactic Acid, Venous 1.2 0.5 - 1.9 mmol/L    Comment: Performed at [facility], [address], [place], [place] [address]    ABGS No results for input(s): PHART, PO2ART, TCO2, HCO3 in the last 72 hours.  Invalid input(s): PCO2 CULTURES Recent Results (from the past 240 hour(s))  Culture, blood (routine x 2)     Status: None (Preliminary result)   Collection Time: 05/08/19  1:07 PM   Specimen: BLOOD LEFT ARM  Result Value Ref Range Status   Specimen Description BLOOD LEFT ARM  Final   Special Requests    Final    BOTTLES DRAWN AEROBIC AND ANAEROBIC Blood Culture adequate volume Performed at [facility], [address] [place], [place] [address]    Culture PENDING  Incomplete   Report Status PENDING  Incomplete  Culture, blood (routine x 2)     Status: None (Preliminary result)   Collection Time: 05/08/19  1:07 PM   Specimen: BLOOD LEFT ARM  Result Value Ref Range Status  Specimen Description BLOOD LEFT ARM  Final   Special Requests   Final    BOTTLES DRAWN AEROBIC ONLY Blood Culture adequate volume Performed at [facility], [address] [place], [place] [address]    Culture PENDING  Incomplete   Report Status PENDING  Incomplete  SARS Coronavirus 2 [facility] order, Performed in [facility] hospital lab) Nasopharyngeal Nasopharyngeal Swab     Status: None   Collection Time: 05/08/19  1:11 PM   Specimen: Nasopharyngeal Swab  Result Value Ref Range Status   SARS Coronavirus 2 NEGATIVE NEGATIVE Final    Comment: (NOTE) If result is NEGATIVE SARS-CoV-2 target nucleic acids are NOT DETECTED. The SARS-CoV-2 RNA is generally detectable in upper and lower  respiratory specimens during the acute phase of infection. The lowest  concentration of SARS-CoV-2 viral copies this assay can detect is 250  copies / mL. A negative result does not preclude SARS-CoV-2 infection  and should not be used as the sole basis for treatment or other  patient management decisions.  A negative result may occur with  improper specimen collection / handling, submission of specimen other  than nasopharyngeal swab, presence of viral mutation(s) within the  areas targeted by this assay, and inadequate number of viral copies  (<250 copies / mL). A negative result must be combined with clinical  observations, patient history, and epidemiological information. If result is POSITIVE SARS-CoV-2 target nucleic acids are DETECTED. The SARS-CoV-2 RNA is generally detectable in upper and lower  respiratory specimens  dur ing the acute phase of infection.  Positive  results are indicative of active infection with SARS-CoV-2.  Clinical  correlation with patient history and other diagnostic information is  necessary to determine patient infection status.  Positive results do  not rule out bacterial infection or co-infection with other viruses. If result is PRESUMPTIVE POSTIVE SARS-CoV-2 nucleic acids MAY BE PRESENT.   A presumptive positive result was obtained on the submitted specimen  and confirmed on repeat testing.  While 2019 novel coronavirus  (SARS-CoV-2) nucleic acids may be present in the submitted sample  additional confirmatory testing may be necessary for epidemiological  and / or clinical management purposes  to differentiate between  SARS-CoV-2 and other Sarbecovirus currently known to infect humans.  If clinically indicated additional testing with an alternate test  methodology [phone]) is advised. The SARS-CoV-2 RNA is generally  detectable in upper and lower respiratory sp ecimens during the acute  phase of infection. The expected result is Negative. Fact Sheet for Patients:  StrictlyIdeas.no Fact Sheet for Healthcare Providers: BankingDealers.co.za This test is not yet approved or cleared by the [place] FDA and has been authorized for detection and/or diagnosis of SARS-CoV-2 by FDA under an Emergency Use Authorization (EUA).  This EUA will remain in effect (meaning this test can be used) for the duration of the COVID-19 declaration under Section 564(b)(1) of the Act, 21 U.S.C. section 360bbb-3(b)(1), unless the authorization is terminated or revoked sooner. Performed at [facility], [address] [place], [place] [address]   MRSA PCR Screening     Status: None   Collection Time: 05/08/19  2:30 PM   Specimen: Nasal Mucosa; Nasopharyngeal  Result Value Ref Range Status   MRSA by PCR NEGATIVE NEGATIVE Final    Comment:         The GeneXpert MRSA Assay (FDA approved for NASAL specimens only), is one component of a comprehensive MRSA colonization surveillance program. It is not intended to diagnose MRSA infection nor  to guide or monitor treatment for MRSA infections. Performed at [facility], [address], [place], [place] [address]    Studies/Results: Dg Retrograde Pyelogram  Result Date: 05/09/2019 CLINICAL DATA:  Right ureteral calculus. EXAM: RETROGRADE PYELOGRAM COMPARISON:  CT dated 05/08/2019 FINDINGS: There is a retrograde injection of contrast through the right ureter. No filling defect was identified. Final images demonstrate a ureteral stent projecting over the right renal pelvis and upper pole calyx. Contrast is seen within the collecting system. IMPRESSION: Retrograde pyelogram as above. Electronically Signed   By: Constance Holster M.D.   On: 05/09/2019 08:02   Ct Renal Stone Study  Result Date: 05/08/2019 CLINICAL DATA:  Flank pain and hematuria EXAM: CT ABDOMEN AND PELVIS WITHOUT CONTRAST TECHNIQUE: Multidetector CT imaging of the abdomen and pelvis was performed following the standard protocol without oral or IV contrast. COMPARISON:  August 29, 2018 FINDINGS: Lower chest: There is no lung base edema or consolidation. There is a small hiatal hernia. Hepatobiliary: There are scattered tiny calcifications in the liver, likely indicative of prior granulomatous disease. Beyond the small calcifications, no focal liver lesions are evident. Gallbladder is absent. There is no appreciable biliary duct dilatation. Pancreas: There is fatty infiltration in portions of pancreas. No pancreatic mass or inflammatory focus evident. Spleen: No splenic lesions appreciable. Adrenals/Urinary Tract: Right adrenal appears normal. There is a stable left adrenal mass measuring 2.2 x 2.0 cm, indeterminate by CT criteria. Right kidney is mildly edematous. There is no evident pelvic mass on either side. There is mild  hydronephrosis on the right. There is no appreciable hydronephrosis on the left. There is no intrarenal calculus on either side. There is a calculus in the right ureter at the mid sacral level measuring 6 x 4 mm. No other ureteral calculi are evident. The wall of the urinary bladder is thickened diffusely. There is mild mesenteric stranding adjacent to the urinary bladder. Stomach/Bowel: There are multiple sigmoid diverticula. There is slight thickening of the adjacent mesentery at the levels of the distal descending colon and proximal sigmoid colon. Suspect mild and potentially chronic diverticulitis. No associated fluid. No abscess or perforation. There is no appreciable bowel wall or mesenteric thickening. No evident bowel obstruction. Terminal ileum appears normal. No evident free air or portal venous air. Vascular/Lymphatic: No abdominal aortic aneurysm. There are foci of aortic atherosclerosis. There is no appreciable adenopathy in the abdomen or pelvis. Reproductive: Uterus is midline.  No pelvic masses are evident. Other: The appendix appears normal. There is no appreciable abscess or ascites in the abdomen or pelvis. Slight fat is noted in the umbilicus. Musculoskeletal: There is spinal stenosis at L4-5 due to diffuse disc protrusion and bony hypertrophy. There is degenerative change at multiple sites in the lower thoracic and lumbar regions. No blastic or lytic bone lesions evident. No intramuscular lesions are appreciable. IMPRESSION: 1. 6 x 4 mm calculus in the right ureter at the mid sacral level causing mild hydronephrosis on the right. Right kidney is subtly edematous. 2. Urinary bladder wall is diffusely thickened. There is soft tissue stranding adjacent to the urinary bladder, likely of inflammatory etiology. 3. Extensive sigmoid and distal descending colonic diverticulosis. Mild mesenteric thickening at the level of the distal descending colon and proximal sigmoid colon may represent mild and  potentially chronic diverticulitis. Somewhat similar changes noted on prior CT. No fluid in this area of suspected mild inflammation of the distal descending and proximal sigmoid colon. No perforation or abscess. 4. Spinal stenosis at L4-5  due to diffuse disc protrusion and bony hypertrophy. 5. Stable left adrenal mass. This mass has been followed with stability for less than 1 year. A lesion of this nature should be followed without change for a minimum of 1 year. In this regard, a follow-up CT in 1 year to confirm stability of the left adrenal mass advised. Note that based on attenuation criteria, it is an indeterminate category. 6. No bowel obstruction. No abscess in the abdomen or pelvis. Appendix appears normal. 7.  Small hiatal hernia. Electronically Signed   By: Lowella Grip III M.D.   On: 05/08/2019 11:12    Medications:  Prior to Admission:  Medications Prior to Admission  Medication Sig Dispense Refill Last Dose  . cholecalciferol (VITAMIN D3) 25 MCG (1000 UT) tablet Take 1,000 Units by mouth daily.   05/07/2019 at Unknown time  . levothyroxine (SYNTHROID, LEVOTHROID) 25 MCG tablet Take 37.5 mcg by mouth daily.    05/08/2019 at Unknown time  . Multiple Vitamins-Minerals (MULTIVITAMIN ADULT PO) Take 1 tablet by mouth daily.   05/07/2019 at Unknown time  . pantoprazole (PROTONIX) 40 MG tablet Take 40 mg by mouth daily.    05/08/2019 at Unknown time  . Thiamine HCl (VITAMIN B-1 PO) Take 1 tablet by mouth daily.   05/07/2019 at Unknown time  . traZODone (DESYREL) 50 MG tablet Take 1 tablet by mouth at bedtime.   05/07/2019 at Unknown time  . traMADol (ULTRAM) 50 MG tablet Take 1-2 tablets by mouth every 6 (six) hours as needed.   Unknown at Unknown time   Scheduled: . acetaminophen  650 mg Oral Q6H   Or  . acetaminophen  650 mg Rectal Q6H  . Chlorhexidine Gluconate Cloth  6 each Topical Daily  . enoxaparin (LOVENOX) injection  40 mg Subcutaneous Q24H  . [START ON 05/10/2019] influenza  vaccine adjuvanted  0.5 mL Intramuscular Tomorrow-1000  . levothyroxine  37.5 mcg Oral Q0600  . pantoprazole  40 mg Oral Daily  . traZODone  50 mg Oral QHS   Continuous: . sodium chloride 150 mL/hr at 05/09/19 0746  . cefTRIAXone (ROCEPHIN)  IV     YHC:[id number], HYDROmorphone (DILAUDID) injection, ondansetron **OR** ondansetron (ZOFRAN) IV  Assesment: She was admitted with severe sepsis.  She is still mildly tachycardic but lactate has improved markedly.  She has gross hematuria likely related to the kidney stone.  She has nephrolithiasis and has a stent in place and has Foley catheter which needs to stay in for about 5 days.  She has urinary tract infection and she is being treated with ceftriaxone and is better but we are awaiting sensitivities  She was severely dehydrated on admission and that is improved  She has a adrenal mass and that needs continued follow-up  She has hypokalemia and hypocalcemia and these will need to be treated.  Her albumin is low at 2.2 which may be the cause of her low calcium  She had marked leukocytosis which is better.  She had thrombocytosis and that is back to normal and probably related to her sepsis.  She is anemic and that may be related to her gross hematuria Principal Problem:   Severe sepsis (HCC) Active Problems:   Leukocytosis   Abnormal CT of the abdomen   Nephrolithiasis   Rigors   Hypothyroidism   UTI (urinary tract infection)   Gross hematuria   Renal colic on right side   Hydronephrosis of right kidney   Dehydration   AKI (acute kidney injury) (  [place])   Thrombocytosis ([place])    Plan: Continue IV fluids.  Replace her potassium.  Replace calcium if needed.  No other treatments right now.  Await cultures and see if she needs change in her antibiotics.    LOS: 1 day   Alonza Bogus 05/09/2019, 8:05 AM

## 2019-05-09 NOTE — Plan of Care (Signed)
Pt will have foley for about 5 days according to Dr.

## 2019-05-10 DIAGNOSIS — N390 Urinary tract infection, site not specified: Secondary | ICD-10-CM

## 2019-05-10 DIAGNOSIS — E860 Dehydration: Secondary | ICD-10-CM

## 2019-05-10 DIAGNOSIS — R310 Gross hematuria: Secondary | ICD-10-CM

## 2019-05-10 DIAGNOSIS — R6520 Severe sepsis without septic shock: Secondary | ICD-10-CM

## 2019-05-10 DIAGNOSIS — A419 Sepsis, unspecified organism: Principal | ICD-10-CM

## 2019-05-10 DIAGNOSIS — D473 Essential (hemorrhagic) thrombocythemia: Secondary | ICD-10-CM

## 2019-05-10 DIAGNOSIS — N12 Tubulo-interstitial nephritis, not specified as acute or chronic: Secondary | ICD-10-CM

## 2019-05-10 DIAGNOSIS — R319 Hematuria, unspecified: Secondary | ICD-10-CM

## 2019-05-10 DIAGNOSIS — N1330 Unspecified hydronephrosis: Secondary | ICD-10-CM

## 2019-05-10 DIAGNOSIS — N179 Acute kidney failure, unspecified: Secondary | ICD-10-CM

## 2019-05-10 DIAGNOSIS — E039 Hypothyroidism, unspecified: Secondary | ICD-10-CM

## 2019-05-10 LAB — COMPREHENSIVE METABOLIC PANEL
ALT: 23 U/L (ref 0–44)
AST: 22 U/L (ref 15–41)
Albumin: 2.2 g/dL — ABNORMAL LOW (ref 3.5–5.0)
Alkaline Phosphatase: 75 U/L (ref 38–126)
Anion gap: 7 (ref 5–15)
BUN: 11 mg/dL (ref 8–23)
CO2: 21 mmol/L — ABNORMAL LOW (ref 22–32)
Calcium: 7.4 mg/dL — ABNORMAL LOW (ref 8.9–10.3)
Chloride: 111 mmol/L (ref 98–111)
Creatinine, Ser: 1.07 mg/dL — ABNORMAL HIGH (ref 0.44–1.00)
GFR calc Af Amer: 60 mL/min — ABNORMAL LOW (ref >60)
GFR calc non Af Amer: 51 mL/min — ABNORMAL LOW (ref >60)
Glucose, Bld: 104 mg/dL — ABNORMAL HIGH (ref 70–99)
Potassium: 2.8 mmol/L — ABNORMAL LOW (ref 3.5–5.1)
Sodium: 139 mmol/L (ref 135–145)
Total Bilirubin: 0.2 mg/dL — ABNORMAL LOW (ref 0.3–1.2)
Total Protein: 5.2 g/dL — ABNORMAL LOW (ref 6.5–8.1)

## 2019-05-10 LAB — CBC WITH DIFFERENTIAL/PLATELET
Abs Immature Granulocytes: 0.05 10*3/uL (ref 0.00–0.07)
Basophils Absolute: 0 10*3/uL (ref 0.0–0.1)
Basophils Relative: 1 %
Eosinophils Absolute: 0 10*3/uL (ref 0.0–0.5)
Eosinophils Relative: 1 %
HCT: 34.6 % — ABNORMAL LOW (ref 36.0–46.0)
Hemoglobin: 10 g/dL — ABNORMAL LOW (ref 12.0–15.0)
Immature Granulocytes: 1 %
Lymphocytes Relative: 11 %
Lymphs Abs: 0.9 10*3/uL (ref 0.7–4.0)
MCH: 22.6 pg — ABNORMAL LOW (ref 26.0–34.0)
MCHC: 28.9 g/dL — ABNORMAL LOW (ref 30.0–36.0)
MCV: 78.1 fL — ABNORMAL LOW (ref 80.0–100.0)
Monocytes Absolute: 0.6 10*3/uL (ref 0.1–1.0)
Monocytes Relative: 8 %
Neutro Abs: 6.6 10*3/uL (ref 1.7–7.7)
Neutrophils Relative %: 78 %
Platelets: 221 10*3/uL (ref 150–400)
RBC: 4.43 MIL/uL (ref 3.87–5.11)
RDW: 18 % — ABNORMAL HIGH (ref 11.5–15.5)
WBC: 8.2 10*3/uL (ref 4.0–10.5)
nRBC: 0.2 % (ref 0.0–0.2)

## 2019-05-10 LAB — MAGNESIUM: Magnesium: 1.8 mg/dL (ref 1.7–2.4)

## 2019-05-10 MED ORDER — POTASSIUM CHLORIDE CRYS ER 20 MEQ PO TBCR
40.0000 meq | EXTENDED_RELEASE_TABLET | ORAL | Status: AC
  Administered 2019-05-10 (×3): 40 meq via ORAL
  Filled 2019-05-10 (×3): qty 2

## 2019-05-10 NOTE — Progress Notes (Signed)
PROGRESS NOTE    GARRY BOCHICCHIO  GQQ:[id number] DOB: 1945/11/24 DOA: 05/08/2019 PCP: Sinda Du, MD     Brief Narrative:  73 y.o. female went to the urgent care several days ago because she has been having severe right flank pain since 05/05/2019.  She was told that she did not have a UTI but there was a chance that she had a kidney stone.  She has been having gross hematuria.  She is also been having urinary frequency.  She has had some nausea and some vomiting.  She overall feels well.  She has no chest pain.  She did not have fever but since she has been in the emergency department she started to have rigors.  In the ED she was noted to be clinically dehydrated with a sinus tachycardia and elevated lactic acid of 6.4.  She was started on IV fluids.  Blood cultures obtained and started on IV ceftriaxone.  She was given an additional dose of ceftriaxone when she started with the rigors to cover for suspected bacteremia.    She had a CT stone study with findings of a large 6 x 4 mm right ureteral stone with subsequent hydronephrosis.  Urology Dr. Junious Silk was consulted who felt that patient likely is going to require operative management later today with plans for stent placement later today.  Patient is being admitted to the medical service for further treatment of severe sepsis secondary to this large ureteral stone.   Assessment & Plan: 1-Severe sepsis ([place]): due to pyelonephritis. -sepsis features overall resolved -no fever, no WBC's and renal function essentially back to normal -will advance diet and follow tolerance -continue electrolyte repletion  -outpatient follow up with urology service  2-Nephrolithiasis -s/p stent placement by Dr. Junious Silk  -outpatient follow up with urology service. -discontinue foley -continue adequate hydration   3-Hypothyroidism -continue levothyroxine   4-Gross hematuria -due to nephrolithiasis -urine clearing up appropriately    5-AKI/Hydronephrosis of right kidney -in the setting of obstructive uropathy -status post stent placement -continue proper hydration -follow up with urology as an outpatient -repeat BMET to follow renal function   6-Thrombocytosis ([facility]CC)/Hgb drop -in the setting of hemodilution from IVF's resuscitation and hematuria -Hgb stable and rising now -no need for transfusion Hemodynamically stable -follow Hgb trend   7-hypokalemia -will continue repletion -Mg WNL -continue monitoring on telemetry for now.    DVT prophylaxis: SCD's Code Status: Full Code. Family Communication: no family at bedside  Disposition Plan: will transfer to telemetry bed, attempt foley discontinuation, continue IV antibiotics, advance diet and continue electrolyte repletion.  Consultants:   Urology service.   Procedures:   See below for x-ray reports.  Status post ureteral stent placement.   Antimicrobials:  Anti-infectives (From admission, onward)   Start     Dose/Rate Route Frequency Ordered Stop   05/09/19 1100  cefTRIAXone (ROCEPHIN) 2 g in sodium chloride 0.9 % 100 mL IVPB     2 g 200 mL/hr over 30 Minutes Intravenous Every 24 hours 05/08/19 1903     05/08/19 1345  cefTRIAXone (ROCEPHIN) 1 g in sodium chloride 0.9 % 100 mL IVPB     1 g 200 mL/hr over 30 Minutes Intravenous  Once 05/08/19 1340 05/08/19 1449   05/08/19 1130  cefTRIAXone (ROCEPHIN) 1 g in sodium chloride 0.9 % 100 mL IVPB     1 g 200 mL/hr over 30 Minutes Intravenous  Once 05/08/19 1116 05/08/19 1202      Subjective: Afebrile, no CP, no  nausea, no vomiting. Still complaining of right flank pain. Appetite is poor. Potassium still significantly low.    Objective: Vitals:   05/10/19 0700 05/10/19 0749 05/10/19 0800 05/10/19 1015  BP: (!) 153/74  (!) 113/57 125/65  Pulse: 92  93 84  Resp: (!) 25  16 20   Temp:  98.2 F (36.8 C)    TempSrc:  Oral    SpO2: 96%  92% 97%  Weight:      Height:        Intake/Output Summary  (Last 24 hours) at 05/10/2019 1109 Last data filed at 05/10/2019 0734 Gross per 24 hour  Intake 2799.8 ml  Output 3485 ml  Net -685.2 ml   Filed Weights   05/08/19 1904 05/09/19 0600 05/10/19 0500  Weight: 86.3 kg 88.6 kg 89.8 kg    Examination: General exam: Alert, awake, oriented x 3; afebrile, still complaining of right flank pain. No nausea, no vomiting.  Respiratory system: Clear to auscultation. Respiratory effort normal. Cardiovascular system:RRR. No murmurs, rubs, gallops. Gastrointestinal system: Abdomen is nondistended, soft and mildly/moderate tenderness on right flank area. No organomegaly or masses felt. Normal bowel sounds heard. Central nervous system: Alert and oriented. No focal neurological deficits. Extremities: No C/C/E, +pedal pulses Skin: No rashes, lesions or ulcers Psychiatry: Judgement and insight appear normal. Mood & affect appropriate.     Data Reviewed: I have personally reviewed following labs and imaging studies  CBC: Recent Labs  Lab 05/08/19 1013 05/09/19 0418 05/10/19 0508  WBC 24.2* 15.8* 8.2  NEUTROABS 21.2* 13.5* 6.6  HGB 11.7* 9.5* 10.0*  HCT 38.8 32.1* 34.6*  MCV 75.2* 78.1* 78.1*  PLT 412* 276 706   Basic Metabolic Panel: Recent Labs  Lab 05/08/19 1013 05/09/19 0418 05/10/19 0508  NA 133* 138 139  K 3.5 3.1* 2.8*  CL 97* 110 111  CO2 23 22 21*  GLUCOSE 200* 126* 104*  BUN 18 16 11   CREATININE 1.54* 1.30* 1.07*  CALCIUM 8.4* 6.9* 7.4*  MG  --  1.9 1.8   GFR: Estimated Creatinine Clearance: 54.9 mL/min (A) (by C-G formula based on SCr of 1.07 mg/dL (H)).   Liver Function Tests: Recent Labs  Lab 05/08/19 1013 05/09/19 0418 05/10/19 0508  AST 27 18 22   ALT 19 15 23   ALKPHOS 81 68 75  BILITOT 0.8 0.3 0.2*  PROT 6.7 5.1* 5.2*  ALBUMIN 3.1* 2.2* 2.2*   HbA1C: Recent Labs    05/08/19 1013  HGBA1C 6.2*   Urine analysis:    Component Value Date/Time   COLORURINE YELLOW 05/08/2019 0954   APPEARANCEUR HAZY (A)  05/08/2019 0954   LABSPEC 1.008 05/08/2019 0954   PHURINE 6.0 05/08/2019 0954   GLUCOSEU NEGATIVE 05/08/2019 0954   HGBUR MODERATE (A) 05/08/2019 [place] 05/08/2019 [place] 05/08/2019 0954   PROTEINUR 100 (A) 05/08/2019 0954   NITRITE NEGATIVE 05/08/2019 0954   LEUKOCYTESUR LARGE (A) 05/08/2019 0954    Recent Results (from the past 240 hour(s))  Urine culture     Status: Abnormal   Collection Time: 05/08/19  9:54 AM   Specimen: Urine, Clean Catch  Result Value Ref Range Status   Specimen Description   Final    URINE, CLEAN CATCH Performed at [facility], [address], [place], [place] [address]    Special Requests   Final    NONE Performed at [facility], [address] [place], [place] [address]    Culture (A)  Final    <  10,000 COLONIES/mL INSIGNIFICANT GROWTH Performed at [facility], [place] [address], [place], [place] [address]    Report Status 05/09/2019 FINAL  Final  Culture, blood (routine x 2)     Status: None (Preliminary result)   Collection Time: 05/08/19  1:07 PM   Specimen: BLOOD LEFT ARM  Result Value Ref Range Status   Specimen Description BLOOD LEFT ARM  Final   Special Requests   Final    BOTTLES DRAWN AEROBIC AND ANAEROBIC Blood Culture adequate volume   Culture   Final    NO GROWTH 2 DAYS Performed at [facility], [address] [place], [place] [address]    Report Status PENDING  Incomplete  Culture, blood (routine x 2)     Status: None (Preliminary result)   Collection Time: 05/08/19  1:07 PM   Specimen: BLOOD LEFT ARM  Result Value Ref Range Status   Specimen Description BLOOD LEFT ARM  Final   Special Requests   Final    BOTTLES DRAWN AEROBIC ONLY Blood Culture adequate volume   Culture   Final    NO GROWTH 2 DAYS Performed at [facility], [address] [place] [address], [place], [place] [address]    Report Status PENDING  Incomplete  SARS Coronavirus 2 [facility] order, Performed in [place] hospital lab)  Nasopharyngeal Nasopharyngeal Swab     Status: None   Collection Time: 05/08/19  1:11 PM   Specimen: Nasopharyngeal Swab  Result Value Ref Range Status   SARS Coronavirus 2 NEGATIVE NEGATIVE Final    Comment: (NOTE) If result is NEGATIVE SARS-CoV-2 target nucleic acids are NOT DETECTED. The SARS-CoV-2 RNA is generally detectable in upper and lower  respiratory specimens during the acute phase of infection. The lowest  concentration of SARS-CoV-2 viral copies this assay can detect is 250  copies / mL. A negative result does not preclude SARS-CoV-2 infection  and should not be used as the sole basis for treatment or other  patient management decisions.  A negative result may occur with  improper specimen collection / handling, submission of specimen other  than nasopharyngeal swab, presence of viral mutation(s) within the  areas targeted by this assay, and inadequate number of viral copies  (<250 copies / mL). A negative result must be combined with clinical  observations, patient history, and epidemiological information. If result is POSITIVE SARS-CoV-2 target nucleic acids are DETECTED. The SARS-CoV-2 RNA is generally detectable in upper and lower  respiratory specimens dur ing the acute phase of infection.  Positive  results are indicative of active infection with SARS-CoV-2.  Clinical  correlation with patient history and other diagnostic information is  necessary to determine patient infection status.  Positive results do  not rule out bacterial infection or co-infection with other viruses. If result is PRESUMPTIVE POSTIVE SARS-CoV-2 nucleic acids MAY BE PRESENT.   A presumptive positive result was obtained on the submitted specimen  and confirmed on repeat testing.  While 2019 novel coronavirus  (SARS-CoV-2) nucleic acids may be present in the submitted sample  additional confirmatory testing may be necessary for epidemiological  and / or clinical management purposes  to  differentiate between  SARS-CoV-2 and other Sarbecovirus currently known to infect humans.  If clinically indicated additional testing with an alternate test  methodology [phone]) is advised. The SARS-CoV-2 RNA is generally  detectable in upper and lower respiratory sp ecimens during the acute  phase of infection. The expected result is Negative. Fact Sheet for Patients:  StrictlyIdeas.no Fact Sheet  for Healthcare Providers: BankingDealers.co.za This test is not yet approved or cleared by the [place] and has been authorized for detection and/or diagnosis of SARS-CoV-2 by FDA under an Emergency Use Authorization (EUA).  This EUA will remain in effect (meaning this test can be used) for the duration of the COVID-19 declaration under Section 564(b)(1) of the Act, 21 U.S.C. section 360bbb-3(b)(1), unless the authorization is terminated or revoked sooner. Performed at [facility], [address] [place], [place] [address]   MRSA PCR Screening     Status: None   Collection Time: 05/08/19  2:30 PM   Specimen: Nasal Mucosa; Nasopharyngeal  Result Value Ref Range Status   MRSA by PCR NEGATIVE NEGATIVE Final    Comment:        The GeneXpert MRSA Assay (FDA approved for NASAL specimens only), is one component of a comprehensive MRSA colonization surveillance program. It is not intended to diagnose MRSA infection nor to guide or monitor treatment for MRSA infections. Performed at [facility], [address] [place], [place] [address]      Radiology Studies: Dg Retrograde Pyelogram  Result Date: 05/09/2019 CLINICAL DATA:  Right ureteral calculus. EXAM: RETROGRADE PYELOGRAM COMPARISON:  CT dated 05/08/2019 FINDINGS: There is a retrograde injection of contrast through the right ureter. No filling defect was identified. Final images demonstrate a ureteral stent projecting over the right renal pelvis and upper pole calyx. Contrast is  seen within the collecting system. IMPRESSION: Retrograde pyelogram as above. Electronically Signed   By: Constance Holster M.D.   On: 05/09/2019 08:02     Scheduled Meds: . acetaminophen  650 mg Oral Q6H   Or  . acetaminophen  650 mg Rectal Q6H  . Chlorhexidine Gluconate Cloth  6 each Topical Daily  . influenza vaccine adjuvanted  0.5 mL Intramuscular Tomorrow-1000  . levothyroxine  37.5 mcg Oral Q0600  . pantoprazole  40 mg Oral Daily  . potassium chloride  40 mEq Oral Q4H  . traZODone  50 mg Oral QHS   Continuous Infusions: . sodium chloride 100 mL/hr at 05/10/19 0903  . cefTRIAXone (ROCEPHIN)  IV 2 g (05/10/19 1035)     LOS: 2 days    Time spent: 30 minutes.  Barton Dubois, MD [facility] Pager [phone]   05/10/2019, 11:09 AM

## 2019-05-11 DIAGNOSIS — N132 Hydronephrosis with renal and ureteral calculous obstruction: Secondary | ICD-10-CM

## 2019-05-11 DIAGNOSIS — N12 Tubulo-interstitial nephritis, not specified as acute or chronic: Secondary | ICD-10-CM

## 2019-05-11 DIAGNOSIS — N200 Calculus of kidney: Secondary | ICD-10-CM

## 2019-05-11 LAB — BASIC METABOLIC PANEL WITH GFR
Anion gap: 8 (ref 5–15)
BUN: 10 mg/dL (ref 8–23)
CO2: 22 mmol/L (ref 22–32)
Calcium: 7.8 mg/dL — ABNORMAL LOW (ref 8.9–10.3)
Chloride: 113 mmol/L — ABNORMAL HIGH (ref 98–111)
Creatinine, Ser: 1.1 mg/dL — ABNORMAL HIGH (ref 0.44–1.00)
GFR calc Af Amer: 58 mL/min — ABNORMAL LOW (ref >60)
GFR calc non Af Amer: 50 mL/min — ABNORMAL LOW (ref >60)
Glucose, Bld: 118 mg/dL — ABNORMAL HIGH (ref 70–99)
Potassium: 3.4 mmol/L — ABNORMAL LOW (ref 3.5–5.1)
Sodium: 143 mmol/L (ref 135–145)

## 2019-05-11 MED ORDER — TRAMADOL HCL 50 MG PO TABS: 50.0000 mg | ORAL_TABLET | Freq: Four times a day (QID) | ORAL | 0 refills | Status: DC | PRN

## 2019-05-11 MED ORDER — CEFDINIR 300 MG PO CAPS: 300.0000 mg | ORAL_CAPSULE | Freq: Two times a day (BID) | ORAL | 0 refills | Status: AC

## 2019-05-11 MED ORDER — ONDANSETRON 8 MG PO TBDP: 8.0000 mg | ORAL_TABLET | Freq: Three times a day (TID) | ORAL | 0 refills | Status: DC | PRN

## 2019-05-11 MED ORDER — HYDROCODONE-ACETAMINOPHEN 5-325 MG PO TABS: 1.0000 | ORAL_TABLET | Freq: Four times a day (QID) | ORAL | 0 refills | Status: AC | PRN

## 2019-05-11 NOTE — Plan of Care (Signed)
  Problem: Education: Goal: Knowledge of General Education information will improve Description: Including pain rating scale, medication(s)/side effects and non-pharmacologic comfort measures Outcome: Adequate for Discharge   Problem: Health Behavior/Discharge Planning: Goal: Ability to manage health-related needs will improve Outcome: Adequate for Discharge   Problem: Clinical Measurements: Goal: Ability to maintain clinical measurements within normal limits will improve Outcome: Adequate for Discharge Goal: Will remain free from infection Outcome: Adequate for Discharge Goal: Diagnostic test results will improve Outcome: Adequate for Discharge Goal: Respiratory complications will improve Outcome: Adequate for Discharge Goal: Cardiovascular complication will be avoided Outcome: Adequate for Discharge   Problem: Activity: Goal: Risk for activity intolerance will decrease Outcome: Adequate for Discharge   Problem: Nutrition: Goal: Adequate nutrition will be maintained Outcome: Adequate for Discharge   Problem: Elimination: Goal: Will not experience complications related to bowel motility Outcome: Adequate for Discharge Goal: Will not experience complications related to urinary retention Outcome: Adequate for Discharge   Problem: Pain Managment: Goal: General experience of comfort will improve Outcome: Adequate for Discharge   Problem: Safety: Goal: Ability to remain free from injury will improve Outcome: Adequate for Discharge   Problem: Skin Integrity: Goal: Risk for impaired skin integrity will decrease Outcome: Adequate for Discharge   Problem: Education: Goal: Required Educational Video(s) Outcome: Adequate for Discharge   Problem: Clinical Measurements: Goal: Postoperative complications will be avoided or minimized Outcome: Adequate for Discharge   Problem: Skin Integrity: Goal: Demonstration of wound healing without infection will improve Outcome:  Adequate for Discharge

## 2019-05-11 NOTE — Discharge Summary (Addendum)
Physician Discharge Summary  Rhonda Bailey:[id number] DOB: [birth year] DOA: 05/08/2019  PCP: Sinda Du, MD  Admit date: 05/08/2019 Discharge date: 05/11/2019  Time spent: 35 minutes  Recommendations for Outpatient Follow-up:  1. Repeat basic metabolic panel to follow electrolytes and renal function 2. Repeat CBC to follow platelets count and hemoglobin and stability. 3. Outpatient follow-up with urology service for definitive treatment of nephrolithiasis and ureteral stent removal.   Discharge Diagnoses:  Principal Problem:   Sepsis with acute organ dysfunction ([place]) Active Problems:   Leukocytosis   Abnormal CT of the abdomen   Nephrolithiasis   Rigors   Hypothyroidism   UTI (urinary tract infection)   Gross hematuria   Renal colic on right side   Ureteral stone with hydronephrosis   Dehydration   AKI (acute kidney injury) ([place])   Thrombocytosis ([place])   Pyelonephritis   Discharge Condition: Stable and improved.  Patient discharged home with instructions to follow-up with PCP and urology service after discharge.  Diet recommendation: Heart healthy diet.  Filed Weights   05/09/19 0600 05/10/19 0500 05/11/19 0338  Weight: 88.6 kg 89.8 kg 91 kg    History of present illness:  As per H&P written by Dr. Wynetta Emery on 05/08/2019 73 y.o.femalewent to the urgent care several days ago because she has been having severe right flank pain since 05/05/2019. She was told that she did not have a UTI but there was a chance that she had a kidney stone. She has been having gross hematuria. She is also been having urinary frequency. She has had some nausea and some vomiting. She overall feels well. She has no chest pain. She did not have fever but since she has been in the emergency department she started to have rigors. In the ED she was noted to be clinically dehydrated with a sinus tachycardia and elevated lactic acid of 6.4. She was started on IV fluids. Blood cultures  obtained and started on IV ceftriaxone. She was given an additional dose of ceftriaxone when she started with the rigors to cover for suspected bacteremia.  She had a CT stone study with findings of a large 6 x 4 mm right ureteral stone with subsequent hydronephrosis. Urology Dr. Junious Silk was consulted who felt that patient likely is going to require operative management later today with plans for stent placement later today. Patient is being admitted to the medical service for further treatment of severe sepsis secondary to this large ureteral stone.  Hospital Course:  1-Severe sepsis [facility]): due to pyelonephritis. -sepsis features overall resolved -no fever, no WBC's and renal function essentially back to normal -Complete antibiotic therapy by mouth as prescribed. -outpatient follow up with urology service  2-Nephrolithiasis -s/p stent placement by Dr. Junious Silk  -outpatient follow up with urology service as instructed. -continue adequate hydration  -Foley catheter discontinued on 95/01/2129 without complication; patient urinary without problems.  3-Hypothyroidism -continue levothyroxine   4-Gross hematuria -due to nephrolithiasis -urine clearing up appropriately  -Patient advised to maintain adequate hydration. -Outpatient follow-up with urology service as instructed.  5-AKI/Hydronephrosis of right kidney -in the setting of obstructive uropathy -status post stent placement -continue proper hydration -follow up with urology service as an outpatient -repeat BMET to follow renal function at follow-up visit.  6-Thrombocytosis (HCC)/Hgb drop -in the setting of hemodilution from IVF's resuscitation and hematuria -Hgb stabilizing and rising now -no need for transfusion during this hospitalization. -Hemodynamically stable -Repeat CBC at follow-up visit to reassess platelets count and to follow Hgb trend  7-hypokalemia -Repleted and Mg WNL -Repeat basic metabolic panel  follow-up visit to reassess electrolytes trend. -Patient advised to maintain adequate hydration and oral intake.  Procedures:  See below for x-ray reports.  Status post ureteral stent placement.   Consultations:  Urology service.  Discharge Exam: Vitals:   05/10/19 2132 05/11/19 0459  BP: (!) 111/54 114/62  Pulse: 86 89  Resp: 16 17  Temp: 98.5 F (36.9 C) 98.1 F (36.7 C)  SpO2: 95% 95%   General exam: Alert, awake, oriented x 3; afebrile, still complaining of mild right flank pain. No nausea, no vomiting.   Able to urinate without problem after Foley catheter removal. Respiratory system: Clear to auscultation. Respiratory effort normal. Cardiovascular system:RRR. No murmurs, rubs, gallops. Gastrointestinal system: Abdomen is nondistended, soft and mildly/moderate tenderness on right flank area. No organomegaly or masses felt. Normal bowel sounds heard. Central nervous system: Alert and oriented. No focal neurological deficits. Extremities: No C/C/E, +pedal pulses Skin: No rashes, lesions or ulcers Psychiatry: Judgement and insight appear normal. Mood & affect appropriate.    Discharge Instructions   Discharge Instructions    Discharge instructions   Complete by: As directed    Maintain adequate hydration Take medications as prescribed Arrange follow-up with PCP in 10 days Follow-up with urology service (contact office for appointment details). Follow heart healthy diet.   Increase activity slowly   Complete by: As directed      Allergies as of 05/11/2019      Reactions   Tramadol    Says "blew my head up."      Medication List    STOP taking these medications   traMADol 50 MG tablet Commonly known as: ULTRAM     TAKE these medications   cefdinir 300 MG capsule Commonly known as: OMNICEF Take 1 capsule (300 mg total) by mouth 2 (two) times daily for 5 days.   cholecalciferol 25 MCG (1000 UT) tablet Commonly known as: VITAMIN D3 Take 1,000 Units by  mouth daily.   HYDROcodone-acetaminophen 5-325 MG tablet Commonly known as: NORCO/VICODIN Take 1 tablet by mouth every 6 (six) hours as needed for up to 3 days for severe pain.   levothyroxine 25 MCG tablet Commonly known as: SYNTHROID Take 37.5 mcg by mouth daily.   MULTIVITAMIN ADULT PO Take 1 tablet by mouth daily.   ondansetron 8 MG disintegrating tablet Commonly known as: Zofran ODT Take 1 tablet (8 mg total) by mouth every 8 (eight) hours as needed for nausea or vomiting.   pantoprazole 40 MG tablet Commonly known as: PROTONIX Take 40 mg by mouth daily.   traZODone 50 MG tablet Commonly known as: DESYREL Take 1 tablet by mouth at bedtime.   VITAMIN B-1 PO Take 1 tablet by mouth daily.      Allergies  Allergen Reactions  . Tramadol     Says "blew my head up."   Follow-up Information    Call McKenzie, Candee Furbish, MD.   Specialty: Urology Contact information: [address] [place] [address] [phone]        Sinda Du, MD. Schedule an appointment as soon as possible for a visit in 10 day(s).   Specialty: Pulmonary Disease Contact information: [address] [place] [address] [phone]           The results of significant diagnostics from this hospitalization (including imaging, microbiology, ancillary and laboratory) are listed below for reference.    Significant Diagnostic Studies: Dg Retrograde Pyelogram  Result  Date: 05/09/2019 CLINICAL DATA:  Right ureteral calculus. EXAM: RETROGRADE PYELOGRAM COMPARISON:  CT dated 05/08/2019 FINDINGS: There is a retrograde injection of contrast through the right ureter. No filling defect was identified. Final images demonstrate a ureteral stent projecting over the right renal pelvis and upper pole calyx. Contrast is seen within the collecting system. IMPRESSION: Retrograde pyelogram as above. Electronically Signed   By: Constance Holster M.D.   On: 05/09/2019 08:02   Ct Renal Stone  Study  Result Date: 05/08/2019 CLINICAL DATA:  Flank pain and hematuria EXAM: CT ABDOMEN AND PELVIS WITHOUT CONTRAST TECHNIQUE: Multidetector CT imaging of the abdomen and pelvis was performed following the standard protocol without oral or IV contrast. COMPARISON:  August 29, 2018 FINDINGS: Lower chest: There is no lung base edema or consolidation. There is a small hiatal hernia. Hepatobiliary: There are scattered tiny calcifications in the liver, likely indicative of prior granulomatous disease. Beyond the small calcifications, no focal liver lesions are evident. Gallbladder is absent. There is no appreciable biliary duct dilatation. Pancreas: There is fatty infiltration in portions of pancreas. No pancreatic mass or inflammatory focus evident. Spleen: No splenic lesions appreciable. Adrenals/Urinary Tract: Right adrenal appears normal. There is a stable left adrenal mass measuring 2.2 x 2.0 cm, indeterminate by CT criteria. Right kidney is mildly edematous. There is no evident pelvic mass on either side. There is mild hydronephrosis on the right. There is no appreciable hydronephrosis on the left. There is no intrarenal calculus on either side. There is a calculus in the right ureter at the mid sacral level measuring 6 x 4 mm. No other ureteral calculi are evident. The wall of the urinary bladder is thickened diffusely. There is mild mesenteric stranding adjacent to the urinary bladder. Stomach/Bowel: There are multiple sigmoid diverticula. There is slight thickening of the adjacent mesentery at the levels of the distal descending colon and proximal sigmoid colon. Suspect mild and potentially chronic diverticulitis. No associated fluid. No abscess or perforation. There is no appreciable bowel wall or mesenteric thickening. No evident bowel obstruction. Terminal ileum appears normal. No evident free air or portal venous air. Vascular/Lymphatic: No abdominal aortic aneurysm. There are foci of aortic  atherosclerosis. There is no appreciable adenopathy in the abdomen or pelvis. Reproductive: Uterus is midline.  No pelvic masses are evident. Other: The appendix appears normal. There is no appreciable abscess or ascites in the abdomen or pelvis. Slight fat is noted in the umbilicus. Musculoskeletal: There is spinal stenosis at L4-5 due to diffuse disc protrusion and bony hypertrophy. There is degenerative change at multiple sites in the lower thoracic and lumbar regions. No blastic or lytic bone lesions evident. No intramuscular lesions are appreciable. IMPRESSION: 1. 6 x 4 mm calculus in the right ureter at the mid sacral level causing mild hydronephrosis on the right. Right kidney is subtly edematous. 2. Urinary bladder wall is diffusely thickened. There is soft tissue stranding adjacent to the urinary bladder, likely of inflammatory etiology. 3. Extensive sigmoid and distal descending colonic diverticulosis. Mild mesenteric thickening at the level of the distal descending colon and proximal sigmoid colon may represent mild and potentially chronic diverticulitis. Somewhat similar changes noted on prior CT. No fluid in this area of suspected mild inflammation of the distal descending and proximal sigmoid colon. No perforation or abscess. 4. Spinal stenosis at L4-5 due to diffuse disc protrusion and bony hypertrophy. 5. Stable left adrenal mass. This mass has been followed with stability for less than 1 year. A lesion of  this nature should be followed without change for a minimum of 1 year. In this regard, a follow-up CT in 1 year to confirm stability of the left adrenal mass advised. Note that based on attenuation criteria, it is an indeterminate category. 6. No bowel obstruction. No abscess in the abdomen or pelvis. Appendix appears normal. 7.  Small hiatal hernia. Electronically Signed   By: Lowella Grip III M.D.   On: 05/08/2019 11:12    Microbiology: Recent Results (from the past 240 hour(s))  Urine  culture     Status: Abnormal   Collection Time: 05/08/19  9:54 AM   Specimen: Urine, Clean Catch  Result Value Ref Range Status   Specimen Description   Final    URINE, CLEAN CATCH Performed at [facility], [address] [place], [place] [address]    Special Requests   Final    NONE Performed at [facility], [address], [place], [place] [address]    Culture (A)  Final    <10,000 COLONIES/mL INSIGNIFICANT GROWTH Performed at [facility] [address], [place], [place] [address]    Report Status 05/09/2019 FINAL  Final  Culture, blood (routine x 2)     Status: None (Preliminary result)   Collection Time: 05/08/19  1:07 PM   Specimen: BLOOD LEFT ARM  Result Value Ref Range Status   Specimen Description BLOOD LEFT ARM  Final   Special Requests   Final    BOTTLES DRAWN AEROBIC AND ANAEROBIC Blood Culture adequate volume   Culture   Final    NO GROWTH 3 DAYS Performed at [facility], [address], [place], [place] [address]    Report Status PENDING  Incomplete  Culture, blood (routine x 2)     Status: None (Preliminary result)   Collection Time: 05/08/19  1:07 PM   Specimen: BLOOD LEFT ARM  Result Value Ref Range Status   Specimen Description BLOOD LEFT ARM  Final   Special Requests   Final    BOTTLES DRAWN AEROBIC ONLY Blood Culture adequate volume   Culture   Final    NO GROWTH 3 DAYS Performed at [facility], [address] [place], [place] [address]    Report Status PENDING  Incomplete  SARS Coronavirus 2 [facility] order, Performed in [place] hospital lab) Nasopharyngeal Nasopharyngeal Swab     Status: None   Collection Time: 05/08/19  1:11 PM   Specimen: Nasopharyngeal Swab  Result Value Ref Range Status   SARS Coronavirus 2 NEGATIVE NEGATIVE Final    Comment: (NOTE) If result is NEGATIVE SARS-CoV-2 target nucleic acids are NOT DETECTED. The SARS-CoV-2 RNA is generally detectable in upper and lower  respiratory specimens during the acute phase of  infection. The lowest  concentration of SARS-CoV-2 viral copies this assay can detect is 250  copies / mL. A negative result does not preclude SARS-CoV-2 infection  and should not be used as the sole basis for treatment or other  patient management decisions.  A negative result may occur with  improper specimen collection / handling, submission of specimen other  than nasopharyngeal swab, presence of viral mutation(s) within the  areas targeted by this assay, and inadequate number of viral copies  (<250 copies / mL). A negative result must be combined with clinical  observations, patient history, and epidemiological information. If result is POSITIVE SARS-CoV-2 target nucleic acids are DETECTED. The SARS-CoV-2 RNA is generally detectable in upper and lower  respiratory specimens dur ing the  acute phase of infection.  Positive  results are indicative of active infection with SARS-CoV-2.  Clinical  correlation with patient history and other diagnostic information is  necessary to determine patient infection status.  Positive results do  not rule out bacterial infection or co-infection with other viruses. If result is PRESUMPTIVE POSTIVE SARS-CoV-2 nucleic acids MAY BE PRESENT.   A presumptive positive result was obtained on the submitted specimen  and confirmed on repeat testing.  While 2019 novel coronavirus  (SARS-CoV-2) nucleic acids may be present in the submitted sample  additional confirmatory testing may be necessary for epidemiological  and / or clinical management purposes  to differentiate between  SARS-CoV-2 and other Sarbecovirus currently known to infect humans.  If clinically indicated additional testing with an alternate test  methodology [phone]) is advised. The SARS-CoV-2 RNA is generally  detectable in upper and lower respiratory sp ecimens during the acute  phase of infection. The expected result is Negative. Fact Sheet for Patients:   StrictlyIdeas.no Fact Sheet for Healthcare Providers: BankingDealers.co.za This test is not yet approved or cleared by the [place] FDA and has been authorized for detection and/or diagnosis of SARS-CoV-2 by FDA under an Emergency Use Authorization (EUA).  This EUA will remain in effect (meaning this test can be used) for the duration of the COVID-19 declaration under Section 564(b)(1) of the Act, 21 U.S.C. section 360bbb-3(b)(1), unless the authorization is terminated or revoked sooner. Performed at [facility], [address], [place], [place] [address]   MRSA PCR Screening     Status: None   Collection Time: 05/08/19  2:30 PM   Specimen: Nasal Mucosa; Nasopharyngeal  Result Value Ref Range Status   MRSA by PCR NEGATIVE NEGATIVE Final    Comment:        The GeneXpert MRSA Assay (FDA approved for NASAL specimens only), is one component of a comprehensive MRSA colonization surveillance program. It is not intended to diagnose MRSA infection nor to guide or monitor treatment for MRSA infections. Performed at [facility], [address] [place], [place] [address]      Labs: Basic Metabolic Panel: Recent Labs  Lab 05/08/19 1013 05/09/19 0418 05/10/19 0508 05/11/19 0641  NA 133* 138 139 143  K 3.5 3.1* 2.8* 3.4*  CL 97* 110 111 113*  CO2 23 22 21* 22  GLUCOSE 200* 126* 104* 118*  BUN 18 16 11 10   CREATININE 1.54* 1.30* 1.07* 1.10*  CALCIUM 8.4* 6.9* 7.4* 7.8*  MG  --  1.9 1.8  --    Liver Function Tests: Recent Labs  Lab 05/08/19 1013 05/09/19 0418 05/10/19 0508  AST 27 18 22   ALT 19 15 23   ALKPHOS 81 68 75  BILITOT 0.8 0.3 0.2*  PROT 6.7 5.1* 5.2*  ALBUMIN 3.1* 2.2* 2.2*   CBC: Recent Labs  Lab 05/08/19 1013 05/09/19 0418 05/10/19 0508  WBC 24.2* 15.8* 8.2  NEUTROABS 21.2* 13.5* 6.6  HGB 11.7* 9.5* 10.0*  HCT 38.8 32.1* 34.6*  MCV 75.2* 78.1* 78.1*  PLT 412* 276 221    Signed:  Barton Dubois  MD.  [facility] 05/11/2019, 1:23 PM

## 2019-05-11 NOTE — Progress Notes (Signed)
Nsg Discharge Note  Admit Date:  05/08/2019 Discharge date: 05/11/2019   JAYMESON BAKEMAN to be D/C'd home per MD order.  AVS completed.  Copy for chart, and copy for patient signed, and dated. Patient/caregiver able to verbalize understanding.  Discharge Medication: Allergies as of 05/11/2019      Reactions   Tramadol    Says "blew my head up."      Medication List    STOP taking these medications   traMADol 50 MG tablet Commonly known as: ULTRAM     TAKE these medications   cefdinir 300 MG capsule Commonly known as: OMNICEF Take 1 capsule (300 mg total) by mouth 2 (two) times daily for 5 days.   cholecalciferol 25 MCG (1000 UT) tablet Commonly known as: VITAMIN D3 Take 1,000 Units by mouth daily.   HYDROcodone-acetaminophen 5-325 MG tablet Commonly known as: NORCO/VICODIN Take 1 tablet by mouth every 6 (six) hours as needed for up to 3 days for severe pain.   levothyroxine 25 MCG tablet Commonly known as: SYNTHROID Take 37.5 mcg by mouth daily.   MULTIVITAMIN ADULT PO Take 1 tablet by mouth daily.   ondansetron 8 MG disintegrating tablet Commonly known as: Zofran ODT Take 1 tablet (8 mg total) by mouth every 8 (eight) hours as needed for nausea or vomiting.   pantoprazole 40 MG tablet Commonly known as: PROTONIX Take 40 mg by mouth daily.   traZODone 50 MG tablet Commonly known as: DESYREL Take 1 tablet by mouth at bedtime.   VITAMIN B-1 PO Take 1 tablet by mouth daily.       Discharge Assessment: Vitals:   05/10/19 2132 05/11/19 0459  BP: (!) 111/54 114/62  Pulse: 86 89  Resp: 16 17  Temp: 98.5 F (36.9 C) 98.1 F (36.7 C)  SpO2: 95% 95%   Skin clean, dry and intact without evidence of skin break down, no evidence of skin tears noted. IV catheter discontinued intact. Site without signs and symptoms of complications - no redness or edema noted at insertion site, patient denies c/o pain - only slight tenderness at site.  Dressing with slight pressure  applied.  D/c Instructions-Education: Discharge instructions given to patient/family with verbalized understanding. D/c education completed with patient/family including follow up instructions, medication list, d/c activities limitations if indicated, with other d/c instructions as indicated by MD - patient able to verbalize understanding, all questions fully answered. Patient instructed to return to ED, call 911, or call MD for any changes in condition.  Patient escorted via [place], and D/C home via private auto.  Venita Sheffield, RN 05/11/2019 2:14 PM

## 2019-05-12 ENCOUNTER — Encounter (HOSPITAL_COMMUNITY): Payer: Self-pay | Admitting: Urology

## 2019-05-13 LAB — CULTURE, BLOOD (ROUTINE X 2)
Culture: NO GROWTH
Culture: NO GROWTH
Special Requests: ADEQUATE
Special Requests: ADEQUATE

## 2019-05-14 ENCOUNTER — Other Ambulatory Visit: Payer: Self-pay | Admitting: *Deleted

## 2019-05-14 NOTE — Patient Outreach (Signed)
[facility]) Care Management  05/14/2019  DEBI TACK 12-Jan-1946 [id number]    EMMI-GENERAL DSICHARGE  RED ON EMMI ALERT Day # 1 Date: 05/13/2019 Red Alert Reason: No scheduled follow up   Outreach attempt # 1 RN spoke with pt and verified [facility] services and the purpose for today's call. Pt verified she has an appointments and all has been taking care of with no additional issues. RN inquired on any other issues or needs to address at this time. Pt indicated no needs and appreciative for the call today. RN has informed pt that would may received another follow up call due to a 2nd call concerning her recent discharge.   Plan: RN CM will close this EMMI with no other needs to address at this time.  Raina Mina, RN Care Management Coordinator [place] Office [phone]

## 2019-05-27 ENCOUNTER — Encounter: Payer: Self-pay | Admitting: Gastroenterology

## 2019-05-28 ENCOUNTER — Ambulatory Visit (INDEPENDENT_AMBULATORY_CARE_PROVIDER_SITE_OTHER): Payer: Medicare HMO | Admitting: Urology

## 2019-05-28 DIAGNOSIS — N201 Calculus of ureter: Secondary | ICD-10-CM

## 2019-05-29 ENCOUNTER — Other Ambulatory Visit: Payer: Self-pay | Admitting: Urology

## 2019-05-30 DIAGNOSIS — E039 Hypothyroidism, unspecified: Secondary | ICD-10-CM | POA: Diagnosis not present

## 2019-05-30 DIAGNOSIS — I10 Essential (primary) hypertension: Secondary | ICD-10-CM | POA: Diagnosis not present

## 2019-05-30 DIAGNOSIS — F419 Anxiety disorder, unspecified: Secondary | ICD-10-CM | POA: Diagnosis not present

## 2019-05-30 DIAGNOSIS — K2100 Gastro-esophageal reflux disease with esophagitis, without bleeding: Secondary | ICD-10-CM | POA: Diagnosis not present

## 2019-06-09 ENCOUNTER — Other Ambulatory Visit: Payer: Self-pay

## 2019-06-09 ENCOUNTER — Other Ambulatory Visit (HOSPITAL_COMMUNITY)
Admission: RE | Admit: 2019-06-09 | Discharge: 2019-06-09 | Disposition: A | Discharge status: Home / Self Care | Payer: Medicare HMO | Source: Ambulatory Visit | Attending: Ophthalmology | Admitting: Ophthalmology

## 2019-06-09 ENCOUNTER — Encounter (HOSPITAL_COMMUNITY)
Admission: RE | Admit: 2019-06-09 | Discharge: 2019-06-09 | Disposition: A | Discharge status: Home / Self Care | Payer: Medicare HMO | Source: Ambulatory Visit | Attending: Urology | Admitting: Urology

## 2019-06-09 DIAGNOSIS — Z01812 Encounter for preprocedural laboratory examination: Secondary | ICD-10-CM | POA: Insufficient documentation

## 2019-06-09 DIAGNOSIS — Z20828 Contact with and (suspected) exposure to other viral communicable diseases: Secondary | ICD-10-CM | POA: Insufficient documentation

## 2019-06-09 LAB — NOVEL CORONAVIRUS, NAA: SARS-CoV-2, NAA: NOT DETECTED

## 2019-06-09 LAB — SARS CORONAVIRUS 2 (TAT 6-24 HRS): SARS Coronavirus 2: NEGATIVE

## 2019-06-09 NOTE — Patient Instructions (Addendum)
Your procedure is scheduled on: 06/11/2019  Report to [facility] at   11:15  AM.  Call this number if you have problems the morning of surgery: [phone]   Remember:   Do not Eat or Drink after midnight   :  Take these medicines the morning of surgery with A SIP OF WATER: Levothyroxine, Zofran and protonix   Do not wear jewelry, make-up or nail polish.  Do not wear lotions, powders, or perfumes. You may wear deodorant.  Do not shave 48 hours prior to surgery. Men may shave face and neck.  Do not bring valuables to the hospital.  Contacts, dentures or bridgework may not be worn into surgery.  Leave suitcase in the car. After surgery it may be brought to your room.  For patients admitted to the hospital, checkout time is 11:00 AM the day of discharge.   Patients discharged the day of surgery will not be allowed to drive home.    Cystoscopy Cystoscopy is a procedure that is used to help diagnose and sometimes treat conditions that affect the lower urinary tract. The lower urinary tract includes the bladder and the urethra. The urethra is the tube that drains urine from the bladder. Cystoscopy is done using a thin, tube-shaped instrument with a light and camera at the end (cystoscope). The cystoscope may be hard or flexible, depending on the goal of the procedure. The cystoscope is inserted through the urethra, into the bladder. Cystoscopy may be recommended if you have:  Urinary tract infections that keep coming back.  Blood in the urine (hematuria).  An inability to control when you urinate (urinary incontinence) or an overactive bladder.  Unusual cells found in a urine sample.  A blockage in the urethra, such as a urinary stone.  Painful urination.  An abnormality in the bladder found during an intravenous pyelogram (IVP) or CT scan. Cystoscopy may also be done to remove a sample of tissue to be examined under a microscope (biopsy). Tell a health care provider about:  Any  allergies you have.  All medicines you are taking, including vitamins, herbs, eye drops, creams, and over-the-counter medicines.  Any problems you or family members have had with anesthetic medicines.  Any blood disorders you have.  Any surgeries you have had.  Any medical conditions you have.  Whether you are pregnant or may be pregnant. What are the risks? Generally, this is a safe procedure. However, problems may occur, including:  Infection.  Bleeding.  Allergic reactions to medicines.  Damage to other structures or organs. What happens before the procedure?  Ask your health care provider about: ? Changing or stopping your regular medicines. This is especially important if you are taking diabetes medicines or blood thinners. ? Taking medicines such as aspirin and ibuprofen. These medicines can thin your blood. Do not take these medicines unless your health care provider tells you to take them. ? Taking over-the-counter medicines, vitamins, herbs, and supplements.  Follow instructions from your health care provider about eating or drinking restrictions.  Ask your health care provider what steps will be taken to help prevent infection. These may include: ? Washing skin with a germ-killing soap. ? Taking antibiotic medicine.  You may have an exam or testing, such as: ? X-rays of the bladder, urethra, or kidneys. ? Urine tests to check for signs of infection.  Plan to have someone take you home from the hospital or clinic. What happens during the procedure?   You will be given  one or more of the following: ? A medicine to help you relax (sedative). ? A medicine to numb the area (local anesthetic).  The area around the opening of your urethra will be cleaned.  The cystoscope will be passed through your urethra into your bladder.  Germ-free (sterile) fluid will flow through the cystoscope to fill your bladder. The fluid will stretch your bladder so that your health  care provider can clearly examine your bladder walls.  Your doctor will look at the urethra and bladder. Your doctor may take a biopsy or remove stones.  The cystoscope will be removed, and your bladder will be emptied. The procedure may vary among health care providers and hospitals. What can I expect after the procedure? After the procedure, it is common to have:  Some soreness or pain in your abdomen and urethra.  Urinary symptoms. These include: ? Mild pain or burning when you urinate. Pain should stop within a few minutes after you urinate. This may last for up to 1 week. ? A small amount of blood in your urine for several days. ? Feeling like you need to urinate but producing only a small amount of urine. Follow these instructions at home: Medicines  Take over-the-counter and prescription medicines only as told by your health care provider.  If you were prescribed an antibiotic medicine, take it as told by your health care provider. Do not stop taking the antibiotic even if you start to feel better. General instructions  Return to your normal activities as told by your health care provider. Ask your health care provider what activities are safe for you.  Do not drive for 24 hours if you were given a sedative during your procedure.  Watch for any blood in your urine. If the amount of blood in your urine increases, call your health care provider.  Follow instructions from your health care provider about eating or drinking restrictions.  If a tissue sample was removed for testing (biopsy) during your procedure, it is up to you to get your test results. Ask your health care provider, or the department that is doing the test, when your results will be ready.  Drink enough fluid to keep your urine pale yellow.  Keep all follow-up visits as told by your health care provider. This is important. Contact a health care provider if you:  Have pain that gets worse or does not get  better with medicine, especially pain when you urinate.  Have trouble urinating.  Have more blood in your urine. Get help right away if you:  Have blood clots in your urine.  Have abdominal pain.  Have a fever or chills.  Are unable to urinate. Summary  Cystoscopy is a procedure that is used to help diagnose and sometimes treat conditions that affect the lower urinary tract.  Cystoscopy is done using a thin, tube-shaped instrument with a light and camera at the end.  After the procedure, it is common to have some soreness or pain in your abdomen and urethra.  Watch for any blood in your urine. If the amount of blood in your urine increases, call your health care provider.  If you were prescribed an antibiotic medicine, take it as told by your health care provider. Do not stop taking the antibiotic even if you start to feel better. This information is not intended to replace advice given to you by your health care provider. Make sure you discuss any questions you have with your health care provider.  Document Released: 07/21/2000 Document Revised: 07/16/2018 Document Reviewed: 07/16/2018 Elsevier Patient Education  2020 [place] Anesthesia, Adult, Care After This sheet gives you information about how to care for yourself after your procedure. Your health care provider may also give you more specific instructions. If you have problems or questions, contact your health care provider. What can I expect after the procedure? After the procedure, the following side effects are common:  Pain or discomfort at the IV site.  Nausea.  Vomiting.  Sore throat.  Trouble concentrating.  Feeling cold or chills.  Weak or tired.  Sleepiness and fatigue.  Soreness and body aches. These side effects can affect parts of the body that were not involved in surgery. Follow these instructions at home:  For at least 24 hours after the procedure:  Have a responsible adult  stay with you. It is important to have someone help care for you until you are awake and alert.  Rest as needed.  Do not: ? Participate in activities in which you could fall or become injured. ? Drive. ? Use heavy machinery. ? Drink alcohol. ? Take sleeping pills or medicines that cause drowsiness. ? Make important decisions or sign legal documents. ? Take care of children on your own. Eating and drinking  Follow any instructions from your health care provider about eating or drinking restrictions.  When you feel hungry, start by eating small amounts of foods that are soft and easy to digest (bland), such as toast. Gradually return to your regular diet.  Drink enough fluid to keep your urine pale yellow.  If you vomit, rehydrate by drinking water, juice, or clear broth. General instructions  If you have sleep apnea, surgery and certain medicines can increase your risk for breathing problems. Follow instructions from your health care provider about wearing your sleep device: ? Anytime you are sleeping, including during daytime naps. ? While taking prescription pain medicines, sleeping medicines, or medicines that make you drowsy.  Return to your normal activities as told by your health care provider. Ask your health care provider what activities are safe for you.  Take over-the-counter and prescription medicines only as told by your health care provider.  If you smoke, do not smoke without supervision.  Keep all follow-up visits as told by your health care provider. This is important. Contact a health care provider if:  You have nausea or vomiting that does not get better with medicine.  You cannot eat or drink without vomiting.  You have pain that does not get better with medicine.  You are unable to pass urine.  You develop a skin rash.  You have a fever.  You have redness around your IV site that gets worse. Get help right away if:  You have difficulty breathing.   You have chest pain.  You have blood in your urine or stool, or you vomit blood. Summary  After the procedure, it is common to have a sore throat or nausea. It is also common to feel tired.  Have a responsible adult stay with you for the first 24 hours after general anesthesia. It is important to have someone help care for you until you are awake and alert.  When you feel hungry, start by eating small amounts of foods that are soft and easy to digest (bland), such as toast. Gradually return to your regular diet.  Drink enough fluid to keep your urine pale yellow.  Return to your normal activities as told by your  health care provider. Ask your health care provider what activities are safe for you. This information is not intended to replace advice given to you by your health care provider. Make sure you discuss any questions you have with your health care provider. Document Released: 10/30/2000 Document Revised: 07/27/2017 Document Reviewed: 03/09/2017 Elsevier Patient Education  2020 [employer].

## 2019-06-10 ENCOUNTER — Ambulatory Visit: Payer: Medicare HMO | Admitting: Gastroenterology

## 2019-06-11 ENCOUNTER — Ambulatory Visit (HOSPITAL_COMMUNITY): Payer: Medicare HMO

## 2019-06-11 ENCOUNTER — Encounter (HOSPITAL_COMMUNITY)
Admission: RE | Disposition: A | Discharge status: Home / Self Care | Payer: Self-pay | Source: Home / Self Care | Attending: Urology

## 2019-06-11 ENCOUNTER — Other Ambulatory Visit: Payer: Self-pay

## 2019-06-11 ENCOUNTER — Ambulatory Visit (HOSPITAL_COMMUNITY)
Admission: RE | Admit: 2019-06-11 | Discharge: 2019-06-11 | Disposition: A | Discharge status: Home / Self Care | Payer: Medicare HMO | Attending: Urology | Admitting: Urology

## 2019-06-11 ENCOUNTER — Encounter (HOSPITAL_COMMUNITY): Payer: Self-pay | Admitting: *Deleted

## 2019-06-11 ENCOUNTER — Ambulatory Visit (HOSPITAL_COMMUNITY): Payer: Medicare HMO | Admitting: Anesthesiology

## 2019-06-11 DIAGNOSIS — Z87442 Personal history of urinary calculi: Secondary | ICD-10-CM | POA: Diagnosis not present

## 2019-06-11 DIAGNOSIS — N201 Calculus of ureter: Secondary | ICD-10-CM | POA: Insufficient documentation

## 2019-06-11 DIAGNOSIS — Z466 Encounter for fitting and adjustment of urinary device: Secondary | ICD-10-CM | POA: Diagnosis not present

## 2019-06-11 HISTORY — PX: CYSTOSCOPY WITH URETEROSCOPY: SHX5123

## 2019-06-11 SURGERY — CYSTOSCOPY WITH URETEROSCOPY
Anesthesia: General | Site: Ureter | Laterality: Right

## 2019-06-11 MED ORDER — FENTANYL CITRATE (PF) 100 MCG/2ML IJ SOLN
INTRAMUSCULAR | Status: DC | PRN
  Administered 2019-06-11 (×3): 25 ug via INTRAVENOUS

## 2019-06-11 MED ORDER — PROMETHAZINE HCL 25 MG/ML IJ SOLN: 6.2500 mg | INTRAMUSCULAR | Status: DC | PRN

## 2019-06-11 MED ORDER — PROPOFOL 10 MG/ML IV BOLUS
INTRAVENOUS | Status: DC | PRN
  Administered 2019-06-11: 180 mg via INTRAVENOUS
  Administered 2019-06-11: 10 mg via INTRAVENOUS

## 2019-06-11 MED ORDER — MIDAZOLAM HCL 2 MG/2ML IJ SOLN: 0.5000 mg | Freq: Once | INTRAMUSCULAR | Status: DC | PRN

## 2019-06-11 MED ORDER — WATER FOR IRRIGATION, STERILE IR SOLN
Status: DC | PRN
  Administered 2019-06-11: 1000 mL

## 2019-06-11 MED ORDER — FENTANYL CITRATE (PF) 100 MCG/2ML IJ SOLN
INTRAMUSCULAR | Status: AC
  Filled 2019-06-11: qty 2

## 2019-06-11 MED ORDER — ONDANSETRON HCL 4 MG/2ML IJ SOLN
INTRAMUSCULAR | Status: DC | PRN
  Administered 2019-06-11: 4 mg via INTRAVENOUS

## 2019-06-11 MED ORDER — LACTATED RINGERS IV SOLN
INTRAVENOUS | Status: DC
  Administered 2019-06-11: 1000 mL via INTRAVENOUS

## 2019-06-11 MED ORDER — SODIUM CHLORIDE 0.9 % IR SOLN
Status: DC | PRN
  Administered 2019-06-11: 3000 mL

## 2019-06-11 MED ORDER — DIATRIZOATE MEGLUMINE 30 % UR SOLN
URETHRAL | Status: AC
  Filled 2019-06-11: qty 100

## 2019-06-11 MED ORDER — HYDROCODONE-ACETAMINOPHEN 5-325 MG PO TABS: 1.0000 | ORAL_TABLET | Freq: Four times a day (QID) | ORAL | 0 refills | Status: DC | PRN

## 2019-06-11 MED ORDER — SODIUM CHLORIDE 0.9 % IV SOLN
2.0000 g | INTRAVENOUS | Status: AC
  Administered 2019-06-11: 2 g via INTRAVENOUS
  Filled 2019-06-11: qty 20

## 2019-06-11 MED ORDER — ONDANSETRON HCL 4 MG/2ML IJ SOLN
INTRAMUSCULAR | Status: AC
  Filled 2019-06-11: qty 2

## 2019-06-11 SURGICAL SUPPLY — 25 items
BAG DRAIN URO TABLE W/ADPT NS (BAG) ×3 IMPLANT
BAG DRN 8 ADPR NS SKTRN CSTL (BAG) ×2
CATH INTERMIT  6FR 70CM (CATHETERS) ×3 IMPLANT
CLOTH BEACON ORANGE TIMEOUT ST (SAFETY) ×3 IMPLANT
DECANTER SPIKE VIAL GLASS SM (MISCELLANEOUS) ×3 IMPLANT
EXTRACTOR STONE NITINOL NGAGE (UROLOGICAL SUPPLIES) ×3 IMPLANT
FIBER LASER FLEXIVA 200 (UROLOGICAL SUPPLIES) ×3 IMPLANT
GLOVE BIO SURGEON STRL SZ7 (GLOVE) ×1 IMPLANT
GLOVE BIO SURGEON STRL SZ8 (GLOVE) ×3 IMPLANT
GLOVE BIOGEL PI IND STRL 7.0 (GLOVE) ×4 IMPLANT
GLOVE BIOGEL PI INDICATOR 7.0 (GLOVE) ×2
GOWN STRL REUS W/ TWL XL LVL3 (GOWN DISPOSABLE) ×2 IMPLANT
GOWN STRL REUS W/TWL LRG LVL3 (GOWN DISPOSABLE) ×3 IMPLANT
GOWN STRL REUS W/TWL XL LVL3 (GOWN DISPOSABLE) ×3
GUIDEWIRE STR DUAL SENSOR (WIRE) ×3 IMPLANT
GUIDEWIRE STR ZIPWIRE 035X150 (MISCELLANEOUS) ×3 IMPLANT
IV NS IRRIG 3000ML ARTHROMATIC (IV SOLUTION) ×6 IMPLANT
KIT TURNOVER CYSTO (KITS) ×3 IMPLANT
MANIFOLD NEPTUNE II (INSTRUMENTS) ×3 IMPLANT
PACK CYSTO (CUSTOM PROCEDURE TRAY) ×3 IMPLANT
PAD ARMBOARD 7.5X6 YLW CONV (MISCELLANEOUS) ×3 IMPLANT
STENT URET 6FRX26 CONTOUR (STENTS) IMPLANT
SYR 10ML LL (SYRINGE) ×3 IMPLANT
TOWEL OR 17X26 4PK STRL BLUE (TOWEL DISPOSABLE) ×3 IMPLANT
WATER STERILE IRR 500ML POUR (IV SOLUTION) ×3 IMPLANT

## 2019-06-11 NOTE — Anesthesia Preprocedure Evaluation (Addendum)
Anesthesia Evaluation  Patient identified by MRN, date of birth, ID band Patient awake    Reviewed: Allergy & Precautions, NPO status , Patient's Chart, lab work & pertinent test results  Airway Mallampati: III  TM Distance: >3 FB Neck ROM: Full    Dental no notable dental hx. (+) Teeth Intact   Pulmonary neg pulmonary ROS,    Pulmonary exam normal breath sounds clear to auscultation       Cardiovascular Exercise Tolerance: Good negative cardio ROS Normal cardiovascular examI Rhythm:Regular Rate:Normal     Neuro/Psych Anxiety negative neurological ROS  negative psych ROS   GI/Hepatic negative GI ROS, Neg liver ROS,   Endo/Other  Hypothyroidism   Renal/GU Renal InsufficiencyRenal diseaseHere for stent removal per pt   negative genitourinary   Musculoskeletal negative musculoskeletal ROS (+)   Abdominal   Peds negative pediatric ROS (+)  Hematology negative hematology ROS (+) anemia ,   Anesthesia Other Findings   Reproductive/Obstetrics negative OB ROS                             Anesthesia Physical Anesthesia Plan  ASA: II  Anesthesia Plan: General   Post-op Pain Management:    Induction: Intravenous  PONV Risk Score and Plan: 3 and TIVA, Propofol infusion, Treatment may vary due to age or medical condition and Ondansetron  Airway Management Planned: Simple Face Mask and Nasal Cannula  Additional Equipment:   Intra-op Plan:   Post-operative Plan: Extubation in OR  Informed Consent: I have reviewed the patients History and Physical, chart, labs and discussed the procedure including the risks, benefits and alternatives for the proposed anesthesia with the patient or authorized representative who has indicated his/her understanding and acceptance.     Dental advisory given  Plan Discussed with: CRNA  Anesthesia Plan Comments: (Plan Full PPE use Plan GA(LMA) vs.  GETA as  needed D/W PT -WTP with same after Q&A GA requested by Dr. Jerilynn Mages.)       Anesthesia Quick Evaluation

## 2019-06-11 NOTE — Discharge Instructions (Signed)
Ureteroscopy, Care After This sheet gives you information about how to care for yourself after your procedure. Your health care provider may also give you more specific instructions. If you have problems or questions, contact your health care provider. What can I expect after the procedure? After the procedure, it is common to have:  A burning sensation when you urinate.  Blood in your urine.  Mild discomfort in the bladder area or kidney area when urinating.  Needing to urinate more often or urgently. Follow these instructions at home:  Medicines  Take over-the-counter and prescription medicines only as told by your health care provider.  If you were prescribed an antibiotic medicine, take it as told by your health care provider. Do not stop taking the antibiotic even if you start to feel better. General instructions  Donot drive for 24 hours if you were given a medicine to help you relax (sedative) during your procedure.  To relieve burning, try taking a warm bath or holding a warm washcloth over your groin.  Drink enough fluid to keep your urine clear or pale yellow. ? Drink two 8-ounce glasses of water every hour for the first 2 hours after you get home. ? Continue to drink water often at home.  You can eat what you usually do.  Keep all follow-up visits as told by your health care provider. This is important. ? If you had a tube placed to keep urine flowing (ureteral stent), ask your health care provider when you need to return to have it removed. Contact a health care provider if:  You have chills or a fever.  You have burning pain for longer than 24 hours after the procedure.  You have blood in your urine for longer than 24 hours after the procedure. Get help right away if:  You have large amounts of blood in your urine.  You have blood clots in your urine.  You have very bad pain.  You have chest pain or trouble breathing.  You are unable to urinate and you  have the feeling of a full bladder. This information is not intended to replace advice given to you by your health care provider. Make sure you discuss any questions you have with your health care provider. Document Released: 07/29/2013 Document Revised: 07/06/2017 Document Reviewed: 05/05/2016 Elsevier Patient Education  2020 [employer].

## 2019-06-11 NOTE — Anesthesia Procedure Notes (Signed)
Procedure Name: LMA Insertion Date/Time: 06/11/2019 1:17 PM Performed by: Vista Deck, CRNA Pre-anesthesia Checklist: Patient identified, Patient being monitored, Emergency Drugs available, Timeout performed and Suction available Patient Re-evaluated:Patient Re-evaluated prior to induction Oxygen Delivery Method: Circle System Utilized Preoxygenation: Pre-oxygenation with 100% oxygen Induction Type: IV induction Ventilation: Mask ventilation without difficulty LMA: LMA inserted LMA Size: 4.0 Number of attempts: 1 Placement Confirmation: positive ETCO2 and breath sounds checked- equal and bilateral Tube secured with: Tape Dental Injury: Teeth and Oropharynx as per pre-operative assessment

## 2019-06-11 NOTE — Op Note (Signed)
Preoperative diagnosis: Right ureteral calculus  Postoperative diagnosis: no calculus visualized  Procedure: 1 cystoscopy 2.  right retrograde pyelography 3.  Intraoperative fluoroscopy, under one hour, with interpretation 4.  Right diagnostic ureteroscopy  Attending: Rosie Fate  Anesthesia: General  Estimated blood loss: None  Drains: none  Specimens: none  Antibiotics: rocephin  Findings: no hydronephrosis. No stone found in the ureter or the kidney.  Indications: Patient is a 73 year old female with a history of right ureteral calculus who underwent stent placement for sepsis.  After discussing treatment options, she decided proceed with right ureteroscopic stone extraction  Procedure her in detail: The patient was brought to the operating room and a brief timeout was done to ensure correct patient, correct procedure, correct site.  General anesthesia was administered patient was placed in dorsal lithotomy position.  Her genitalia was then prepped and draped in usual sterile fashion.  A rigid 90 French cystoscope was passed in the urethra and the bladder.  Bladder was inspected free masses or lesions.  the ureteral orifices were in the normal orthotopic locations. Using a grasper the ureteral stent was brought to the urethral meatus. A zipwire was advanced through the stent and up to the renal pelvis. The stent was then removed.  a 6 french ureteral catheter was then instilled into the right ureter orifice.  a gentle retrograde was obtained and findings noted above. we then removed the cystoscope and cannulated the right ureteral orifice with a semirigid ureteroscope.  we then performed ureteroscopy up to the level of the UPJ. No stone or tumor was encountered. Once we reached the UPJ a sensor wire was advanced into the renal pelvis. The scope was then removed and a flexible ureteroscope was advanced over the sensor wire and up to the renal pelvis. We perform nephroscopy and no  stone was ecountered. We then removed the scope.  the bladder was then drained and this concluded the procedure which was well tolerated by patient.  Complications: None  Condition: Stable, extubated, transferred to PACU  Plan: Pt is to followup in 2 weeks

## 2019-06-11 NOTE — Anesthesia Postprocedure Evaluation (Signed)
Anesthesia Post Note  Patient: Rhonda Bailey  Procedure(s) Performed: CYSTOSCOPY WITH DIAGNOSTIC URETEROSCOPY; RIGHT URETERAL STENT REMOVAL (Right Ureter)  Patient location during evaluation: PACU Anesthesia Type: General Level of consciousness: awake and alert and patient cooperative Pain management: satisfactory to patient Respiratory status: spontaneous breathing Cardiovascular status: stable Postop Assessment: no apparent nausea or vomiting Anesthetic complications: no     Last Vitals:  Vitals:   06/11/19 1354 06/11/19 1400  BP: (!) 171/81 (!) 162/87  Pulse: 82 79  Resp: 11 17  Temp: (!) 36.3 C   SpO2: 97% 94%    Last Pain:  Vitals:   06/11/19 1354  TempSrc:   PainSc: 0-No pain                 Kalynne Womac

## 2019-06-11 NOTE — Transfer of Care (Signed)
Immediate Anesthesia Transfer of Care Note  Patient: Rhonda Bailey  Procedure(s) Performed: CYSTOSCOPY WITH DIAGNOSTIC URETEROSCOPY; ureteral stent removal (Right Ureter)  Patient Location: PACU  Anesthesia Type:General  Level of Consciousness: awake, alert  and patient cooperative  Airway & Oxygen Therapy: Patient Spontanous Breathing  Post-op Assessment: Report given to RN and Post -op Vital signs reviewed and stable  Post vital signs: Reviewed and stable  Last Vitals:  Vitals Value Taken Time  BP 171/81 06/11/19 1354  Temp    Pulse 83 06/11/19 1358  Resp 16 06/11/19 1358  SpO2 99 % 06/11/19 1358  Vitals shown include unvalidated device data.  Last Pain:  Vitals:   06/11/19 1147  TempSrc: Oral  PainSc: 0-No pain      Patients Stated Pain Goal: 7 ([id number])  Complications: No apparent anesthesia complications

## 2019-06-11 NOTE — H&P (Signed)
Urology Admission H&P  Chief Complaint: right ureteral calculus  History of Present Illness: Rhonda Bailey is a 73yo with a hx of right ureteral calculus and sepsis who underwent right ureteral stent placement. She presents today for right ureteral stone extraction. MIld LUTS with stent in place. No flank pain  Past Medical History:  Diagnosis Date  . Anxiety   . Hypothyroidism    Past Surgical History:  Procedure Laterality Date  . BIOPSY  09/02/2018   Procedure: BIOPSY;  Surgeon: Daneil Dolin, MD;  [facility];  Service: Endoscopy;;  colon   . CHOLECYSTECTOMY  05/19/2011   Procedure: LAPAROSCOPIC CHOLECYSTECTOMY;  Surgeon: Jamesetta So;  Location: [facility];  Service: General;  Laterality: N/A;  . COLONOSCOPY WITH PROPOFOL N/A 09/02/2018   Dr. Gala Romney: Proctocolitis limited to the left side with some exudate versus partial pseudomembrane formation, erosions and ulceration somewhat superficial.  Terminal ileum normal.  Diverticulosis.  Descending/sigmoid colon biopsy showing severe active colitis, diagnostic features of IBD not identified.  Differential includes infection, ischemia.  Rectal biopsies with granulation tissue c/w ulcer  . CYSTOSCOPY W/ URETERAL STENT PLACEMENT Right 05/08/2019   Procedure: CYSTOSCOPY WITH RETROGRADE PYELOGRAM/URETERAL STENT PLACEMENT;  Surgeon: Festus Aloe, MD;  Location: [facility];  Service: Urology;  Laterality: Right;  . TUBAL LIGATION      Home Medications:  Current Facility-Administered Medications  Medication Dose Route Frequency Provider Last Rate Last Dose  . cefTRIAXone (ROCEPHIN) 2 g in sodium chloride 0.9 % 100 mL IVPB  2 g Intravenous 30 min Pre-Op Francesco Provencal, Candee Furbish, MD      . lactated ringers infusion   Intravenous Continuous Lenice Llamas, MD 50 mL/hr at 06/11/19 1158 1,000 mL at 06/11/19 1158   Allergies:  Allergies  Allergen Reactions  . Tramadol     Says "blew my head up."    Family History  Problem Relation Age of  Onset  . Anesthesia problems Neg Hx   . Hypotension Neg Hx   . Malignant hyperthermia Neg Hx   . Pseudochol deficiency Neg Hx    Social History:  reports that she has never smoked. She has never used smokeless tobacco. She reports that she does not drink alcohol or use drugs.  Review of Systems  All other systems reviewed and are negative.   Physical Exam:  Vital signs in last 24 hours: Temp:  [97.8 F (36.6 C)] 97.8 F (36.6 C) (11/04 1147) Resp:  [18] 18 (11/04 1147) BP: (159)/(69) 159/69 (11/04 1147) SpO2:  [97 %] 97 % (11/04 1147) Weight:  [81.6 kg] 81.6 kg (11/04 1147) Physical Exam  Constitutional: She is oriented to person, place, and time. She appears well-developed and well-nourished.  HENT:  Head: Normocephalic and atraumatic.  Eyes: Pupils are equal, round, and reactive to light. EOM are normal.  Neck: Normal range of motion. No thyromegaly present.  Cardiovascular: Normal rate and regular rhythm.  Respiratory: Effort normal. No respiratory distress.  GI: Soft. She exhibits no distension.  Musculoskeletal: Normal range of motion.        General: No edema.  Neurological: She is alert and oriented to person, place, and time.  Skin: Skin is warm and dry.  Psychiatric: She has a normal mood and affect. Her behavior is normal. Judgment and thought content normal.    Laboratory Data:  No results found for this or any previous visit (from the past 24 hour(s)). Recent Results (from the past 240 hour(s))  SARS CORONAVIRUS 2 (TAT 6-24  HRS) Nasopharyngeal Nasopharyngeal Swab     Status: None   Collection Time: 06/09/19  6:56 AM   Specimen: Nasopharyngeal Swab  Result Value Ref Range Status   SARS Coronavirus 2 NEGATIVE NEGATIVE Final    Comment: (NOTE) SARS-CoV-2 target nucleic acids are NOT DETECTED. The SARS-CoV-2 RNA is generally detectable in upper and lower respiratory specimens during the acute phase of infection. Negative results do not preclude SARS-CoV-2  infection, do not rule out co-infections with other pathogens, and should not be used as the sole basis for treatment or other patient management decisions. Negative results must be combined with clinical observations, patient history, and epidemiological information. The expected result is Negative. Fact Sheet for Patients: SugarRoll.be Fact Sheet for Healthcare Providers: https://www.woods-mathews.com/ This test is not yet approved or cleared by the [place] FDA and  has been authorized for detection and/or diagnosis of SARS-CoV-2 by FDA under an Emergency Use Authorization (EUA). This EUA will remain  in effect (meaning this test can be used) for the duration of the COVID-19 declaration under Section 56 4(b)(1) of the Act, 21 U.S.C. section 360bbb-3(b)(1), unless the authorization is terminated or revoked sooner. Performed at [facility], [place] [address] [place], [place] [address]    Creatinine: No results for input(s): CREATININE in the last 168 hours. Baseline Creatinine: unknown  Impression/Assessment:  73yo with right ureteral calculus  Plan:  The risks/benefits/alternatives to right ureteroscopic stone extraction was explained to the patient and she understands and wishes to proceed with surgery  Nicolette Bang 06/11/2019, 11:59 AM

## 2019-06-12 ENCOUNTER — Encounter (HOSPITAL_COMMUNITY): Payer: Self-pay | Admitting: Urology

## 2019-06-18 ENCOUNTER — Ambulatory Visit (INDEPENDENT_AMBULATORY_CARE_PROVIDER_SITE_OTHER): Payer: Medicare HMO | Admitting: Urology

## 2019-06-18 DIAGNOSIS — N201 Calculus of ureter: Secondary | ICD-10-CM | POA: Diagnosis not present

## 2019-06-20 ENCOUNTER — Other Ambulatory Visit (HOSPITAL_COMMUNITY): Payer: Self-pay | Admitting: Urology

## 2019-06-20 ENCOUNTER — Other Ambulatory Visit: Payer: Self-pay | Admitting: Urology

## 2019-06-20 DIAGNOSIS — N201 Calculus of ureter: Secondary | ICD-10-CM

## 2019-06-23 ENCOUNTER — Telehealth: Payer: Self-pay | Admitting: Gastroenterology

## 2019-06-23 DIAGNOSIS — R600 Localized edema: Secondary | ICD-10-CM | POA: Diagnosis not present

## 2019-06-23 DIAGNOSIS — R1314 Dysphagia, pharyngoesophageal phase: Secondary | ICD-10-CM | POA: Diagnosis not present

## 2019-06-23 DIAGNOSIS — R220 Localized swelling, mass and lump, head: Secondary | ICD-10-CM | POA: Diagnosis not present

## 2019-06-23 DIAGNOSIS — H9113 Presbycusis, bilateral: Secondary | ICD-10-CM | POA: Diagnosis not present

## 2019-06-23 NOTE — Telephone Encounter (Signed)
Mahala Menghini, PA-C Note   1. Please request recent labs from PCP. 2. Please NIC for CT abdomen adrenal protocol, 08/2019.  Diagnosis left adrenal nodule.      Per 6/16 phone note. Please NIC for January thanks

## 2019-06-23 NOTE — Telephone Encounter (Signed)
December RECALL FOR CT

## 2019-06-24 ENCOUNTER — Telehealth: Payer: Self-pay | Admitting: Gastroenterology

## 2019-06-24 NOTE — Telephone Encounter (Signed)
Patient January  recall for ct

## 2019-06-24 NOTE — Telephone Encounter (Signed)
Recall mailed 

## 2019-06-26 ENCOUNTER — Ambulatory Visit (HOSPITAL_COMMUNITY): Payer: Medicare HMO

## 2019-07-05 DIAGNOSIS — K2100 Gastro-esophageal reflux disease with esophagitis, without bleeding: Secondary | ICD-10-CM | POA: Insufficient documentation

## 2019-07-05 DIAGNOSIS — F419 Anxiety disorder, unspecified: Secondary | ICD-10-CM | POA: Insufficient documentation

## 2019-07-05 DIAGNOSIS — I10 Essential (primary) hypertension: Secondary | ICD-10-CM | POA: Insufficient documentation

## 2019-07-05 DIAGNOSIS — K529 Noninfective gastroenteritis and colitis, unspecified: Secondary | ICD-10-CM | POA: Insufficient documentation

## 2019-07-29 ENCOUNTER — Other Ambulatory Visit: Payer: Self-pay

## 2019-07-29 ENCOUNTER — Ambulatory Visit: Payer: Medicare HMO | Admitting: Gastroenterology

## 2019-07-29 ENCOUNTER — Encounter: Payer: Self-pay | Admitting: Gastroenterology

## 2019-07-29 DIAGNOSIS — K649 Unspecified hemorrhoids: Secondary | ICD-10-CM | POA: Diagnosis not present

## 2019-07-29 MED ORDER — HYDROCORTISONE (PERIANAL) 2.5 % EX CREA: 1.0000 "application " | TOPICAL_CREAM | Freq: Two times a day (BID) | CUTANEOUS | 1 refills | Status: DC

## 2019-07-29 MED ORDER — BUDESONIDE 3 MG PO CPEP: 9.0000 mg | ORAL_CAPSULE | Freq: Every day | ORAL | 1 refills | Status: AC

## 2019-07-29 NOTE — Progress Notes (Signed)
Referring Provider: Sinda Du, MD Primary Care Physician:  Sinda Du, MD  Primary GI: Dr. Oneida Alar   Chief Complaint  Patient presents with  . Hemorrhoids    painful, bleeding, having BM makes worse    HPI:   Rhonda Bailey is a 73 y.o. female presenting today with a history of hospitalization in January for severe diarrhea.  C. difficile quick scan negative x2, GI pathogen panel negative.  Had reported a positive fecal DNA test as an outpatient.  She underwent an ileocolonoscopy which showed normal terminal ileum, left-sided proctocolitis with some exudate versus partial pseudomembrane formation/erosion and ulceration.  Pathology was consistent with infectious etiology versus ischemia.  No features consistent with IBD.  Given negative stool studies and lack of response to antibiotic therapy, there was concern for early IBD clinically.  She received a short course of Entocort with improvement in symptoms. Due to dysphagia at time of last appt, BPE ordered that was normal. Mild age related dysmotility.   CT imaging January 2020, 2 scans, with indeterminate left adrenal nodule with recommendation for follow-up adrenal protocol CT in 12 months, left-sided colitis.  She has known history of hemorrhoids that were excoriated appearing in Jan 2020 at time of colonoscopy. Presents today with flare for the past week. Notes significant burning. If passes gas, will have pain. For the past week, she has had multiple, frequent "mush-like" stool but NO diarrhea. Denying watery diarrhea. Sometimes very small formed stool. Using bathroom 4-5 times per day. Stool leakage. Using hemorrhoid wipes, sitz baths. Notes almost 25-30 small pieces of stool. Took Imodium today. Starts up at 2am. No fever or chills.     Past Medical History:  Diagnosis Date  . Anxiety   . Anxiety disorder, unspecified   . Essential (primary) hypertension   . Gastro-esophageal reflux disease with esophagitis   .  Hypokalemia   . Hypothyroidism   . Noninfective gastroenteritis and colitis, unspecified     Past Surgical History:  Procedure Laterality Date  . BIOPSY  09/02/2018   Procedure: BIOPSY;  Surgeon: Daneil Dolin, MD;  [facility];  Service: Endoscopy;;  colon   . CHOLECYSTECTOMY  05/19/2011   Procedure: LAPAROSCOPIC CHOLECYSTECTOMY;  Surgeon: Jamesetta So;  Location[facility];  Service: General;  Laterality: N/A;  . COLONOSCOPY WITH PROPOFOL N/A 09/02/2018   Dr. Gala Romney: Proctocolitis limited to the left side with some exudate versus partial pseudomembrane formation, erosions and ulceration somewhat superficial.  Terminal ileum normal.  Diverticulosis.  Descending/sigmoid colon biopsy showing severe active colitis, diagnostic features of IBD not identified.  Differential includes infection, ischemia.  Rectal biopsies with granulation tissue c/w ulcer  . CYSTOSCOPY W/ URETERAL STENT PLACEMENT Right 05/08/2019   Procedure: CYSTOSCOPY WITH RETROGRADE PYELOGRAM/URETERAL STENT PLACEMENT;  Surgeon: Festus Aloe, MD;  Location[facility];  Service: Urology;  Laterality: Right;  . CYSTOSCOPY WITH URETEROSCOPY Right 06/11/2019   Procedure: CYSTOSCOPY WITH DIAGNOSTIC URETEROSCOPY; URETERAL STENT REMOVAL;  Surgeon: Cleon Gustin, MD;  Location: [facility];  Service: Urology;  Laterality: Right;  . TUBAL LIGATION      Current Outpatient Medications  Medication Sig Dispense Refill  . acidophilus (RISAQUAD) CAPS capsule Take 1 capsule by mouth daily.    Marland Kitchen ALPRAZolam (XANAX) 0.5 MG tablet Take 0.5 mg by mouth at bedtime as needed for sleep.     Marland Kitchen HYDROcodone-acetaminophen (NORCO) 5-325 MG tablet Take 1 tablet by mouth every 6 (six) hours as needed for moderate pain. 30 tablet  0  . ibuprofen (ADVIL) 200 MG tablet Take 200-400 mg by mouth every 6 (six) hours as needed for moderate pain.    Marland Kitchen levothyroxine (SYNTHROID, LEVOTHROID) 25 MCG tablet Take 25 mcg by mouth daily before breakfast.     .  pantoprazole (PROTONIX) 40 MG tablet Take 40 mg by mouth daily.     . traZODone (DESYREL) 50 MG tablet Take 50 mg by mouth at bedtime as needed for sleep.     . budesonide (ENTOCORT EC) 3 MG 24 hr capsule Take 3 capsules (9 mg total) by mouth daily. 90 capsule 1  . hydrocortisone (ANUSOL-HC) 2.5 % rectal cream Place 1 application rectally 2 (two) times daily. [address] [facility] HEMORRHOID CREAM 30 g 1  . ondansetron (ZOFRAN ODT) 8 MG disintegrating tablet Take 1 tablet (8 mg total) by mouth every 8 (eight) hours as needed for nausea or vomiting. (Patient not taking: Reported on 07/29/2019) 20 tablet 0   No current facility-administered medications for this visit.    Allergies as of 07/29/2019 - Review Complete 07/29/2019  Allergen Reaction Noted  . Tramadol  05/08/2019    Family History  Problem Relation Age of Onset  . Anesthesia problems Neg Hx   . Hypotension Neg Hx   . Malignant hyperthermia Neg Hx   . Pseudochol deficiency Neg Hx     Social History   Socioeconomic History  . Marital status: Divorced    Spouse name: Not on file  . Number of children: Not on file  . Years of education: Not on file  . Highest education level: Not on file  Occupational History  . Not on file  Tobacco Use  . Smoking status: Never Smoker  . Smokeless tobacco: Never Used  Substance and Sexual Activity  . Alcohol use: No  . Drug use: No  . Sexual activity: Not on file  Other Topics Concern  . Not on file  Social History Narrative  . Not on file   Social Determinants of Health   Financial Resource Strain: Low Risk   . Difficulty of Paying Living Expenses: Not hard at all  Food Insecurity: No Food Insecurity  . Worried About Charity fundraiser in the Last Year: Never true  . Ran Out of Food in the Last Year: Never true  Transportation Needs: No Transportation Needs  . Lack of Transportation (Medical): No  . Lack of Transportation (Non-Medical): No  Physical Activity: Sufficiently  Active  . Days of Exercise per Week: 5 days  . Minutes of Exercise per Session: 30 min  Stress: No Stress Concern Present  . Feeling of Stress : Not at all  Social Connections: Moderately Isolated  . Frequency of Communication with Friends and Family: More than three times a week  . Frequency of Social Gatherings with Friends and Family: More than three times a week  . Attends Religious Services: Never  . Active Member of Clubs or Organizations: No  . Attends Archivist Meetings: Never  . Marital Status: Divorced    Review of Systems: As mentioned in HPI  Physical Exam: BP 123/75   Pulse 88   Temp (!) 97.3 F (36.3 C) (Oral)   Ht 5\' 8"  (1.727 m)   Wt 174 lb 9.6 oz (79.2 kg)   BMI 26.55 kg/m  General:   Alert and oriented. No distress noted. Pleasant and cooperative.  Head:  Normocephalic and atraumatic. Eyes:  Conjuctiva clear without scleral icterus. Rectal: several enlarged  hemorrhoids, does not  appear acutely thrombosed. Soft but painful to touch. Unable to perform internal exam due to discomfort. Fecal soiling at rectum.  Msk:  Symmetrical without gross deformities. Normal posture. Extremities:  Without edema. Neurologic:  Alert and  oriented x4 Psych:  Alert and cooperative. Normal mood and affect.  ASSESSMENT/Plan Rhonda Bailey is a 73 y.o. female presenting today with symptomatic hemorrhoids in setting of frequent stool. As noted above, she has a history of indeterminate left-sided proctocolitis with some concern for evolving IBD, s/p colonoscopy during hospitalization for refractory diarrhea earlier this year. It must be noted that she was negative for Cdiff on multiple occasions and did not respond to antibiotics while inpatient. She did, however, note considerable improvement on Entocort earlier this year. After much questioning, she denies watery diarrhea and describing more frequent, mush-like stool. Will trial course of Entocort now and refer to surgery  due to symptomatic hemorrhoids. This was also noted in Jan 2020 and severe at that time. If she were to develop diarrhea, she will need stool studies. I have called in [facility] cream in the interim. Surgical referral requested. Return in 6 weeks.   Annitta Needs, PhD, ANP-BC [facility]

## 2019-07-29 NOTE — Patient Instructions (Addendum)
We are referring you to the surgeon for help with your hemorrhoids.  I have called in a cream to [facility] that they specially compound to help with hemorrhoids. Use this 2-3 times per day.   I sent in Entocort to start taking. Take 3 capsules each day for the next 4 weeks, then we will wean you down. PLEASE CALL IF YOU START HAVING WATERY STOOL. If you have watery stool, we will need to check stool samples.  We will see you in 6 weeks!  I enjoyed seeing you again today! As you know, I value our relationship and want to provide genuine, compassionate, and quality care. I welcome your feedback. If you receive a survey regarding your visit,  I greatly appreciate you taking time to fill this out. See you next time!  Annitta Needs, PhD, ANP-BC [facility]

## 2019-08-05 ENCOUNTER — Other Ambulatory Visit: Payer: Self-pay

## 2019-08-05 DIAGNOSIS — N200 Calculus of kidney: Secondary | ICD-10-CM

## 2019-08-06 ENCOUNTER — Encounter (HOSPITAL_COMMUNITY): Payer: Self-pay

## 2019-08-06 ENCOUNTER — Ambulatory Visit: Payer: Medicare HMO | Admitting: Urology

## 2019-08-06 ENCOUNTER — Ambulatory Visit (HOSPITAL_COMMUNITY): Payer: Medicare HMO

## 2019-08-11 ENCOUNTER — Telehealth: Payer: Self-pay | Admitting: Internal Medicine

## 2019-08-11 NOTE — Telephone Encounter (Signed)
Pt said her medicine wasn't helping and it was burning her hemorrhoid. Please advise. [phone]

## 2019-08-11 NOTE — Telephone Encounter (Signed)
Pt is using the cream prescribed at [facility] and is taking Entocort 9 mg daily with no relief. Pt's stool is formed and can be soft/diarrhea form. Pt states stool isn't watery. Does pt need to submit stool studies? Please advise.

## 2019-08-11 NOTE — Telephone Encounter (Signed)
Spoke with Rhonda Bailey and her daughter. Rhonda Bailey was seen last 07/29/2019 and was prescribed Entocort 9 mg daily and HC 3.5% cr for rectum. Rhonda Bailey is having 10 or more bowel movements a day. Rhonda Bailey has a mix between formed and diarrhea daily. Rhonda Bailey states her rectum hurts and her and her hemorrhoids burn badly. Rhonda Bailey has an upcoming appointment to see the surgeon about removal of hemorrhoids and doesn't know what to do. Rhonda Bailey feels that the [facility] 2.5% rectal cream isn't help her. Rhonda Bailey's daughter says that her mother doesn't have much of an appetite and beginning to feel weak. Rhonda Bailey isn't having any nausea or vomiting and is drinking fluids daily. Pts daughter would like someone to discuss her mother with RMR since he was the doctor that saw her at the hospital last year.

## 2019-08-11 NOTE — Telephone Encounter (Signed)
I sent in medication to [facility]. Did she pick this up? Is she taking the entocort?   Difficult to sort out bowel habits. If any watery stool, let's go ahead and check Cdiff as per plan. Would add GI pathogen panel as well.   Appt with Dr. Arnoldo Morale is this week. She is actually an [facility] patient. Dr. Gala Romney performed procedures while inpatient.

## 2019-08-12 NOTE — Telephone Encounter (Signed)
   Some formed stool, some little balls. Stool leaks out at times. NO Diarrhea. I asked her multiple times regarding watery stool, which she denies. Significant hemorrhoids. If sits down, stool does not ease out. States she is somewhat better with entocort. I recommended adding supplemental fiber as a bulking agent, which she does not want to do. She desires to wait until she sees Dr. Arnoldo Morale. No role for stool studies as she does not have diarrhea. I have asked her to call us after consultation with Dr. Arnoldo Morale.

## 2019-08-14 ENCOUNTER — Other Ambulatory Visit: Payer: Self-pay

## 2019-08-14 ENCOUNTER — Encounter: Payer: Self-pay | Admitting: General Surgery

## 2019-08-14 ENCOUNTER — Ambulatory Visit: Payer: Medicare HMO | Admitting: General Surgery

## 2019-08-14 ENCOUNTER — Telehealth: Payer: Self-pay | Admitting: Gastroenterology

## 2019-08-14 VITALS — BP 127/72 | HR 79 | Temp 97.8°F | Resp 16 | Ht 68.0 in | Wt 167.0 lb

## 2019-08-14 DIAGNOSIS — K648 Other hemorrhoids: Secondary | ICD-10-CM

## 2019-08-14 NOTE — Patient Instructions (Signed)

## 2019-08-14 NOTE — Telephone Encounter (Signed)
When I am back in the office I will review the patient's records and finalize decision. Thanks. GM

## 2019-08-14 NOTE — Telephone Encounter (Signed)
Dr. [name],   This pt has been seen at [facility] would like to transfer care. You have come highly recommend and they would like to transfer care to you.Records from [facility] are in Epic I have printed the past year worth of records and  I will place records on your desk for your review.  Will you accept this pt?

## 2019-08-15 ENCOUNTER — Encounter: Payer: Self-pay | Admitting: Gastroenterology

## 2019-08-15 NOTE — H&P (Signed)
Rhonda Bailey; [id number]; Apr 16, 1946   HPI Patient is a 74 year old white female who was referred to my care by Benny Lennert and Roseanne Kaufman for evaluation treatment of hemorrhoidal disease.  Patient has had intermittent episodes of blood per rectum when she wipes herself.  She has had a colonoscopy in the recent past and was noted to have hemorrhoidal disease.  She has tried various creams.  She does intermittently have episodes of diarrhea with frequent bowel movements 5 times a day.  She has never had hemorrhoid surgery.  When her hemorrhoid is sticking out, she does have pain.  She describes this as 8 out of 10. Past Medical History:  Diagnosis Date  . Anxiety   . Anxiety disorder, unspecified   . Essential (primary) hypertension   . Gastro-esophageal reflux disease with esophagitis   . Hypokalemia   . Hypothyroidism   . Noninfective gastroenteritis and colitis, unspecified     Past Surgical History:  Procedure Laterality Date  . BIOPSY  09/02/2018   Procedure: BIOPSY;  Surgeon: Daneil Dolin, MD;  [facility];  Service: Endoscopy;;  colon   . CHOLECYSTECTOMY  05/19/2011   Procedure: LAPAROSCOPIC CHOLECYSTECTOMY;  Surgeon: Jamesetta So;  Location: [facility];  Service: General;  Laterality: N/A;  . COLONOSCOPY WITH PROPOFOL N/A 09/02/2018   Dr. Gala Romney: Proctocolitis limited to the left side with some exudate versus partial pseudomembrane formation, erosions and ulceration somewhat superficial.  Terminal ileum normal.  Diverticulosis.  Descending/sigmoid colon biopsy showing severe active colitis, diagnostic features of IBD not identified.  Differential includes infection, ischemia.  Rectal biopsies with granulation tissue c/w ulcer  . CYSTOSCOPY W/ URETERAL STENT PLACEMENT Right 05/08/2019   Procedure: CYSTOSCOPY WITH RETROGRADE PYELOGRAM/URETERAL STENT PLACEMENT;  Surgeon: Festus Aloe, MD;  Location: [facility];  Service: Urology;  Laterality: Right;  . CYSTOSCOPY WITH  URETEROSCOPY Right 06/11/2019   Procedure: CYSTOSCOPY WITH DIAGNOSTIC URETEROSCOPY; URETERAL STENT REMOVAL;  Surgeon: Cleon Gustin, MD;  Location: [facility];  Service: Urology;  Laterality: Right;  . TUBAL LIGATION      Family History  Problem Relation Age of Onset  . Anesthesia problems Neg Hx   . Hypotension Neg Hx   . Malignant hyperthermia Neg Hx   . Pseudochol deficiency Neg Hx     Current Outpatient Medications on File Prior to Visit  Medication Sig Dispense Refill  . acidophilus (RISAQUAD) CAPS capsule Take 1 capsule by mouth daily.    Marland Kitchen ALPRAZolam (XANAX) 0.5 MG tablet Take 0.5 mg by mouth at bedtime as needed for anxiety or sleep.     . budesonide (ENTOCORT EC) 3 MG 24 hr capsule Take 3 capsules (9 mg total) by mouth daily. (Patient not taking: Reported on 08/14/2019) 90 capsule 1  . HYDROcodone-acetaminophen (NORCO) 5-325 MG tablet Take 1 tablet by mouth every 6 (six) hours as needed for moderate pain. (Patient not taking: Reported on 08/14/2019) 30 tablet 0  . hydrocortisone (ANUSOL-HC) 2.5 % rectal cream Place 1 application rectally 2 (two) times daily. [place] [facility] HEMORRHOID CREAM (Patient not taking: Reported on 08/14/2019) 30 g 1  . ibuprofen (ADVIL) 200 MG tablet Take 200-400 mg by mouth every 6 (six) hours as needed for moderate pain.    Marland Kitchen levothyroxine (SYNTHROID, LEVOTHROID) 25 MCG tablet Take 25 mcg by mouth daily before breakfast.     . ondansetron (ZOFRAN ODT) 8 MG disintegrating tablet Take 1 tablet (8 mg total) by mouth every 8 (eight) hours as needed for nausea or  vomiting. (Patient not taking: Reported on 08/14/2019) 20 tablet 0  . pantoprazole (PROTONIX) 40 MG tablet Take 40 mg by mouth at bedtime.     . traZODone (DESYREL) 50 MG tablet Take 50 mg by mouth at bedtime as needed for sleep.      No current facility-administered medications on file prior to visit.    Allergies  Allergen Reactions  . Tramadol     Says "blew my head up."    Social History    Substance and Sexual Activity  Alcohol Use No    Social History   Tobacco Use  Smoking Status Never Smoker  Smokeless Tobacco Never Used    Review of Systems  Constitutional: Negative.   HENT: Negative.   Eyes: Negative.   Respiratory: Negative.   Cardiovascular: Negative.   Gastrointestinal: Negative.   Genitourinary: Negative.   Musculoskeletal: Negative.   Skin: Negative.   Neurological: Negative.   Endo/Heme/Allergies: Negative.   Psychiatric/Behavioral: Negative.     Objective   Vitals:   08/14/19 0912  BP: 127/72  Pulse: 79  Resp: 16  Temp: 97.8 F (36.6 C)  SpO2: 96%    Physical Exam Vitals reviewed.  Constitutional:      Appearance: Normal appearance. She is not ill-appearing.  HENT:     Head: Normocephalic and atraumatic.  Cardiovascular:     Rate and Rhythm: Normal rate and regular rhythm.     Heart sounds: Normal heart sounds. No murmur. No friction rub. No gallop.   Pulmonary:     Effort: Pulmonary effort is normal. No respiratory distress.     Breath sounds: Normal breath sounds. No stridor. No wheezing, rhonchi or rales.  Genitourinary:    Comments: Rectal examination reveals large internal and external hemorrhoid along the left lateral aspect of the anus.  It is somewhat irritated.  No active bleeding is noted.  Sphincter tone is within normal limits.  No mass lesions were noted. Skin:    General: Skin is warm and dry.  Neurological:     Mental Status: She is alert and oriented to person, place, and time.    Previous GI notes reviewed Assessment  Prolapsing hemorrhoidal disease with bleeding Plan   Patient is scheduled to undergo an extensive hemorrhoidectomy on 08/20/2019.  The risks and benefits of the procedure including bleeding, infection, pain, and recurrence of the hemorrhoidal disease were fully explained to the patient, who gave informed consent.

## 2019-08-15 NOTE — Progress Notes (Signed)
Rhonda Bailey; [id number]; January 08, 1946   HPI Patient is a 74 year old white female who was referred to my care by Benny Lennert and Roseanne Kaufman for evaluation treatment of hemorrhoidal disease.  Patient has had intermittent episodes of blood per rectum when she wipes herself.  She has had a colonoscopy in the recent past and was noted to have hemorrhoidal disease.  She has tried various creams.  She does intermittently have episodes of diarrhea with frequent bowel movements 5 times a day.  She has never had hemorrhoid surgery.  When her hemorrhoid is sticking out, she does have pain.  She describes this as 8 out of 10. Past Medical History:  Diagnosis Date  . Anxiety   . Anxiety disorder, unspecified   . Essential (primary) hypertension   . Gastro-esophageal reflux disease with esophagitis   . Hypokalemia   . Hypothyroidism   . Noninfective gastroenteritis and colitis, unspecified     Past Surgical History:  Procedure Laterality Date  . BIOPSY  09/02/2018   Procedure: BIOPSY;  Surgeon: Daneil Dolin, MD;  Location: [facility];  Service: Endoscopy;;  colon   . CHOLECYSTECTOMY  05/19/2011   Procedure: LAPAROSCOPIC CHOLECYSTECTOMY;  Surgeon: Jamesetta So;  Location: [facility];  Service: General;  Laterality: N/A;  . COLONOSCOPY WITH PROPOFOL N/A 09/02/2018   Dr. Gala Romney: Proctocolitis limited to the left side with some exudate versus partial pseudomembrane formation, erosions and ulceration somewhat superficial.  Terminal ileum normal.  Diverticulosis.  Descending/sigmoid colon biopsy showing severe active colitis, diagnostic features of IBD not identified.  Differential includes infection, ischemia.  Rectal biopsies with granulation tissue c/w ulcer  . CYSTOSCOPY W/ URETERAL STENT PLACEMENT Right 05/08/2019   Procedure: CYSTOSCOPY WITH RETROGRADE PYELOGRAM/URETERAL STENT PLACEMENT;  Surgeon: Festus Aloe, MD;  Location: [facility];  Service: Urology;  Laterality: Right;  . CYSTOSCOPY WITH  URETEROSCOPY Right 06/11/2019   Procedure: CYSTOSCOPY WITH DIAGNOSTIC URETEROSCOPY; URETERAL STENT REMOVAL;  Surgeon: Cleon Gustin, MD;  Location: [facility];  Service: Urology;  Laterality: Right;  . TUBAL LIGATION      Family History  Problem Relation Age of Onset  . Anesthesia problems Neg Hx   . Hypotension Neg Hx   . Malignant hyperthermia Neg Hx   . Pseudochol deficiency Neg Hx     Current Outpatient Medications on File Prior to Visit  Medication Sig Dispense Refill  . acidophilus (RISAQUAD) CAPS capsule Take 1 capsule by mouth daily.    Marland Kitchen ALPRAZolam (XANAX) 0.5 MG tablet Take 0.5 mg by mouth at bedtime as needed for anxiety or sleep.     . budesonide (ENTOCORT EC) 3 MG 24 hr capsule Take 3 capsules (9 mg total) by mouth daily. (Patient not taking: Reported on 08/14/2019) 90 capsule 1  . HYDROcodone-acetaminophen (NORCO) 5-325 MG tablet Take 1 tablet by mouth every 6 (six) hours as needed for moderate pain. (Patient not taking: Reported on 08/14/2019) 30 tablet 0  . hydrocortisone (ANUSOL-HC) 2.5 % rectal cream Place 1 application rectally 2 (two) times daily.  APOTHECARY HEMORRHOID CREAM (Patient not taking: Reported on 08/14/2019) 30 g 1  . ibuprofen (ADVIL) 200 MG tablet Take 200-400 mg by mouth every 6 (six) hours as needed for moderate pain.    Marland Kitchen levothyroxine (SYNTHROID, LEVOTHROID) 25 MCG tablet Take 25 mcg by mouth daily before breakfast.     . ondansetron (ZOFRAN ODT) 8 MG disintegrating tablet Take 1 tablet (8 mg total) by mouth every 8 (eight) hours as needed for nausea or  vomiting. (Patient not taking: Reported on 08/14/2019) 20 tablet 0  . pantoprazole (PROTONIX) 40 MG tablet Take 40 mg by mouth at bedtime.     . traZODone (DESYREL) 50 MG tablet Take 50 mg by mouth at bedtime as needed for sleep.      No current facility-administered medications on file prior to visit.    Allergies  Allergen Reactions  . Tramadol     Says "blew my head up."    Social History    Substance and Sexual Activity  Alcohol Use No    Social History   Tobacco Use  Smoking Status Never Smoker  Smokeless Tobacco Never Used    Review of Systems  Constitutional: Negative.   HENT: Negative.   Eyes: Negative.   Respiratory: Negative.   Cardiovascular: Negative.   Gastrointestinal: Negative.   Genitourinary: Negative.   Musculoskeletal: Negative.   Skin: Negative.   Neurological: Negative.   Endo/Heme/Allergies: Negative.   Psychiatric/Behavioral: Negative.     Objective   Vitals:   08/14/19 0912  BP: 127/72  Pulse: 79  Resp: 16  Temp: 97.8 F (36.6 C)  SpO2: 96%    Physical Exam Vitals reviewed.  Constitutional:      Appearance: Normal appearance. She is not ill-appearing.  HENT:     Head: Normocephalic and atraumatic.  Cardiovascular:     Rate and Rhythm: Normal rate and regular rhythm.     Heart sounds: Normal heart sounds. No murmur. No friction rub. No gallop.   Pulmonary:     Effort: Pulmonary effort is normal. No respiratory distress.     Breath sounds: Normal breath sounds. No stridor. No wheezing, rhonchi or rales.  Genitourinary:    Comments: Rectal examination reveals large internal and external hemorrhoid along the left lateral aspect of the anus.  It is somewhat irritated.  No active bleeding is noted.  Sphincter tone is within normal limits.  No mass lesions were noted. Skin:    General: Skin is warm and dry.  Neurological:     Mental Status: She is alert and oriented to person, place, and time.    Previous GI notes reviewed Assessment  Prolapsing hemorrhoidal disease with bleeding Plan   Patient is scheduled to undergo an extensive hemorrhoidectomy on 08/20/2019.  The risks and benefits of the procedure including bleeding, infection, pain, and recurrence of the hemorrhoidal disease were fully explained to the patient, who gave informed consent.

## 2019-08-15 NOTE — Patient Instructions (Signed)
Rhonda Bailey  08/15/2019     @PREFPERIOPPHARMACY @   Your procedure is scheduled on  08/20/2019 .  Report to [facility] at  [phone]  A.M.  Call this number if you have problems the morning of surgery:  [phone]   Remember:  Do not eat or drink after midnight.                       Take these medicines the morning of surgery with A SIP OF WATER  Levothyroxine, protonix.    Do not wear jewelry, make-up or nail polish.  Do not wear lotions, powders, or perfumes. Please wear deodorant and brush your teeth.  Do not shave 48 hours prior to surgery.  Men may shave face and neck.  Do not bring valuables to the hospital.  [facility] is not responsible for any belongings or valuables.  Contacts, dentures or bridgework may not be worn into surgery.  Leave your suitcase in the car.  After surgery it may be brought to your room.  For patients admitted to the hospital, discharge time will be determined by your treatment team.  Patients discharged the day of surgery will not be allowed to drive home.   Name and phone number of your driver:   family Special instructions:  None  Please read over the following fact sheets that you were given. Anesthesia Post-op Instructions and Care and Recovery After Surgery       Surgical Procedures for Hemorrhoids Surgical procedures can be used to treat hemorrhoids. Hemorrhoids are swollen veins that are inside the rectum (internal hemorrhoids) or around the anus (external hemorrhoids). They are caused by increased pressure in the anal area. This pressure may result from straining to have a bowel movement (constipation), diarrhea, pregnancy, obesity, or sitting for long periods of time. Hemorrhoids can cause symptoms such as pain and bleeding. Surgery may be needed if diet changes, lifestyle changes, and other treatments do not help your symptoms. Common surgical methods that may be used include:  Closed hemorrhoidectomy. The hemorrhoids  are surgically removed, and the incisions are closed with stitches (sutures).  Open hemorrhoidectomy. The hemorrhoids are surgically removed, but the incisions are allowed to heal without sutures.  Stapled hemorrhoidectomy. The hemorrhoids are partially removed, and the incisions are closed with staples. Tell a health care provider about:  Any allergies you have.  All medicines you are taking, including vitamins, herbs, eye drops, creams, and over-the-counter medicines.  Any problems you or family members have had with anesthetic medicines.  Any blood disorders you have.  Any surgeries you have had.  Any medical conditions you have.  Whether you are pregnant or may be pregnant. What are the risks? Generally, this is a safe procedure. However, problems may occur, including:  Infection.  Bleeding.  Allergic reactions to medicines.  Damage to other structures or organs.  Pain.  Constipation.  Difficulty passing urine.  Narrowing of the anal canal (stenosis).  Difficulty controlling bowel movements (incontinence).  Recurring hemorrhoids.  A new passage (fistula) that forms between the anus or rectum and another area. What happens before the procedure? Medicines  Ask your health care provider about: ? Changing or stopping your regular medicines. This is especially important if you are taking diabetes medicines or blood thinners. ? Taking medicines such as aspirin and ibuprofen. These medicines can thin your blood. Do not take these medicines unless your health care provider tells you  to take them. ? Taking over-the-counter medicines, vitamins, herbs, and supplements. Staying hydrated Follow instructions from your health care provider about hydration, which may include:  Up to 2 hours before the procedure - you may continue to drink clear liquids, such as water, clear fruit juice, black coffee, and plain tea.  Eating and drinking Follow instructions from your health  care provider about eating and drinking, which may include:  8 hours before the procedure - stop eating heavy meals or foods, such as meat, fried foods, or fatty foods.  6 hours before the procedure - stop eating light meals or foods, such as toast or cereal.  6 hours before the procedure - stop drinking milk or drinks that contain milk.  2 hours before the procedure - stop drinking clear liquids. General instructions  You may need to have a procedure to examine the inside of your colon with a scope (colonoscopy). Your health care provider may do this to make sure that there are no other causes for your bleeding or pain.  You may be instructed to take a laxative and an enema to clean out your colon before surgery (bowel prep). Carefully follow instructions from your health care provider about bowel prep.  Plan to have someone take you home from the hospital or clinic.  Plan to have a responsible adult care for you for at least 24 hours after you leave the hospital or clinic. This is important.  Ask your health care provider: ? How your surgery site will be marked. ? What steps will be taken to help prevent infection. These may include:  Washing skin with a germ-killing soap.  Taking antibiotic medicine. What happens during the procedure?  An IV will be inserted into one of your veins.  You will be given one or more of the following: ? A medicine to help you relax (sedative). ? A medicine to numb the area (local anesthetic). ? A medicine to make you fall asleep (general anesthetic). ? A medicine that is injected into an area of your body to numb everything below the injection site (regional anesthetic).  A lubricating jelly may be placed into your rectum.  Your surgeon will insert a short scope (anoscope) into your rectum to examine the hemorrhoids.  One of the following surgical methods will be used to remove the hemorrhoids: ? Closed hemorrhoidectomy.  Your surgeon will  use surgical instruments to open the tissue around the hemorrhoids.  The veins that supply the hemorrhoids will be tied off with a suture.  The hemorrhoids will be removed.  The tissue that surrounds the hemorrhoids will be closed with sutures that your body can absorb (absorbable sutures). ? Open hemorrhoidectomy.  The hemorrhoids will be removed with surgical instruments.  The incisions will be left open to heal without sutures. ? Stapled hemorrhoidectomy.  Your surgeon will use a circular stapling device to partially remove the hemorrhoids.  The device will be inserted into your anus. It will remove a circular ring of tissue that includes hemorrhoid tissue and some tissue above the hemorrhoids.  The staples in the device will close the edges of the tissue. This will cut off the blood supply to any remaining hemorrhoids and pull the tissue back into place. Each of these procedures may vary among health care providers and hospitals. What happens after the procedure?  Your blood pressure, heart rate, breathing rate, and blood oxygen level may be monitored until you leave the hospital or clinic.  You will be given  pain medicine as needed.  Do not drive for 24 hours if you were given a sedative during your procedure. Summary  Surgery may be needed for hemorrhoids if diet changes, lifestyle changes, and other treatments do not help your symptoms.  There are three common methods of surgery that are used to treat hemorrhoids.  Follow instructions from your health care provider about taking medicines and about eating and drinking before the procedure.  You may be instructed to take a laxative and an enema to clean out your colon before surgery (bowel prep). This information is not intended to replace advice given to you by your health care provider. Make sure you discuss any questions you have with your health care provider. Document Revised: 01/08/2019 Document Reviewed:  06/11/2018 Elsevier Patient Education  2020 [place] Anesthesia, Adult, Care After This sheet gives you information about how to care for yourself after your procedure. Your health care provider may also give you more specific instructions. If you have problems or questions, contact your health care provider. What can I expect after the procedure? After the procedure, the following side effects are common:  Pain or discomfort at the IV site.  Nausea.  Vomiting.  Sore throat.  Trouble concentrating.  Feeling cold or chills.  Weak or tired.  Sleepiness and fatigue.  Soreness and body aches. These side effects can affect parts of the body that were not involved in surgery. Follow these instructions at home:  For at least 24 hours after the procedure:  Have a responsible adult stay with you. It is important to have someone help care for you until you are awake and alert.  Rest as needed.  Do not: ? Participate in activities in which you could fall or become injured. ? Drive. ? Use heavy machinery. ? Drink alcohol. ? Take sleeping pills or medicines that cause drowsiness. ? Make important decisions or sign legal documents. ? Take care of children on your own. Eating and drinking  Follow any instructions from your health care provider about eating or drinking restrictions.  When you feel hungry, start by eating small amounts of foods that are soft and easy to digest (bland), such as toast. Gradually return to your regular diet.  Drink enough fluid to keep your urine pale yellow.  If you vomit, rehydrate by drinking water, juice, or clear broth. General instructions  If you have sleep apnea, surgery and certain medicines can increase your risk for breathing problems. Follow instructions from your health care provider about wearing your sleep device: ? Anytime you are sleeping, including during daytime naps. ? While taking prescription pain medicines,  sleeping medicines, or medicines that make you drowsy.  Return to your normal activities as told by your health care provider. Ask your health care provider what activities are safe for you.  Take over-the-counter and prescription medicines only as told by your health care provider.  If you smoke, do not smoke without supervision.  Keep all follow-up visits as told by your health care provider. This is important. Contact a health care provider if:  You have nausea or vomiting that does not get better with medicine.  You cannot eat or drink without vomiting.  You have pain that does not get better with medicine.  You are unable to pass urine.  You develop a skin rash.  You have a fever.  You have redness around your IV site that gets worse. Get help right away if:  You have difficulty breathing.  You have chest pain.  You have blood in your urine or stool, or you vomit blood. Summary  After the procedure, it is common to have a sore throat or nausea. It is also common to feel tired.  Have a responsible adult stay with you for the first 24 hours after general anesthesia. It is important to have someone help care for you until you are awake and alert.  When you feel hungry, start by eating small amounts of foods that are soft and easy to digest (bland), such as toast. Gradually return to your regular diet.  Drink enough fluid to keep your urine pale yellow.  Return to your normal activities as told by your health care provider. Ask your health care provider what activities are safe for you. This information is not intended to replace advice given to you by your health care provider. Make sure you discuss any questions you have with your health care provider. Document Revised: 07/27/2017 Document Reviewed: 03/09/2017 Elsevier Patient Education  [place]. How to Use Chlorhexidine for Bathing Chlorhexidine gluconate (CHG) is a germ-killing (antiseptic) solution that  is used to clean the skin. It can get rid of the bacteria that normally live on the skin and can keep them away for about 24 hours. To clean your skin with CHG, you may be given:  A CHG solution to use in the shower or as part of a sponge bath.  A prepackaged cloth that contains CHG. Cleaning your skin with CHG may help lower the risk for infection:  While you are staying in the intensive care unit of the hospital.  If you have a vascular access, such as a central line, to provide short-term or long-term access to your veins.  If you have a catheter to drain urine from your bladder.  If you are on a ventilator. A ventilator is a machine that helps you breathe by moving air in and out of your lungs.  After surgery. What are the risks? Risks of using CHG include:  A skin reaction.  Hearing loss, if CHG gets in your ears.  Eye injury, if CHG gets in your eyes and is not rinsed out.  The CHG product catching fire. Make sure that you avoid smoking and flames after applying CHG to your skin. Do not use CHG:  If you have a chlorhexidine allergy or have previously reacted to chlorhexidine.  On babies younger than 74 months of age. How to use CHG solution  Use CHG only as told by your health care provider, and follow the instructions on the label.  Use the full amount of CHG as directed. Usually, this is one bottle. During a shower Follow these steps when using CHG solution during a shower (unless your health care provider gives you different instructions): 1. Start the shower. 2. Use your normal soap and shampoo to wash your face and hair. 3. Turn off the shower or move out of the shower stream. 4. Pour the CHG onto a clean washcloth. Do not use any type of brush or rough-edged sponge. 5. Starting at your neck, lather your body down to your toes. Make sure you follow these instructions: ? If you will be having surgery, pay special attention to the part of your body where you will  be having surgery. Scrub this area for at least 1 minute. ? Do not use CHG on your head or face. If the solution gets into your ears or eyes, rinse them well with water. ?  Avoid your genital area. ? Avoid any areas of skin that have broken skin, cuts, or scrapes. ? Scrub your back and under your arms. Make sure to wash skin folds. 6. Let the lather sit on your skin for 1-2 minutes or as long as told by your health care provider. 7. Thoroughly rinse your entire body in the shower. Make sure that all body creases and crevices are rinsed well. 8. Dry off with a clean towel. Do not put any substances on your body afterward--such as powder, lotion, or perfume--unless you are told to do so by your health care provider. Only use lotions that are recommended by the manufacturer. 9. Put on clean clothes or pajamas. 10. If it is the night before your surgery, sleep in clean sheets.  During a sponge bath Follow these steps when using CHG solution during a sponge bath (unless your health care provider gives you different instructions): 1. Use your normal soap and shampoo to wash your face and hair. 2. Pour the CHG onto a clean washcloth. 3. Starting at your neck, lather your body down to your toes. Make sure you follow these instructions: ? If you will be having surgery, pay special attention to the part of your body where you will be having surgery. Scrub this area for at least 1 minute. ? Do not use CHG on your head or face. If the solution gets into your ears or eyes, rinse them well with water. ? Avoid your genital area. ? Avoid any areas of skin that have broken skin, cuts, or scrapes. ? Scrub your back and under your arms. Make sure to wash skin folds. 4. Let the lather sit on your skin for 1-2 minutes or as long as told by your health care provider. 5. Using a different clean, wet washcloth, thoroughly rinse your entire body. Make sure that all body creases and crevices are rinsed well. 6. Dry off  with a clean towel. Do not put any substances on your body afterward--such as powder, lotion, or perfume--unless you are told to do so by your health care provider. Only use lotions that are recommended by the manufacturer. 7. Put on clean clothes or pajamas. 8. If it is the night before your surgery, sleep in clean sheets. How to use CHG prepackaged cloths  Only use CHG cloths as told by your health care provider, and follow the instructions on the label.  Use the CHG cloth on clean, dry skin.  Do not use the CHG cloth on your head or face unless your health care provider tells you to.  When washing with the CHG cloth: ? Avoid your genital area. ? Avoid any areas of skin that have broken skin, cuts, or scrapes. Before surgery Follow these steps when using a CHG cloth to clean before surgery (unless your health care provider gives you different instructions): 1. Using the CHG cloth, vigorously scrub the part of your body where you will be having surgery. Scrub using a back-and-forth motion for 3 minutes. The area on your body should be completely wet with CHG when you are done scrubbing. 2. Do not rinse. Discard the cloth and let the area air-dry. Do not put any substances on the area afterward, such as powder, lotion, or perfume. 3. Put on clean clothes or pajamas. 4. If it is the night before your surgery, sleep in clean sheets.  For general bathing Follow these steps when using CHG cloths for general bathing (unless your health care provider gives  you different instructions). 1. Use a separate CHG cloth for each area of your body. Make sure you wash between any folds of skin and between your fingers and toes. Wash your body in the following order, switching to a new cloth after each step: ? The front of your neck, shoulders, and chest. ? Both of your arms, under your arms, and your hands. ? Your stomach and groin area, avoiding the genitals. ? Your right leg and foot. ? Your left leg  and foot. ? The back of your neck, your back, and your buttocks. 2. Do not rinse. Discard the cloth and let the area air-dry. Do not put any substances on your body afterward--such as powder, lotion, or perfume--unless you are told to do so by your health care provider. Only use lotions that are recommended by the manufacturer. 3. Put on clean clothes or pajamas. Contact a health care provider if:  Your skin gets irritated after scrubbing.  You have questions about using your solution or cloth. Get help right away if:  Your eyes become very red or swollen.  Your eyes itch badly.  Your skin itches badly and is red or swollen.  Your hearing changes.  You have trouble seeing.  You have swelling or tingling in your mouth or throat.  You have trouble breathing.  You swallow any chlorhexidine. Summary  Chlorhexidine gluconate (CHG) is a germ-killing (antiseptic) solution that is used to clean the skin. Cleaning your skin with CHG may help to lower your risk for infection.  You may be given CHG to use for bathing. It may be in a bottle or in a prepackaged cloth to use on your skin. Carefully follow your health care provider's instructions and the instructions on the product label.  Do not use CHG if you have a chlorhexidine allergy.  Contact your health care provider if your skin gets irritated after scrubbing. This information is not intended to replace advice given to you by your health care provider. Make sure you discuss any questions you have with your health care provider. Document Revised: 10/10/2018 Document Reviewed: 06/21/2017 Elsevier Patient Education  [place].

## 2019-08-15 NOTE — Telephone Encounter (Signed)
Have reviewed the notation in the chart briefly. Willing to see patient as a new consult. Agree with workup that has been done so far. May require additional/repeat endoscopic evaluation for her symptoms. OK to schedule but not overbook. GM

## 2019-08-18 ENCOUNTER — Other Ambulatory Visit (HOSPITAL_COMMUNITY)
Admission: RE | Admit: 2019-08-18 | Discharge: 2019-08-18 | Disposition: A | Discharge status: Home / Self Care | Payer: Medicare HMO | Source: Ambulatory Visit | Attending: General Surgery | Admitting: General Surgery

## 2019-08-18 ENCOUNTER — Other Ambulatory Visit: Payer: Self-pay

## 2019-08-18 ENCOUNTER — Encounter (HOSPITAL_COMMUNITY)
Admission: RE | Admit: 2019-08-18 | Discharge: 2019-08-18 | Disposition: A | Discharge status: Home / Self Care | Payer: Medicare HMO | Source: Ambulatory Visit | Attending: General Surgery | Admitting: General Surgery

## 2019-08-18 DIAGNOSIS — Z01810 Encounter for preprocedural cardiovascular examination: Secondary | ICD-10-CM | POA: Insufficient documentation

## 2019-08-18 DIAGNOSIS — Z01812 Encounter for preprocedural laboratory examination: Secondary | ICD-10-CM | POA: Insufficient documentation

## 2019-08-18 LAB — BASIC METABOLIC PANEL
Anion gap: 9 (ref 5–15)
BUN: 14 mg/dL (ref 8–23)
CO2: 25 mmol/L (ref 22–32)
Calcium: 8.8 mg/dL — ABNORMAL LOW (ref 8.9–10.3)
Chloride: 108 mmol/L (ref 98–111)
Creatinine, Ser: 0.96 mg/dL (ref 0.44–1.00)
GFR calc Af Amer: >60 mL/min (ref >60)
GFR calc non Af Amer: 59 mL/min — ABNORMAL LOW (ref >60)
Glucose, Bld: 105 mg/dL — ABNORMAL HIGH (ref 70–99)
Potassium: 3.6 mmol/L (ref 3.5–5.1)
Sodium: 142 mmol/L (ref 135–145)

## 2019-08-18 LAB — SARS CORONAVIRUS 2 (TAT 6-24 HRS): SARS Coronavirus 2: POSITIVE — AB

## 2019-08-19 ENCOUNTER — Ambulatory Visit: Payer: Medicare HMO | Admitting: Internal Medicine

## 2019-08-19 ENCOUNTER — Telehealth: Payer: Self-pay | Admitting: Nurse Practitioner

## 2019-08-19 NOTE — Telephone Encounter (Signed)
Called to Discuss with patient about Covid symptoms and the use of bamlanivimab, a monoclonal antibody infusion for those with mild to moderate Covid symptoms and at a high risk of hospitalization.     Pt is qualified for this infusion at the [facility] infusion center due to co-morbid conditions and/or a member of an at-risk group.     Patient Active Problem List   Diagnosis Date Noted  . Hemorrhoids 07/29/2019  . Anxiety disorder, unspecified   . Gastro-esophageal reflux disease with esophagitis   . Essential (primary) hypertension   . Noninfective gastroenteritis and colitis, unspecified   . Pyelonephritis   . Sepsis with acute organ dysfunction ([place]) 05/08/2019  . Nephrolithiasis 05/08/2019  . Rigors 05/08/2019  . Hypothyroidism   . UTI (urinary tract infection)   . Gross hematuria   . Renal colic on right side   . Ureteral stone with hydronephrosis   . Dehydration   . AKI (acute kidney injury) ([place])   . Thrombocytosis ([place])   . Dysphagia 01/21/2019  . Microcytic anemia 01/21/2019  . Anemia 08/29/2018  . Insomnia 08/29/2018  . Abnormal CT of the abdomen 08/29/2018  . Diarrhea of infectious origin   . Colitis 08/28/2018  . Adrenal nodule ([place]) 08/28/2018  . Hypokalemia 08/28/2018  . Dehydration with hyponatremia 08/28/2018  . Leukocytosis 08/28/2018    Patient declines infusion at this time. Patient states that she is not having any symptoms. Symptoms tier reviewed as well as criteria for ending isolation. Preventative practices reviewed. Patient verbalized understanding.    Patient advised to call back if she does develop symptoms and decides that she does want to get infusion. Callback number to the infusion center given. Patient advised to go to Urgent care or ED with severe symptoms.

## 2019-08-19 NOTE — OR Nursing (Signed)
Abnormal lab reported to Dr. Arnoldo Morale.

## 2019-08-20 ENCOUNTER — Encounter (HOSPITAL_COMMUNITY): Admission: RE | Payer: Self-pay | Source: Home / Self Care

## 2019-08-20 ENCOUNTER — Ambulatory Visit (HOSPITAL_COMMUNITY): Admission: RE | Admit: 2019-08-20 | Payer: Medicare HMO | Source: Home / Self Care | Admitting: General Surgery

## 2019-08-20 SURGERY — HEMORRHOIDECTOMY
Anesthesia: General

## 2019-09-01 NOTE — Telephone Encounter (Signed)
Noted  

## 2019-09-01 NOTE — Telephone Encounter (Signed)
Can we see how patient is doing? She had been on entocort empirically. If this is not helping, we need to wean off. Hemorrhoid surgery with Dr. Arnoldo Morale postponed due to Covid.

## 2019-09-01 NOTE — Telephone Encounter (Signed)
Alicia: actually, she is transferring her care to [facility] from review of records.

## 2019-09-04 ENCOUNTER — Other Ambulatory Visit: Payer: Self-pay

## 2019-09-04 ENCOUNTER — Encounter: Payer: Self-pay | Admitting: General Surgery

## 2019-09-04 ENCOUNTER — Ambulatory Visit: Payer: Medicare HMO | Admitting: General Surgery

## 2019-09-04 VITALS — BP 160/79 | HR 96 | Temp 98.0°F | Resp 16 | Ht 68.0 in | Wt 170.0 lb

## 2019-09-04 DIAGNOSIS — K649 Unspecified hemorrhoids: Secondary | ICD-10-CM | POA: Diagnosis not present

## 2019-09-04 NOTE — Progress Notes (Signed)
Subjective:     Rhonda Bailey  Patient presented with hemorrhoidal tenderness and history of mild fevers.  This morning, she is afebrile and feels better.  She is scheduled for hemorrhoidectomy on 09/16/2019.  Her surgery was delayed due to being Covid positive.  She does not seem to have any symptoms of Covid positivity at the present time. Objective:    BP (!) 160/79 (BP Location: Left Arm, Patient Position: Sitting, Cuff Size: Normal)   Pulse 96   Temp 98 F (36.7 C) (Oral)   Resp 16   Ht 5' 8"  (1.727 m)   Wt 170 lb (77.1 kg)   SpO2 97%   BMI 25.85 kg/m   General:  alert, cooperative and no distress       Assessment:    Bleeding hemorrhoids, recent history of Covid positive testing.  Currently asymptomatic from Covid.    Plan:   Reassured patient that we can proceed with surgery on 09/16/2019.

## 2019-09-04 NOTE — H&P (View-Only) (Signed)
Subjective:     Rhonda Bailey  Patient presented with hemorrhoidal tenderness and history of mild fevers.  This morning, she is afebrile and feels better.  She is scheduled for hemorrhoidectomy on 09/16/2019.  Her surgery was delayed due to being Covid positive.  She does not seem to have any symptoms of Covid positivity at the present time. Objective:    BP (!) 160/79 (BP Location: Left Arm, Patient Position: Sitting, Cuff Size: Normal)   Pulse 96   Temp 98 F (36.7 C) (Oral)   Resp 16   Ht 5' 8"  (1.727 m)   Wt 170 lb (77.1 kg)   SpO2 97%   BMI 25.85 kg/m   General:  alert, cooperative and no distress       Assessment:    Bleeding hemorrhoids, recent history of Covid positive testing.  Currently asymptomatic from Covid.    Plan:   Reassured patient that we can proceed with surgery on 09/16/2019.

## 2019-09-11 ENCOUNTER — Ambulatory Visit: Payer: Medicare HMO | Admitting: Gastroenterology

## 2019-09-11 ENCOUNTER — Encounter (HOSPITAL_COMMUNITY)
Admission: RE | Admit: 2019-09-11 | Discharge: 2019-09-11 | Disposition: A | Discharge status: Home / Self Care | Payer: Medicare HMO | Source: Ambulatory Visit | Attending: General Surgery | Admitting: General Surgery

## 2019-09-11 ENCOUNTER — Other Ambulatory Visit: Payer: Self-pay

## 2019-09-12 ENCOUNTER — Ambulatory Visit: Payer: Medicare HMO | Admitting: Gastroenterology

## 2019-09-15 ENCOUNTER — Ambulatory Visit (HOSPITAL_COMMUNITY): Payer: Medicare HMO

## 2019-09-15 ENCOUNTER — Telehealth: Payer: Self-pay | Admitting: Gastroenterology

## 2019-09-15 NOTE — Telephone Encounter (Signed)
Pt has questions about what she can and can not eat. Also, wanted to know when she needed to follow up with [place] again. Please advise. [phone]

## 2019-09-15 NOTE — Telephone Encounter (Signed)
AB, please advise about pt. Pt was suppose to been seen in [place] and said she wasn't because she had a procedure. She said if we don't figure out what's wrong with her, she will have to see someone else. She was taking Entocort and said a doctor told her to D/C and it was causing her hemorrhoids to flare with the diarrhea. Please advise if pt is suppose to follow instruction from our office or [place]. Pt is looking for foods she can eat with her condition, hx of diverticulitis and diarrhea.

## 2019-09-16 ENCOUNTER — Ambulatory Visit (HOSPITAL_COMMUNITY)
Admission: RE | Admit: 2019-09-16 | Discharge: 2019-09-16 | Disposition: A | Discharge status: Home / Self Care | Payer: Medicare HMO | Attending: General Surgery | Admitting: General Surgery

## 2019-09-16 ENCOUNTER — Encounter (HOSPITAL_COMMUNITY)
Admission: RE | Disposition: A | Discharge status: Home / Self Care | Payer: Self-pay | Source: Home / Self Care | Attending: General Surgery

## 2019-09-16 ENCOUNTER — Ambulatory Visit (HOSPITAL_COMMUNITY): Payer: Medicare HMO | Admitting: Anesthesiology

## 2019-09-16 ENCOUNTER — Encounter (HOSPITAL_COMMUNITY): Payer: Self-pay | Admitting: General Surgery

## 2019-09-16 DIAGNOSIS — Z87448 Personal history of other diseases of urinary system: Secondary | ICD-10-CM | POA: Insufficient documentation

## 2019-09-16 DIAGNOSIS — I10 Essential (primary) hypertension: Secondary | ICD-10-CM | POA: Insufficient documentation

## 2019-09-16 DIAGNOSIS — Z8616 Personal history of COVID-19: Secondary | ICD-10-CM | POA: Diagnosis not present

## 2019-09-16 DIAGNOSIS — K626 Ulcer of anus and rectum: Secondary | ICD-10-CM | POA: Insufficient documentation

## 2019-09-16 DIAGNOSIS — D649 Anemia, unspecified: Secondary | ICD-10-CM | POA: Insufficient documentation

## 2019-09-16 DIAGNOSIS — K644 Residual hemorrhoidal skin tags: Secondary | ICD-10-CM | POA: Insufficient documentation

## 2019-09-16 DIAGNOSIS — K642 Third degree hemorrhoids: Secondary | ICD-10-CM | POA: Diagnosis not present

## 2019-09-16 DIAGNOSIS — F419 Anxiety disorder, unspecified: Secondary | ICD-10-CM | POA: Insufficient documentation

## 2019-09-16 DIAGNOSIS — K648 Other hemorrhoids: Secondary | ICD-10-CM | POA: Diagnosis not present

## 2019-09-16 DIAGNOSIS — K219 Gastro-esophageal reflux disease without esophagitis: Secondary | ICD-10-CM | POA: Diagnosis not present

## 2019-09-16 DIAGNOSIS — K6289 Other specified diseases of anus and rectum: Secondary | ICD-10-CM | POA: Diagnosis not present

## 2019-09-16 HISTORY — PX: RECTAL BIOPSY: SHX2303

## 2019-09-16 HISTORY — PX: HEMORRHOID SURGERY: SHX153

## 2019-09-16 SURGERY — HEMORRHOIDECTOMY
Anesthesia: General | Site: Rectum

## 2019-09-16 MED ORDER — ONDANSETRON HCL 4 MG/2ML IJ SOLN: 4.0000 mg | Freq: Once | INTRAMUSCULAR | Status: DC | PRN

## 2019-09-16 MED ORDER — HYDROMORPHONE HCL 1 MG/ML IJ SOLN
INTRAMUSCULAR | Status: AC
  Filled 2019-09-16: qty 0.5

## 2019-09-16 MED ORDER — DEXAMETHASONE SODIUM PHOSPHATE 10 MG/ML IJ SOLN
INTRAMUSCULAR | Status: AC
  Filled 2019-09-16: qty 1

## 2019-09-16 MED ORDER — SODIUM CHLORIDE 0.9 % IR SOLN
Status: DC | PRN
  Administered 2019-09-16: 1000 mL

## 2019-09-16 MED ORDER — EPHEDRINE 5 MG/ML INJ
INTRAVENOUS | Status: AC
  Filled 2019-09-16: qty 20

## 2019-09-16 MED ORDER — ONDANSETRON HCL 4 MG/2ML IJ SOLN
INTRAMUSCULAR | Status: AC
  Filled 2019-09-16: qty 2

## 2019-09-16 MED ORDER — ACETAMINOPHEN 10 MG/ML IV SOLN
INTRAVENOUS | Status: AC
  Filled 2019-09-16: qty 100

## 2019-09-16 MED ORDER — BUPIVACAINE LIPOSOME 1.3 % IJ SUSP
INTRAMUSCULAR | Status: DC | PRN
  Administered 2019-09-16: 15 mL

## 2019-09-16 MED ORDER — LACTATED RINGERS IV SOLN: Freq: Once | INTRAVENOUS | Status: AC

## 2019-09-16 MED ORDER — ROCURONIUM BROMIDE 10 MG/ML (PF) SYRINGE
PREFILLED_SYRINGE | INTRAVENOUS | Status: AC
  Filled 2019-09-16: qty 10

## 2019-09-16 MED ORDER — HYDROCODONE-ACETAMINOPHEN 5-325 MG PO TABS: 1.0000 | ORAL_TABLET | Freq: Four times a day (QID) | ORAL | 0 refills | Status: DC | PRN

## 2019-09-16 MED ORDER — FENTANYL CITRATE (PF) 100 MCG/2ML IJ SOLN: 25.0000 ug | INTRAMUSCULAR | Status: DC | PRN

## 2019-09-16 MED ORDER — FENTANYL CITRATE (PF) 250 MCG/5ML IJ SOLN
INTRAMUSCULAR | Status: AC
  Filled 2019-09-16: qty 5

## 2019-09-16 MED ORDER — DEXAMETHASONE SODIUM PHOSPHATE 4 MG/ML IJ SOLN
INTRAMUSCULAR | Status: DC | PRN
  Administered 2019-09-16: 10 mg via INTRAVENOUS

## 2019-09-16 MED ORDER — LIDOCAINE HCL (PF) 1 % IJ SOLN
INTRAMUSCULAR | Status: AC
  Filled 2019-09-16: qty 30

## 2019-09-16 MED ORDER — LIDOCAINE 2% (20 MG/ML) 5 ML SYRINGE
INTRAMUSCULAR | Status: AC
  Filled 2019-09-16: qty 10

## 2019-09-16 MED ORDER — LIDOCAINE VISCOUS HCL 2 % MT SOLN
OROMUCOSAL | Status: AC
  Filled 2019-09-16: qty 15

## 2019-09-16 MED ORDER — PROPOFOL 10 MG/ML IV BOLUS
INTRAVENOUS | Status: AC
  Filled 2019-09-16: qty 40

## 2019-09-16 MED ORDER — MEPERIDINE HCL 50 MG/ML IJ SOLN: 6.2500 mg | INTRAMUSCULAR | Status: DC | PRN

## 2019-09-16 MED ORDER — LACTATED RINGERS IV SOLN: INTRAVENOUS | Status: DC | PRN

## 2019-09-16 MED ORDER — ACETAMINOPHEN 10 MG/ML IV SOLN
1000.0000 mg | Freq: Once | INTRAVENOUS | Status: AC
  Administered 2019-09-16: 1000 mg via INTRAVENOUS

## 2019-09-16 MED ORDER — METRONIDAZOLE IN NACL 5-0.79 MG/ML-% IV SOLN
500.0000 mg | INTRAVENOUS | Status: AC
  Administered 2019-09-16: 500 mg via INTRAVENOUS
  Filled 2019-09-16: qty 100

## 2019-09-16 MED ORDER — HYDROMORPHONE HCL 1 MG/ML IJ SOLN
0.2500 mg | INTRAMUSCULAR | Status: DC | PRN
  Administered 2019-09-16: 0.5 mg via INTRAVENOUS

## 2019-09-16 MED ORDER — PROPOFOL 10 MG/ML IV BOLUS
INTRAVENOUS | Status: DC | PRN
  Administered 2019-09-16: 160 mg via INTRAVENOUS

## 2019-09-16 MED ORDER — BUPIVACAINE LIPOSOME 1.3 % IJ SUSP
INTRAMUSCULAR | Status: AC
  Filled 2019-09-16: qty 20

## 2019-09-16 MED ORDER — LIDOCAINE HCL (CARDIAC) PF 50 MG/5ML IV SOSY
PREFILLED_SYRINGE | INTRAVENOUS | Status: DC | PRN
  Administered 2019-09-16: 60 mg via INTRAVENOUS

## 2019-09-16 MED ORDER — ONDANSETRON HCL 4 MG/2ML IJ SOLN
INTRAMUSCULAR | Status: DC | PRN
  Administered 2019-09-16: 4 mg via INTRAVENOUS

## 2019-09-16 MED ORDER — SUCCINYLCHOLINE CHLORIDE 200 MG/10ML IV SOSY
PREFILLED_SYRINGE | INTRAVENOUS | Status: AC
  Filled 2019-09-16: qty 20

## 2019-09-16 MED ORDER — LIDOCAINE VISCOUS HCL 2 % MT SOLN
OROMUCOSAL | Status: DC | PRN
  Administered 2019-09-16: 1

## 2019-09-16 MED ORDER — FENTANYL CITRATE (PF) 100 MCG/2ML IJ SOLN
INTRAMUSCULAR | Status: DC | PRN
  Administered 2019-09-16: 25 ug via INTRAVENOUS
  Administered 2019-09-16: 50 ug via INTRAVENOUS
  Administered 2019-09-16: 25 ug via INTRAVENOUS
  Administered 2019-09-16: 50 ug via INTRAVENOUS
  Administered 2019-09-16: 25 ug via INTRAVENOUS

## 2019-09-16 SURGICAL SUPPLY — 26 items
CLOTH BEACON ORANGE TIMEOUT ST (SAFETY) ×2 IMPLANT
COVER LIGHT HANDLE STERIS (MISCELLANEOUS) ×4 IMPLANT
COVER WAND RF STERILE (DRAPES) ×4 IMPLANT
DRAPE HALF SHEET 40X57 (DRAPES) ×2 IMPLANT
ELECT REM PT RETURN 9FT ADLT (ELECTROSURGICAL) ×2
ELECTRODE REM PT RTRN 9FT ADLT (ELECTROSURGICAL) ×1 IMPLANT
GAUZE SPONGE 4X4 12PLY STRL (GAUZE/BANDAGES/DRESSINGS) ×3 IMPLANT
GLOVE BIOGEL PI IND STRL 7.0 (GLOVE) ×2 IMPLANT
GLOVE BIOGEL PI INDICATOR 7.0 (GLOVE) ×2
GLOVE SURG SS PI 7.5 STRL IVOR (GLOVE) ×2 IMPLANT
GOWN STRL REUS W/TWL LRG LVL3 (GOWN DISPOSABLE) ×4 IMPLANT
HEMOSTAT SURGICEL 4X8 (HEMOSTASIS) ×2 IMPLANT
KIT TURNOVER CYSTO (KITS) ×2 IMPLANT
MANIFOLD NEPTUNE II (INSTRUMENTS) ×2 IMPLANT
NDL HYPO 18GX1.5 BLUNT FILL (NEEDLE) ×1 IMPLANT
NEEDLE HYPO 18GX1.5 BLUNT FILL (NEEDLE) ×2 IMPLANT
NEEDLE HYPO 22GX1.5 SAFETY (NEEDLE) ×2 IMPLANT
NS IRRIG 1000ML POUR BTL (IV SOLUTION) ×2 IMPLANT
PACK PERI GYN (CUSTOM PROCEDURE TRAY) ×2 IMPLANT
PAD ARMBOARD 7.5X6 YLW CONV (MISCELLANEOUS) ×2 IMPLANT
PAD TELFA 3X4 1S STER (GAUZE/BANDAGES/DRESSINGS) ×1 IMPLANT
SET BASIN LINEN APH (SET/KITS/TRAYS/PACK) ×2 IMPLANT
SURGILUBE 2OZ TUBE FLIPTOP (MISCELLANEOUS) ×2 IMPLANT
SUT SILK 0 FSL (SUTURE) ×2 IMPLANT
SUT VIC AB 2-0 CT2 27 (SUTURE) ×2 IMPLANT
SYR 20ML LL LF (SYRINGE) ×4 IMPLANT

## 2019-09-16 NOTE — Op Note (Signed)
Patient:  Rhonda Bailey  DOB:  Apr 17, 1946  MRN:  [id number]   Preop Diagnosis: Bleeding hemorrhoidal disease  Postop Diagnosis: Same, rectal ulceration  Procedure: Internal and external hemorrhoidectomy x2, biopsy of rectal ulceration  Surgeon: Aviva Signs, MD  Anes: General  Indications: Patient is a 74 year old white female who presents with rectal pain and bleeding hemorrhoids.  The risks and benefits of the procedure including bleeding, infection, and recurrence of the hemorrhoidal disease were fully explained to the patient, who gave informed consent.  Procedure note: The patient was placed in the lithotomy position after general anesthesia was administered.  The perineum was prepped and draped using the usual sterile technique with Betadine.  Surgical site confirmation was performed.  On anoscopy, the patient was noted to have prominent external hemorrhoids at the 4:00 and 8:00 positions with a smaller one present at the 1 o'clock position.  There was also some superficial rectal mucosal prolapse. An internal hemorrhoid noted at the 8 o'clock position.  The anal canal was narrowed due to a large ulcerative posterior lesion with scarring and the external sphincter exposed.  The tissue was hard and noncompliant in this region.  The ulceration encompassed approximately 40 to 50% of the posterior canal.  Multiple biopsies were performed of the rectal ulceration.  In addition, the hemorrhoids at the 4 and 8 o'clock positions were excised using the LigaSure.  And bleeding was controlled using Bovie electrocautery.  Exparel was instilled into the surrounding perineum.  Surgicel and viscous Xylocaine rectal packing was then placed.  All tape and needle counts were correct at the end of the procedure.  The patient was awakened and transferred to PACU in stable condition.  Complications: None  EBL: Minimal  Specimen: Hemorrhoids, rectal ulceration biopsies

## 2019-09-16 NOTE — Telephone Encounter (Signed)
Lmom, waiting on a return call.  

## 2019-09-16 NOTE — Anesthesia Preprocedure Evaluation (Addendum)
Anesthesia Evaluation  Patient identified by MRN, date of birth, ID band Patient awake    Reviewed: Allergy & Precautions, NPO status , Patient's Chart, lab work & pertinent test results  History of Anesthesia Complications Negative for: history of anesthetic complications  Airway Mallampati: II  TM Distance: >3 FB Neck ROM: Full   Comment: Redness of tongue and lower lip. Risk of soreness and redness get worse after anesthesia was explained to the patient. Dental  (+) Dental Advisory Given Permanent bridges:   Pulmonary  covid positive 4 weeks ago   Pulmonary exam normal breath sounds clear to auscultation       Cardiovascular Exercise Tolerance: Good hypertension (not on meds), Normal cardiovascular exam Rhythm:Regular Rate:Normal  18-Aug-2019 11:28:40 [facility] ROUTINE RECORD Normal sinus rhythm Normal ECG Confirmed by Asencion Noble [phone]) on 08/20/2019 8:21:19 PM   Neuro/Psych PSYCHIATRIC DISORDERS Anxiety negative neurological ROS     GI/Hepatic GERD  Medicated,  Endo/Other  Hypothyroidism   Renal/GU Renal disease (AKI)     Musculoskeletal   Abdominal   Peds negative pediatric ROS (+)  Hematology  (+) anemia ,   Anesthesia Other Findings   Reproductive/Obstetrics                          Anesthesia Physical Anesthesia Plan  ASA: II  Anesthesia Plan: General   Post-op Pain Management:    Induction: Intravenous  PONV Risk Score and Plan: 4 or greater and Ondansetron, Dexamethasone, Midazolam and Treatment may vary due to age or medical condition  Airway Management Planned: LMA  Additional Equipment:   Intra-op Plan:   Post-operative Plan: Extubation in OR  Informed Consent: I have reviewed the patients History and Physical, chart, labs and discussed the procedure including the risks, benefits and alternatives for the proposed anesthesia with the patient  or authorized representative who has indicated his/her understanding and acceptance.     Dental advisory given  Plan Discussed with: CRNA and Surgeon  Anesthesia Plan Comments:        Anesthesia Quick Evaluation

## 2019-09-16 NOTE — Telephone Encounter (Addendum)
From what I saw, she was transferring care to [facility]. Is this still the case? I don't believe she has been seen yet in [place]. I have provided recommendations in the past, but she has declined some of these and wanted to wait until after hemorrhoidectomy. We have been limited in diagnostic evaluation because of this.  She needs to decide if keeping care here or transferring. If she is keeping care here and has not established care in [place], then please do the following:  As she is reporting diarrhea, we need to check stool studies.   Needs in person office visit.   Please provide a low-residue diet.   Anticipate repeat colonoscopy with biopsies once recovered from hemorrhoidectomy.

## 2019-09-16 NOTE — Interval H&P Note (Signed)
History and Physical Interval Note:  09/16/2019 7:14 AM  Rhonda Bailey  has presented today for surgery, with the diagnosis of PROLAPSING HEMORRHOIDS.  The various methods of treatment have been discussed with the patient and family. After consideration of risks, benefits and other options for treatment, the patient has consented to  Procedure(s): EXTENSIVE HEMORRHOIDECTOMY (N/A) as a surgical intervention.  The patient's history has been reviewed, patient examined, no change in status, stable for surgery.  I have reviewed the patient's chart and labs.  Questions were answered to the patient's satisfaction.     Aviva Signs

## 2019-09-16 NOTE — Transfer of Care (Signed)
Immediate Anesthesia Transfer of Care Note  Patient: Rhonda Bailey  Procedure(s) Performed: EXTENSIVE HEMORRHOIDECTOMY (N/A Rectum) BIOPSY RECTAL (N/A Rectum)  Patient Location: PACU  Anesthesia Type:General  Level of Consciousness: awake  Airway & Oxygen Therapy: Patient Spontanous Breathing  Post-op Assessment: Report given to RN  Post vital signs: Reviewed  Last Vitals:  Vitals Value Taken Time  BP 165/73 09/16/19 0822  Temp    Pulse 95 09/16/19 0823  Resp 18 09/16/19 0823  SpO2 100 % 09/16/19 0823  Vitals shown include unvalidated device data.  Last Pain:  Vitals:   09/16/19 0645  PainSc: 7          Complications: No apparent anesthesia complications

## 2019-09-16 NOTE — Anesthesia Postprocedure Evaluation (Signed)
Anesthesia Post Note  Patient: Rhonda Bailey  Procedure(s) Performed: EXTENSIVE HEMORRHOIDECTOMY (N/A Rectum) BIOPSY RECTAL (N/A Rectum)  Patient location during evaluation: PACU Anesthesia Type: General Level of consciousness: awake and alert and oriented Pain management: pain level controlled Vital Signs Assessment: post-procedure vital signs reviewed and stable Respiratory status: spontaneous breathing Cardiovascular status: blood pressure returned to baseline and stable Postop Assessment: no apparent nausea or vomiting Anesthetic complications: no     Last Vitals:  Vitals:   09/16/19 0915 09/16/19 0943  BP: (!) 161/84 (!) 146/67  Pulse: 95 92  Resp: 20 20  Temp:  36.7 C  SpO2: 97% 94%    Last Pain:  Vitals:   09/16/19 0943  TempSrc: Oral  PainSc: 4                  Gena Laski

## 2019-09-16 NOTE — Progress Notes (Signed)
Procedure : Hemorrhoidectomy Rectal tampon removed without incident, fully intact : 0918, pt tolerated well

## 2019-09-16 NOTE — Discharge Instructions (Signed)
Start Sitz bath in three days    How to Take a [employer] A sitz bath is a warm water bath that may be used to care for your rectum, genital area, or the area between your rectum and genitals (perineum). For a sitz bath, the water only comes up to your hips and covers your buttocks. A sitz bath may done at home in a bathtub or with a portable sitz bath that fits over the toilet. Your health care provider may recommend a sitz bath to help:  Relieve pain and discomfort after delivering a baby.  Relieve pain and itching from hemorrhoids or anal fissures.  Relieve pain after certain surgeries.  Relax muscles that are sore or tight. How to take a sitz bath Take 3-4 sitz baths a day, or as many as told by your health care provider. Bathtub sitz bath To take a sitz bath in a bathtub: 1. Partially fill a bathtub with warm water. The water should be deep enough to cover your hips and buttocks when you are sitting in the tub. 2. If your health care provider told you to put medicine in the water, follow his or her instructions. 3. Sit in the water. 4. Open the tub drain a little, and leave it open during your bath. 5. Turn on the warm water again, enough to replace the water that is draining out. Keep the water running throughout your bath. This helps keep the water at the right level and the right temperature. 6. Soak in the water for 15-20 minutes, or as long as told by your health care provider. 7. When you are done, be careful when you stand up. You may feel dizzy. 8. After the sitz bath, pat yourself dry. Do not rub your skin to dry it.  Over-the-toilet sitz bath To take a sitz bath with an over-the-toilet basin: 1. Follow the manufacturer's instructions. 2. Fill the basin with warm water. 3. If your health care provider told you to put medicine in the water, follow his or her instructions. 4. Sit on the seat. Make sure the water covers your buttocks and perineum. 5. Soak in the water  for 15-20 minutes, or as long as told by your health care provider. 6. After the sitz bath, pat yourself dry. Do not rub your skin to dry it. 7. Clean and dry the basin between uses. 8. Discard the basin if it cracks, or according to the manufacturer's instructions. Contact a health care provider if:  Your symptoms get worse. Do not continue with sitz baths if your symptoms get worse.  You have new symptoms. If this happens, do not continue with sitz baths until you talk with your health care provider. Summary  A sitz bath is a warm water bath in which the water only comes up to your hips and covers your buttocks.  A sitz bath may help relieve itching, relieve pain, and relax muscles that are sore or tight in the lower part of your body, including your genital area.  Take 3-4 sitz baths a day, or as many as told by your health care provider. Soak in the water for 15-20 minutes.  Do not continue with sitz baths if your symptoms get worse. This information is not intended to replace advice given to you by your health care provider. Make sure you discuss any questions you have with your health care provider. Document Revised: 12/23/2018 Document Reviewed: 07/26/2017 Elsevier Patient Education  [place].  Surgical Procedures for  Hemorrhoids, Care After This sheet gives you information about how to care for yourself after your procedure. Your health care provider may also give you more specific instructions. If you have problems or questions, contact your health care provider. What can I expect after the procedure? After the procedure, it is common to have:  Rectal pain.  Pain when you are having a bowel movement.  Slight rectal bleeding. This is more likely to happen with the first bowel movement after surgery. Follow these instructions at home: Medicines  Take over-the-counter and prescription medicines only as told by your health care provider.  If you were prescribed an  antibiotic medicine, use it as told by your health care provider. Do not stop using the antibiotic even if your condition improves.  Ask your health care provider if the medicine prescribed to you requires you to avoid driving or using heavy machinery.  Use a stool softener or a bulk laxative as told by your health care provider. Eating and drinking  Follow instructions from your health care provider about what to eat or drink after your procedure.  You may need to take actions to prevent or treat constipation, such as: ? Drink enough fluid to keep your urine pale yellow. ? Take over-the-counter or prescription medicines. ? Eat foods that are high in fiber, such as beans, whole grains, and fresh fruits and vegetables. ? Limit foods that are high in fat and processed sugars, such as fried or sweet foods. Activity   Rest as told by your health care provider.  Avoid sitting for a long time without moving. Get up to take short walks every 1-2 hours. This is important to improve blood flow and breathing. Ask for help if you feel weak or unsteady.  Return to your normal activities as told by your health care provider. Ask your health care provider what activities are safe for you.  Do not lift anything that is heavier than 10 lb (4.5 kg), or the limit that you are told, until your health care provider says that it is safe.  Do not strain to have a bowel movement.  Do not spend a long time sitting on the toilet. General instructions   Take warm sitz baths for 15-20 minutes, 2-3 times a day to relieve soreness or itching and to keep the rectal area clean.  Apply ice packs to the area to reduce swelling and pain.  Do not drive for 24 hours if you were given a sedative during your procedure.  Keep all follow-up visits as told by your health care provider. This is important. Contact a health care provider if:  Your pain medicine is not helping.  You have a fever or chills.  You have  bad smelling drainage.  You have a lot of swelling.  You become constipated.  You have trouble passing urine. Get help right away if:  You have very bad rectal pain.  You have heavy bleeding from your rectum. Summary  After the procedure, it is common to have pain and slight rectal bleeding.  Take warm sitz baths for 15-20 minutes, 2-3 times a day to relieve soreness or itching and to keep the rectal area clean.  Avoid straining when having a bowel movement.  Eat foods that are high in fiber, such as beans, whole grains, and fresh fruits and vegetables.  Take over-the-counter and prescription medicines only as told by your health care provider. This information is not intended to replace advice given to you by  your health care provider. Make sure you discuss any questions you have with your health care provider. Document Revised: 01/08/2019 Document Reviewed: 06/11/2018 Elsevier Patient Education  2020 [place] Anesthesia, Adult, Care After This sheet gives you information about how to care for yourself after your procedure. Your health care provider may also give you more specific instructions. If you have problems or questions, contact your health care provider. What can I expect after the procedure? After the procedure, the following side effects are common:  Pain or discomfort at the IV site.  Nausea.  Vomiting.  Sore throat.  Trouble concentrating.  Feeling cold or chills.  Weak or tired.  Sleepiness and fatigue.  Soreness and body aches. These side effects can affect parts of the body that were not involved in surgery. Follow these instructions at home:  For at least 24 hours after the procedure:  Have a responsible adult stay with you. It is important to have someone help care for you until you are awake and alert.  Rest as needed.  Do not: ? Participate in activities in which you could fall or become injured. ? Drive. ? Use heavy  machinery. ? Drink alcohol. ? Take sleeping pills or medicines that cause drowsiness. ? Make important decisions or sign legal documents. ? Take care of children on your own. Eating and drinking  Follow any instructions from your health care provider about eating or drinking restrictions.  When you feel hungry, start by eating small amounts of foods that are soft and easy to digest (bland), such as toast. Gradually return to your regular diet.  Drink enough fluid to keep your urine pale yellow.  If you vomit, rehydrate by drinking water, juice, or clear broth. General instructions  If you have sleep apnea, surgery and certain medicines can increase your risk for breathing problems. Follow instructions from your health care provider about wearing your sleep device: ? Anytime you are sleeping, including during daytime naps. ? While taking prescription pain medicines, sleeping medicines, or medicines that make you drowsy.  Return to your normal activities as told by your health care provider. Ask your health care provider what activities are safe for you.  Take over-the-counter and prescription medicines only as told by your health care provider.  If you smoke, do not smoke without supervision.  Keep all follow-up visits as told by your health care provider. This is important. Contact a health care provider if:  You have nausea or vomiting that does not get better with medicine.  You cannot eat or drink without vomiting.  You have pain that does not get better with medicine.  You are unable to pass urine.  You develop a skin rash.  You have a fever.  You have redness around your IV site that gets worse. Get help right away if:  You have difficulty breathing.  You have chest pain.  You have blood in your urine or stool, or you vomit blood. Summary  After the procedure, it is common to have a sore throat or nausea. It is also common to feel tired.  Have a responsible  adult stay with you for the first 24 hours after general anesthesia. It is important to have someone help care for you until you are awake and alert.  When you feel hungry, start by eating small amounts of foods that are soft and easy to digest (bland), such as toast. Gradually return to your regular diet.  Drink enough fluid to keep  your urine pale yellow.  Return to your normal activities as told by your health care provider. Ask your health care provider what activities are safe for you. This information is not intended to replace advice given to you by your health care provider. Make sure you discuss any questions you have with your health care provider. Document Revised: 07/27/2017 Document Reviewed: 03/09/2017 Elsevier Patient Education  [place].

## 2019-09-16 NOTE — Anesthesia Procedure Notes (Signed)
Procedure Name: LMA Insertion Date/Time: 09/16/2019 7:41 AM Performed by: Ollen Bowl, CRNA Pre-anesthesia Checklist: Patient identified, Patient being monitored, Emergency Drugs available, Timeout performed and Suction available Patient Re-evaluated:Patient Re-evaluated prior to induction Oxygen Delivery Method: Circle System Utilized Preoxygenation: Pre-oxygenation with 100% oxygen Induction Type: IV induction Ventilation: Mask ventilation without difficulty LMA: LMA inserted LMA Size: 4.0 Number of attempts: 1 Placement Confirmation: positive ETCO2 and breath sounds checked- equal and bilateral

## 2019-09-17 ENCOUNTER — Ambulatory Visit: Payer: Medicare HMO | Admitting: Gastroenterology

## 2019-09-17 ENCOUNTER — Ambulatory Visit: Payer: Medicare HMO | Admitting: Urology

## 2019-09-17 LAB — SURGICAL PATHOLOGY

## 2019-09-22 ENCOUNTER — Encounter: Payer: Self-pay | Admitting: Family Medicine

## 2019-09-22 ENCOUNTER — Ambulatory Visit (INDEPENDENT_AMBULATORY_CARE_PROVIDER_SITE_OTHER): Payer: Medicare HMO | Admitting: Family Medicine

## 2019-09-22 ENCOUNTER — Telehealth: Payer: Self-pay

## 2019-09-22 ENCOUNTER — Other Ambulatory Visit: Payer: Self-pay

## 2019-09-22 VITALS — BP 160/82 | HR 94 | Temp 97.8°F | Ht 68.0 in | Wt 164.4 lb

## 2019-09-22 DIAGNOSIS — K649 Unspecified hemorrhoids: Secondary | ICD-10-CM

## 2019-09-22 DIAGNOSIS — F419 Anxiety disorder, unspecified: Secondary | ICD-10-CM

## 2019-09-22 NOTE — Telephone Encounter (Signed)
Called and [facility] that patient had requested to call me.

## 2019-09-22 NOTE — Telephone Encounter (Signed)
Daughter upset with care received today.  She said she was going to find a PCP in [place] and I told her I would be happy to help her.  I gave her my office phone number at [facility].  She is going to the surgeon tomorrow and will follow back up.  She said she will be be going back to Dr Holly Bodily.

## 2019-09-22 NOTE — Progress Notes (Signed)
New Patient Office Visit  Subjective:  Patient ID: Rhonda Bailey, female    DOB: October 05, 1945  Age: 74 y.o. MRN: [id number]  CC:  Chief Complaint  Patient presents with  . Establish Care  . Weight Loss    concerns with loss of 25 pounds  Prescriptions Total Prescriptions: 7   Total Private Pay: 1   Fill Date ID   Written Drug Qty Days Prescriber Rx # Pharmacy Refill   Daily Dose* Pymt Type PMP    09/16/2019  1   09/16/2019  Hydrocodone-Acetamin 5-325 MG  25.00  7 Ma Jen   [id number]   [place] ([id number])   0  17.86 MME  Medicare   [place]  06/11/2019  1   06/11/2019  Hydrocodone-Acetamin 5-325 MG  30.00  8 Pa Mck   [id number]   [place] ([id number])   0  18.75 MME  Private Pay   [place]  05/27/2019  1   05/27/2019  Alprazolam 0.5 MG Tablet  21.00  7 Ed Haw   [id number]   [place] ([id number])   0  3.00 LME  Medicare   [place]  05/11/2019  1   05/11/2019  Hydrocodone-Acetamin 5-325 MG  12.00  3 Ca Mad   [id number]   [place] ([id number])   0  20.00 MME  Medicare   [place]  05/11/2019  1   05/11/2019  Tramadol Hcl 50 MG Tablet  12.00  3 Ca Mad   [id number]   [place] ([id number])   0  20.00 MME  Medicare   [place]  05/05/2019  1   05/05/2019  Tramadol Hcl 50 MG Tablet  20.00  3 Sh T   [id number]   [place] ([id number])   0  33.33 MME  Medicare   [place]  09/26/2018  1   09/26/2018  Alprazolam 0.5 MG Tablet  21.00  7 Ed Haw   [id number]   [place] ([id number])   0  3.00 LME  Medicare        HPI Rhonda Bailey  presents for concerns about itching related to surgical site-follow up from hemorrhoid -surgery completed by Dr. Arnoldo Morale scheduled for tomorrow. Pt reports she need to see   Dr Jonita Albee due to -more extensive hemorrhoids than Dr. Arnoldo Morale can manage. Pt with surgery 09/16/19.pt with difficulty sleeping due to surgery request Xanax for sleep. Given by Dr. Luan Pulling previously  Past Medical History:  Diagnosis Date  . Anxiety   . Anxiety disorder, unspecified   . Essential (primary) hypertension   . Gastro-esophageal reflux disease with esophagitis   . Hypokalemia   . Hypothyroidism   .  Noninfective gastroenteritis and colitis, unspecified     Past Surgical History:  Procedure Laterality Date  . BIOPSY  09/02/2018   Procedure: BIOPSY;  Surgeon: Daneil Dolin, MD;  [facility];  Service: Endoscopy;;  colon   . CHOLECYSTECTOMY  05/19/2011   Procedure: LAPAROSCOPIC CHOLECYSTECTOMY;  Surgeon: Jamesetta So;  Location: [facility];  Service: General;  Laterality: N/A;  . COLONOSCOPY WITH PROPOFOL N/A 09/02/2018   Dr. Gala Romney: Proctocolitis limited to the left side with some exudate versus partial pseudomembrane formation, erosions and ulceration somewhat superficial.  Terminal ileum normal.  Diverticulosis.  Descending/sigmoid colon biopsy showing severe active colitis, diagnostic features of IBD not identified.  Differential includes infection, ischemia.  Rectal biopsies with granulation tissue c/w ulcer  . CYSTOSCOPY W/ URETERAL STENT PLACEMENT Right 05/08/2019   Procedure: CYSTOSCOPY WITH RETROGRADE PYELOGRAM/URETERAL STENT PLACEMENT;  Surgeon: Junious Silk,  Rodman Key, MD;  Location: [facility];  Service: Urology;  Laterality: Right;  . CYSTOSCOPY WITH URETEROSCOPY Right 06/11/2019   Procedure: CYSTOSCOPY WITH DIAGNOSTIC URETEROSCOPY; URETERAL STENT REMOVAL;  Surgeon: Cleon Gustin, MD;  Location: [facility];  Service: Urology;  Laterality: Right;  . HEMORRHOID SURGERY N/A 09/16/2019   Procedure: EXTENSIVE HEMORRHOIDECTOMY;  Surgeon: Aviva Signs, MD;  Location: [facility];  Service: General;  Laterality: N/A;  . RECTAL BIOPSY N/A 09/16/2019   Procedure: BIOPSY RECTAL;  Surgeon: Aviva Signs, MD;  Location: [facility];  Service: General;  Laterality: N/A;  . TUBAL LIGATION      Family History  Problem Relation Age of Onset  . Cancer Father   . Heart disease Sister   . Heart disease Brother   . Cancer Brother   . Anesthesia problems Neg Hx   . Hypotension Neg Hx   . Malignant hyperthermia Neg Hx   . Pseudochol deficiency Neg Hx     Social History   Socioeconomic History  .  Marital status: Divorced    Spouse name: Not on file  . Number of children: Not on file  . Years of education: Not on file  . Highest education level: Not on file  Occupational History  . Not on file  Tobacco Use  . Smoking status: Never Smoker  . Smokeless tobacco: Never Used  Substance and Sexual Activity  . Alcohol use: No  . Drug use: No  . Sexual activity: Not Currently  Other Topics Concern  . Not on file  Social History Narrative  . Not on file   Social Determinants of Health   Financial Resource Strain: Low Risk   . Difficulty of Paying Living Expenses: Not hard at all  Food Insecurity: No Food Insecurity  . Worried About Charity fundraiser in the Last Year: Never true  . Ran Out of Food in the Last Year: Never true  Transportation Needs: No Transportation Needs  . Lack of Transportation (Medical): No  . Lack of Transportation (Non-Medical): No  Physical Activity: Sufficiently Active  . Days of Exercise per Week: 5 days  . Minutes of Exercise per Session: 30 min  Stress: No Stress Concern Present  . Feeling of Stress : Not at all  Social Connections: Moderately Isolated  . Frequency of Communication with Friends and Family: More than three times a week  . Frequency of Social Gatherings with Friends and Family: More than three times a week  . Attends Religious Services: Never  . Active Member of Clubs or Organizations: No  . Attends Archivist Meetings: Never  . Marital Status: Divorced  Human resources officer Violence: Not At Risk  . Fear of Current or Ex-Partner: No  . Emotionally Abused: No  . Physically Abused: No  . Sexually Abused: No    ROS Review of Systems  Objective:   Today's Vitals: BP (!) 160/82 (BP Location: Right Arm, Patient Position: Sitting)   Pulse 94   Temp 97.8 F (36.6 C) (Oral)   Ht 5' 8"  (1.727 m)   Wt 164 lb 6.4 oz (74.6 kg)   SpO2 98%   BMI 25.00 kg/m   Physical Exam-pt left prior to examination  Assessment & Plan:    1. Hemorrhoids, unspecified hemorrhoid type D/w pt post operative Dr. Arnoldo Morale would address application of medication requested to surgical site-keep appointment for follow up tomorrow. Call his office if concerns need to be addressed prior to appointment.  No fever. No pain-itching  2.  Anxiety disorder, unspecified type Pt took Xanax in the past for anxiety and sleep-d/w pt would not be able to write for Xanax for sleep Outpatient Encounter Medications as of 09/22/2019  Medication Sig  . acidophilus (RISAQUAD) CAPS capsule Take 1 capsule by mouth daily.  Marland Kitchen ALPRAZolam (XANAX) 0.5 MG tablet Take 0.5 mg by mouth at bedtime as needed for anxiety or sleep.   Marland Kitchen HYDROcodone-acetaminophen (NORCO) 5-325 MG tablet Take 1 tablet by mouth every 6 (six) hours as needed for moderate pain.  . hydrocortisone (ANUSOL-HC) 2.5 % rectal cream Place 1 application rectally 2 (two) times daily. [place] [facility] HEMOR[facility]AM  . ibuprofen (ADVIL) 200 MG tablet Take 200-400 mg by mouth every 6 (six) hours as needed for moderate pain.  Marland Kitchen levothyroxine (SYNTHROID, LEVOTHROID) 25 MCG tablet Take 25 mcg by mouth daily before breakfast.   . pantoprazole (PROTONIX) 40 MG tablet Take 40 mg by mouth at bedtime.   . traZODone (DESYREL) 50 MG tablet Take 50 mg by mouth at bedtime as needed for sleep.   Marland Kitchen ondansetron (ZOFRAN ODT) 8 MG disintegrating tablet Take 1 tablet (8 mg total) by mouth every 8 (eight) hours as needed for nausea or vomiting. (Patient not taking: Reported on 08/14/2019)   No facility-administered encounter medications on file as of 09/22/2019.   Pt became angry when told Xanax would not be prescribed for the treatment of insomnia and post operative care by surgery.  Pt stated "if you can't take care of me, I will find another doctor" Follow-up: pt will not follow up at this practice for primary care  Analissa Bayless Hannah Beat, MD

## 2019-09-23 ENCOUNTER — Ambulatory Visit (INDEPENDENT_AMBULATORY_CARE_PROVIDER_SITE_OTHER): Payer: Self-pay | Admitting: General Surgery

## 2019-09-23 VITALS — BP 160/81 | HR 95 | Temp 98.2°F | Ht 68.0 in | Wt 130.0 lb

## 2019-09-23 DIAGNOSIS — Z09 Encounter for follow-up examination after completed treatment for conditions other than malignant neoplasm: Secondary | ICD-10-CM

## 2019-09-24 ENCOUNTER — Encounter: Payer: Self-pay | Admitting: General Surgery

## 2019-09-24 NOTE — Progress Notes (Signed)
Subjective:     Rhonda Bailey  Here for postoperative visit. Patient still having burning sensation in rectum. Minimal rectal bleeding noted. Objective:    BP (!) 160/81   Pulse 95   Temp 98.2 F (36.8 C)   Ht 5' 8"  (1.727 m)   Wt 130 lb (59 kg)   BMI 19.77 kg/m   General:  alert, cooperative and no distress  Rectal examination reveals no active bleeding. Examination limited secondary to pain. Posterior anal fissure noted and Rectiv 5% lidocaine applied. She did have moderate relief of her pain. Final pathology of rectal biopsies were negative for malignancy     Assessment:    Status post hemorrhoidectomy with biopsy of rectal ulceration/fissure.    Plan:   Patient's family has already contacted Dr. Leighton Ruff, colorectal surgery at [facility]. I agree with this referral. A prescription for Gladstone Lighter has been prescribed. Patient will follow up with me in 1 week.

## 2019-09-29 ENCOUNTER — Telehealth: Payer: Self-pay

## 2019-09-29 NOTE — Telephone Encounter (Signed)
Returned call -Patient has appointment with Rennis Chris 7/41/2878 at 9:10 pm. Patient told to arrive at 9:10 to complete paperwork.   Dr.Jenkins notified.

## 2019-09-30 DIAGNOSIS — K6289 Other specified diseases of anus and rectum: Secondary | ICD-10-CM | POA: Diagnosis not present

## 2019-10-02 ENCOUNTER — Telehealth: Payer: Self-pay

## 2019-10-02 NOTE — Telephone Encounter (Signed)
Patient would like refill on recta care- also patient stated she is having anxiety attacks at night due to her rectum. She was provided Dr Benny Lennert number to schedule appointment with PCP- patient stated she did not have PCP.  Message sent to Dr.Jenkins.

## 2019-10-02 NOTE — Telephone Encounter (Signed)
No refills at this time- patient instructed to use sparingly. Per Dr.Jenkins.

## 2019-10-13 ENCOUNTER — Encounter (HOSPITAL_BASED_OUTPATIENT_CLINIC_OR_DEPARTMENT_OTHER): Payer: Self-pay

## 2019-10-13 ENCOUNTER — Inpatient Hospital Stay (HOSPITAL_BASED_OUTPATIENT_CLINIC_OR_DEPARTMENT_OTHER)
Admission: EM | Admit: 2019-10-13 | Discharge: 2019-10-16 | DRG: 385 | Disposition: A | Discharge status: Home / Self Care | Payer: Medicare HMO | Attending: Internal Medicine | Admitting: Internal Medicine

## 2019-10-13 ENCOUNTER — Other Ambulatory Visit: Payer: Self-pay

## 2019-10-13 ENCOUNTER — Emergency Department (HOSPITAL_BASED_OUTPATIENT_CLINIC_OR_DEPARTMENT_OTHER): Payer: Medicare HMO

## 2019-10-13 DIAGNOSIS — I10 Essential (primary) hypertension: Secondary | ICD-10-CM | POA: Diagnosis present

## 2019-10-13 DIAGNOSIS — D638 Anemia in other chronic diseases classified elsewhere: Secondary | ICD-10-CM | POA: Diagnosis present

## 2019-10-13 DIAGNOSIS — K219 Gastro-esophageal reflux disease without esophagitis: Secondary | ICD-10-CM | POA: Diagnosis present

## 2019-10-13 DIAGNOSIS — E039 Hypothyroidism, unspecified: Secondary | ICD-10-CM | POA: Diagnosis not present

## 2019-10-13 DIAGNOSIS — D509 Iron deficiency anemia, unspecified: Secondary | ICD-10-CM | POA: Diagnosis present

## 2019-10-13 DIAGNOSIS — G4700 Insomnia, unspecified: Secondary | ICD-10-CM | POA: Diagnosis present

## 2019-10-13 DIAGNOSIS — Z79890 Hormone replacement therapy (postmenopausal): Secondary | ICD-10-CM | POA: Diagnosis not present

## 2019-10-13 DIAGNOSIS — Z885 Allergy status to narcotic agent status: Secondary | ICD-10-CM

## 2019-10-13 DIAGNOSIS — E278 Other specified disorders of adrenal gland: Secondary | ICD-10-CM

## 2019-10-13 DIAGNOSIS — Z79899 Other long term (current) drug therapy: Secondary | ICD-10-CM | POA: Diagnosis not present

## 2019-10-13 DIAGNOSIS — K612 Anorectal abscess: Secondary | ICD-10-CM | POA: Diagnosis not present

## 2019-10-13 DIAGNOSIS — Z8249 Family history of ischemic heart disease and other diseases of the circulatory system: Secondary | ICD-10-CM

## 2019-10-13 DIAGNOSIS — E279 Disorder of adrenal gland, unspecified: Secondary | ICD-10-CM | POA: Diagnosis not present

## 2019-10-13 DIAGNOSIS — K632 Fistula of intestine: Secondary | ICD-10-CM | POA: Diagnosis not present

## 2019-10-13 DIAGNOSIS — Z87442 Personal history of urinary calculi: Secondary | ICD-10-CM | POA: Diagnosis not present

## 2019-10-13 DIAGNOSIS — U071 COVID-19: Secondary | ICD-10-CM | POA: Diagnosis not present

## 2019-10-13 DIAGNOSIS — R935 Abnormal findings on diagnostic imaging of other abdominal regions, including retroperitoneum: Secondary | ICD-10-CM | POA: Diagnosis not present

## 2019-10-13 DIAGNOSIS — K51518 Left sided colitis with other complication: Secondary | ICD-10-CM | POA: Diagnosis not present

## 2019-10-13 DIAGNOSIS — K1379 Other lesions of oral mucosa: Secondary | ICD-10-CM | POA: Diagnosis not present

## 2019-10-13 DIAGNOSIS — J029 Acute pharyngitis, unspecified: Secondary | ICD-10-CM | POA: Diagnosis not present

## 2019-10-13 DIAGNOSIS — R197 Diarrhea, unspecified: Secondary | ICD-10-CM

## 2019-10-13 DIAGNOSIS — D500 Iron deficiency anemia secondary to blood loss (chronic): Secondary | ICD-10-CM | POA: Diagnosis not present

## 2019-10-13 DIAGNOSIS — K5790 Diverticulosis of intestine, part unspecified, without perforation or abscess without bleeding: Secondary | ICD-10-CM | POA: Diagnosis present

## 2019-10-13 DIAGNOSIS — F419 Anxiety disorder, unspecified: Secondary | ICD-10-CM | POA: Diagnosis present

## 2019-10-13 DIAGNOSIS — E876 Hypokalemia: Secondary | ICD-10-CM | POA: Diagnosis not present

## 2019-10-13 DIAGNOSIS — K603 Anal fistula: Secondary | ICD-10-CM | POA: Diagnosis not present

## 2019-10-13 DIAGNOSIS — K529 Noninfective gastroenteritis and colitis, unspecified: Secondary | ICD-10-CM | POA: Diagnosis not present

## 2019-10-13 DIAGNOSIS — I82890 Acute embolism and thrombosis of other specified veins: Secondary | ICD-10-CM

## 2019-10-13 LAB — COMPREHENSIVE METABOLIC PANEL
ALT: 9 U/L (ref 0–44)
AST: 12 U/L — ABNORMAL LOW (ref 15–41)
Albumin: 2.6 g/dL — ABNORMAL LOW (ref 3.5–5.0)
Alkaline Phosphatase: 74 U/L (ref 38–126)
Anion gap: 9 (ref 5–15)
BUN: 9 mg/dL (ref 8–23)
CO2: 26 mmol/L (ref 22–32)
Calcium: 8.3 mg/dL — ABNORMAL LOW (ref 8.9–10.3)
Chloride: 104 mmol/L (ref 98–111)
Creatinine, Ser: 0.96 mg/dL (ref 0.44–1.00)
GFR calc Af Amer: >60 mL/min (ref >60)
GFR calc non Af Amer: 58 mL/min — ABNORMAL LOW (ref >60)
Glucose, Bld: 107 mg/dL — ABNORMAL HIGH (ref 70–99)
Potassium: 3 mmol/L — ABNORMAL LOW (ref 3.5–5.1)
Sodium: 139 mmol/L (ref 135–145)
Total Bilirubin: 0.6 mg/dL (ref 0.3–1.2)
Total Protein: 6.5 g/dL (ref 6.5–8.1)

## 2019-10-13 LAB — URINALYSIS, ROUTINE W REFLEX MICROSCOPIC
Bilirubin Urine: NEGATIVE
Glucose, UA: NEGATIVE mg/dL
Hgb urine dipstick: NEGATIVE
Ketones, ur: NEGATIVE mg/dL
Leukocytes,Ua: NEGATIVE
Nitrite: NEGATIVE
Protein, ur: NEGATIVE mg/dL
Specific Gravity, Urine: <1.005 — ABNORMAL LOW (ref 1.005–1.030)
pH: 7 (ref 5.0–8.0)

## 2019-10-13 LAB — CBC WITH DIFFERENTIAL/PLATELET
Abs Immature Granulocytes: 0.13 10*3/uL — ABNORMAL HIGH (ref 0.00–0.07)
Basophils Absolute: 0.1 10*3/uL (ref 0.0–0.1)
Basophils Relative: 1 %
Eosinophils Absolute: 0.4 10*3/uL (ref 0.0–0.5)
Eosinophils Relative: 3 %
HCT: 30.1 % — ABNORMAL LOW (ref 36.0–46.0)
Hemoglobin: 9.1 g/dL — ABNORMAL LOW (ref 12.0–15.0)
Immature Granulocytes: 1 %
Lymphocytes Relative: 12 %
Lymphs Abs: 1.8 10*3/uL (ref 0.7–4.0)
MCH: 22 pg — ABNORMAL LOW (ref 26.0–34.0)
MCHC: 30.2 g/dL (ref 30.0–36.0)
MCV: 72.9 fL — ABNORMAL LOW (ref 80.0–100.0)
Monocytes Absolute: 1.1 10*3/uL — ABNORMAL HIGH (ref 0.1–1.0)
Monocytes Relative: 7 %
Neutro Abs: 11.9 10*3/uL — ABNORMAL HIGH (ref 1.7–7.7)
Neutrophils Relative %: 76 %
Platelets: 502 10*3/uL — ABNORMAL HIGH (ref 150–400)
RBC: 4.13 MIL/uL (ref 3.87–5.11)
RDW: 18.5 % — ABNORMAL HIGH (ref 11.5–15.5)
WBC: 15.5 10*3/uL — ABNORMAL HIGH (ref 4.0–10.5)
nRBC: 0 % (ref 0.0–0.2)

## 2019-10-13 LAB — LIPASE, BLOOD: Lipase: 20 U/L (ref 11–51)

## 2019-10-13 LAB — LACTIC ACID, PLASMA: Lactic Acid, Venous: 1.5 mmol/L (ref 0.5–1.9)

## 2019-10-13 MED ORDER — PIPERACILLIN-TAZOBACTAM 3.375 G IVPB
3.3750 g | Freq: Three times a day (TID) | INTRAVENOUS | Status: DC
  Administered 2019-10-13 – 2019-10-14 (×3): 3.375 g via INTRAVENOUS
  Filled 2019-10-13 (×4): qty 50

## 2019-10-13 MED ORDER — DEXTROSE-NACL 5-0.9 % IV SOLN: INTRAVENOUS | Status: AC

## 2019-10-13 MED ORDER — MORPHINE SULFATE (PF) 4 MG/ML IV SOLN
4.0000 mg | Freq: Once | INTRAVENOUS | Status: AC
  Administered 2019-10-13: 4 mg via INTRAVENOUS
  Filled 2019-10-13: qty 1

## 2019-10-13 MED ORDER — IOHEXOL 300 MG/ML  SOLN
100.0000 mL | Freq: Once | INTRAMUSCULAR | Status: AC | PRN
  Administered 2019-10-13: 100 mL via INTRAVENOUS

## 2019-10-13 MED ORDER — PANTOPRAZOLE SODIUM 40 MG PO TBEC
40.0000 mg | DELAYED_RELEASE_TABLET | Freq: Every day | ORAL | Status: DC
  Administered 2019-10-13 – 2019-10-15 (×3): 40 mg via ORAL
  Filled 2019-10-13 (×3): qty 1

## 2019-10-13 MED ORDER — HYDROCODONE-ACETAMINOPHEN 5-325 MG PO TABS
1.0000 | ORAL_TABLET | Freq: Four times a day (QID) | ORAL | Status: DC | PRN
  Administered 2019-10-14 – 2019-10-16 (×5): 1 via ORAL
  Filled 2019-10-13 (×5): qty 1

## 2019-10-13 MED ORDER — ALPRAZOLAM 0.5 MG PO TABS: 0.5000 mg | ORAL_TABLET | Freq: Every evening | ORAL | Status: DC | PRN

## 2019-10-13 MED ORDER — ACETAMINOPHEN 325 MG PO TABS
650.0000 mg | ORAL_TABLET | Freq: Four times a day (QID) | ORAL | Status: DC | PRN
  Administered 2019-10-13 – 2019-10-15 (×3): 650 mg via ORAL
  Filled 2019-10-13 (×3): qty 2

## 2019-10-13 MED ORDER — POTASSIUM CHLORIDE CRYS ER 20 MEQ PO TBCR
40.0000 meq | EXTENDED_RELEASE_TABLET | Freq: Once | ORAL | Status: AC
  Administered 2019-10-13: 40 meq via ORAL
  Filled 2019-10-13: qty 2

## 2019-10-13 MED ORDER — TRAZODONE HCL 50 MG PO TABS
50.0000 mg | ORAL_TABLET | Freq: Every evening | ORAL | Status: DC | PRN
  Administered 2019-10-13 – 2019-10-15 (×3): 50 mg via ORAL
  Filled 2019-10-13 (×3): qty 1

## 2019-10-13 MED ORDER — PIPERACILLIN-TAZOBACTAM IN DEX 2-0.25 GM/50ML IV SOLN
2.2500 g | Freq: Once | INTRAVENOUS | Status: DC
  Filled 2019-10-13: qty 50

## 2019-10-13 MED ORDER — LORAZEPAM 1 MG PO TABS
0.5000 mg | ORAL_TABLET | Freq: Once | ORAL | Status: AC
  Administered 2019-10-13: 0.5 mg via ORAL
  Filled 2019-10-13: qty 1

## 2019-10-13 MED ORDER — ACETAMINOPHEN 650 MG RE SUPP: 650.0000 mg | Freq: Four times a day (QID) | RECTAL | Status: DC | PRN

## 2019-10-13 MED ORDER — ONDANSETRON HCL 4 MG/2ML IJ SOLN
4.0000 mg | Freq: Once | INTRAMUSCULAR | Status: AC
  Administered 2019-10-13: 4 mg via INTRAVENOUS
  Filled 2019-10-13: qty 2

## 2019-10-13 MED ORDER — ONDANSETRON HCL 4 MG PO TABS: 4.0000 mg | ORAL_TABLET | Freq: Four times a day (QID) | ORAL | Status: DC | PRN

## 2019-10-13 MED ORDER — PIPERACILLIN-TAZOBACTAM 3.375 G IVPB
3.3750 g | Freq: Once | INTRAVENOUS | Status: AC
  Administered 2019-10-13: 3.375 g via INTRAVENOUS
  Filled 2019-10-13: qty 50

## 2019-10-13 MED ORDER — ONDANSETRON HCL 4 MG/2ML IJ SOLN
4.0000 mg | Freq: Four times a day (QID) | INTRAMUSCULAR | Status: DC | PRN
  Administered 2019-10-15: 4 mg via INTRAVENOUS
  Filled 2019-10-13: qty 2

## 2019-10-13 MED ORDER — SODIUM CHLORIDE 0.9 % IV SOLN: Freq: Once | INTRAVENOUS | Status: AC

## 2019-10-13 MED ORDER — LEVOTHYROXINE SODIUM 25 MCG PO TABS
25.0000 ug | ORAL_TABLET | Freq: Every day | ORAL | Status: DC
  Administered 2019-10-14 – 2019-10-16 (×3): 25 ug via ORAL
  Filled 2019-10-13 (×3): qty 1

## 2019-10-13 MED ORDER — RISAQUAD PO CAPS
1.0000 | ORAL_CAPSULE | Freq: Every day | ORAL | Status: DC
  Administered 2019-10-13 – 2019-10-15 (×3): 1 via ORAL
  Filled 2019-10-13 (×3): qty 1

## 2019-10-13 NOTE — ED Notes (Signed)
Report provided for [facility], ETA 15 min for pick up for transport to [facility]

## 2019-10-13 NOTE — ED Notes (Signed)
Pt reports increased anxiety, new order for ativan per provider. Given prior to transfer.  Pt transferred out via [facility]

## 2019-10-13 NOTE — ED Triage Notes (Signed)
Pt arrives to ED with diarrhea, taken to bathroom and cleaned prior to coming to room. Pt reports she had a recent hemorrhoid operation and has been declining since having diarrhea and loosing weight.

## 2019-10-13 NOTE — H&P (Addendum)
History and Physical    VIANKA ERTEL QIW:[id number] DOB: 12/17/1945 DOA: 10/13/2019  PCP: [facility]  Patient coming from: Home.  Chief Complaint: Diarrhea.  HPI: SELAM PIETSCH is a 74 y.o. female with history of hypothyroidism who had recent hemorrhoidectomy on September 16, 2019 following which patient states she has been having persistent diarrhea at least about 10 episodes a day with some blood in it.  Also noticed that some discharge from the perianal area.  Sometimes bloody.  Denies any fever chills.  Has been having poor appetite at times vomiting.  Given the symptoms patient presents to the ER admits in [facility].  ED Course: CT abdomen pelvis done in the ER shows features concerning for colitis with possible sinus/fistulous tract around the perianal area.  ER physician discussed with on-call general surgery who advised GI consult and ER physician discussed with GI who advised patient to be placed on Zosyn and check stool studies for C. difficile.  Labs reveal hemoglobin is around 9.1 gets comparable to recent ONE.  WBC count of 15.5 platelets 417 complete metabolic panel shows mild hypokalemia.  Patient was started on Zosyn IV fluids and admitted for further management.  There was also finding of a right ovarian vein nonocclusive thrombus.  Review of Systems: As per HPI, rest all negative.   Past Medical History:  Diagnosis Date  . Anxiety   . Anxiety disorder, unspecified   . Essential (primary) hypertension   . Gastro-esophageal reflux disease with esophagitis   . Hypokalemia   . Hypothyroidism   . Noninfective gastroenteritis and colitis, unspecified     Past Surgical History:  Procedure Laterality Date  . BIOPSY  09/02/2018   Procedure: BIOPSY;  Surgeon: Daneil Dolin, MD;  [facility];  Service: Endoscopy;;  colon   . CHOLECYSTECTOMY  05/19/2011   Procedure: LAPAROSCOPIC CHOLECYSTECTOMY;  Surgeon: Jamesetta So;  Location: [facility];  Service: General;  Laterality: N/A;  . COLONOSCOPY WITH PROPOFOL N/A 09/02/2018   Dr. Gala Romney: Proctocolitis limited to the left side with some exudate versus partial pseudomembrane formation, erosions and ulceration somewhat superficial.  Terminal ileum normal.  Diverticulosis.  Descending/sigmoid colon biopsy showing severe active colitis, diagnostic features of IBD not identified.  Differential includes infection, ischemia.  Rectal biopsies with granulation tissue c/w ulcer  . CYSTOSCOPY W/ URETERAL STENT PLACEMENT Right 05/08/2019   Procedure: CYSTOSCOPY WITH RETROGRADE PYELOGRAM/URETERAL STENT PLACEMENT;  Surgeon: Festus Aloe, MD;  Location: [facility];  Service: Urology;  Laterality: Right;  . CYSTOSCOPY WITH URETEROSCOPY Right 06/11/2019   Procedure: CYSTOSCOPY WITH DIAGNOSTIC URETEROSCOPY; URETERAL STENT REMOVAL;  Surgeon: Cleon Gustin, MD;  Location: [facility];  Service: Urology;  Laterality: Right;  . HEMORRHOID SURGERY N/A 09/16/2019   Procedure: EXTENSIVE HEMORRHOIDECTOMY;  Surgeon: Aviva Signs, MD;  Location: [facility];  Service: General;  Laterality: N/A;  . RECTAL BIOPSY N/A 09/16/2019   Procedure: BIOPSY RECTAL;  Surgeon: Aviva Signs, MD;  Location: [facility];  Service: General;  Laterality: N/A;  . TUBAL LIGATION       reports that she has never smoked. She has never used smokeless tobacco. She reports that she does not drink alcohol or use drugs.  Allergies  Allergen Reactions  . Tramadol     Says "blew my head up."    Family History  Problem Relation Age of Onset  . Cancer Father   . Heart disease Sister   . Heart disease Brother   .  Cancer Brother   . Anesthesia problems Neg Hx   . Hypotension Neg Hx   . Malignant hyperthermia Neg Hx   . Pseudochol deficiency Neg Hx     Prior to Admission medications   Medication Sig Start Date End Date Taking? Authorizing Provider  acidophilus (RISAQUAD) CAPS capsule Take 1 capsule by mouth daily.   Yes [provider]  ALPRAZolam Duanne Moron) 0.5 MG tablet Take 0.5 mg by mouth at bedtime as needed for anxiety or sleep.  05/27/19  Yes [provider]  HYDROcodone-acetaminophen (NORCO) 5-325 MG tablet Take 1 tablet by mouth every 6 (six) hours as needed for moderate pain. 09/16/19  Yes Aviva Signs, MD  hydrocortisone (ANUSOL-HC) 2.5 % rectal cream Place 1 application rectally 2 (two) times daily. [place] [facility] HEMOR[facility]AM 07/29/19  Yes Annitta Needs, NP  ibuprofen (ADVIL) 200 MG tablet Take 200-400 mg by mouth every 6 (six) hours as needed for moderate pain.   Yes [provider]  levothyroxine (SYNTHROID, LEVOTHROID) 25 MCG tablet Take 25 mcg by mouth daily before breakfast.    Yes [provider]  pantoprazole (PROTONIX) 40 MG tablet Take 40 mg by mouth at bedtime.  08/17/18  Yes [provider]  traZODone (DESYREL) 50 MG tablet Take 50 mg by mouth at bedtime as needed for sleep.  08/25/18  Yes [provider]  ondansetron (ZOFRAN ODT) 8 MG disintegrating tablet Take 1 tablet (8 mg total) by mouth every 8 (eight) hours as needed for nausea or vomiting. Patient not taking: Reported on 08/14/2019 05/11/19   Barton Dubois, MD    Physical Exam: Constitutional: Moderately built and nourished. Vitals:   10/13/19 1505 10/13/19 1719 10/13/19 1834 10/13/19 2015  BP: 138/74 128/77 130/72 136/73  Pulse: 94 86 87 94  Resp: 18 18 18 18   Temp:  98 F (36.7 C) 99 F (37.2 C) 98 F (36.7 C)  TempSrc:  Oral Oral Oral  SpO2: 99% 99% 99% 95%  Weight:      Height:       Eyes: Anicteric no pallor. ENMT: No discharge from the ears eyes nose or mouth. Neck: No muscle.  No neck rigidity. Respiratory: No rhonchi or crepitations. Cardiovascular: S1-S2 heard. Abdomen: Soft nontender bowel sounds present. Musculoskeletal: No edema. Skin: No rash. Neurologic: Alert awake oriented to time place and person.  Moves all extremities. Psychiatric: Appears normal per normal  affect.   Labs on Admission: I have personally reviewed following labs and imaging studies  CBC: Recent Labs  Lab 10/13/19 1131  WBC 15.5*  NEUTROABS 11.9*  HGB 9.1*  HCT 30.1*  MCV 72.9*  PLT 259*   Basic Metabolic Panel: Recent Labs  Lab 10/13/19 1131  NA 139  K 3.0*  CL 104  CO2 26  GLUCOSE 107*  BUN 9  CREATININE 0.96  CALCIUM 8.3*   GFR: Estimated Creatinine Clearance: 51.9 mL/min (by C-G formula based on SCr of 0.96 mg/dL). Liver Function Tests: Recent Labs  Lab 10/13/19 1131  AST 12*  ALT 9  ALKPHOS 74  BILITOT 0.6  PROT 6.5  ALBUMIN 2.6*   Recent Labs  Lab 10/13/19 1131  LIPASE 20   No results for input(s): AMMONIA in the last 168 hours. Coagulation Profile: No results for input(s): INR, PROTIME in the last 168 hours. Cardiac Enzymes: No results for input(s): CKTOTAL, CKMB, CKMBINDEX, TROPONINI in the last 168 hours. BNP (last 3 results) No results for input(s): PROBNP in the last 8760 hours. HbA1C:  No results for input(s): HGBA1C in the last 72 hours. CBG: No results for input(s): GLUCAP in the last 168 hours. Lipid Profile: No results for input(s): CHOL, HDL, LDLCALC, TRIG, CHOLHDL, LDLDIRECT in the last 72 hours. Thyroid Function Tests: No results for input(s): TSH, T4TOTAL, FREET4, T3FREE, THYROIDAB in the last 72 hours. Anemia Panel: No results for input(s): VITAMINB12, FOLATE, FERRITIN, TIBC, IRON, RETICCTPCT in the last 72 hours. Urine analysis:    Component Value Date/Time   COLORURINE STRAW (A) 10/13/2019 1135   APPEARANCEUR CLEAR 10/13/2019 1135   LABSPEC <1.005 (L) 10/13/2019 1135   PHURINE 7.0 10/13/2019 1135   GLUCOSEU NEGATIVE 10/13/2019 1135   HGBUR NEGATIVE 10/13/2019 1135   BILIRUBINUR NEGATIVE 10/13/2019 1135   KETONESUR NEGATIVE 10/13/2019 1135   PROTEINUR NEGATIVE 10/13/2019 1135   NITRITE NEGATIVE 10/13/2019 1135   LEUKOCYTESUR NEGATIVE 10/13/2019 1135   Sepsis  Labs: @LABRCNTIP (procalcitonin:4,lacticidven:4) ) Recent Results (from the past 240 hour(s))  Blood culture (routine x 2)     Status: None (Preliminary result)   Collection Time: 10/13/19  2:50 PM   Specimen: BLOOD LEFT HAND  Result Value Ref Range Status   Specimen Description   Final    BLOOD LEFT HAND Performed at [facility], [place] [address], [place], [place] [address]    Special Requests   Final    BOTTLES DRAWN AEROBIC AND ANAEROBIC Blood Culture results may not be optimal due to an inadequate volume of blood received in culture bottles Performed at [facility], [address] [place], [place] [address]    Culture PENDING  Incomplete   Report Status PENDING  Incomplete     Radiological Exams on Admission: CT ABDOMEN PELVIS W CONTRAST  Addendum Date: 10/13/2019   ADDENDUM REPORT: 10/13/2019 13:17 ADDENDUM: Findings of worsening colitis and nonocclusive of right ovarian vein thrombus were related to the provider at the time of dictation of this addendum. These results were called by telephone at the time of interpretation on 10/13/2019 at 1:17 pm to provider [facility] , who verbally acknowledged these results. Electronically Signed   By: Zetta Bills M.D.   On: 10/13/2019 13:17   Result Date: 10/13/2019 CLINICAL DATA:  Anorectal abscess. Decline since recent hemorrhoid operation. EXAM: CT ABDOMEN AND PELVIS WITH CONTRAST TECHNIQUE: Multidetector CT imaging of the abdomen and pelvis was performed using the standard protocol following bolus administration of intravenous contrast. CONTRAST:  110m OMNIPAQUE IOHEXOL 300 MG/ML  SOLN COMPARISON:  05/08/2019 FINDINGS: Lower chest: Incidental imaging of the lung bases is unremarkable. Hepatobiliary: Liver is normal. Minimal intra and extrahepatic biliary ductal distension likely post cholecystectomy baseline without change Pancreas: Pancreas is normal without signs of focal lesion, ductal dilation or inflammation. Spleen:  Lobular appearance of the spleen. Adrenals/Urinary Tract: Left lateral limb of the adrenal gland with stable approximately 2 cm lesion arising from the left adrenal. Density values indeterminate on previous studies. Mild right adrenal thickening is unchanged. Signs of renal cortical scarring bilaterally and small left renal cysts similar to previous exams. Stomach/Bowel: No signs of acute small bowel process. Stomach is normal. There is mild thickening of the appendix which is unchanged based on comparison with August 22, 2018. Diffuse pericolonic stranding and signs of colonic thickening throughout the colon extending through the rectum. Perianal stranding is noted and there is low attenuation with tract like morphology that extends posteriorly in the gluteal cleft, no sign of large fluid collection. The tract measures approximately 5 mm greatest with. No  intraperitoneal fluid collection or signs of free air. Vascular/Lymphatic: Calcified atherosclerosis scattered throughout the abdominal aorta. No signs of aneurysm. No evidence of retroperitoneal adenopathy. No sign of pelvic lymphadenopathy. Reproductive: Uterus with unremarkable CT appearance. The nonocclusive thrombus suspected in right ovarian vein best seen on delayed image 32 of series 7. Other: No signs of free air or abscess. Musculoskeletal: No sign of acute bone finding or destructive bone process. IMPRESSION: 1. Signs of colitis favoring infectious or inflammatory colitis given distribution. Findings not classic for diverticulitis given the long segment involvement. Correlate with recent antibiotic administration and for evidence of C difficile colitis. 2. No evidence of intraperitoneal fluid collection or signs of free air. 3. Perianal sinus tract or fistulous network is considered. No signs of perianal abscess. 4. Nonocclusive thrombus suspected in the right ovarian vein. 5. Stable appearance of left adrenal lesion. This remains indeterminate.  Consider follow-up CT adrenal protocol. 6. Stable mild thickening of the appendix. A call is out to the referring provider to further discuss findings in the above case. Aortic Atherosclerosis (ICD10-I70.0). Electronically Signed: By: Zetta Bills M.D. On: 10/13/2019 13:08      Assessment/Plan Principal Problem:   Colitis with fistula Active Problems:   Colitis   Hypokalemia   Microcytic anemia    1. Colitis with possible perianal fistulous tract for which patient has been placed on empiric antibiotics follow stool studies including stools for C. difficile GI was notified by the ER physician will follow GI recommendations. 2. Right ovarian vein nonocclusive thrombus -we will discuss with OB/GYN about starting anticoagulation.  Will need to closely monitor CBC if anticoagulation started since patient does complain of some bloody diarrhea. 3. Anemia appears to be chronic but note that patient also complains of some bloody discharge during the diarrhea.  Follow CBC closely.  Type and screen. 4. Mild hypokalemia replace recheck. 5. Hypothyroidism on Synthroid.  Addendum -discussed with on-call OB/GYN about patient's right ovarian vein nonocclusive thrombus.  Dr. Elonda Husky who at this time advised anticoagulation if possible.  Closely watch out for any further rectal bleeding follow CBC.  Covid test is pending.  Since patient has persistent diarrhea with colitis and possible fistulous tract with dehydration clinically will need close monitoring for any deterioration in inpatient status.   DVT prophylaxis: Heparin infusion was started for the right ovarian vein thrombosis after discussing with OB/GYN Dr. Elonda Husky.  Please watch out for any further bleeding. Code Status: Full code. Family Communication: Discussed with patient. Disposition Plan: Home. Consults called: ER physician discussed with gastroenterologist. Admission status: Inpatient.   Rise Patience MD [facility] Pager  [phone].  If 7PM-7AM, please contact night-coverage [email or web address] Password [facility]  10/13/2019, 9:29 PM

## 2019-10-13 NOTE — ED Notes (Signed)
Called [facility]  Dr. Woodward Ku office paged Amy PA

## 2019-10-13 NOTE — ED Provider Notes (Signed)
[facility] Provider Note   CSN: [id number] Arrival date & time: 10/13/19  1034     History Chief Complaint  Patient presents with  . Diarrhea    [facility] is a 74 y.o. female.  HPI 74 year old female with a past medical history significant for hypertension, hypothyroidism, who presents to the emergency department today for evaluation of diarrhea, fatigue, weakness, bloody stools, generalized abdominal pain, nausea and vomiting.  Patient reports that she been dealing with diarrhea since August of last year.  Patient reports that she is been seen by GI doctor who did a colonoscopy that showed some granulosa tissue.  Patient reports that she had hemorrhoids and followed up with general surgery and had a hemorrhoidectomy done last month.  Patient reports that since then she has had continued diarrhea.  Patient reports mucousy stools.  She reports some generalized abdominal pain with some pink-tinged mucus to her stools.  She reports some nausea and intermittent vomiting.  No fevers or chills.  Reports weakness, weight loss and fatigue.  Patient has tried several over-the-counter medications that were prescribed by her surgeon without any improvement in symptoms.      Past Medical History:  Diagnosis Date  . Anxiety   . Anxiety disorder, unspecified   . Essential (primary) hypertension   . Gastro-esophageal reflux disease with esophagitis   . Hypokalemia   . Hypothyroidism   . Noninfective gastroenteritis and colitis, unspecified     Patient Active Problem List   Diagnosis Date Noted  . Colitis with fistula 10/13/2019  . Rectal ulceration   . Hemorrhoids 07/29/2019  . Anxiety disorder, unspecified   . Gastro-esophageal reflux disease with esophagitis   . Essential (primary) hypertension   . Noninfective gastroenteritis and colitis, unspecified   . Pyelonephritis   . Sepsis with acute organ dysfunction ([place]) 05/08/2019  . Nephrolithiasis  05/08/2019  . Rigors 05/08/2019  . Hypothyroidism   . UTI (urinary tract infection)   . Gross hematuria   . Renal colic on right side   . Ureteral stone with hydronephrosis   . Dehydration   . AKI (acute kidney injury) ([place])   . Thrombocytosis ([place])   . Dysphagia 01/21/2019  . Microcytic anemia 01/21/2019  . Anemia 08/29/2018  . Insomnia 08/29/2018  . Abnormal CT of the abdomen 08/29/2018  . Diarrhea of infectious origin   . Colitis 08/28/2018  . Adrenal nodule ([place]) 08/28/2018  . Hypokalemia 08/28/2018  . Dehydration with hyponatremia 08/28/2018  . Leukocytosis 08/28/2018    Past Surgical History:  Procedure Laterality Date  . BIOPSY  09/02/2018   Procedure: BIOPSY;  Surgeon: Daneil Dolin, MD;  Location: [facility];  Service: Endoscopy;;  colon   . CHOLECYSTECTOMY  05/19/2011   Procedure: LAPAROSCOPIC CHOLECYSTECTOMY;  Surgeon: Jamesetta So;  Location: [facility] ORS;  Service: General;  Laterality: N/A;  . COLONOSCOPY WITH PROPOFOL N/A 09/02/2018   Dr. Gala Romney: Proctocolitis limited to the left side with some exudate versus partial pseudomembrane formation, erosions and ulceration somewhat superficial.  Terminal ileum normal.  Diverticulosis.  Descending/sigmoid colon biopsy showing severe active colitis, diagnostic features of IBD not identified.  Differential includes infection, ischemia.  Rectal biopsies with granulation tissue c/w ulcer  . CYSTOSCOPY W/ URETERAL STENT PLACEMENT Right 05/08/2019   Procedure: CYSTOSCOPY WITH RETROGRADE PYELOGRAM/URETERAL STENT PLACEMENT;  Surgeon: Festus Aloe, MD;  Location: [facility];  Service: Urology;  Laterality: Right;  . CYSTOSCOPY WITH URETEROSCOPY Right 06/11/2019   Procedure: CYSTOSCOPY WITH DIAGNOSTIC  URETEROSCOPY; URETERAL STENT REMOVAL;  Surgeon: Cleon Gustin, MD;  Location: [facility];  Service: Urology;  Laterality: Right;  . HEMORRHOID SURGERY N/A 09/16/2019   Procedure: EXTENSIVE HEMORRHOIDECTOMY;  Surgeon: Aviva Signs, MD;   Location: [facility];  Service: General;  Laterality: N/A;  . RECTAL BIOPSY N/A 09/16/2019   Procedure: BIOPSY RECTAL;  Surgeon: Aviva Signs, MD;  Location: [facility];  Service: General;  Laterality: N/A;  . TUBAL LIGATION       OB History   No obstetric history on file.     Family History  Problem Relation Age of Onset  . Cancer Father   . Heart disease Sister   . Heart disease Brother   . Cancer Brother   . Anesthesia problems Neg Hx   . Hypotension Neg Hx   . Malignant hyperthermia Neg Hx   . Pseudochol deficiency Neg Hx     Social History   Tobacco Use  . Smoking status: Never Smoker  . Smokeless tobacco: Never Used  Substance Use Topics  . Alcohol use: No  . Drug use: No    Home Medications Prior to Admission medications   Medication Sig Start Date End Date Taking? Authorizing Provider  acidophilus (RISAQUAD) CAPS capsule Take 1 capsule by mouth daily.    [provider]  ALPRAZolam Duanne Moron) 0.5 MG tablet Take 0.5 mg by mouth at bedtime as needed for anxiety or sleep.  05/27/19   [provider]  HYDROcodone-acetaminophen (NORCO) 5-325 MG tablet Take 1 tablet by mouth every 6 (six) hours as needed for moderate pain. 09/16/19   Aviva Signs, MD  hydrocortisone (ANUSOL-HC) 2.5 % rectal cream Place 1 application rectally 2 (two) times daily. [facility] HEMORRHOID CREAM 07/29/19   Annitta Needs, NP  ibuprofen (ADVIL) 200 MG tablet Take 200-400 mg by mouth every 6 (six) hours as needed for moderate pain.    [provider]  levothyroxine (SYNTHROID, LEVOTHROID) 25 MCG tablet Take 25 mcg by mouth daily before breakfast.     [provider]  ondansetron (ZOFRAN ODT) 8 MG disintegrating tablet Take 1 tablet (8 mg total) by mouth every 8 (eight) hours as needed for nausea or vomiting. Patient not taking: Reported on 08/14/2019 05/11/19   Barton Dubois, MD  pantoprazole (PROTONIX) 40 MG tablet Take 40 mg by mouth at bedtime.  08/17/18    [provider]  traZODone (DESYREL) 50 MG tablet Take 50 mg by mouth at bedtime as needed for sleep.  08/25/18   [provider]    Allergies    Tramadol  Review of Systems   Review of Systems  Constitutional: Positive for fatigue. Negative for chills and fever.  HENT: Negative for congestion.   Eyes: Negative for discharge.  Respiratory: Negative for shortness of breath.   Cardiovascular: Negative for chest pain.  Gastrointestinal: Positive for abdominal pain, blood in stool, diarrhea, nausea, rectal pain and vomiting.  Genitourinary: Negative for dysuria.  Musculoskeletal: Negative for myalgias.  Skin: Negative for color change.  Neurological: Positive for weakness.  Psychiatric/Behavioral: Negative for confusion.    Physical Exam Updated Vital Signs BP 138/74   Pulse 94   Temp 98 F (36.7 C) (Oral)   Resp 18   Ht 5\' 8"  (1.727 m)   Wt 75.5 kg   SpO2 99%   BMI 25.30 kg/m   Physical Exam Vitals and nursing note reviewed.  Constitutional:      General: She is not in acute distress.  Appearance: She is well-developed. She is not ill-appearing or toxic-appearing.  HENT:     Head: Normocephalic and atraumatic.     Nose: Nose normal.     Mouth/Throat:     Mouth: Mucous membranes are dry.  Eyes:     General:        Right eye: No discharge.        Left eye: No discharge.     Conjunctiva/sclera: Conjunctivae normal.     Pupils: Pupils are equal, round, and reactive to light.  Cardiovascular:     Rate and Rhythm: Normal rate and regular rhythm.     Heart sounds: Normal heart sounds.  Pulmonary:     Effort: Pulmonary effort is normal. No respiratory distress.     Breath sounds: Normal breath sounds.  Chest:     Chest wall: No tenderness.  Abdominal:     General: Bowel sounds are normal. There is no distension.     Palpations: Abdomen is soft. There is no mass.     Tenderness: There is no abdominal tenderness. There is no right CVA tenderness,  left CVA tenderness, guarding or rebound.     Hernia: No hernia is present.  Genitourinary:    Comments: Chaperone present for exam.  Patient does have changes consistent with prior hemorrhoidectomy.  She also has what looks like fistula formations with drainage from the area with palpation.  Mild pain ovation of the rectum itself.  No significant erythema or fluctuance concerning for gross abscess at this time. Musculoskeletal:        General: No tenderness. Normal range of motion.     Cervical back: Normal range of motion and neck supple.  Lymphadenopathy:     Cervical: No cervical adenopathy.  Skin:    General: Skin is warm and dry.     Capillary Refill: Capillary refill takes less than 2 seconds.  Neurological:     Mental Status: She is alert and oriented to person, place, and time.  Psychiatric:        Behavior: Behavior normal.        Thought Content: Thought content normal.        Judgment: Judgment normal.     ED Results / Procedures / Treatments   Labs (all labs ordered are listed, but only abnormal results are displayed) Labs Reviewed  CBC WITH DIFFERENTIAL/PLATELET - Abnormal; Notable for the following components:      Result Value   WBC 15.5 (*)    Hemoglobin 9.1 (*)    HCT 30.1 (*)    MCV 72.9 (*)    MCH 22.0 (*)    RDW 18.5 (*)    Platelets 502 (*)    Neutro Abs 11.9 (*)    Monocytes Absolute 1.1 (*)    Abs Immature Granulocytes 0.13 (*)    All other components within normal limits  COMPREHENSIVE METABOLIC PANEL - Abnormal; Notable for the following components:   Potassium 3.0 (*)    Glucose, Bld 107 (*)    Calcium 8.3 (*)    Albumin 2.6 (*)    AST 12 (*)    GFR calc non Af Amer 58 (*)    All other components within normal limits  URINALYSIS, ROUTINE W REFLEX MICROSCOPIC - Abnormal; Notable for the following components:   Color, Urine STRAW (*)    Specific Gravity, Urine <1.005 (*)    All other components within normal limits  C DIFFICILE QUICK SCREEN W  PCR REFLEX  GI PATHOGEN PANEL  BY PCR, STOOL  CULTURE, BLOOD (ROUTINE X 2)  CULTURE, BLOOD (ROUTINE X 2)  SARS CORONAVIRUS 2 (TAT 6-24 HRS)  LIPASE, BLOOD  LACTIC ACID, PLASMA  LACTIC ACID, PLASMA    EKG None  Radiology CT ABDOMEN PELVIS W CONTRAST  Addendum Date: 10/13/2019   ADDENDUM REPORT: 10/13/2019 13:17 ADDENDUM: Findings of worsening colitis and nonocclusive of right ovarian vein thrombus were related to the provider at the time of dictation of this addendum. These results were called by telephone at the time of interpretation on 10/13/2019 at 1:17 pm to provider [facility] , who verbally acknowledged these results. Electronically Signed   By: Zetta Bills M.D.   On: 10/13/2019 13:17   Result Date: 10/13/2019 CLINICAL DATA:  Anorectal abscess. Decline since recent hemorrhoid operation. EXAM: CT ABDOMEN AND PELVIS WITH CONTRAST TECHNIQUE: Multidetector CT imaging of the abdomen and pelvis was performed using the standard protocol following bolus administration of intravenous contrast. CONTRAST:  175mL OMNIPAQUE IOHEXOL 300 MG/ML  SOLN COMPARISON:  05/08/2019 FINDINGS: Lower chest: Incidental imaging of the lung bases is unremarkable. Hepatobiliary: Liver is normal. Minimal intra and extrahepatic biliary ductal distension likely post cholecystectomy baseline without change Pancreas: Pancreas is normal without signs of focal lesion, ductal dilation or inflammation. Spleen: Lobular appearance of the spleen. Adrenals/Urinary Tract: Left lateral limb of the adrenal gland with stable approximately 2 cm lesion arising from the left adrenal. Density values indeterminate on previous studies. Mild right adrenal thickening is unchanged. Signs of renal cortical scarring bilaterally and small left renal cysts similar to previous exams. Stomach/Bowel: No signs of acute small bowel process. Stomach is normal. There is mild thickening of the appendix which is unchanged based on comparison with August 22, 2018. Diffuse pericolonic stranding and signs of colonic thickening throughout the colon extending through the rectum. Perianal stranding is noted and there is low attenuation with tract like morphology that extends posteriorly in the gluteal cleft, no sign of large fluid collection. The tract measures approximately 5 mm greatest with. No intraperitoneal fluid collection or signs of free air. Vascular/Lymphatic: Calcified atherosclerosis scattered throughout the abdominal aorta. No signs of aneurysm. No evidence of retroperitoneal adenopathy. No sign of pelvic lymphadenopathy. Reproductive: Uterus with unremarkable CT appearance. The nonocclusive thrombus suspected in right ovarian vein best seen on delayed image 32 of series 7. Other: No signs of free air or abscess. Musculoskeletal: No sign of acute bone finding or destructive bone process. IMPRESSION: 1. Signs of colitis favoring infectious or inflammatory colitis given distribution. Findings not classic for diverticulitis given the long segment involvement. Correlate with recent antibiotic administration and for evidence of C difficile colitis. 2. No evidence of intraperitoneal fluid collection or signs of free air. 3. Perianal sinus tract or fistulous network is considered. No signs of perianal abscess. 4. Nonocclusive thrombus suspected in the right ovarian vein. 5. Stable appearance of left adrenal lesion. This remains indeterminate. Consider follow-up CT adrenal protocol. 6. Stable mild thickening of the appendix. A call is out to the referring provider to further discuss findings in the above case. Aortic Atherosclerosis (ICD10-I70.0). Electronically Signed: By: Zetta Bills M.D. On: 10/13/2019 13:08    Procedures Procedures (including critical care time)  Medications Ordered in ED Medications  morphine 4 MG/ML injection 4 mg (4 mg Intravenous Given 10/13/19 1216)  ondansetron (ZOFRAN) injection 4 mg (4 mg Intravenous Given 10/13/19 1216)  0.9 %   sodium chloride infusion ( Intravenous New Bag/Given 10/13/19 1216)  iohexol (OMNIPAQUE) 300 MG/ML solution  100 mL (100 mLs Intravenous Contrast Given 10/13/19 1231)  piperacillin-tazobactam (ZOSYN) IVPB 3.375 g (0 g Intravenous Stopped 10/13/19 1548)    ED Course  I have reviewed the triage vital signs and the nursing notes.  Pertinent labs & imaging results that were available during my care of the patient were reviewed by me and considered in my medical decision making (see chart for details).    MDM Rules/Calculators/A&P                      74 year old female presents the ER for evaluation of ongoing diarrhea, abdominal pain, rectal pain, nausea, vomiting and fatigue.  On exam vital signs are reassuring.  Patient does have what appears to be fistula formation to the rectum with drainage to the area.  Patient has no focal abdominal pain.  Labs show leukocytosis of 15,000.  Hemoglobin 9.1 at baseline.  Mild hypokalemia 3.0.  Patient given fluids in the ER.  Lactic acid normal.  Lipase normal.  UA shows no signs of infection.  Patient unable by stool sample this time for GI panel and C. difficile.  Blood cultures pending.  CT scan performed shows concern for persistent colitis that is worse than prior.  Patient also has has concern for fistula formation without any drainable abscess or fluid collection.  Case was discussed with Dr. Marcello Moores with general surgery.  She states that from a general surgery standpoint there is no intervention at this time.  She does recommend treatment of colitis and consultation with GI.  Case was discussed with gastroenterology provider who recommends admission.  Will see patient in consultation.  Patient started on Zosyn.  Of note patient also has nonocclusive thrombus of the right ovarian vein.  Patient has stable appearing left adrenal mass likely follow-up in the outpatient setting.  Case was discussed with Dr. Lonny Prude who accepts patient for admission.  I will hold on  anticoagulation at this time and defer to inpatient team.   Final Clinical Impression(s) / ED Diagnoses Final diagnoses:  Diarrhea, unspecified type  Colitis  Hypokalemia  Thrombosis of ovarian vein  Adrenal mass [facility])    Rx / DC Orders ED Discharge Orders    None       Doristine Devoid, PA-C 10/13/19 1743    Tegeler, Gwenyth Allegra, MD 10/14/19 [phone]

## 2019-10-13 NOTE — Progress Notes (Signed)
Pharmacy Antibiotic Note  [facility] is a 74 y.o. female admitted on 10/13/2019 with intra-abdominal infection. Pharmacy has been consulted for Zosyn dosing.  Plan: Zosyn 3.375 g IV given every 8 hrs by 4-hr infusion   Height: 5' 8"  (172.7 cm) Weight: 166 lb 6.4 oz (75.5 kg) IBW/kg (Calculated) : 63.9  Temp (24hrs), Avg:98.3 F (36.8 C), Min:98 F (36.7 C), Max:99 F (37.2 C)  Recent Labs  Lab 10/13/19 1131 10/13/19 1450  WBC 15.5*  --   CREATININE 0.96  --   LATICACIDVEN  --  1.5    Estimated Creatinine Clearance: 51.9 mL/min (by C-G formula based on SCr of 0.96 mg/dL).    Allergies  Allergen Reactions  . Tramadol     Says "blew my head up."    Thank you for allowing pharmacy to be a part of this patient's care.  Jaclynn Laumann A 10/13/2019 9:34 PM

## 2019-10-14 ENCOUNTER — Inpatient Hospital Stay (HOSPITAL_COMMUNITY): Payer: Medicare HMO

## 2019-10-14 DIAGNOSIS — D509 Iron deficiency anemia, unspecified: Secondary | ICD-10-CM

## 2019-10-14 DIAGNOSIS — R935 Abnormal findings on diagnostic imaging of other abdominal regions, including retroperitoneum: Secondary | ICD-10-CM

## 2019-10-14 DIAGNOSIS — E876 Hypokalemia: Secondary | ICD-10-CM

## 2019-10-14 DIAGNOSIS — K529 Noninfective gastroenteritis and colitis, unspecified: Secondary | ICD-10-CM

## 2019-10-14 DIAGNOSIS — K632 Fistula of intestine: Secondary | ICD-10-CM

## 2019-10-14 LAB — CBC
HCT: 28.8 % — ABNORMAL LOW (ref 36.0–46.0)
Hemoglobin: 8.5 g/dL — ABNORMAL LOW (ref 12.0–15.0)
MCH: 21.7 pg — ABNORMAL LOW (ref 26.0–34.0)
MCHC: 29.5 g/dL — ABNORMAL LOW (ref 30.0–36.0)
MCV: 73.5 fL — ABNORMAL LOW (ref 80.0–100.0)
Platelets: 451 10*3/uL — ABNORMAL HIGH (ref 150–400)
RBC: 3.92 MIL/uL (ref 3.87–5.11)
RDW: 18.6 % — ABNORMAL HIGH (ref 11.5–15.5)
WBC: 13.2 10*3/uL — ABNORMAL HIGH (ref 4.0–10.5)
nRBC: 0 % (ref 0.0–0.2)

## 2019-10-14 LAB — RETICULOCYTES
Immature Retic Fract: 34.5 % — ABNORMAL HIGH (ref 2.3–15.9)
RBC.: 4.27 MIL/uL (ref 3.87–5.11)
Retic Count, Absolute: 55.1 10*3/uL (ref 19.0–186.0)
Retic Ct Pct: 1.3 % (ref 0.4–3.1)

## 2019-10-14 LAB — C-REACTIVE PROTEIN: CRP: 9.1 mg/dL — ABNORMAL HIGH (ref <1.0)

## 2019-10-14 LAB — FOLATE: Folate: 18.8 ng/mL (ref >5.9)

## 2019-10-14 LAB — IRON AND TIBC
Iron: 12 ug/dL — ABNORMAL LOW (ref 28–170)
Saturation Ratios: 6 % — ABNORMAL LOW (ref 10.4–31.8)
TIBC: 188 ug/dL — ABNORMAL LOW (ref 250–450)
UIBC: 176 ug/dL

## 2019-10-14 LAB — BASIC METABOLIC PANEL
Anion gap: 7 (ref 5–15)
BUN: 9 mg/dL (ref 8–23)
CO2: 29 mmol/L (ref 22–32)
Calcium: 8.3 mg/dL — ABNORMAL LOW (ref 8.9–10.3)
Chloride: 107 mmol/L (ref 98–111)
Creatinine, Ser: 1.05 mg/dL — ABNORMAL HIGH (ref 0.44–1.00)
GFR calc Af Amer: >60 mL/min (ref >60)
GFR calc non Af Amer: 52 mL/min — ABNORMAL LOW (ref >60)
Glucose, Bld: 116 mg/dL — ABNORMAL HIGH (ref 70–99)
Potassium: 3.7 mmol/L (ref 3.5–5.1)
Sodium: 143 mmol/L (ref 135–145)

## 2019-10-14 LAB — SARS CORONAVIRUS 2 (TAT 6-24 HRS): SARS Coronavirus 2: POSITIVE — AB

## 2019-10-14 LAB — PROTIME-INR
INR: 1.1 (ref 0.8–1.2)
Prothrombin Time: 13.7 seconds (ref 11.4–15.2)

## 2019-10-14 LAB — APTT: aPTT: 29 seconds (ref 24–36)

## 2019-10-14 LAB — HEPARIN LEVEL (UNFRACTIONATED): Heparin Unfractionated: <0.1 IU/mL — ABNORMAL LOW (ref 0.30–0.70)

## 2019-10-14 LAB — TYPE AND SCREEN
ABO/RH(D): O POS
Antibody Screen: NEGATIVE

## 2019-10-14 LAB — VITAMIN B12: Vitamin B-12: 914 pg/mL (ref 180–914)

## 2019-10-14 LAB — SEDIMENTATION RATE: Sed Rate: 38 mm/hr — ABNORMAL HIGH (ref 0–22)

## 2019-10-14 LAB — FERRITIN: Ferritin: 67 ng/mL (ref 11–307)

## 2019-10-14 LAB — ABO/RH: ABO/RH(D): O POS

## 2019-10-14 MED ORDER — ZINC SULFATE 220 (50 ZN) MG PO CAPS
220.0000 mg | ORAL_CAPSULE | Freq: Every day | ORAL | Status: DC
  Filled 2019-10-14: qty 1

## 2019-10-14 MED ORDER — ALPRAZOLAM 0.5 MG PO TABS
0.5000 mg | ORAL_TABLET | Freq: Every day | ORAL | Status: DC | PRN
  Administered 2019-10-14 – 2019-10-16 (×2): 0.5 mg via ORAL
  Filled 2019-10-14 (×2): qty 1

## 2019-10-14 MED ORDER — ADULT MULTIVITAMIN W/MINERALS CH
1.0000 | ORAL_TABLET | Freq: Every day | ORAL | Status: DC
  Administered 2019-10-14 – 2019-10-15 (×2): 1 via ORAL
  Filled 2019-10-14 (×2): qty 1

## 2019-10-14 MED ORDER — LIDOCAINE VISCOUS HCL 2 % MT SOLN
15.0000 mL | OROMUCOSAL | Status: DC | PRN
  Administered 2019-10-14 – 2019-10-15 (×4): 15 mL via OROMUCOSAL
  Filled 2019-10-14 (×5): qty 15

## 2019-10-14 MED ORDER — BOOST / RESOURCE BREEZE PO LIQD CUSTOM
1.0000 | Freq: Two times a day (BID) | ORAL | Status: DC
  Administered 2019-10-14 – 2019-10-15 (×4): 1 via ORAL

## 2019-10-14 MED ORDER — PRO-STAT SUGAR FREE PO LIQD
30.0000 mL | Freq: Two times a day (BID) | ORAL | Status: DC
  Administered 2019-10-14 – 2019-10-15 (×3): 30 mL via ORAL
  Filled 2019-10-14 (×3): qty 30

## 2019-10-14 MED ORDER — ASCORBIC ACID 500 MG PO TABS: 500.0000 mg | ORAL_TABLET | Freq: Every day | ORAL | Status: DC

## 2019-10-14 MED ORDER — LIP MEDEX EX OINT
TOPICAL_OINTMENT | CUTANEOUS | Status: DC | PRN
  Filled 2019-10-14: qty 7

## 2019-10-14 MED ORDER — HEPARIN (PORCINE) 25000 UT/250ML-% IV SOLN
1350.0000 [IU]/h | INTRAVENOUS | Status: DC
  Administered 2019-10-14: 1000 [IU]/h via INTRAVENOUS
  Administered 2019-10-15: 1350 [IU]/h via INTRAVENOUS
  Filled 2019-10-14 (×2): qty 250

## 2019-10-14 NOTE — Progress Notes (Signed)
ANTICOAGULATION CONSULT NOTE   Pharmacy Consult for Heparin infusion Indication: rt ovarian vein thrombus  Allergies  Allergen Reactions  . Tramadol     Says "blew my head up."    Patient Measurements: Height: 5' 8"  (172.7 cm) Weight: 160 lb 14.4 oz (73 kg) IBW/kg (Calculated) : 63.9 Heparin Dosing Weight: 73  Vital Signs: Temp: 98 F (36.7 C) (03/09 1432) BP: 116/65 (03/09 1432) Pulse Rate: 84 (03/09 1432)  Labs: Recent Labs    10/13/19 1131 10/14/19 0506 10/14/19 0750 10/14/19 1651  HGB 9.1* 8.5*  --   --   HCT 30.1* 28.8*  --   --   PLT 502* 451*  --   --   APTT  --   --  29  --   LABPROT  --   --  13.7  --   INR  --   --  1.1  --   HEPARINUNFRC  --   --   --  <0.10*  CREATININE 0.96 1.05*  --   --     Estimated Creatinine Clearance: 47.4 mL/min (A) (by C-G formula based on SCr of 1.05 mg/dL (H)).   Medical History: Past Medical History:  Diagnosis Date  . Anxiety   . Anxiety disorder, unspecified   . Essential (primary) hypertension   . Gastro-esophageal reflux disease with esophagitis   . Hypokalemia   . Hypothyroidism   . Noninfective gastroenteritis and colitis, unspecified     Medications:  Scheduled:  . acidophilus  1 capsule Oral Daily  . feeding supplement  1 Container Oral BID BM  . feeding supplement (PRO-STAT SUGAR FREE 64)  30 mL Oral BID  . levothyroxine  25 mcg Oral QAC breakfast  . multivitamin with minerals  1 tablet Oral Daily  . pantoprazole  40 mg Oral QHS    Assessment: 74 y/o F with recent hemorrhoidectomy followed by persistent diarrhea with some blood. CT abdomen pelvis concerning for sinus/fistulous tract around the perianal area along with right ovarian vein nonocclusive thrombosis. Pharmacy consulted for heparin infusion for VTE.   1700 HL < 0.1 subtherapeutic on 1000 units/hr No bleeding or other issues noted  Goal of Therapy:  Heparin level 0.3-0.5 units/ml Monitor platelets by anticoagulation protocol: Yes    Plan:  No bolus, low goal with bleeding  Increase heparin to 1250 units/hr Heparin level in 8 hours Continue to monitor H/H and platelets  Dolly Rias RPh 10/14/2019, 7:28 PM

## 2019-10-14 NOTE — Progress Notes (Signed)
ANTICOAGULATION CONSULT NOTE - Initial Consult  Pharmacy Consult for Heparin infusion Indication: rt ovarian vein thrombus  Allergies  Allergen Reactions  . Tramadol     Says "blew my head up."    Patient Measurements: Height: 5' 8"  (172.7 cm) Weight: 160 lb 14.4 oz (73 kg) IBW/kg (Calculated) : 63.9 Heparin Dosing Weight: 73  Vital Signs: Temp: 97.7 F (36.5 C) (03/09 0433) Temp Source: Oral (03/09 0433) BP: 114/58 (03/09 0433) Pulse Rate: 74 (03/09 0433)  Labs: Recent Labs    10/13/19 1131 10/14/19 0506  HGB 9.1* 8.5*  HCT 30.1* 28.8*  PLT 502* 451*  CREATININE 0.96 1.05*    Estimated Creatinine Clearance: 47.4 mL/min (A) (by C-G formula based on SCr of 1.05 mg/dL (H)).   Medical History: Past Medical History:  Diagnosis Date  . Anxiety   . Anxiety disorder, unspecified   . Essential (primary) hypertension   . Gastro-esophageal reflux disease with esophagitis   . Hypokalemia   . Hypothyroidism   . Noninfective gastroenteritis and colitis, unspecified     Medications:  Scheduled:  . acidophilus  1 capsule Oral Daily  . levothyroxine  25 mcg Oral QAC breakfast  . pantoprazole  40 mg Oral QHS    Assessment: 74 y/o F with recent hemorrhoidectomy followed by persistent diarrhea with some blood. CT abdomen pelvis concerning for sinus/fistulous tract around the perianal area along with right ovarian vein nonocclusive thrombosis. Pharmacy consulted for heparin infusion for VTE.   Goal of Therapy:  Heparin level 0.3-0.5 units/ml Monitor platelets by anticoagulation protocol: Yes   Plan:  No bolus, low goal with bleeding  Start heparin infusion at 1000 units/hr Check anti-Xa level in 8 hours and daily while on heparin Continue to monitor H&H and platelets  Ulice Dash D 10/14/2019,7:15 AM

## 2019-10-14 NOTE — Progress Notes (Signed)
PROGRESS NOTE  [name]  HYW:[id number] DOB: 1945/10/27 DOA: 10/13/2019 PCP: [facility]   Brief Narrative: Rhonda Bailey is a 74 y.o. female with history of hypothyroidism who had recent hemorrhoidectomy on September 16, 2019 following which patient states she has been having persistent diarrhea at least about 10 episodes a day with some blood in it.  Also noticed that some discharge from the perianal area.  Sometimes bloody.  Denies any fever chills.  Has been having poor appetite at times vomiting.  Given the symptoms patient presents to the ER admits in [facility].  ED Course: CT abdomen pelvis done in the ER shows features concerning for colitis with possible sinus/fistulous tract around the perianal area.  ER physician discussed with on-call general surgery who advised GI consult and ER physician discussed with GI who advised patient to be placed on Zosyn and check stool studies for C. difficile.  Labs reveal hemoglobin is around 9.1 gets comparable to recent ONE.  WBC count of 15.5 platelets 485 complete metabolic panel shows mild hypokalemia.  Patient was started on Zosyn IV fluids and admitted for further management.  There was also finding of a right ovarian vein nonocclusive thrombus.  Assessment & Plan: Principal Problem:   Colitis with fistula Active Problems:   Colitis   Hypokalemia   Microcytic anemia  Colitis with possible perianal fistula:  - Distribution of colitis could indicate infection for which patient has been placed on empiric antibiotics. Stool studies pending. Empiric contact precautions - With mouth/throat sores, ?inflammatory component  Mouth sores, sore throat:  - Carmex, viscous lidocaine, antireflux Tx.  Right ovarian vein nonocclusive thrombus:  - Continue IV heparin for now, monitor for bleeding  Chronic microcytic anemia: Likely a mix of chronic disease and iron deficiency - T&S performed, monitor for bleeding on blood thinner  and check CBC daily.  Hypokalemia: Resolved with supplementation  History of SARS-CoV-2 infection: Asymptomatic noted on preoperative screening Jan 11. Was tested at admission and again positive, no symptoms, this is consistent with noninfectious carrier state for which no further work up, treatment, or isolation is required.  Hypothyroidism:  - Continue synthroid  Left adrenal lesion: Seen on imaging, remains indeterminate.  - Consider follow-up CT adrenal protocol.  Anxiety, insomnia:  - Xanax, trazodone prn  GERD:  - Continue PPI  DVT prophylaxis: IV heparin Code Status: Full Family Communication: None at bedside Disposition Plan: Pending further work up, may return home.  Consultants:   GI  OB/GYN, Dr. Elonda Husky by phone  Procedures:   None  Antimicrobials:  Zosyn   Subjective: Minimal current abdominal pain. No BM since arrival, denies bleeding. Having severe perianal pain which is constant and worse with movement/palpation. Also started having mouth sores and throat pain mostly on the right side for the past few days.  Objective: Vitals:   10/13/19 1834 10/13/19 2015 10/14/19 0433 10/14/19 1432  BP: 130/72 136/73 (!) 114/58 116/65  Pulse: 87 94 74 84  Resp: 18 18 18 18   Temp: 99 F (37.2 C) 98 F (36.7 C) 97.7 F (36.5 C) 98 F (36.7 C)  TempSrc: Oral Oral Oral   SpO2: 99% 95% 97% 97%  Weight:  73 kg    Height:  5' 8"  (1.727 m)      Intake/Output Summary (Last 24 hours) at 10/14/2019 1607 Last data filed at 10/14/2019 1400 Gross per 24 hour  Intake 1415.9 ml  Output 800 ml  Net 615.9 ml  Filed Weights   10/13/19 1055 10/13/19 2015  Weight: 75.5 kg 73 kg   Gen: 75 y.o. female in no distress  Pulm: Non-labored breathing. Clear to auscultation bilaterally.  CV: Regular rate and rhythm. No murmur, rub, or gallop. No JVD, no pedal edema. GI: Abdomen soft, non-tender, non-distended, with normoactive bowel sounds. No organomegaly or masses felt. Ext:  Warm, no deformities Skin: Superficial ulcerations on mouth/lips.  Neuro: Alert and oriented. No focal neurological deficits. Psych: Judgement and insight appear normal. Mood & affect appropriate.   Data Reviewed: I have personally reviewed following labs and imaging studies  CBC: Recent Labs  Lab 10/13/19 1131 10/14/19 0506  WBC 15.5* 13.2*  NEUTROABS 11.9*  --   HGB 9.1* 8.5*  HCT 30.1* 28.8*  MCV 72.9* 73.5*  PLT 502* 161*   Basic Metabolic Panel: Recent Labs  Lab 10/13/19 1131 10/14/19 0506  NA 139 143  K 3.0* 3.7  CL 104 107  CO2 26 29  GLUCOSE 107* 116*  BUN 9 9  CREATININE 0.96 1.05*  CALCIUM 8.3* 8.3*   GFR: Estimated Creatinine Clearance: 47.4 mL/min (A) (by C-G formula based on SCr of 1.05 mg/dL (H)). Liver Function Tests: Recent Labs  Lab 10/13/19 1131  AST 12*  ALT 9  ALKPHOS 74  BILITOT 0.6  PROT 6.5  ALBUMIN 2.6*   Recent Labs  Lab 10/13/19 1131  LIPASE 20   No results for input(s): AMMONIA in the last 168 hours. Coagulation Profile: Recent Labs  Lab 10/14/19 0750  INR 1.1   Cardiac Enzymes: No results for input(s): CKTOTAL, CKMB, CKMBINDEX, TROPONINI in the last 168 hours. BNP (last 3 results) No results for input(s): PROBNP in the last 8760 hours. HbA1C: No results for input(s): HGBA1C in the last 72 hours. CBG: No results for input(s): GLUCAP in the last 168 hours. Lipid Profile: No results for input(s): CHOL, HDL, LDLCALC, TRIG, CHOLHDL, LDLDIRECT in the last 72 hours. Thyroid Function Tests: No results for input(s): TSH, T4TOTAL, FREET4, T3FREE, THYROIDAB in the last 72 hours. Anemia Panel: No results for input(s): VITAMINB12, FOLATE, FERRITIN, TIBC, IRON, RETICCTPCT in the last 72 hours. Urine analysis:    Component Value Date/Time   COLORURINE STRAW (A) 10/13/2019 1135   APPEARANCEUR CLEAR 10/13/2019 1135   LABSPEC <1.005 (L) 10/13/2019 1135   PHURINE 7.0 10/13/2019 1135   GLUCOSEU NEGATIVE 10/13/2019 1135   HGBUR  NEGATIVE 10/13/2019 1135   BILIRUBINUR NEGATIVE 10/13/2019 1135   KETONESUR NEGATIVE 10/13/2019 1135   PROTEINUR NEGATIVE 10/13/2019 1135   NITRITE NEGATIVE 10/13/2019 1135   LEUKOCYTESUR NEGATIVE 10/13/2019 1135   Recent Results (from the past 240 hour(s))  Blood culture (routine x 2)     Status: None (Preliminary result)   Collection Time: 10/13/19  2:45 PM   Specimen: BLOOD  Result Value Ref Range Status   Specimen Description   Final    BLOOD RIGHT ANTECUBITAL Performed at [facility], [address] [place], [place] [address]    Special Requests   Final    BOTTLES DRAWN AEROBIC AND ANAEROBIC Blood Culture adequate volume Performed at [facility], [address] [place], [place] [address]    Culture   Final    NO GROWTH < 24 HOURS Performed at [facility], [place] [address], [place], [place] [address]    Report Status PENDING  Incomplete  Blood culture (routine x 2)     Status: None (Preliminary result)   Collection  Time: 10/13/19  2:50 PM   Specimen: BLOOD LEFT HAND  Result Value Ref Range Status   Specimen Description   Final    BLOOD LEFT HAND Performed at [facility], [place] [address] [place], [place] [address]    Special Requests   Final    BOTTLES DRAWN AEROBIC AND ANAEROBIC Blood Culture results may not be optimal due to an inadequate volume of blood received in culture bottles Performed at [facility] [address] [place], [place] [address]    Culture   Final    NO GROWTH < 24 HOURS Performed at [facility], [place] [address], [place], [place] [address]    Report Status PENDING  Incomplete  SARS CORONAVIRUS 2 (TAT 6-24 HRS) Nasopharyngeal Nasopharyngeal Swab     Status: Abnormal   Collection Time: 10/13/19  2:50 PM   Specimen: Nasopharyngeal Swab  Result Value Ref Range Status   SARS Coronavirus 2 POSITIVE (A) NEGATIVE Final    Comment: EMAILED LORI B. AT 0545 ON 10/14/2019 BY MESSAN  H. (NOTE) SARS-CoV-2 target nucleic acids are DETECTED. The SARS-CoV-2 RNA is generally detectable in upper and lower respiratory specimens during the acute phase of infection. Positive results are indicative of the presence of SARS-CoV-2 RNA. Clinical correlation with patient history and other diagnostic information is  necessary to determine patient infection status. Positive results do not rule out bacterial infection or co-infection with other viruses.  The expected result is Negative. Fact Sheet for Patients: SugarRoll.be Fact Sheet for Healthcare Providers: https://www.woods-mathews.com/ This test is not yet approved or cleared by the [place] FDA and  has been authorized for detection and/or diagnosis of SARS-CoV-2 by FDA under an Emergency Use Authorization (EUA). This EUA will remain  in effect (meaning this test can be used) for the duration of the COVID-19 declaration  under Section 564(b)(1) of the Act, 21 U.S.C. section 360bbb-3(b)(1), unless the authorization is terminated or revoked sooner. Performed at [facility], [place] [address] [place], [place] [address]       Radiology Studies: CT ABDOMEN PELVIS W CONTRAST  Addendum Date: 10/13/2019   ADDENDUM REPORT: 10/13/2019 13:17 ADDENDUM: Findings of worsening colitis and nonocclusive of right ovarian vein thrombus were related to the provider at the time of dictation of this addendum. These results were called by telephone at the time of interpretation on 10/13/2019 at 1:17 pm to provider [facility] , who verbally acknowledged these results. Electronically Signed   By: Zetta Bills M.D.   On: 10/13/2019 13:17   Result Date: 10/13/2019 CLINICAL DATA:  Anorectal abscess. Decline since recent hemorrhoid operation. EXAM: CT ABDOMEN AND PELVIS WITH CONTRAST TECHNIQUE: Multidetector CT imaging of the abdomen and pelvis was performed using the standard protocol following bolus  administration of intravenous contrast. CONTRAST:  116m OMNIPAQUE IOHEXOL 300 MG/ML  SOLN COMPARISON:  05/08/2019 FINDINGS: Lower chest: Incidental imaging of the lung bases is unremarkable. Hepatobiliary: Liver is normal. Minimal intra and extrahepatic biliary ductal distension likely post cholecystectomy baseline without change Pancreas: Pancreas is normal without signs of focal lesion, ductal dilation or inflammation. Spleen: Lobular appearance of the spleen. Adrenals/Urinary Tract: Left lateral limb of the adrenal gland with stable approximately 2 cm lesion arising from the left adrenal. Density values indeterminate on previous studies. Mild right adrenal thickening is unchanged. Signs of renal cortical scarring bilaterally and small left renal cysts similar to previous exams. Stomach/Bowel: No signs of acute small bowel process. Stomach is  normal. There is mild thickening of the appendix which is unchanged based on comparison with August 22, 2018. Diffuse pericolonic stranding and signs of colonic thickening throughout the colon extending through the rectum. Perianal stranding is noted and there is low attenuation with tract like morphology that extends posteriorly in the gluteal cleft, no sign of large fluid collection. The tract measures approximately 5 mm greatest with. No intraperitoneal fluid collection or signs of free air. Vascular/Lymphatic: Calcified atherosclerosis scattered throughout the abdominal aorta. No signs of aneurysm. No evidence of retroperitoneal adenopathy. No sign of pelvic lymphadenopathy. Reproductive: Uterus with unremarkable CT appearance. The nonocclusive thrombus suspected in right ovarian vein best seen on delayed image 32 of series 7. Other: No signs of free air or abscess. Musculoskeletal: No sign of acute bone finding or destructive bone process. IMPRESSION: 1. Signs of colitis favoring infectious or inflammatory colitis given distribution. Findings not classic for  diverticulitis given the long segment involvement. Correlate with recent antibiotic administration and for evidence of C difficile colitis. 2. No evidence of intraperitoneal fluid collection or signs of free air. 3. Perianal sinus tract or fistulous network is considered. No signs of perianal abscess. 4. Nonocclusive thrombus suspected in the right ovarian vein. 5. Stable appearance of left adrenal lesion. This remains indeterminate. Consider follow-up CT adrenal protocol. 6. Stable mild thickening of the appendix. A call is out to the referring provider to further discuss findings in the above case. Aortic Atherosclerosis (ICD10-I70.0). Electronically Signed: By: Zetta Bills M.D. On: 10/13/2019 13:08   DG CHEST PORT 1 VIEW  Result Date: 10/14/2019 CLINICAL DATA:  Follow-up COVID-19 virus EXAM: PORTABLE CHEST 1 VIEW COMPARISON:  None. FINDINGS: The heart size and mediastinal contours are within normal limits. Both lungs are clear. The visualized skeletal structures are unremarkable. IMPRESSION: No active disease. Electronically Signed   By: Suzy Bouchard M.D.   On: 10/14/2019 09:13    Scheduled Meds: . acidophilus  1 capsule Oral Daily  . feeding supplement  1 Container Oral BID BM  . feeding supplement (PRO-STAT SUGAR FREE 64)  30 mL Oral BID  . levothyroxine  25 mcg Oral QAC breakfast  . multivitamin with minerals  1 tablet Oral Daily  . pantoprazole  40 mg Oral QHS   Continuous Infusions: . dextrose 5 % and 0.9% NaCl 100 mL/hr at 10/13/19 2152  . heparin 1,000 Units/hr (10/14/19 0917)  . piperacillin-tazobactam (ZOSYN)  IV 3.375 g (10/14/19 1320)     LOS: 1 day   Time spent: 25 minutes.  Patrecia Pour, MD [facility] [email or web address] 10/14/2019, 4:07 PM

## 2019-10-14 NOTE — Consult Note (Addendum)
Consultation  Referring Provider: [facility]Grunz Primary Care Physician:  [facility] Primary Gastroenterologist:  Dr.Rourk  Reason for Consultation:  Diarrhea, rectal bleeding, abnormal CT  HPI: Rhonda Bailey is a 74 y.o. female, who was admitted last night, after she presented to the ER/med [employer] with complaints of progressive fatigue, weakness, diarrhea, rectal bleeding and drainage. CT of the abdomen and pelvis was done which shows diffuse pericolonic stranding and signs of colonic thickening throughout the colon extending through the rectum.  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  Labs show WBC of 15.5, hemoglobin 9.1/hematocrit 30.1/MCV 72.9 Potassium 3 , Albumin 2.6.  Lactate level was normal.  Blood cultures were obtained and are pending, COVID-19 positive however patient had been diagnosed with Covid in January 2021  GI path panel and stool for C. difficile ordered, and she had been empirically started on Zosyn.  Patient has prior GI history with [facility]/Dr. Gala Romney.  She had undergone colonoscopy in January 2020 for diarrhea and a positive Cologuard.  She was found to have multiple diverticuli and marked inflammation with ulcerations from the sigmoid and descending colon with normal-appearing transverse and ascending colon.  Biopsies were done which showed a severely active colitis with no definite chronic features.  She also had a rectal biopsy done that showed granulation tissue consistent with an ulcer. She says she did not have any specific treatment after that colonoscopy. She had hospitalization in October with pyelonephritis and a kidney stone. She says she started having significant problems with diarrhea in November 2020 which was also associated with a lot  of rectal burning and discomfort and intermittent hematochezia. She was eventually seen at [place] 07/29/2019, had been noted to have excoriated appearing external hemorrhoids, was very uncomfortable and unable to perform digital exam or anoscopy.  Had complained of more frequent bowel movements. Notes state that she did have a trial of Entocort in 2020 with improvement in symptoms. She was treated for the hemorrhoids and referred to Dr. Elpidio Anis for consideration of hemorrhoidectomy. She underwent hemorrhoidectomy February 2021.  Op note states that the anal canal was narrowed and there was a large ulcerative posterior lesion with scarring encompassing 40 to 50% of the posterior canal, also some evidence of rectal mucosal prolapse.  Biopsy was taken of the anal ulcer because of concern for malignancy.  This returned showing inflamed granulation tissue and no malignancy.  She has been struggling over the past several weeks with more frequent bowel movements but not overt diarrhea, denies any ongoing abdominal pain but says her appetite has been poor in general since November and she has lost about 30 pounds.  She complains of ongoing mucousy stools containing blood and sometimes pus.  She has had ongoing anal rectal pain and burning.  She says usually when she wakes up every morning she will have a lot of blood and mucus on her sheets. Energy level has been very poor.  Since admit she has not had any bowel movements and thus far unable to collect stool specimens.   Past Medical History:  Diagnosis Date  . Anxiety   . Anxiety disorder, unspecified   . Essential (primary) hypertension   . Gastro-esophageal reflux disease with esophagitis   . Hypokalemia   . Hypothyroidism   . Noninfective gastroenteritis and colitis, unspecified  Past Surgical History:  Procedure Laterality Date  . BIOPSY  09/02/2018   Procedure: BIOPSY;  Surgeon: Daneil Dolin, MD;  Location: [facility];   Service: Endoscopy;;  colon   . CHOLECYSTECTOMY  05/19/2011   Procedure: LAPAROSCOPIC CHOLECYSTECTOMY;  Surgeon: Jamesetta So;  Location: [facility];  Service: General;  Laterality: N/A;  . COLONOSCOPY WITH PROPOFOL N/A 09/02/2018   Dr. Gala Romney: Proctocolitis limited to the left side with some exudate versus partial pseudomembrane formation, erosions and ulceration somewhat superficial.  Terminal ileum normal.  Diverticulosis.  Descending/sigmoid colon biopsy showing severe active colitis, diagnostic features of IBD not identified.  Differential includes infection, ischemia.  Rectal biopsies with granulation tissue c/w ulcer  . CYSTOSCOPY W/ URETERAL STENT PLACEMENT Right 05/08/2019   Procedure: CYSTOSCOPY WITH RETROGRADE PYELOGRAM/URETERAL STENT PLACEMENT;  Surgeon: Festus Aloe, MD;  Location: [facility];  Service: Urology;  Laterality: Right;  . CYSTOSCOPY WITH URETEROSCOPY Right 06/11/2019   Procedure: CYSTOSCOPY WITH DIAGNOSTIC URETEROSCOPY; URETERAL STENT REMOVAL;  Surgeon: Cleon Gustin, MD;  Location: [facility];  Service: Urology;  Laterality: Right;  . HEMORRHOID SURGERY N/A 09/16/2019   Procedure: EXTENSIVE HEMORRHOIDECTOMY;  Surgeon: Aviva Signs, MD;  Location: [facility];  Service: General;  Laterality: N/A;  . RECTAL BIOPSY N/A 09/16/2019   Procedure: BIOPSY RECTAL;  Surgeon: Aviva Signs, MD;  Location: [facility];  Service: General;  Laterality: N/A;  . TUBAL LIGATION      Prior to Admission medications   Medication Sig Start Date End Date Taking? Authorizing Provider  acidophilus (RISAQUAD) CAPS capsule Take 1 capsule by mouth daily.   Yes [provider]  ALPRAZolam Duanne Moron) 0.5 MG tablet Take 0.5 mg by mouth at bedtime as needed for anxiety or sleep.  05/27/19  Yes [provider]  HYDROcodone-acetaminophen (NORCO) 5-325 MG tablet Take 1 tablet by mouth every 6 (six) hours as needed for moderate pain. 09/16/19  Yes Aviva Signs, MD  hydrocortisone (ANUSOL-HC) 2.5 % rectal  cream Place 1 application rectally 2 (two) times daily. [facility] HEMORRHOID CREAM 07/29/19  Yes Annitta Needs, NP  ibuprofen (ADVIL) 200 MG tablet Take 200-400 mg by mouth every 6 (six) hours as needed for moderate pain.   Yes [provider]  levothyroxine (SYNTHROID, LEVOTHROID) 25 MCG tablet Take 25 mcg by mouth daily before breakfast.    Yes [provider]  pantoprazole (PROTONIX) 40 MG tablet Take 40 mg by mouth at bedtime.  08/17/18  Yes [provider]  traZODone (DESYREL) 50 MG tablet Take 50 mg by mouth at bedtime as needed for sleep.  08/25/18  Yes [provider]  ondansetron (ZOFRAN ODT) 8 MG disintegrating tablet Take 1 tablet (8 mg total) by mouth every 8 (eight) hours as needed for nausea or vomiting. Patient not taking: Reported on 08/14/2019 05/11/19   Barton Dubois, MD    Current Facility-Administered Medications  Medication Dose Route Frequency Provider Last Rate Last Admin  . acetaminophen (TYLENOL) tablet 650 mg  650 mg Oral Q6H PRN Rise Patience, MD   650 mg at 10/14/19 6387   Or  . acetaminophen (TYLENOL) suppository 650 mg  650 mg Rectal Q6H PRN Rise Patience, MD      . acidophilus (RISAQUAD) capsule 1 capsule  1 capsule Oral Daily Rise Patience, MD   1 capsule at 10/14/19 0912  . ALPRAZolam Duanne Moron) tablet 0.5 mg  0.5 mg Oral QHS PRN Rise Patience, MD      . dextrose  5 %-0.9 % sodium chloride infusion   Intravenous Continuous Rise Patience, MD 100 mL/hr at 10/13/19 2152 New Bag at 10/13/19 2152  . heparin ADULT infusion 100 units/mL (25000 units/275m sodium chloride 0.45%)  1,000 Units/hr Intravenous Continuous GPatrecia Pour MD 10 mL/hr at 10/14/19 0917 1,000 Units/hr at 10/14/19 0917  . HYDROcodone-acetaminophen (NORCO/VICODIN) 5-325 MG per tablet 1 tablet  1 tablet Oral Q6H PRN KRise Patience MD   1 tablet at 10/14/19 1148  . levothyroxine (SYNTHROID) tablet 25 mcg  25 mcg Oral QAC  breakfast KRise Patience MD   25 mcg at 10/14/19 06568 . lidocaine (XYLOCAINE) 2 % viscous mouth solution 15 mL  15 mL Mouth/Throat Q3H PRN GPatrecia Pour MD   15 mL at 10/14/19 1147  . lip balm (CARMEX) ointment   Topical PRN GPatrecia Pour MD   Given at 10/14/19 1147  . ondansetron (ZOFRAN) tablet 4 mg  4 mg Oral Q6H PRN KRise Patience MD       Or  . ondansetron ([facility] injection 4 mg  4 mg Intravenous Q6H PRN KRise Patience MD      . pantoprazole (PROTONIX) EC tablet 40 mg  40 mg Oral QHS KRise Patience MD   40 mg at 10/13/19 2155  . piperacillin-tazobactam (ZOSYN) IVPB 3.375 g  3.375 g Intravenous Q8H Wofford, Drew A, RPH 12.5 mL/hr at 10/14/19 1320 3.375 g at 10/14/19 1320  . traZODone (DESYREL) tablet 50 mg  50 mg Oral QHS PRN KRise Patience MD   50 mg at 10/13/19 2155    Allergies as of 10/13/2019 - Review Complete 10/13/2019  Allergen Reaction Noted  . Tramadol  05/08/2019    Family History  Problem Relation Age of Onset  . Cancer Father   . Heart disease Sister   . Heart disease Brother   . Cancer Brother   . Anesthesia problems Neg Hx   . Hypotension Neg Hx   . Malignant hyperthermia Neg Hx   . Pseudochol deficiency Neg Hx     Social History   Socioeconomic History  . Marital status: Divorced    Spouse name: Not on file  . Number of children: Not on file  . Years of education: Not on file  . Highest education level: Not on file  Occupational History  . Not on file  Tobacco Use  . Smoking status: Never Smoker  . Smokeless tobacco: Never Used  Substance and Sexual Activity  . Alcohol use: No  . Drug use: No  . Sexual activity: Not Currently  Other Topics Concern  . Not on file  Social History Narrative  . Not on file   Social Determinants of Health   Financial Resource Strain: Low Risk   . Difficulty of Paying Living Expenses: Not hard at all  Food Insecurity: No Food Insecurity  . Worried About RCharity fundraiserin  the Last Year: Never true  . Ran Out of Food in the Last Year: Never true  Transportation Needs: No Transportation Needs  . Lack of Transportation (Medical): No  . Lack of Transportation (Non-Medical): No  Physical Activity: Sufficiently Active  . Days of Exercise per Week: 5 days  . Minutes of Exercise per Session: 30 min  Stress: No Stress Concern Present  . Feeling of Stress : Not at all  Social Connections: Moderately Isolated  . Frequency of Communication with Friends and Family: More than three times a week  .  Frequency of Social Gatherings with Friends and Family: More than three times a week  . Attends Religious Services: Never  . Active Member of Clubs or Organizations: No  . Attends Archivist Meetings: Never  . Marital Status: Divorced  Human resources officer Violence: Not At Risk  . Fear of Current or Ex-Partner: No  . Emotionally Abused: No  . Physically Abused: No  . Sexually Abused: No    Review of Systems: Pertinent positive and negative review of systems were noted in the above HPI section.  All other review of systems was otherwise negative.  Physical Exam: Vital signs in last 24 hours: Temp:  [97.7 F (36.5 C)-99 F (37.2 C)] 98 F (36.7 C) (03/09 1432) Pulse Rate:  [74-94] 84 (03/09 1432) Resp:  [18] 18 (03/09 1432) BP: (114-138)/(58-77) 116/65 (03/09 1432) SpO2:  [95 %-99 %] 97 % (03/09 1432) Weight:  [73 kg] 73 kg (03/08 2015) Last BM Date: 10/13/19 General:   Alert,  Well-developed, well-nourished, older white female pleasant and cooperative in NAD Head:  Normocephalic and atraumatic. Eyes:  Sclera clear, no icterus.   Conjunctiva pink. Ears:  Normal auditory acuity. Nose:  No deformity, discharge,  or lesions. Mouth:  No deformity or lesions.   Neck:  Supple; no masses or thyromegaly. Lungs:  Clear throughout to auscultation.   No wheezes, crackles, or rhonchi. Heart:  Regular rate and rhythm; no murmurs, clicks, rubs,  or gallops. Abdomen:   Soft,nontender, BS active,nonpalp mass or hsm.   Rectal: Not done today Msk:  Symmetrical without gross deformities. . Pulses:  Normal pulses noted. Extremities:  Without clubbing or edema. Neurologic:  Alert and  oriented x4;  grossly normal neurologically. Skin:  Intact without significant lesions or rashes.. Psych:  Alert and cooperative. Normal mood and affect.  Intake/Output from previous day: 03/08 0701 - 03/09 0700 In: 120 [P.O.:120] Out: 650 [Urine:650] Intake/Output this shift: Total I/O In: 1295.9 [P.O.:600; I.V.:617.7; IV Piggyback:78.2] Out: 150 [Urine:150]  Lab Results: Recent Labs    10/13/19 1131 10/14/19 0506  WBC 15.5* 13.2*  HGB 9.1* 8.5*  HCT 30.1* 28.8*  PLT 502* 451*   BMET Recent Labs    10/13/19 1131 10/14/19 0506  NA 139 143  K 3.0* 3.7  CL 104 107  CO2 26 29  GLUCOSE 107* 116*  BUN 9 9  CREATININE 0.96 1.05*  CALCIUM 8.3* 8.3*   LFT Recent Labs    10/13/19 1131  PROT 6.5  ALBUMIN 2.6*  AST 12*  ALT 9  ALKPHOS 74  BILITOT 0.6   PT/INR Recent Labs    10/14/19 0750  LABPROT 13.7  INR 1.1   Hepatitis Panel No results for input(s): HEPBSAG, HCVAB, HEPAIGM, HEPBIGM in the last 72 hours.     IMPRESSION:  #5 74 year old white female with 4 to 38-monthhistory of alteration in bowel habits with more frequent looser stools, and gradual worsening of anal rectal discomfort/burning, and passage of mucoid and bloody stools.  Colonoscopy a little over a year ago showed a left-sided colitis, but biopsies were not diagnostic for IBD.  She did receive a course of Entocort at some point in 2020 with some initial improvement in symptoms.  Referred for hemorrhoidectomy February 2021.  Hemorrhoidectomy was done per Dr. JLeander Rams and noted at time of exam to have a narrowed scarred anal canal with a large ulcerated posterior lesion encompassing 40 to 50% of the posterior canal.  Biopsies showed inflamed granulation tissue, no  malignancy.  CT  scan yesterday now showing a diffuse colitis and in addition a perianal sinus tract or fistulous network, no perianal abscess  High suspicion for IBD/Crohn's especially with anorectal findings at recent hemorrhoidectomy and above CT imaging.  #2 microcytic anemia-probable iron deficiency #3 diverticulosis #4 weight loss secondary to above #5 COVID-19 infection January 2021-recovered  Plan; regular diet today Await GI path panel and stool for C. difficile PCR.  Would like to exclude C. difficile prior to proceeding with colonoscopy. She will need colonoscopy this admission, probably prep tomorrow and plan for procedure Thursday.  No specific indication for Zosyn currently, will discontinue Check iron studies, sed rate and CRP  Thank you will follow with you     Amy EsterwoodPA-C  10/14/2019, 2:49 PM    Attending physician's note   I have taken a history, examined the patient and reviewed the chart. I agree with the Advanced Practitioner's note, impression and recommendations.  74 year old female with diffuse colonic thickening with pericolonic stranding and ?  Perianal fistula.  CT findings are concerning for inflammatory bowel disease but will need to exclude infectious etiology or C. difficile exacerbating underlying IBD  Check GI pathogen panel Will plan for colonoscopy with biopsies during this admission based on results of stool studies  Soft diet as tolerated  Discontinue Zosyn, not indicated from GI standpoint  GI will continue to follow K. Denzil Magnuson , MD [phone]

## 2019-10-14 NOTE — Progress Notes (Signed)
Initial Nutrition Assessment  RD working remotely.   DOCUMENTATION CODES:   Not applicable  INTERVENTION:  - will order Boost Breeze BID, each supplement provides 250 kcal and 9 grams of protein. - will order 30 mL Prostat BID, each supplement provides 100 kcal and 15 grams of protein. - will order [employer] with 2% milk with breakfast meals. - will order daily multivitamin with minerals.    NUTRITION DIAGNOSIS:   Inadequate oral intake related to acute illness, nausea, vomiting as evidenced by per patient/family report.  GOAL:   Patient will meet greater than or equal to 90% of their needs  MONITOR:   PO intake, Supplement acceptance, Labs, Weight trends  REASON FOR ASSESSMENT:   Malnutrition Screening Tool  ASSESSMENT:   74 y.o. female with history of hypothyroidism who had hemorrhoidectomy on 09/16/19 following which patient she has been having persistent diarrhea (at least about 10 episodes/day with some blood in it). She also noticed discharge, which is sometimes bloody, from perianal area. She reported intermittent N/V with associated poor appetite.  Diet advanced to CLD today at 0900 and then to Soft at 1238. No intakes documented today. Per flow sheet weight yesterday was 161 lb and weight on 09/22/19 was 164 lb which indicates 3 lb weight loss (1.8% body weight) in the past 1 month; not significant for time frame. Weight on 08/14/19 was 167 lb which indicates 6 lb weight loss (3.6% body weight) in the past 2 months; not significant for time frame.   Per notes: - colitis with possible perianal fistulous tract - R ovarian thrombus - anemia - hypokalemia--resolved    Labs reviewed; creatinine: 1.05 mg/dl, Ca: 8.3 mg/dl, GFR: 52 ml/min. Medications reviewed; 40 mg oral protonix/day, 40 mEq Klor-Con x1 dose 3/8.  IVF; D5-NS @ 100 ml/hr (408 kcal)    NUTRITION - FOCUSED PHYSICAL EXAM:  unable to complete at this time.   Diet Order:   Diet Order             DIET SOFT Room service appropriate? Yes; Fluid consistency: Thin  Diet effective now              EDUCATION NEEDS:   No education needs have been identified at this time  Skin:  Skin Assessment: Reviewed RN Assessment  Last BM:  3/8  Height:   Ht Readings from Last 1 Encounters:  10/13/19 5' 8"  (1.727 m)    Weight:   Wt Readings from Last 1 Encounters:  10/13/19 73 kg    Estimated Nutritional Needs:  Kcal:  1900-2100 kcal Protein:  80-95 grams Fluid:  >/= 2.2 L/day     Jarome Matin, MS, RD, LDN, CNSC Inpatient Clinical Dietitian RD pager # available in [facility]  After hours/weekend pager # available in [facility]

## 2019-10-15 DIAGNOSIS — R197 Diarrhea, unspecified: Secondary | ICD-10-CM

## 2019-10-15 DIAGNOSIS — D500 Iron deficiency anemia secondary to blood loss (chronic): Secondary | ICD-10-CM

## 2019-10-15 LAB — BASIC METABOLIC PANEL
Anion gap: 5 (ref 5–15)
BUN: 8 mg/dL (ref 8–23)
CO2: 28 mmol/L (ref 22–32)
Calcium: 8.1 mg/dL — ABNORMAL LOW (ref 8.9–10.3)
Chloride: 108 mmol/L (ref 98–111)
Creatinine, Ser: 0.79 mg/dL (ref 0.44–1.00)
GFR calc Af Amer: >60 mL/min (ref >60)
GFR calc non Af Amer: >60 mL/min (ref >60)
Glucose, Bld: 140 mg/dL — ABNORMAL HIGH (ref 70–99)
Potassium: 3.2 mmol/L — ABNORMAL LOW (ref 3.5–5.1)
Sodium: 141 mmol/L (ref 135–145)

## 2019-10-15 LAB — CBC
HCT: 29.4 % — ABNORMAL LOW (ref 36.0–46.0)
Hemoglobin: 8.5 g/dL — ABNORMAL LOW (ref 12.0–15.0)
MCH: 21.5 pg — ABNORMAL LOW (ref 26.0–34.0)
MCHC: 28.9 g/dL — ABNORMAL LOW (ref 30.0–36.0)
MCV: 74.4 fL — ABNORMAL LOW (ref 80.0–100.0)
Platelets: 456 10*3/uL — ABNORMAL HIGH (ref 150–400)
RBC: 3.95 MIL/uL (ref 3.87–5.11)
RDW: 18.5 % — ABNORMAL HIGH (ref 11.5–15.5)
WBC: 15 10*3/uL — ABNORMAL HIGH (ref 4.0–10.5)
nRBC: 0 % (ref 0.0–0.2)

## 2019-10-15 LAB — C DIFFICILE QUICK SCREEN W PCR REFLEX
C Diff antigen: NEGATIVE
C Diff interpretation: NOT DETECTED
C Diff toxin: NEGATIVE

## 2019-10-15 LAB — HEPARIN LEVEL (UNFRACTIONATED)
Heparin Unfractionated: 0.19 IU/mL — ABNORMAL LOW (ref 0.30–0.70)
Heparin Unfractionated: <0.1 IU/mL — ABNORMAL LOW (ref 0.30–0.70)

## 2019-10-15 MED ORDER — PREDNISONE 20 MG PO TABS
20.0000 mg | ORAL_TABLET | Freq: Every day | ORAL | Status: DC
  Administered 2019-10-15 – 2019-10-16 (×2): 20 mg via ORAL
  Filled 2019-10-15 (×2): qty 1

## 2019-10-15 MED ORDER — POTASSIUM CHLORIDE CRYS ER 20 MEQ PO TBCR
40.0000 meq | EXTENDED_RELEASE_TABLET | Freq: Two times a day (BID) | ORAL | Status: DC
  Administered 2019-10-15 (×2): 40 meq via ORAL
  Filled 2019-10-15 (×2): qty 2

## 2019-10-15 NOTE — Progress Notes (Addendum)
Rhonda Bailey  CC:  Diarrhea, rectal bleeding, abnormal CT  Subjective:  Feeling better.  Not passing blood, mostly mucus.  Not really any BM in the past 24 hours.  Cdiff negative, pathogen panel pending.  Objective:  Vital signs in last 24 hours: Temp:  [97.5 F (36.4 C)-98 F (36.7 C)] 97.5 F (36.4 C) (03/10 0413) Pulse Rate:  [79-86] 79 (03/10 0413) Resp:  [16-18] 16 (03/10 0413) BP: (116-124)/(51-65) 120/51 (03/10 0413) SpO2:  [95 %-97 %] 95 % (03/10 0413) Last BM Date: 10/15/19 General:  Alert, Well-developed, in NAD Heart:  Regular rate and rhythm; no murmurs Pulm:  CTAB.  No increased WOB. Abdomen:  Soft, non-distended.  BS present.  Non-tender. Extremities:  Without edema. Neurologic:  Alert and oriented x 4;  grossly normal neurologically. Psych:  Alert and cooperative. Normal mood and affect.  Intake/Output from previous day: 03/09 0701 - 03/10 0700 In: 3568.3 [P.O.:1410; I.V.:2038.2; IV Piggyback:120.2] Out: 150 [Urine:150] Intake/Output this shift: Total I/O In: 573.4 [P.O.:240; I.V.:333.4] Out: -   Lab Results: Recent Labs    10/13/19 1131 10/14/19 0506 10/15/19 0447  WBC 15.5* 13.2* 15.0*  HGB 9.1* 8.5* 8.5*  HCT 30.1* 28.8* 29.4*  PLT 502* 451* 456*   BMET Recent Labs    10/13/19 1131 10/14/19 0506 10/15/19 0447  NA 139 143 141  K 3.0* 3.7 3.2*  CL 104 107 108  CO2 26 29 28   GLUCOSE 107* 116* 140*  BUN 9 9 8   CREATININE 0.96 1.05* 0.79  CALCIUM 8.3* 8.3* 8.1*   LFT Recent Labs    10/13/19 1131  PROT 6.5  ALBUMIN 2.6*  AST 12*  ALT 9  ALKPHOS 74  BILITOT 0.6   PT/INR Recent Labs    10/14/19 0750  LABPROT 13.7  INR 1.1   CT ABDOMEN PELVIS W CONTRAST  Addendum Date: 10/13/2019   ADDENDUM REPORT: 10/13/2019 13:17 ADDENDUM: Findings of worsening colitis and nonocclusive of right ovarian vein thrombus were related to the provider at the time of dictation of this addendum. These results were  called by telephone at the time of interpretation on 10/13/2019 at 1:17 pm to provider Rhonda Bailey , who verbally acknowledged these results. Electronically Signed   By: Rhonda Bailey M.D.   On: 10/13/2019 13:17   Result Date: 10/13/2019 CLINICAL DATA:  Anorectal abscess. Decline since recent hemorrhoid operation. EXAM: CT ABDOMEN AND PELVIS WITH CONTRAST TECHNIQUE: Multidetector CT imaging of the abdomen and pelvis was performed using the standard protocol following bolus administration of intravenous contrast. CONTRAST:  177m OMNIPAQUE IOHEXOL 300 MG/ML  SOLN COMPARISON:  05/08/2019 FINDINGS: Lower chest: Incidental imaging of the lung bases is unremarkable. Hepatobiliary: Liver is normal. Minimal intra and extrahepatic biliary ductal distension likely post cholecystectomy baseline without change Pancreas: Pancreas is normal without signs of focal lesion, ductal dilation or inflammation. Spleen: Lobular appearance of the spleen. Adrenals/Urinary Tract: Left lateral limb of the adrenal gland with stable approximately 2 cm lesion arising from the left adrenal. Density values indeterminate on previous studies. Mild right adrenal thickening is unchanged. Signs of renal cortical scarring bilaterally and small left renal cysts similar to previous exams. Stomach/Bowel: No signs of acute small bowel process. Stomach is normal. There is mild thickening of the appendix which is unchanged based on comparison with August 22, 2018. Diffuse pericolonic stranding and signs of colonic thickening throughout the colon extending through the rectum. Perianal stranding is noted and there is low attenuation with tract  like morphology that extends posteriorly in the gluteal cleft, no sign of large fluid collection. The tract measures approximately 5 mm greatest with. No intraperitoneal fluid collection or signs of free air. Vascular/Lymphatic: Calcified atherosclerosis scattered throughout the abdominal aorta. No signs of  aneurysm. No evidence of retroperitoneal adenopathy. No sign of pelvic lymphadenopathy. Reproductive: Uterus with unremarkable CT appearance. The nonocclusive thrombus suspected in right ovarian vein best seen on delayed image 32 of series 7. Other: No signs of free air or abscess. Musculoskeletal: No sign of acute bone finding or destructive bone process. IMPRESSION: 1. Signs of colitis favoring infectious or inflammatory colitis given distribution. Findings not classic for diverticulitis given the long segment involvement. Correlate with recent antibiotic administration and for evidence of C Bailey colitis. 2. No evidence of intraperitoneal fluid collection or signs of free air. 3. Perianal sinus tract or fistulous network is considered. No signs of perianal abscess. 4. Nonocclusive thrombus suspected in the right ovarian vein. 5. Stable appearance of left adrenal lesion. This remains indeterminate. Consider follow-up CT adrenal protocol. 6. Stable mild thickening of the appendix. A call is out to the referring provider to further discuss findings in the above case. Aortic Atherosclerosis (ICD10-I70.0). Electronically Signed: By: Rhonda Bailey M.D. On: 10/13/2019 13:08   DG CHEST PORT 1 VIEW  Result Date: 10/14/2019 CLINICAL DATA:  Follow-up COVID-19 virus EXAM: PORTABLE CHEST 1 VIEW COMPARISON:  None. FINDINGS: The heart size and mediastinal contours are within normal limits. Both lungs are clear. The visualized skeletal structures are unremarkable. IMPRESSION: No active disease. Electronically Signed   By: Rhonda Bailey M.D.   On: 10/14/2019 09:13   Assessment / Plan: #14 74 year old white female with 4 to 65-monthhistory of alteration in bowel habits with more frequent looser stools, and gradual worsening of anal rectal discomfort/burning, and passage of mucoid and bloody stools.  Colonoscopy a little over a year ago showed a left-sided colitis, but biopsies were not diagnostic for IBD.  She did  receive a course of Entocort at some point in 2020 with some initial improvement in symptoms.  Referred for hemorrhoidectomy February 2021.  Hemorrhoidectomy was done per Rhonda Bailey and noted at time of exam to have a narrowed scarred anal canal with a large ulcerated posterior lesion encompassing 40 to 50% of the posterior canal.  Biopsies showed inflamed granulation tissue, no malignancy.  CT scan this admission now showing a diffuse colitis and in addition a perianal sinus tract or fistulous network, no perianal abscess.  High suspicion for IBD/Crohn's especially with anorectal findings at recent hemorrhoidectomy and above CT imaging.  Cdiff negative.  GI pathogen panel pending.  Sed rate and CRP both elevated.  #2 microcytic anemia-iron studies c/w AOCD #3 diverticulosis #4 weight loss secondary to above #5 COVID-19 infection January 2021-recovered #6 Right ovarian vein thrombus:  Has been started on IV heparin and will try to transition her to PO anticoagulation if no sign of GI bleeding.  -She has improved.  Will plan for prednisone 20 mg daily for 2 weeks then to taper by 5 mg each week thereafter (15 mg for one week, then 10 mg for one week, then 5 mg for one week, then discontinue).   -I have scheduled her for an OV follow-up at which time we can discuss and schedule colonoscopy with Dr. NSilverio Decamp      LOS: 2 days   JLaban Emperor Zehr  10/15/2019, 12:35 PM   Attending physician's Bailey   I have taken an  interval history, reviewed the chart and examined the patient. I agree with the Advanced Practitioner's Bailey, impression and recommendations.   Rhonda Bailey negative  CT abdomen and pelvis findings concerning for IBD, she had left-sided colitis last year, biopsy showed acute colitis was empirically treated with Entocort.  Iron deficiency: Start oral ferrous sulfate 1 tablet twice daily with meals  Elevated CRP and ESR  Start prednisone 20 mg daily for 2 weeks  followed by 5 mg taper every week We will plan for colonoscopy as outpatient in 6 to 8 weeks GI follow-up next available appointment in 3 to 4 weeks.  If able to tolerate adequate p.o. intake with no further episodes of diarrhea, okay to discharge home tomorrow from GI standpoint  K. Denzil Magnuson , MD [phone]

## 2019-10-15 NOTE — Discharge Instructions (Signed)
Get some Ensure and try to drink 2-3 daily between meals.

## 2019-10-15 NOTE — Progress Notes (Signed)
PROGRESS NOTE  Rhonda Bailey  NTI:[id number] DOB: 02-Oct-1945 DOA: 10/13/2019 PCP: [facility]   Brief Narrative: Rhonda Bailey is a 74 y.o. female with history of hypothyroidism who had recent hemorrhoidectomy on September 16, 2019 following which patient states she has been having persistent diarrhea at least about 10 episodes a day with some blood in it.  Also noticed that some discharge from the perianal area.  Sometimes bloody.  Denies any fever chills.  Has been having poor appetite at times vomiting.  Given the symptoms patient presents to the ER admits in [facility].  ED Course: CT abdomen pelvis done in the ER shows features concerning for colitis with possible sinus/fistulous tract around the perianal area.  ER physician discussed with on-call general surgery who advised GI consult and ER physician discussed with GI who advised patient to be placed on Zosyn and check stool studies for C. difficile.  Labs reveal hemoglobin is around 9.1 that was comparable to recent. WBC count of 15.5 platelets 867 complete metabolic panel shows mild hypokalemia.  Patient was started on Zosyn IV fluids and admitted for further management.  There was also finding of a right ovarian vein nonocclusive thrombus.  Assessment & Plan: Principal Problem:   Colitis with fistula Active Problems:   Colitis   Hypokalemia   Microcytic anemia  Colitis with possible perianal fistula:  C. difficile negative.  GI pathogen panel pending.  Prolonged symptoms with perianal fistula and ulceration.  Previous biopsy negative for ulcerative colitis. Seen and followed by GI.  Symptomatically improving. Starting on prednisone taper and probable outpatient follow-up for colonoscopy and biopsy. Discontinue all antibiotics. Diarrhea and bleeding has improved.  Right ovarian vein nonocclusive thrombus:  Significance unknown.  Patient with no evidence of septic ovarian vein thrombosis.  May be inflammatory  or aggravated by recent COVID-19 infection.  No indication for therapeutic anticoagulation.    Chronic microcytic anemia: Likely a mix of chronic disease and iron deficiency Hemoglobin is fairly stable.  Hypokalemia: Resolved with supplementation  History of SARS-CoV-2 infection: Asymptomatic noted on preoperative screening Jan 11. Was tested at admission and again positive, no symptoms, this is consistent with noninfectious carrier state for which no further work up, treatment, or isolation is required.  Hypothyroidism:  Continue synthroid  Left adrenal lesion: Seen on imaging, remains indeterminate.  Consider follow-up CT adrenal protocol.  Patient had similar adrenal abnormalities in her CT scans more than a year ago.  Probably benign.  Anxiety, insomnia:  Xanax, trazodone prn  GERD:  Continue PPI  DVT prophylaxis: IV heparin.  Will discontinue. Code Status: Full Family Communication: None  Disposition Plan: Patient from home.  Anticipate discharge plan to home.  Barriers to discharge, pending final GI plan.  Possible colonoscopy.  Consultants:   GI  OB/GYN, Dr. Elonda Husky by phone by previous provider.  Procedures:   None  Antimicrobials:  Zosyn 3/8-3/9  Subjective: Patient seen and examined.  Denies any abdominal pain.  Denies any nausea vomiting.  Very small amount of mushy stool, 3 times since yesterday morning.  Denies any cramping.  No rectal discomfort.  No blood in the stool.  Objective: Vitals:   10/14/19 1432 10/14/19 2043 10/15/19 0413 10/15/19 1329  BP: 116/65 (!) 124/56 (!) 120/51 (!) 142/60  Pulse: 84 86 79 89  Resp: 18 16 16 17   Temp: 98 F (36.7 C) 97.7 F (36.5 C) (!) 97.5 F (36.4 C) 98.3 F (36.8 C)  TempSrc:  Oral Oral  Oral  SpO2: 97% 97% 95% 96%  Weight:      Height:        Intake/Output Summary (Last 24 hours) at 10/15/2019 1337 Last data filed at 10/15/2019 1019 Gross per 24 hour  Intake 3204.76 ml  Output 0 ml  Net 3204.76 ml    Filed Weights   10/13/19 1055 10/13/19 2015  Weight: 75.5 kg 73 kg   Gen: Comfortable patient is comfortable on room air. Pulm: Non-labored breathing. Clear to auscultation bilaterally.  CV: Regular rate and rhythm. No murmur, rub, or gallop. No JVD, no pedal edema. GI: Abdomen soft, non-tender, non-distended, with normoactive bowel sounds. No organomegaly or masses felt. Ext: Warm, no deformities Neuro: Alert and oriented. No focal neurological deficits. Psych: Judgement and insight appear normal. Mood & affect appropriate.   Data Reviewed: I have personally reviewed following labs and imaging studies  CBC: Recent Labs  Lab 10/13/19 1131 10/14/19 0506 10/15/19 0447  WBC 15.5* 13.2* 15.0*  NEUTROABS 11.9*  --   --   HGB 9.1* 8.5* 8.5*  HCT 30.1* 28.8* 29.4*  MCV 72.9* 73.5* 74.4*  PLT 502* 451* 222*   Basic Metabolic Panel: Recent Labs  Lab 10/13/19 1131 10/14/19 0506 10/15/19 0447  NA 139 143 141  K 3.0* 3.7 3.2*  CL 104 107 108  CO2 26 29 28   GLUCOSE 107* 116* 140*  BUN 9 9 8   CREATININE 0.96 1.05* 0.79  CALCIUM 8.3* 8.3* 8.1*   GFR: Estimated Creatinine Clearance: 62.2 mL/min (by C-G formula based on SCr of 0.79 mg/dL). Liver Function Tests: Recent Labs  Lab 10/13/19 1131  AST 12*  ALT 9  ALKPHOS 74  BILITOT 0.6  PROT 6.5  ALBUMIN 2.6*   Recent Labs  Lab 10/13/19 1131  LIPASE 20   No results for input(s): AMMONIA in the last 168 hours. Coagulation Profile: Recent Labs  Lab 10/14/19 0750  INR 1.1   Cardiac Enzymes: No results for input(s): CKTOTAL, CKMB, CKMBINDEX, TROPONINI in the last 168 hours. BNP (last 3 results) No results for input(s): PROBNP in the last 8760 hours. HbA1C: No results for input(s): HGBA1C in the last 72 hours. CBG: No results for input(s): GLUCAP in the last 168 hours. Lipid Profile: No results for input(s): CHOL, HDL, LDLCALC, TRIG, CHOLHDL, LDLDIRECT in the last 72 hours. Thyroid Function Tests: No results  for input(s): TSH, T4TOTAL, FREET4, T3FREE, THYROIDAB in the last 72 hours. Anemia Panel: Recent Labs    10/14/19 1642 10/14/19 1651  VITAMINB12 914  --   FOLATE 18.8  --   FERRITIN 67  --   TIBC 188*  --   IRON 12*  --   RETICCTPCT  --  1.3   Urine analysis:    Component Value Date/Time   COLORURINE STRAW (A) 10/13/2019 1135   APPEARANCEUR CLEAR 10/13/2019 1135   LABSPEC <1.005 (L) 10/13/2019 1135   PHURINE 7.0 10/13/2019 1135   GLUCOSEU NEGATIVE 10/13/2019 1135   HGBUR NEGATIVE 10/13/2019 1135   BILIRUBINUR NEGATIVE 10/13/2019 1135   KETONESUR NEGATIVE 10/13/2019 1135   PROTEINUR NEGATIVE 10/13/2019 1135   NITRITE NEGATIVE 10/13/2019 1135   LEUKOCYTESUR NEGATIVE 10/13/2019 1135   Recent Results (from the past 240 hour(s))  Blood culture (routine x 2)     Status: None (Preliminary result)   Collection Time: 10/13/19  2:45 PM   Specimen: BLOOD  Result Value Ref Range Status   Specimen Description   Final    BLOOD RIGHT ANTECUBITAL Performed at [facility], [address] [place], [place] [address]    Special Requests   Final    BOTTLES DRAWN AEROBIC AND ANAEROBIC Blood Culture adequate volume Performed at [facility], [address] [place], [place] [address]    Culture   Final    NO GROWTH 2 DAYS Performed at [facility], [place] [address], [place], [place] [address]    Report Status PENDING  Incomplete  Blood culture (routine x 2)     Status: None (Preliminary result)   Collection Time: 10/13/19  2:50 PM   Specimen: BLOOD LEFT HAND  Result Value Ref Range Status   Specimen Description   Final    BLOOD LEFT HAND Performed at [facility], [place] [address] [place], [place] [address]    Special Requests   Final    BOTTLES DRAWN AEROBIC AND ANAEROBIC Blood Culture results may not be optimal due to an inadequate volume of blood received in culture bottles Performed at [facility], [address] [place], [place]  [address]    Culture   Final    NO GROWTH 2 DAYS Performed at [facility], [place] [address] [place], [place] [address]    Report Status PENDING  Incomplete  SARS CORONAVIRUS 2 (TAT 6-24 HRS) Nasopharyngeal Nasopharyngeal Swab     Status: Abnormal   Collection Time: 10/13/19  2:50 PM   Specimen: Nasopharyngeal Swab  Result Value Ref Range Status   SARS Coronavirus 2 POSITIVE (A) NEGATIVE Final    Comment: EMAILED LORI B. AT 0545 ON 10/14/2019 BY MESSAN H. (NOTE) SARS-CoV-2 target nucleic acids are DETECTED. The SARS-CoV-2 RNA is generally detectable in upper and lower respiratory specimens during the acute phase of infection. Positive results are indicative of the presence of SARS-CoV-2 RNA. Clinical correlation with patient history and other diagnostic information is  necessary to determine patient infection status. Positive results do not rule out bacterial infection or co-infection with other viruses.  The expected result is Negative. Fact Sheet for Patients: SugarRoll.be Fact Sheet for Healthcare Providers: https://www.woods-mathews.com/ This test is not yet approved or cleared by the [place] FDA and  has been authorized for detection and/or diagnosis of SARS-CoV-2 by FDA under an Emergency Use Authorization (EUA). This EUA will remain  in effect (meaning this test can be used) for the duration of the COVID-19 declaration  under Section 564(b)(1) of the Act, 21 U.S.C. section 360bbb-3(b)(1), unless the authorization is terminated or revoked sooner. Performed at [facility], [place] [address], [place], [place] [address]   C difficile quick scan w PCR reflex     Status: None   Collection Time: 10/15/19  2:50 AM   Specimen: STOOL  Result Value Ref Range Status   C Diff antigen NEGATIVE NEGATIVE Final   C Diff toxin NEGATIVE NEGATIVE Final   C Diff interpretation No C. difficile detected.  Final    Comment: Performed at  [facility], [place] [address], [place], [place] [address]      Radiology Studies: DG CHEST PORT 1 VIEW  Result Date: 10/14/2019 CLINICAL DATA:  Follow-up COVID-19 virus EXAM: PORTABLE CHEST 1 VIEW COMPARISON:  None. FINDINGS: The heart size and mediastinal contours are within normal limits. Both lungs are clear. The visualized skeletal structures are unremarkable. IMPRESSION: No active disease. Electronically Signed   By: Suzy Bouchard M.D.   On: 10/14/2019 09:13    Scheduled Meds: .  acidophilus  1 capsule Oral Daily  . feeding supplement  1 Container Oral BID BM  . feeding supplement (PRO-STAT SUGAR FREE 64)  30 mL Oral BID  . levothyroxine  25 mcg Oral QAC breakfast  . multivitamin with minerals  1 tablet Oral Daily  . pantoprazole  40 mg Oral QHS  . potassium chloride  40 mEq Oral BID  . predniSONE  20 mg Oral QAC breakfast   Continuous Infusions:    LOS: 2 days   Time spent: 25 minutes.  Barb Merino, MD [facility] [email or web address] 10/15/2019, 1:37 PM

## 2019-10-15 NOTE — Progress Notes (Signed)
ANTICOAGULATION CONSULT NOTE - Follow Up Consult  Pharmacy Consult for Heparin Indication: rt ovarian vein thrombus  Allergies  Allergen Reactions  . Tramadol     Says "blew my head up."    Patient Measurements: Height: 5' 8"  (172.7 cm) Weight: 160 lb 14.4 oz (73 kg) IBW/kg (Calculated) : 63.9 Heparin Dosing Weight:   Vital Signs: Temp: 97.5 F (36.4 C) (03/10 0413) Temp Source: Oral (03/10 0413) BP: 120/51 (03/10 0413) Pulse Rate: 79 (03/10 0413)  Labs: Recent Labs    10/13/19 1131 10/13/19 1131 10/14/19 0506 10/14/19 0750 10/14/19 1651 10/15/19 0447  HGB 9.1*   < > 8.5*  --   --  8.5*  HCT 30.1*  --  28.8*  --   --  29.4*  PLT 502*  --  451*  --   --  456*  APTT  --   --   --  29  --   --   LABPROT  --   --   --  13.7  --   --   INR  --   --   --  1.1  --   --   HEPARINUNFRC  --   --   --   --  <0.10* 0.19*  CREATININE 0.96  --  1.05*  --   --  0.79   < > = values in this interval not displayed.    Estimated Creatinine Clearance: 62.2 mL/min (by C-G formula based on SCr of 0.79 mg/dL).   Medications:  Infusions:  . dextrose 5 % and 0.9% NaCl 100 mL/hr at 10/15/19 0600  . heparin 1,250 Units/hr (10/15/19 0600)    Assessment: Patient with heparin level below goal.  No heparin issues per RN.  Goal of Therapy:  Heparin level 0.3-0.5 units/ml Monitor platelets by anticoagulation protocol: Yes   Plan:  Increase heparin to 1350 units/hr Recheck level at [place], [place] 10/15/2019,6:17 AM

## 2019-10-16 LAB — MAGNESIUM: Magnesium: 2.4 mg/dL (ref 1.7–2.4)

## 2019-10-16 LAB — PHOSPHORUS: Phosphorus: 3.6 mg/dL (ref 2.5–4.6)

## 2019-10-16 LAB — BASIC METABOLIC PANEL
Anion gap: 8 (ref 5–15)
BUN: 9 mg/dL (ref 8–23)
CO2: 25 mmol/L (ref 22–32)
Calcium: 8.6 mg/dL — ABNORMAL LOW (ref 8.9–10.3)
Chloride: 110 mmol/L (ref 98–111)
Creatinine, Ser: 0.8 mg/dL (ref 0.44–1.00)
GFR calc Af Amer: >60 mL/min (ref >60)
GFR calc non Af Amer: >60 mL/min (ref >60)
Glucose, Bld: 117 mg/dL — ABNORMAL HIGH (ref 70–99)
Potassium: 3.8 mmol/L (ref 3.5–5.1)
Sodium: 143 mmol/L (ref 135–145)

## 2019-10-16 LAB — HEPARIN LEVEL (UNFRACTIONATED): Heparin Unfractionated: <0.1 IU/mL — ABNORMAL LOW (ref 0.30–0.70)

## 2019-10-16 MED ORDER — PREDNISONE 5 MG PO TABS: ORAL_TABLET | ORAL | 0 refills | Status: AC

## 2019-10-16 MED ORDER — ALPRAZOLAM 0.5 MG PO TABS: 0.5000 mg | ORAL_TABLET | Freq: Every evening | ORAL | 0 refills | Status: AC | PRN

## 2019-10-16 MED ORDER — ONDANSETRON 8 MG PO TBDP: 8.0000 mg | ORAL_TABLET | Freq: Three times a day (TID) | ORAL | 0 refills | Status: DC | PRN

## 2019-10-16 MED ORDER — PREDNISONE 20 MG PO TABS: 20.0000 mg | ORAL_TABLET | Freq: Every day | ORAL | 0 refills | Status: DC

## 2019-10-16 NOTE — Discharge Summary (Signed)
Physician Discharge Summary  Rhonda Bailey ZOX:[id number] DOB: February 05, 1946 DOA: 10/13/2019  PCP: [facility]  Admit date: 10/13/2019 Discharge date: 10/16/2019  Admitted From: Home Disposition: Home  Recommendations for Outpatient Follow-up:  1. Follow up with PCP in 1-2 weeks 2. Follow-up with GI as scheduled.  Office will call with appointment.  Discharge Condition: Stable CODE STATUS: Full code Diet recommendation: Low-salt diet  Discharge summary: Rhonda Bailey a 74 y.o.femalewithhistory of hypothyroidism who had recent hemorrhoidectomy on September 16, 2019 following which patient states she has been having persistent diarrhea at least about 10 episodes a day with some blood in it. Also noticed that some discharge from the perianal area. Sometimes bloody. Denied any fever chills. Has been having poor appetite at times and vomiting. Given the symptoms patient presents to the ER admits in [facility].   CT abdomen pelvis done in the ER showed features concerning for colitis with possible sinus/fistulous tract around the perianal area.  Case was discussed with general surgery who recommended no active surgical need.  Admitted with GI consultation.  Colitis with possible perianal fistula: C. difficile negative.  Diarrhea improved.  Patient has prolonged symptoms with perineal fistula and ulceration.  Previous biopsy negative for ulcerative colitis.  Seen and followed by gastroenterology.  With symptomatic improvement, the suggested to discharge patient on prednisone prolonged taper.  No antibiotics.  No evidence of active infection.  Patient is going home with oral steroids, she will follow up with GI office and they will probably repeat her colonoscopy in 1 to 2 months and biopsies.  There was incidental finding of right ovarian vein nonocclusive thrombus, no evidence of septic thrombophlebitis.  Initially treated with heparin drip.  This is nonocclusive thrombus,  no indication for anticoagulation.  Her CT scan of the abdomen pelvis also showed a 2 cm left adrenal lesion, present on previous scans 1 year ago with similar size.  Probably a benign lesion.  May benefit with repeat imaging in 3 to 6 months.  Patient is fairly asymptomatic today.  As per GI recommendation, discharged home.  Patient was taking Xanax and changing her primary care physician, currently no prescriptions, she was given 7 days of Xanax prescriptions.  Discharge Diagnoses:  Principal Problem:   Colitis with fistula Active Problems:   Colitis   Hypokalemia   Microcytic anemia   Diarrhea    Discharge Instructions  Discharge Instructions    Call MD for:  severe uncontrolled pain   Complete by: As directed    Call MD for:  temperature >100.4   Complete by: As directed    Diet general   Complete by: As directed    Increase activity slowly   Complete by: As directed      Allergies as of 10/16/2019      Reactions   Tramadol    Says "blew my head up."      Medication List    TAKE these medications   acidophilus Caps capsule Take 1 capsule by mouth daily.   ALPRAZolam 0.5 MG tablet Commonly known as: XANAX Take 1 tablet (0.5 mg total) by mouth at bedtime as needed for up to 7 days for anxiety or sleep.   HYDROcodone-acetaminophen 5-325 MG tablet Commonly known as: Norco Take 1 tablet by mouth every 6 (six) hours as needed for moderate pain.   hydrocortisone 2.5 % rectal cream Commonly known as: ANUSOL-HC Place 1 application rectally 2 (two) times daily. [place]  ibuprofen 200 MG tablet Commonly known as: ADVIL Take 200-400 mg by mouth every 6 (six) hours as needed for moderate pain.   levothyroxine 25 MCG tablet Commonly known as: SYNTHROID Take 25 mcg by mouth daily before breakfast.   ondansetron 8 MG disintegrating tablet Commonly known as: Zofran ODT Take 1 tablet (8 mg total) by mouth every 8 (eight) hours as needed  for nausea or vomiting.   pantoprazole 40 MG tablet Commonly known as: PROTONIX Take 40 mg by mouth at bedtime.   predniSONE 20 MG tablet Commonly known as: DELTASONE Take 1 tablet (20 mg total) by mouth daily before breakfast for 14 days. Start taking on: October 17, 2019   predniSONE 5 MG tablet Commonly known as: DELTASONE 3 tabs daily for 7 days 2 tabs daily for 7 days 1 tabs daily for 7 days Start taking on: November 01, 2019   traZODone 50 MG tablet Commonly known as: DESYREL Take 50 mg by mouth at bedtime as needed for sleep.      Follow-up Information    Esterwood, Amy S, PA-C Follow up on 10/29/2019.   Specialty: Gastroenterology Why: 1:30pm Contact information: [place] [address] [phone]          Allergies  Allergen Reactions  . Tramadol     Says "blew my head up."    Consultations:  Gastroenterology   Procedures/Studies: CT ABDOMEN PELVIS W CONTRAST  Addendum Date: 10/13/2019   ADDENDUM REPORT: 10/13/2019 13:17 ADDENDUM: Findings of worsening colitis and nonocclusive of right ovarian vein thrombus were related to the provider at the time of dictation of this addendum. These results were called by telephone at the time of interpretation on 10/13/2019 at 1:17 pm to provider [facility] , who verbally acknowledged these results. Electronically Signed   By: Zetta Bills M.D.   On: 10/13/2019 13:17   Result Date: 10/13/2019 CLINICAL DATA:  Anorectal abscess. Decline since recent hemorrhoid operation. EXAM: CT ABDOMEN AND PELVIS WITH CONTRAST TECHNIQUE: Multidetector CT imaging of the abdomen and pelvis was performed using the standard protocol following bolus administration of intravenous contrast. CONTRAST:  1107m OMNIPAQUE IOHEXOL 300 MG/ML  SOLN COMPARISON:  05/08/2019 FINDINGS: Lower chest: Incidental imaging of the lung bases is unremarkable. Hepatobiliary: Liver is normal. Minimal intra and extrahepatic biliary ductal distension likely  post cholecystectomy baseline without change Pancreas: Pancreas is normal without signs of focal lesion, ductal dilation or inflammation. Spleen: Lobular appearance of the spleen. Adrenals/Urinary Tract: Left lateral limb of the adrenal gland with stable approximately 2 cm lesion arising from the left adrenal. Density values indeterminate on previous studies. Mild right adrenal thickening is unchanged. Signs of renal cortical scarring bilaterally and small left renal cysts similar to previous exams. Stomach/Bowel: No signs of acute small bowel process. Stomach is normal. There is mild thickening of the appendix which is unchanged based on comparison with August 22, 2018. Diffuse pericolonic stranding and signs of colonic thickening throughout the colon extending through the rectum. Perianal stranding is noted and there is low attenuation with tract like morphology that extends posteriorly in the gluteal cleft, no sign of large fluid collection. The tract measures approximately 5 mm greatest with. No intraperitoneal fluid collection or signs of free air. Vascular/Lymphatic: Calcified atherosclerosis scattered throughout the abdominal aorta. No signs of aneurysm. No evidence of retroperitoneal adenopathy. No sign of pelvic lymphadenopathy. Reproductive: Uterus with unremarkable CT appearance. The nonocclusive thrombus suspected in right ovarian vein best seen on delayed image 32 of  series 7. Other: No signs of free air or abscess. Musculoskeletal: No sign of acute bone finding or destructive bone process. IMPRESSION: 1. Signs of colitis favoring infectious or inflammatory colitis given distribution. Findings not classic for diverticulitis given the long segment involvement. Correlate with recent antibiotic administration and for evidence of C difficile colitis. 2. No evidence of intraperitoneal fluid collection or signs of free air. 3. Perianal sinus tract or fistulous network is considered. No signs of perianal  abscess. 4. Nonocclusive thrombus suspected in the right ovarian vein. 5. Stable appearance of left adrenal lesion. This remains indeterminate. Consider follow-up CT adrenal protocol. 6. Stable mild thickening of the appendix. A call is out to the referring provider to further discuss findings in the above case. Aortic Atherosclerosis (ICD10-I70.0). Electronically Signed: By: Zetta Bills M.D. On: 10/13/2019 13:08   DG CHEST PORT 1 VIEW  Result Date: 10/14/2019 CLINICAL DATA:  Follow-up COVID-19 virus EXAM: PORTABLE CHEST 1 VIEW COMPARISON:  None. FINDINGS: The heart size and mediastinal contours are within normal limits. Both lungs are clear. The visualized skeletal structures are unremarkable. IMPRESSION: No active disease. Electronically Signed   By: Suzy Bouchard M.D.   On: 10/14/2019 09:13     Subjective: Patient seen and examined.  No overnight events.  Denies any nausea vomiting.  Eating well.  She had 2-3 episodes of small volume, soft mushy stool with no bleeding.  Afebrile.   Discharge Exam: Vitals:   10/15/19 2208 10/16/19 0554  BP: (!) 158/83 (!) 121/54  Pulse: 93 79  Resp: 20 18  Temp: 98 F (36.7 C) 98 F (36.7 C)  SpO2: 96% 91%   Vitals:   10/15/19 0413 10/15/19 1329 10/15/19 2208 10/16/19 0554  BP: (!) 120/51 (!) 142/60 (!) 158/83 (!) 121/54  Pulse: 79 89 93 79  Resp: 16 17 20 18   Temp: (!) 97.5 F (36.4 C) 98.3 F (36.8 C) 98 F (36.7 C) 98 F (36.7 C)  TempSrc: Oral Oral  Oral  SpO2: 95% 96% 96% 91%  Weight:      Height:        General: Pt is alert, awake, not in acute distress Cardiovascular: RRR, S1/S2 +, no rubs, no gallops Respiratory: CTA bilaterally, no wheezing, no rhonchi Abdominal: Soft, NT, ND, bowel sounds + Extremities: no edema, no cyanosis    The results of significant diagnostics from this hospitalization (including imaging, microbiology, ancillary and laboratory) are listed below for reference.     Microbiology: Recent Results  (from the past 240 hour(s))  Blood culture (routine x 2)     Status: None (Preliminary result)   Collection Time: 10/13/19  2:45 PM   Specimen: BLOOD  Result Value Ref Range Status   Specimen Description   Final    BLOOD RIGHT ANTECUBITAL Performed at [facility] [address] [place], [place] [address]    Special Requests   Final    BOTTLES DRAWN AEROBIC AND ANAEROBIC Blood Culture adequate volume Performed at [facility], [address] [place], [place] [address]    Culture   Final    NO GROWTH 3 DAYS Performed at [facility], [place] [address] [place], [place] [address]    Report Status PENDING  Incomplete  Blood culture (routine x 2)     Status: None (Preliminary result)   Collection Time: 10/13/19  2:50 PM   Specimen: BLOOD LEFT HAND  Result Value Ref Range Status   Specimen Description  Final    BLOOD LEFT HAND Performed at [facility], [place] [address] [place], [place] [address]    Special Requests   Final    BOTTLES DRAWN AEROBIC AND ANAEROBIC Blood Culture results may not be optimal due to an inadequate volume of blood received in culture bottles Performed at [facility], [address] [place], [place] [address]    Culture   Final    NO GROWTH 3 DAYS Performed at [facility], [place] [address], [place], [place] [address]    Report Status PENDING  Incomplete  SARS CORONAVIRUS 2 (TAT 6-24 HRS) Nasopharyngeal Nasopharyngeal Swab     Status: Abnormal   Collection Time: 10/13/19  2:50 PM   Specimen: Nasopharyngeal Swab  Result Value Ref Range Status   SARS Coronavirus 2 POSITIVE (A) NEGATIVE Final    Comment: EMAILED LORI B. AT 0545 ON 10/14/2019 BY MESSAN H. (NOTE) SARS-CoV-2 target nucleic acids are DETECTED. The SARS-CoV-2 RNA is generally detectable in upper and lower respiratory specimens during the acute phase of infection. Positive results are indicative of the presence of SARS-CoV-2 RNA.  Clinical correlation with patient history and other diagnostic information is  necessary to determine patient infection status. Positive results do not rule out bacterial infection or co-infection with other viruses.  The expected result is Negative. Fact Sheet for Patients: SugarRoll.be Fact Sheet for Healthcare Providers: https://www.woods-mathews.com/ This test is not yet approved or cleared by the [place] FDA and  has been authorized for detection and/or diagnosis of SARS-CoV-2 by FDA under an Emergency Use Authorization (EUA). This EUA will remain  in effect (meaning this test can be used) for the duration of the COVID-19 declaration  under Section 564(b)(1) of the Act, 21 U.S.C. section 360bbb-3(b)(1), unless the authorization is terminated or revoked sooner. Performed at [facility], [place] [address] [place], [place] [address]   C difficile quick scan w PCR reflex     Status: None   Collection Time: 10/15/19  2:50 AM   Specimen: STOOL  Result Value Ref Range Status   C Diff antigen NEGATIVE NEGATIVE Final   C Diff toxin NEGATIVE NEGATIVE Final   C Diff interpretation No C. difficile detected.  Final    Comment: Performed at [facility], [place] [address] [place],  [address]     Labs: BNP (last 3 results) No results for input(s): BNP in the last 8760 hours. Basic Metabolic Panel: Recent Labs  Lab 10/13/19 1131 10/14/19 0506 10/15/19 0447 10/16/19 0417  NA 139 143 141 143  K 3.0* 3.7 3.2* 3.8  CL 104 107 108 110  CO2 26 29 28 25   GLUCOSE 107* 116* 140* 117*  BUN 9 9 8 9   CREATININE 0.96 1.05* 0.79 0.80  CALCIUM 8.3* 8.3* 8.1* 8.6*  MG  --   --   --  2.4  PHOS  --   --   --  3.6   Liver Function Tests: Recent Labs  Lab 10/13/19 1131  AST 12*  ALT 9  ALKPHOS 74  BILITOT 0.6  PROT 6.5  ALBUMIN 2.6*   Recent Labs  Lab 10/13/19 1131  LIPASE 20   No results for input(s):  AMMONIA in the last 168 hours. CBC: Recent Labs  Lab 10/13/19 1131 10/14/19 0506 10/15/19 0447  WBC 15.5* 13.2* 15.0*  NEUTROABS 11.9*  --   --   HGB 9.1* 8.5* 8.5*  HCT 30.1* 28.8* 29.4*  MCV 72.9* 73.5* 74.4*  PLT 502* 451* 456*   Cardiac Enzymes: No results for input(s): CKTOTAL, CKMB, CKMBINDEX, TROPONINI in the last 168 hours. BNP: Invalid input(s): POCBNP CBG: No results for input(s): GLUCAP in the last 168 hours. D-Dimer No results for input(s): DDIMER in the last 72 hours. Hgb A1c No results for input(s): HGBA1C in the last 72 hours. Lipid Profile No results for input(s): CHOL, HDL, LDLCALC, TRIG, CHOLHDL, LDLDIRECT in the last 72 hours. Thyroid function studies No results for input(s): TSH, T4TOTAL, T3FREE, THYROIDAB in the last 72 hours.  Invalid input(s): FREET3 Anemia work up Recent Labs    10/14/19 1642 10/14/19 1651  VITAMINB12 914  --   FOLATE 18.8  --   FERRITIN 67  --   TIBC 188*  --   IRON 12*  --   RETICCTPCT  --  1.3   Urinalysis    Component Value Date/Time   COLORURINE STRAW (A) 10/13/2019 1135   APPEARANCEUR CLEAR 10/13/2019 1135   LABSPEC <1.005 (L) 10/13/2019 1135   PHURINE 7.0 10/13/2019 [place] 10/13/2019 1135   HGBUR NEGATIVE 10/13/2019 [place] 10/13/2019 1135   KETONESUR NEGATIVE 10/13/2019 1135   PROTEINUR NEGATIVE 10/13/2019 1135   NITRITE NEGATIVE 10/13/2019 1135   LEUKOCYTESUR NEGATIVE 10/13/2019 1135   Sepsis Labs Invalid input(s): PROCALCITONIN,  WBC,  LACTICIDVEN Microbiology Recent Results (from the past 240 hour(s))  Blood culture (routine x 2)     Status: None (Preliminary result)   Collection Time: 10/13/19  2:45 PM   Specimen: BLOOD  Result Value Ref Range Status   Specimen Description   Final    BLOOD RIGHT ANTECUBITAL Performed at [facility], [address] [place], [place] [address]    Special Requests   Final    BOTTLES DRAWN AEROBIC AND ANAEROBIC Blood  Culture adequate volume Performed at [facility], [address] [place], [place] [address]    Culture   Final    NO GROWTH 3 DAYS Performed at [facility], [place] [address], [place], [place] [address]    Report Status PENDING  Incomplete  Blood culture (routine x 2)     Status: None (Preliminary result)   Collection Time: 10/13/19  2:50 PM   Specimen: BLOOD LEFT HAND  Result Value Ref Range Status   Specimen Description   Final    BLOOD LEFT HAND Performed at [facility], [place] [address] [place], [place] [address]    Special Requests   Final    BOTTLES DRAWN AEROBIC AND ANAEROBIC Blood Culture results may not be optimal due to an inadequate volume of blood received in culture bottles Performed at [facility], [address] [place], [place] [address]    Culture   Final    NO GROWTH 3 DAYS Performed at [facility], [place] [address], [place], [place] [address]    Report Status PENDING  Incomplete  SARS CORONAVIRUS 2 (TAT 6-24 HRS) Nasopharyngeal Nasopharyngeal Swab     Status: Abnormal   Collection Time: 10/13/19  2:50 PM   Specimen: Nasopharyngeal Swab  Result Value Ref Range Status   SARS Coronavirus 2 POSITIVE (A) NEGATIVE Final    Comment: EMAILED LORI B. AT 0545 ON 10/14/2019 BY MESSAN H. (NOTE) SARS-CoV-2 target nucleic acids are DETECTED. The SARS-CoV-2 RNA is generally detectable in upper and lower respiratory specimens during the acute phase of infection.  Positive results are indicative of the presence of SARS-CoV-2 RNA. Clinical correlation with patient history and other diagnostic information is  necessary to determine patient infection status. Positive results do not rule out bacterial infection or co-infection with other viruses.  The expected result is Negative. Fact Sheet for Patients: SugarRoll.be Fact Sheet for Healthcare Providers: https://www.woods-mathews.com/ This test is  not yet approved or cleared by the [place] FDA and  has been authorized for detection and/or diagnosis of SARS-CoV-2 by FDA under an Emergency Use Authorization (EUA). This EUA will remain  in effect (meaning this test can be used) for the duration of the COVID-19 declaration  under Section 564(b)(1) of the Act, 21 U.S.C. section 360bbb-3(b)(1), unless the authorization is terminated or revoked sooner. Performed at [facility], [place] [address], [place], [place] [address]   C difficile quick scan w PCR reflex     Status: None   Collection Time: 10/15/19  2:50 AM   Specimen: STOOL  Result Value Ref Range Status   C Diff antigen NEGATIVE NEGATIVE Final   C Diff toxin NEGATIVE NEGATIVE Final   C Diff interpretation No C. difficile detected.  Final    Comment: Performed at [facility], [place] [address] [place], [place] [address]     Time coordinating discharge: 32 minutes  SIGNED:   Barb Merino, MD  [facility] 10/16/2019, 9:11 AM

## 2019-10-18 LAB — CULTURE, BLOOD (ROUTINE X 2)
Culture: NO GROWTH
Culture: NO GROWTH
Special Requests: ADEQUATE

## 2019-10-29 ENCOUNTER — Encounter: Payer: Self-pay | Admitting: Physician Assistant

## 2019-10-29 ENCOUNTER — Telehealth: Payer: Self-pay | Admitting: Physician Assistant

## 2019-10-29 ENCOUNTER — Ambulatory Visit: Payer: Medicare HMO | Admitting: Physician Assistant

## 2019-10-29 ENCOUNTER — Other Ambulatory Visit: Payer: Self-pay

## 2019-10-29 VITALS — BP 140/74 | HR 80 | Temp 97.9°F | Ht 65.25 in | Wt 161.4 lb

## 2019-10-29 DIAGNOSIS — Z9889 Other specified postprocedural states: Secondary | ICD-10-CM | POA: Diagnosis not present

## 2019-10-29 DIAGNOSIS — R151 Fecal smearing: Secondary | ICD-10-CM | POA: Diagnosis not present

## 2019-10-29 DIAGNOSIS — K529 Noninfective gastroenteritis and colitis, unspecified: Secondary | ICD-10-CM | POA: Diagnosis not present

## 2019-10-29 DIAGNOSIS — Z8719 Personal history of other diseases of the digestive system: Secondary | ICD-10-CM | POA: Diagnosis not present

## 2019-10-29 MED ORDER — HYDROCORTISONE ACETATE 30 MG RE SUPP: 1.0000 | Freq: Two times a day (BID) | RECTAL | 3 refills | Status: DC

## 2019-10-29 MED ORDER — PREDNISONE 10 MG PO TABS: 10.0000 mg | ORAL_TABLET | Freq: Every day | ORAL | 0 refills | Status: DC

## 2019-10-29 NOTE — Patient Instructions (Signed)
We have sent the following medications to your pharmacy for you to pick up at your convenience:  Prednisone, Hydrocortisone suppositories  Continue Protonix 15m daily  You have been scheduled for a colonoscopy. Please follow written instructions given to you at your visit today.  Please pick up your prep supplies at the pharmacy within the next 1-3 days. If you use inhalers (even only as needed), please bring them with you on the day of your procedure.

## 2019-10-30 ENCOUNTER — Encounter: Payer: Self-pay | Admitting: Physician Assistant

## 2019-10-30 NOTE — Progress Notes (Signed)
Subjective:    Patient ID: Rhonda Bailey, female    DOB: 08-Jun-1946, 74 y.o.   MRN: [id number]  HPI Rhonda Bailey is a pleasant 74 year old white female, recently established with Dr. Silverio Decamp  during hospitalization 3/8 through 10/16/2019.  She had previously been seen by Dr. Gala Romney, and had undergone an internal and external hemorrhoidectomy per Dr. Danie Binder in February 2021.  She was admitted with progressive fatigue weakness diarrhea rectal bleeding and rectal drainage. She had also been ill with COVID-19 in January 2021. She had undergone colonoscopy per Dr. Buford Dresser in January 2020 that showed multiple diverticuli and marked inflammatory changes in the sigmoid and left colon.  Biopsies were done and showed a severely active colitis with no definite chronic features.  Biopsies of the rectum showed granulation tissue consistent with an ulcer.  She did not get any specific treatment after that colonoscopy, but at some point later in 2020 she did have a short course of Entocort. Patient says she started developing significant problems with diarrhea in November 2020 associated with rectal burning discomfort and intermittent hematochezia.  She was eventually seen by [facility] in December 2020 and noted on exam to have excoriated external hemorrhoids and was exquisitely tender with inability to perform digital exam or anoscopy.  Subsequently referred to Dr. Arnoldo Morale for hemorrhoidectomy. At the time of hemorrhoidectomy she was noted to have a very narrowed scarred anal canal and a large ulcerative posterior lesion with scarring encompassing 40 to 50% of the posterior canal and some evidence of rectal mucosal prolapse.  Biopsies were done of the anal ulcer due to concern for malignancy and this showed inflamed granulation tissue no malignancy. She has been struggling now over the past several weeks with more frequent bowel movements or appetite and has had a 30 pound weight loss over the past 3 to 4 months.   She had been having very mucoid appearing stools and rectal pain and burning.  She was noticing some oozing of blood from the rectum as well. CT imaging on admission showed diffuse pericolonic stranding and chronic thickening throughout the colon and rectum, there was tract-like morphology external posterior into the gluteal cleft measuring 5 mm.  Also noted to have a nonocclusive right ovarian vein thrombus. She was started on IV Zosyn, stool studies were ordered.  GI path panel was never completed C. difficile was negative. She responded very quickly to IV antibiotics and had resolution of loose stools and drainage.  She had never had any abdominal pain. Decision was made to discharge her home on a course of prednisone starting 40 mg daily and tapering. Today patient says she is on her last day of 40 mg of prednisone.  She has not been having any diarrhea and in fact has not been having a bowel movement every day.  She denies any rectal pain or abdominal pain and says she has been eating well.  She continues to have some intermittent rectal burning and stool seepage from the rectum enough to cause her to need to wear a pad.  She says the rectal burning generally bothers her early in the morning hours prior to bowel movements.  Review of Systems Pertinent positive and negative review of systems were noted in the above HPI section.  All other review of systems was otherwise negative.  Outpatient Encounter Medications as of 10/29/2019  Medication Sig  . acidophilus (RISAQUAD) CAPS capsule Take 1 capsule by mouth daily.  Marland Kitchen ALPRAZolam (XANAX) 0.5 MG tablet Take 0.5 mg  by mouth at bedtime as needed for sleep.  Marland Kitchen levothyroxine (SYNTHROID, LEVOTHROID) 25 MCG tablet Take 25 mcg by mouth daily before breakfast.   . ondansetron (ZOFRAN ODT) 8 MG disintegrating tablet Take 1 tablet (8 mg total) by mouth every 8 (eight) hours as needed for nausea or vomiting.  . pantoprazole (PROTONIX) 40 MG tablet Take 40 mg by  mouth at bedtime.   Derrill Memo ON 11/01/2019] predniSONE (DELTASONE) 5 MG tablet 3 tabs daily for 7 days 2 tabs daily for 7 days 1 tabs daily for 7 days  . traZODone (DESYREL) 50 MG tablet Take 50 mg by mouth at bedtime as needed for sleep.   Marland Kitchen HYDROCORTISONE ACE, RECTAL, 30 MG SUPP Place 1 suppository (30 mg total) rectally in the morning and at bedtime.  . predniSONE (DELTASONE) 10 MG tablet Take 1 tablet (10 mg total) by mouth daily with breakfast. Take 62m every morning for 2 weeks; then 214mevery morning for 2 weeks, then 1550mvery morning for 2 weeks; then 44m59mery morning for 2 weeks; then stop  . [DISCONTINUED] HYDROcodone-acetaminophen (NORCO) 5-325 MG tablet Take 1 tablet by mouth every 6 (six) hours as needed for moderate pain.  . [DISCONTINUED] hydrocortisone (ANUSOL-HC) 2.5 % rectal cream Place 1 application rectally 2 (two) times daily. [facility] HEMORRHOID CREAM  . [DISCONTINUED] ibuprofen (ADVIL) 200 MG tablet Take 200-400 mg by mouth every 6 (six) hours as needed for moderate pain.  . [DISCONTINUED] predniSONE (DELTASONE) 20 MG tablet Take 1 tablet (20 mg total) by mouth daily before breakfast for 14 days.   No facility-administered encounter medications on file as of 10/29/2019.   Allergies  Allergen Reactions  . Tramadol     Says "blew my head up."   Patient Active Problem List   Diagnosis Date Noted  . Diarrhea   . Colitis with fistula 10/13/2019  . Rectal ulceration   . Hemorrhoids 07/29/2019  . Anxiety disorder, unspecified   . Gastro-esophageal reflux disease with esophagitis   . Essential (primary) hypertension   . Noninfective gastroenteritis and colitis, unspecified   . Pyelonephritis   . Sepsis with acute organ dysfunction (HCC)[place]/08/2018  . Nephrolithiasis 05/08/2019  . Rigors 05/08/2019  . Hypothyroidism   . UTI (urinary tract infection)   . Gross hematuria   . Renal colic on right side   . Ureteral stone with hydronephrosis   . Dehydration    . AKI (acute kidney injury) (HCC)[place]. Thrombocytosis (HCC)[place]. Dysphagia 01/21/2019  . Microcytic anemia 01/21/2019  . Anemia 08/29/2018  . Insomnia 08/29/2018  . Abnormal CT of the abdomen 08/29/2018  . Diarrhea of infectious origin   . Colitis 08/28/2018  . Adrenal nodule (HCC)[place]/22/2020  . Hypokalemia 08/28/2018  . Dehydration with hyponatremia 08/28/2018  . Leukocytosis 08/28/2018   Social History   Socioeconomic History  . Marital status: Divorced    Spouse name: Not on file  . Number of children: 2  . Years of education: Not on file  . Highest education level: Not on file  Occupational History  . Occupation: retired  Tobacco Use  . Smoking status: Never Smoker  . Smokeless tobacco: Never Used  Substance and Sexual Activity  . Alcohol use: Yes    Comment: once a year  . Drug use: No  . Sexual activity: Not Currently  Other Topics Concern  . Not on file  Social History Narrative  . Not on file   Social Determinants of  Health   Financial Resource Strain: Low Risk   . Difficulty of Paying Living Expenses: Not hard at all  Food Insecurity: No Food Insecurity  . Worried About Charity fundraiser in the Last Year: Never true  . Ran Out of Food in the Last Year: Never true  Transportation Needs: No Transportation Needs  . Lack of Transportation (Medical): No  . Lack of Transportation (Non-Medical): No  Physical Activity: Sufficiently Active  . Days of Exercise per Week: 5 days  . Minutes of Exercise per Session: 30 min  Stress: No Stress Concern Present  . Feeling of Stress : Not at all  Social Connections: Moderately Isolated  . Frequency of Communication with Friends and Family: More than three times a week  . Frequency of Social Gatherings with Friends and Family: More than three times a week  . Attends Religious Services: Never  . Active Member of Clubs or Organizations: No  . Attends Archivist Meetings: Never  . Marital Status: Divorced    Human resources officer Violence: Not At Risk  . Fear of Current or Ex-Partner: No  . Emotionally Abused: No  . Physically Abused: No  . Sexually Abused: No    Ms. Scheibel's family history includes Cancer in her brother, daughter, and father; Heart disease in her brother and sister.      Objective:    Vitals:   10/29/19 1316  BP: 140/74  Pulse: 80  Temp: 97.9 F (36.6 C)    Physical Exam Well-developed well-nourished elderly  female in no acute distress.  Height, Weight,161 BMI 26.6 Accompanied by her sister  HEENT; nontraumatic normocephalic, EOMI, PER R LA, sclera anicteric. Oropharynx; not examined Neck; supple, no JVD Cardiovascular; regular rate and rhythm with S1-S2, no murmur rub or gallop Pulmonary; Clear bilaterally Abdomen; soft, nontender, nondistended, no palpable mass or hepatosplenomegaly, bowel sounds are active Rectal; small external hemorrhoidal tags, no obvious fistula or drainage, on digital exam the anus is very irregular and scarred and she is quite tender to exam Skin; benign exam, no jaundice rash or appreciable lesions Extremities; no clubbing cyanosis or edema skin warm and dry Neuro/Psych; alert and oriented x4, grossly nonfocal mood and affect appropriate       Assessment & Plan:   #89 74 year old white female with presumed IBD, suspect Crohn's due to ongoing rectal symptoms and nonspecific rectal ulcer noted at the time of recent internal and external hemorrhoidectomy. Patient improving on steroids with no current complaint of diarrhea or abdominal pain.  Rectal pain has significantly improved though she continues to have some intermittent burning and seepage of stool. Digital exam abnormal very scarred and irregular anus  #2 status post internal and external hemorrhoidectomy February 2021 #3 GERD 4.  Hypothyroidism 5.  Hypertension  Plan; decrease prednisone to 30 mg p.o. daily x2 weeks, then 20 mg daily x2 weeks, then 15 mg daily x2 weeks, then 10  mg daily x2 weeks.  Will schedule patient for colonoscopy with Dr. Sunday Spillers.  Procedure was discussed in detail with the patient including indications risks and benefits and she is agreeable to proceed. She has had her first Covid vaccine and is scheduled for the second vaccine the first week of April. She will continue sitz bath's as needed and will give her a trial of hydrocortisone suppositories twice daily for rectal burning, until colonoscopy. Takai Chiaramonte S Zarie Kosiba PA-C 10/30/2019   Cc: [facility]

## 2019-10-31 NOTE — Telephone Encounter (Signed)
Pt states she is feeling hot and she thinks it is due to medication prednisone. Pt requesting to speak with Amy.

## 2019-10-31 NOTE — Progress Notes (Signed)
Reviewed and agree with documentation and assessment and plan. K. Veena Altariq Goodall , MD   

## 2019-11-04 ENCOUNTER — Other Ambulatory Visit: Payer: Self-pay | Admitting: General Surgery

## 2019-11-04 DIAGNOSIS — G4700 Insomnia, unspecified: Secondary | ICD-10-CM | POA: Diagnosis not present

## 2019-11-04 DIAGNOSIS — E039 Hypothyroidism, unspecified: Secondary | ICD-10-CM | POA: Diagnosis not present

## 2019-11-04 DIAGNOSIS — K219 Gastro-esophageal reflux disease without esophagitis: Secondary | ICD-10-CM | POA: Diagnosis not present

## 2019-11-04 DIAGNOSIS — E7800 Pure hypercholesterolemia, unspecified: Secondary | ICD-10-CM | POA: Diagnosis not present

## 2019-11-06 HISTORY — PX: COLONOSCOPY: SHX174

## 2019-11-06 NOTE — Telephone Encounter (Signed)
Pt stated that taking prednisone makes her feel hot.  She inquired whether the dose might be too strong for her.

## 2019-11-06 NOTE — Telephone Encounter (Signed)
Spoke with the patient.  She was concerned that the oral prednisone in addition to the suppositories was "too much " steriods. She does admit she is improving in terms of her bowel symptoms. She complains of feeling hot and not sleeping well. She is taking the prednisone as ordered every morning. She is willing to continue this and understands the side effects should improve as she continues the taper. She will call back with any other issues or failure to improve.

## 2019-11-10 ENCOUNTER — Telehealth: Payer: Self-pay

## 2019-11-10 NOTE — Telephone Encounter (Signed)
Spoke with the patient.  We were able to schedule her at the same procedure time for 12/04/19 with  Dr [name]. She has changed the date on her instructions.

## 2019-11-10 NOTE — Telephone Encounter (Signed)
-----   Message from Mauri Pole, MD sent at 11/10/2019 12:23 PM EDT ----- Regarding: RE: reschedule  Colonoscopy Its fine with me. Thanks ----- Message ----- From: Alfredia Ferguson, PA-C Sent: 11/03/2019   4:28 PM EDT To: Greggory Keen, LPN, Mauri Pole, MD, # Subject: reschedule  Colonoscopy                         Beth, I saw this patient in the office last week after she had been hospitalized.  She has inflammatory bowel disease, probably Crohn's and is currently on steroids.  She was seen by Dr. Silverio Decamp in the hospital and I set up colonoscopy in [place] with Dr. Silverio Decamp . Patient and patient sister who were here that day were both fine with that plan. Patient's daughter has called a couple of times very upset that mom is not scheduled to have the colonoscopy with Dr. [name].  (They had been scheduled for an outpatient appointment with him prior to her being hospitalized) I talked to the daughter today extensively, and she is still adamant that mom have colonoscopy and then follow-up with Dr. [name]  Please get her scheduled for colonoscopy with Dr. [name], probably will have to redo the prep instructions etc.  Mom is a bit hard of hearing, and she will be the one answering the phone tomorrow.. Thank you

## 2019-11-19 ENCOUNTER — Other Ambulatory Visit: Payer: Self-pay | Admitting: Physician Assistant

## 2019-11-27 ENCOUNTER — Ambulatory Visit: Payer: Medicare HMO | Admitting: Gastroenterology

## 2019-11-27 ENCOUNTER — Encounter: Payer: Medicare HMO | Admitting: Gastroenterology

## 2019-12-04 ENCOUNTER — Other Ambulatory Visit: Payer: Self-pay

## 2019-12-04 ENCOUNTER — Encounter: Payer: Self-pay | Admitting: Gastroenterology

## 2019-12-04 ENCOUNTER — Other Ambulatory Visit (INDEPENDENT_AMBULATORY_CARE_PROVIDER_SITE_OTHER): Payer: Medicare HMO

## 2019-12-04 ENCOUNTER — Ambulatory Visit (AMBULATORY_SURGERY_CENTER): Payer: Medicare HMO | Admitting: Gastroenterology

## 2019-12-04 VITALS — BP 145/63 | HR 93 | Temp 98.0°F | Resp 19 | Ht 65.0 in | Wt 161.0 lb

## 2019-12-04 DIAGNOSIS — K648 Other hemorrhoids: Secondary | ICD-10-CM

## 2019-12-04 DIAGNOSIS — K635 Polyp of colon: Secondary | ICD-10-CM

## 2019-12-04 DIAGNOSIS — K529 Noninfective gastroenteritis and colitis, unspecified: Secondary | ICD-10-CM | POA: Diagnosis not present

## 2019-12-04 DIAGNOSIS — K51911 Ulcerative colitis, unspecified with rectal bleeding: Secondary | ICD-10-CM | POA: Diagnosis not present

## 2019-12-04 DIAGNOSIS — K611 Rectal abscess: Secondary | ICD-10-CM | POA: Diagnosis not present

## 2019-12-04 DIAGNOSIS — D122 Benign neoplasm of ascending colon: Secondary | ICD-10-CM

## 2019-12-04 DIAGNOSIS — K5190 Ulcerative colitis, unspecified, without complications: Secondary | ICD-10-CM | POA: Diagnosis not present

## 2019-12-04 DIAGNOSIS — K626 Ulcer of anus and rectum: Secondary | ICD-10-CM | POA: Diagnosis not present

## 2019-12-04 LAB — VITAMIN D 25 HYDROXY (VIT D DEFICIENCY, FRACTURES): VITD: 32.53 ng/mL (ref 30.00–100.00)

## 2019-12-04 LAB — SEDIMENTATION RATE: Sed Rate: 44 mm/hr — ABNORMAL HIGH (ref 0–30)

## 2019-12-04 LAB — HIGH SENSITIVITY CRP: CRP, High Sensitivity: 92.11 mg/L — ABNORMAL HIGH (ref 0.000–5.000)

## 2019-12-04 MED ORDER — PREDNISONE 10 MG PO TABS: ORAL_TABLET | ORAL | 0 refills | Status: AC

## 2019-12-04 MED ORDER — SODIUM CHLORIDE 0.9 % IV SOLN: 500.0000 mL | INTRAVENOUS | Status: DC

## 2019-12-04 NOTE — Progress Notes (Signed)
A/ox3, pleased with MAC, report to RN 

## 2019-12-04 NOTE — Patient Instructions (Signed)
YOU HAD AN ENDOSCOPIC PROCEDURE TODAY AT [facility]:   Refer to the procedure report that was given to you for any specific questions about what was found during the examination.  If the procedure report does not answer your questions, please call your gastroenterologist to clarify.  If you requested that your care partner not be given the details of your procedure findings, then the procedure report has been included in a sealed envelope for you to review at your convenience later.  **Handouts given on polyps and diverticulosis**  YOU SHOULD EXPECT: Some feelings of bloating in the abdomen. Passage of more gas than usual.  Walking can help get rid of the air that was put into your GI tract during the procedure and reduce the bloating. If you had a lower endoscopy (such as a colonoscopy or flexible sigmoidoscopy) you may notice spotting of blood in your stool or on the toilet paper. If you underwent a bowel prep for your procedure, you may not have a normal bowel movement for a few days.  Please Note:  You might notice some irritation and congestion in your nose or some drainage.  This is from the oxygen used during your procedure.  There is no need for concern and it should clear up in a day or so.  SYMPTOMS TO REPORT IMMEDIATELY:   Following lower endoscopy (colonoscopy or flexible sigmoidoscopy):  Excessive amounts of blood in the stool  Significant tenderness or worsening of abdominal pains  Swelling of the abdomen that is new, acute  Fever of 100F or higher  For urgent or emergent issues, a gastroenterologist can be reached at any hour by calling [phone]. Do not use MyChart messaging for urgent concerns.    DIET:  We do recommend a small meal at first, but then you may proceed to your regular diet.  Drink plenty of fluids but you should avoid alcoholic beverages for 24 hours.  ACTIVITY:  You should plan to take it easy for the rest of today and you should NOT  DRIVE or use heavy machinery until tomorrow (because of the sedation medicines used during the test).    FOLLOW UP: Our staff will call the number listed on your records 48-72 hours following your procedure to check on you and address any questions or concerns that you may have regarding the information given to you following your procedure. If we do not reach you, we will leave a message.  We will attempt to reach you two times.  During this call, we will ask if you have developed any symptoms of COVID 19. If you develop any symptoms (ie: fever, flu-like symptoms, shortness of breath, cough etc.) before then, please call [phone].  If you test positive for Covid 19 in the 2 weeks post procedure, please call and report this information to [place].    If any biopsies were taken you will be contacted by phone or by letter within the next 1-3 weeks.  Please call us at [phone] if you have not heard about the biopsies in 3 weeks.    SIGNATURES/CONFIDENTIALITY: You and/or your care partner have signed paperwork which will be entered into your electronic medical record.  These signatures attest to the fact that that the information above on your After Visit Summary has been reviewed and is understood.  Full responsibility of the confidentiality of this discharge information lies with you and/or your care-partner.

## 2019-12-04 NOTE — Progress Notes (Signed)
Called to room to assist during endoscopic procedure.  Patient ID and intended procedure confirmed with present staff. Received instructions for my participation in the procedure from the performing physician.  

## 2019-12-04 NOTE — Op Note (Signed)
[facility] Patient Name: Rhonda Bailey Procedure Date: 12/04/2019 8:09 AM MRN: [id number] Endoscopist: Justice Britain , MD Age: 74 Referring MD:  Date of Birth: [birth year] Gender: Female Account #: [id number] Procedure:                Colonoscopy Indications:              Chronic diarrhea, Abnormal CT of the GI tract,                            Exclusion of ulcerative colitis Medicines:                Monitored Anesthesia Care Procedure:                Pre-Anesthesia Assessment:                           - Prior to the procedure, a History and Physical                            was performed, and patient medications and                            allergies were reviewed. The patient's tolerance of                            previous anesthesia was also reviewed. The risks                            and benefits of the procedure and the sedation                            options and risks were discussed with the patient.                            All questions were answered, and informed consent                            was obtained. Prior Anticoagulants: The patient has                            taken no previous anticoagulant or antiplatelet                            agents. ASA Grade Assessment: III - [name] with                            severe systemic disease. After reviewing the risks                            and benefits, the patient was deemed in                            satisfactory condition to undergo the procedure.  After obtaining informed consent, the colonoscope                            was passed under direct vision. Throughout the                            procedure, the patient's blood pressure, pulse, and                            oxygen saturations were monitored continuously. The                            Colonoscope was introduced through the anus and                            advanced to the 4 cm into  the ileum. The                            colonoscopy was performed without difficulty. The                            patient tolerated the procedure. The quality of the                            bowel preparation was adequate. The terminal ileum,                            ileocecal valve, appendiceal orifice, and rectum                            were photographed. Scope In: 8:25:04 AM Scope Out: 8:59:34 AM Scope Withdrawal Time: 0 hours 30 minutes 28 seconds  Total Procedure Duration: 0 hours 34 minutes 30 seconds  Findings:                 The digital rectal exam findings include rectal                            tenderness, hemorrhoids and rectal ulceration.                           The terminal ileum and ileocecal valve appeared                            normal. Biopsies were taken with a cold forceps for                            histology to rule out Crohn's Disease.                           A 8 mm polyp was found in the proximal ascending                            colon. The polyp was sessile. The polyp was removed  with a cold snare. Resection and retrieval were                            complete.                           Inflammation characterized by altered vascularity,                            erythema, friability and granularity was found as                            patches surrounded by normal mucosa in the                            transverse colon to the cecum. This was graded as                            Mayo Score 1 - Mayo Score 2 (mild - to moderate,                            with erythema, decreased vascular pattern,                            friability, erosions). Biopsies were taken with a                            cold forceps for histology to rule out chronic                            colitis.                           Inflammation characterized by altered vascularity,                            congestion (edema),  erosions, erythema, friability,                            granularity and shallow ulcerations was found in a                            continuous and circumferential pattern from the                            rectum to the splenic flexure. This was graded as                            Mayo Score 3 (severe, with spontaneous bleeding,                            ulcerations). Biopsies were taken with a cold  forceps for histology.                           A single (solitary) twenty mm ulcer was found in                            the distal rectum. No bleeding was present. No                            stigmata of recent bleeding were seen. Biopsies                            were taken with a cold forceps for histology.                           Many small and large-mouthed diverticula were found                            in the entire colon.                           Non-bleeding non-thrombosed external and internal                            hemorrhoids were found during retroflexion, during                            perianal exam and during digital exam. The                            hemorrhoids were Grade I (internal hemorrhoids that                            do not prolapse). Complications:            No immediate complications. Estimated Blood Loss:     Estimated blood loss was minimal. Impression:               - Rectal tenderness, hemorrhoids and rectal                            stricture found on digital rectal exam.                           - The examined portion of the ileum was normal.                            Biopsied.                           - 8 mm polyp was found in the proximal ascending                            colon. The polyp was sessile. The polyp was removed  with a cold snare. Resection and retrieval were                            complete.                           - Colitis. Inflammation was found  in the transverse                            colon, in the hepatic flexure, in the ascending                            colon and at the cecum. This was graded as Mayo                            Score 1 (mild disease) and graded as Mayo Score 2                            (moderate disease). Biopsied.                           - Colitis. Inflammation was found from the rectum                            to the splenic flexure. This was graded as Mayo                            Score 3 (severe disease). Biopsied.                           - A single (solitary) ulcer in the distal rectum.                            Biopsied.                           - Diverticulosis in the entire examined colon.                           - Non-bleeding non-thrombosed external and internal                            hemorrhoids. Recommendation:           - The patient will be observed post-procedure,                            until all discharge criteria are met.                           - Discharge patient to home.                           - Patient has a contact number available for  emergencies. The signs and symptoms of potential                            delayed complications were discussed with the                            patient. Return to normal activities tomorrow.                            Written discharge instructions were provided to the                            patient.                           - Increase Prednisone back to 40 mg daily for at                            least the next 2-weeks and then decrease to 30 mg                            for 2-weeks. Further tapering will be considered                            based on next steps in therapeutic management that                            we consider (Biologic therapy vs Immunomodulator                            therapy). We will need to discuss in clinic further.                           - We need  to obtain some labwork today: ESR/CRP,                            HAV IgG, HBsAb, HBsAg, HBcAb IgG, HCV Ab, VZV IgG,                            Quantiferon Gold, TPMT Level, Vitamin D Level                           - Await pathology results.                           - Repeat colonoscopy at appointment to be scheduled                            to check healing and for polyp surveillance TBD.                           - The findings and recommendations were discussed  with the patient.                           - The findings and recommendations were discussed                            with the patient's family. Justice Britain, MD 12/04/2019 9:22:59 AM

## 2019-12-04 NOTE — Progress Notes (Signed)
Temp JB V/s KA

## 2019-12-06 LAB — QUANTIFERON-TB GOLD PLUS
Mitogen-NIL: >10 IU/mL
NIL: 0.12 IU/mL
QuantiFERON-TB Gold Plus: NEGATIVE
TB1-NIL: 0.01 IU/mL
TB2-NIL: 0.01 IU/mL

## 2019-12-06 LAB — HEPATITIS B CORE ANTIBODY, IGM: Hep B C IgM: NONREACTIVE

## 2019-12-06 LAB — HEPATITIS A ANTIBODY, TOTAL: Hepatitis A AB,Total: REACTIVE — AB

## 2019-12-06 LAB — VARICELLA ZOSTER ANTIBODY, IGG: Varicella IgG: 610.1 index

## 2019-12-06 LAB — HEPATITIS B SURFACE ANTIBODY,QUALITATIVE: Hep B S Ab: NONREACTIVE

## 2019-12-06 LAB — HEPATITIS B SURFACE ANTIGEN: Hepatitis B Surface Ag: NONREACTIVE

## 2019-12-08 ENCOUNTER — Other Ambulatory Visit: Payer: Self-pay

## 2019-12-08 ENCOUNTER — Telehealth: Payer: Self-pay | Admitting: *Deleted

## 2019-12-08 DIAGNOSIS — K529 Noninfective gastroenteritis and colitis, unspecified: Secondary | ICD-10-CM

## 2019-12-08 NOTE — Telephone Encounter (Signed)
  Follow up Call-  Call back number 12/04/2019  Post procedure Call Back phone  # [phone]  Permission to leave phone message Yes  Some recent data might be hidden     Patient questions:  Do you have a fever, pain , or abdominal swelling? No. Pain Score  0 *  Have you tolerated food without any problems? No.  Have you been able to return to your normal activities? Yes.    Do you have any questions about your discharge instructions: Diet   No. Medications  No. Follow up visit  yes  Do you have questions or concerns about your Care? yes  Actions: * If pain score is 4 or above: No action needed, pain <4.  1. Have you developed a fever since your procedure? no  2.   Have you had an respiratory symptoms (SOB or cough) since your procedure? no  3.   Have you tested positive for COVID 19 since your procedure no  4.   Have you had any family members/close contacts diagnosed with the COVID 19 since your procedure? no   If yes to any of these questions please route to Joylene John, RN and Erenest Rasher, RN

## 2019-12-09 ENCOUNTER — Other Ambulatory Visit: Payer: Self-pay

## 2019-12-09 ENCOUNTER — Other Ambulatory Visit (INDEPENDENT_AMBULATORY_CARE_PROVIDER_SITE_OTHER): Payer: Medicare HMO

## 2019-12-09 DIAGNOSIS — K529 Noninfective gastroenteritis and colitis, unspecified: Secondary | ICD-10-CM | POA: Diagnosis not present

## 2019-12-09 DIAGNOSIS — D509 Iron deficiency anemia, unspecified: Secondary | ICD-10-CM | POA: Diagnosis not present

## 2019-12-09 DIAGNOSIS — R935 Abnormal findings on diagnostic imaging of other abdominal regions, including retroperitoneum: Secondary | ICD-10-CM

## 2019-12-09 DIAGNOSIS — D122 Benign neoplasm of ascending colon: Secondary | ICD-10-CM | POA: Diagnosis not present

## 2019-12-09 LAB — CBC WITH DIFFERENTIAL/PLATELET
Basophils Absolute: 0.3 10*3/uL — ABNORMAL HIGH (ref 0.0–0.1)
Basophils Relative: 1.4 % (ref 0.0–3.0)
Eosinophils Absolute: 0.1 10*3/uL (ref 0.0–0.7)
Eosinophils Relative: 0.7 % (ref 0.0–5.0)
HCT: 32.2 % — ABNORMAL LOW (ref 36.0–46.0)
Hemoglobin: 9.9 g/dL — ABNORMAL LOW (ref 12.0–15.0)
Lymphocytes Relative: 19 % (ref 12.0–46.0)
Lymphs Abs: 3.9 10*3/uL (ref 0.7–4.0)
MCHC: 30.6 g/dL (ref 30.0–36.0)
MCV: 71.4 fl — ABNORMAL LOW (ref 78.0–100.0)
Monocytes Absolute: 1.2 10*3/uL — ABNORMAL HIGH (ref 0.1–1.0)
Monocytes Relative: 5.8 % (ref 3.0–12.0)
Neutro Abs: 15.2 10*3/uL — ABNORMAL HIGH (ref 1.4–7.7)
Neutrophils Relative %: 73.1 % (ref 43.0–77.0)
Platelets: 556 10*3/uL — ABNORMAL HIGH (ref 150.0–400.0)
RBC: 4.51 Mil/uL (ref 3.87–5.11)
RDW: 20.1 % — ABNORMAL HIGH (ref 11.5–15.5)
WBC: 20.8 10*3/uL (ref 4.0–10.5)

## 2019-12-09 LAB — IBC + FERRITIN
Ferritin: 29.8 ng/mL (ref 10.0–291.0)
Iron: <10 ug/dL — ABNORMAL LOW (ref 42–145)
Saturation Ratios: 3.3 % — ABNORMAL LOW (ref 20.0–50.0)
Transferrin: 215 mg/dL (ref 212.0–360.0)

## 2019-12-09 LAB — FERRITIN: Ferritin: 29.8 ng/mL (ref 10.0–291.0)

## 2019-12-09 LAB — VITAMIN B12: Vitamin B-12: 554 pg/mL (ref 211–911)

## 2019-12-09 LAB — FOLATE: Folate: >23.7 ng/mL (ref >5.9)

## 2019-12-10 ENCOUNTER — Encounter: Payer: Self-pay | Admitting: Gastroenterology

## 2019-12-10 LAB — IGG: IgG (Immunoglobin G), Serum: 870 mg/dL (ref 600–1540)

## 2019-12-10 LAB — HAPTOGLOBIN: Haptoglobin: 446 mg/dL — ABNORMAL HIGH (ref 43–212)

## 2019-12-10 LAB — RETICULOCYTES
ABS Retic: 62860 cells/uL (ref 20000–8000)
Retic Ct Pct: 1.4 %

## 2019-12-10 LAB — HEPATITIS B CORE ANTIBODY, TOTAL: Hep B Core Total Ab: NONREACTIVE

## 2019-12-11 ENCOUNTER — Telehealth: Payer: Self-pay | Admitting: Gastroenterology

## 2019-12-11 ENCOUNTER — Telehealth: Payer: Self-pay

## 2019-12-11 ENCOUNTER — Other Ambulatory Visit: Payer: Self-pay

## 2019-12-11 ENCOUNTER — Encounter: Payer: Self-pay | Admitting: Gastroenterology

## 2019-12-11 ENCOUNTER — Other Ambulatory Visit: Payer: Medicare HMO

## 2019-12-11 ENCOUNTER — Ambulatory Visit: Payer: Medicare HMO | Admitting: Gastroenterology

## 2019-12-11 VITALS — BP 124/64 | HR 103 | Temp 99.0°F | Ht 68.0 in | Wt 157.0 lb

## 2019-12-11 DIAGNOSIS — K529 Noninfective gastroenteritis and colitis, unspecified: Secondary | ICD-10-CM

## 2019-12-11 DIAGNOSIS — K50111 Crohn's disease of large intestine with rectal bleeding: Secondary | ICD-10-CM

## 2019-12-11 DIAGNOSIS — R935 Abnormal findings on diagnostic imaging of other abdominal regions, including retroperitoneum: Secondary | ICD-10-CM

## 2019-12-11 DIAGNOSIS — K626 Ulcer of anus and rectum: Secondary | ICD-10-CM | POA: Diagnosis not present

## 2019-12-11 DIAGNOSIS — K625 Hemorrhage of anus and rectum: Secondary | ICD-10-CM

## 2019-12-11 DIAGNOSIS — D500 Iron deficiency anemia secondary to blood loss (chronic): Secondary | ICD-10-CM | POA: Diagnosis not present

## 2019-12-11 LAB — TPMT GENETIC TEST

## 2019-12-11 LAB — HCV AB W/RFLX TO VERIFICATION: HCV Ab: <0.1 s/co ratio (ref 0.0–0.9)

## 2019-12-11 LAB — HCV INTERPRETATION

## 2019-12-11 MED ORDER — AZATHIOPRINE 50 MG PO TABS: 50.0000 mg | ORAL_TABLET | Freq: Every day | ORAL | 3 refills | Status: DC

## 2019-12-11 MED ORDER — HUMIRA (2 PEN) 40 MG/0.4ML ~~LOC~~ AJKT: 40.0000 mg | AUTO-INJECTOR | SUBCUTANEOUS | 11 refills | Status: DC

## 2019-12-11 MED ORDER — PREDNISONE 5 MG PO TABS
ORAL_TABLET | ORAL | 0 refills | Status: DC
Start: 2019-12-11 — End: 2020-01-28

## 2019-12-11 MED ORDER — HUMIRA-CD/UC/HS STARTER 40 MG/0.8ML ~~LOC~~ PNKT: PEN_INJECTOR | SUBCUTANEOUS | 0 refills | Status: DC

## 2019-12-11 MED ORDER — MESALAMINE 1.2 G PO TBEC
4.8000 g | DELAYED_RELEASE_TABLET | Freq: Every day | ORAL | 3 refills | Status: DC
Start: 2019-12-11 — End: 2020-01-15

## 2019-12-11 MED ORDER — FERROUS GLUCONATE 324 (38 FE) MG PO TABS
324.0000 mg | ORAL_TABLET | Freq: Every day | ORAL | 3 refills | Status: DC
Start: 2019-12-11 — End: 2020-04-09

## 2019-12-11 NOTE — Telephone Encounter (Signed)
-----   Message from [place], [place] sent at 12/11/2019  4:05 PM EDT ----- Regarding: Humira Please send Humira starter to [facility]. Dr.Mansouraty says 160/80/43m.   Pt has also been started on Lialda and AZA. I have sent those prescriptions to the local pharmacy as well as Prednisone prescription with taper instructions. Pt is to taper down to 245mof Prednisone and stay on 2012mnce daily.   I did tell pt that she would be contacted by [facility] regarding Humira. He wanted repeat labs in 2 weeks. I have entered lab order in epic.

## 2019-12-11 NOTE — Telephone Encounter (Signed)
Prescription for humira has been sent to [facility].

## 2019-12-11 NOTE — Telephone Encounter (Signed)
Returned call to [facility] pharmacist  to give clarification on QTY for prednisone.

## 2019-12-11 NOTE — Patient Instructions (Addendum)
If you are age 74 or older, your body mass index should be between 23-30. Your Body mass index is 23.87 kg/m. If this is out of the aforementioned range listed, please consider follow up with your Primary Care Provider.  If you are age 16 or younger, your body mass index should be between 19-25. Your Body mass index is 23.87 kg/m. If this is out of the aformentioned range listed, please consider follow up with your Primary Care Provider.    Your provider has requested that you go to the basement level for lab work before leaving today. Press "B" on the elevator. The lab is located at the first door on the left as you exit the elevator.  And then you will need to have labs again in 2 weeks(12/25/2019).   Taper Prednisone as discussed with Dr.Mansouraty. Once you have taper down to 23m- stay on 282monce daily.   We have sent the following medications to your pharmacy for you to pick up at your convenience: Azathioprine, Iron, Lialda.   [facility]. If you have not heard from them within a few days please contact office.   Due to recent changes in healthcare laws, you may see the results of your imaging and laboratory studies on MyChart before your provider has had a chance to review them.  We understand that in some cases there may be results that are confusing or concerning to you. Not all laboratory results come back in the same time frame and the provider may be waiting for multiple results in order to interpret others.  Please give usKorea8 hours in order for your provider to thoroughly review all the results before contacting the office for clarification of your results.   Thank you for choosing me and [facility].  Dr. MaRush Landmark

## 2019-12-12 ENCOUNTER — Encounter: Payer: Self-pay | Admitting: Gastroenterology

## 2019-12-12 ENCOUNTER — Telehealth: Payer: Self-pay | Admitting: Gastroenterology

## 2019-12-12 DIAGNOSIS — K529 Noninfective gastroenteritis and colitis, unspecified: Secondary | ICD-10-CM | POA: Insufficient documentation

## 2019-12-12 DIAGNOSIS — K50111 Crohn's disease of large intestine with rectal bleeding: Secondary | ICD-10-CM | POA: Insufficient documentation

## 2019-12-12 DIAGNOSIS — D500 Iron deficiency anemia secondary to blood loss (chronic): Secondary | ICD-10-CM | POA: Insufficient documentation

## 2019-12-12 NOTE — Telephone Encounter (Signed)
Dr [name] the pt has changed her mind in regards to remicade.  She would like an alternative IV option.  Please advise

## 2019-12-12 NOTE — Progress Notes (Signed)
[place] VISIT   Primary Care Provider Patient, No Pcp Per No address on file None  Patient Profile: Rhonda Bailey is a 74 y.o. female with a pmh significant for hypertension, anxiety, hypothyroidism, sleep apnea, iron deficiency anemia, nephrolithiasis, hemorrhoids (status post resection), GERD, likely new diagnosis of IBD (most likely Crohn's colitis versus pan ulcerative colitis).  The patient presents to the [facility] for an evaluation and management of problem(s) noted below:  Problem List 1. IBD (inflammatory bowel disease) - suspect Crohn's colitis versus Atypical Pan-Ulcerative colitis   2. Rectal ulcer   3. Iron deficiency anemia due to chronic blood loss   4. Rectal bleeding   5. Abnormal CT of the abdomen     History of Present Illness Please see initial inpatient consultation note as well as follow-up notes by Dr. Silverio Decamp and [facility] for full details of HPI.  In brief, the patient who was diagnosed with indeterminate colitis of the left colon in 2020 and treated with budesonide with partial improvement.  She continued to have symptoms and progressive worsening of rectal bleeding as well as diarrhea.  Eventually underwent a hemorrhoidectomy.  Was hospitalized in setting of severe pain and diarrhea.  Imaging suggested a near pancolitis as well as potential prior sinus tract or fistulous connection.  She was initiated on steroids and able to be discharged home and continued on steroids.  After initial follow-up in clinic she was set up for endoscopic evaluation and this was when she transferred care to myself.  I performed a colonoscopy on 29 April, which is the first time I met the patient.  Interval History The colonoscopy was very concerning for the potential of Crohn's colitis versus atypical pan ulcerative colitis.  Biopsies from the terminal ileum were negative for enteritis.  Biopsies of the entire colon from the right  and left and rectum as well as a rectal ulcer all showed chronic active colitis with the major severity in the left colon.  I spoken with the patient over the phone to give her results and to continue to discuss potential role of biologic therapy.  Patient continues to feel weak.  She has obtain med majority of her labs over the course of the last week and a half.  We had briefly discussed over the phone and again today in detail with the patient and her sister the role of biologic therapy as well as immunomodulators therapy as well as topical therapies.  Patient continues to have diarrhea on a daily basis with multiple bowel movements.  She is having rectal bleeding as well.  She is having tenesmus.  She continues to have inability to sit down for long periods of time.  She continues to have significant fatigue.  Patient is not taking significant nonsteroidals.  The patient has lost 4 pounds since her last clinic visit.  GI Review of Systems Positive as above including bloating Negative for dysphagia, odynophagia, vomiting, melena  Review of Systems General: Denies fevers/chills HEENT: Denies oral lesions Cardiovascular: Denies chest pain Pulmonary: Denies shortness of breath Gastroenterological: See HPI Genitourinary: Denies darkened urine Hematological: Positive for easy bruising/bleeding Endocrine: Denies temperature intolerance Dermatological: Denies jaundice Psychological: Mood is anxious   Medications Current Outpatient Medications  Medication Sig Dispense Refill  . acidophilus (RISAQUAD) CAPS capsule Take 1 capsule by mouth daily.    Marland Kitchen ALPRAZolam (XANAX) 0.5 MG tablet Take 0.5 mg by mouth at bedtime as needed for sleep.    Marland Kitchen amitriptyline (ELAVIL)  10 MG tablet 10 mg.    . ANECREAM5 5 % CREA APPLY TO ANUS EVERY 8 HOURS AS NEEDED FOR PAIN 30 g 1  . HYDROCORTISONE ACE, RECTAL, 30 MG SUPP Place 1 suppository (30 mg total) rectally in the morning and at bedtime. 12 suppository 3  .  levothyroxine (SYNTHROID, LEVOTHROID) 25 MCG tablet Take 25 mcg by mouth daily before breakfast.     . ondansetron (ZOFRAN ODT) 8 MG disintegrating tablet Take 1 tablet (8 mg total) by mouth every 8 (eight) hours as needed for nausea or vomiting. 20 tablet 0  . pantoprazole (PROTONIX) 40 MG tablet Take 40 mg by mouth at bedtime.     . predniSONE (DELTASONE) 10 MG tablet Take 1 tablet (10 mg total) by mouth daily with breakfast. Take 71m every morning for 2 weeks; then 262mevery morning for 2 weeks, then 1528mvery morning for 2 weeks; then 34m50mery morning for 2 weeks; then stop 80 tablet 0  . predniSONE (DELTASONE) 10 MG tablet Take 4 tablets (40 mg total) by mouth daily with breakfast for 14 days, THEN 3 tablets (30 mg total) daily with breakfast for 14 days. 120 tablet 0  . traZODone (DESYREL) 50 MG tablet Take 50 mg by mouth at bedtime as needed for sleep.     . Adalimumab (HUMIRA PEN) 40 MG/0.4ML PNKT Inject 40 mg into the skin every 14 (fourteen) days. 2 each 11  . Adalimumab (HUMIRA PEN-CD/UC/HS STARTER) 40 MG/0.8ML PNKT 160 mg day 1 then 80 mg day 15 6 each 0  . azaTHIOprine (IMURAN) 50 MG tablet Take 1 tablet (50 mg total) by mouth daily. 90 tablet 3  . ferrous gluconate (FERGON) 324 MG tablet Take 1 tablet (324 mg total) by mouth daily with breakfast. 30 tablet 3  . mesalamine (LIALDA) 1.2 g EC tablet Take 4 tablets (4.8 g total) by mouth daily with breakfast. 60 tablet 3  . predniSONE (DELTASONE) 5 MG tablet Take 8 tablets (40 mg total) by mouth daily for 10 days, THEN 7 tablets (35 mg total) daily for 7 days, THEN 6 tablets (30 mg total) daily for 7 days, THEN 5 tablets (25 mg total) daily for 7 days, THEN 4 tablets (20 mg total) daily. 100 tablet 0   No current facility-administered medications for this visit.    Allergies Allergies  Allergen Reactions  . Tramadol     Says "blew my head up."    Histories Past Medical History:  Diagnosis Date  . Anal fissure   . Anxiety     . Anxiety disorder, unspecified   . Essential (primary) hypertension   . Gastro-esophageal reflux disease with esophagitis   . GERD (gastroesophageal reflux disease)   . Hypokalemia   . Hypothyroidism   . Noninfective gastroenteritis and colitis, unspecified   . Sleep apnea    Past Surgical History:  Procedure Laterality Date  . BIOPSY  09/02/2018   Procedure: BIOPSY;  Surgeon: RourDaneil Dolin;  Location: [facility];  Service: Endoscopy;;  colon   . CHOLECYSTECTOMY  05/19/2011   Procedure: LAPAROSCOPIC CHOLECYSTECTOMY;  Surgeon: MarkJamesetta Soocation: [facility];  Service: General;  Laterality: N/A;  . COLONOSCOPY WITH PROPOFOL N/A 09/02/2018   Dr. RourGala Romneyoctocolitis limited to the left side with some exudate versus partial pseudomembrane formation, erosions and ulceration somewhat superficial.  Terminal ileum normal.  Diverticulosis.  Descending/sigmoid colon biopsy showing severe active colitis, diagnostic features of IBD not identified.  Differential includes infection,  ischemia.  Rectal biopsies with granulation tissue c/w ulcer  . CYSTOSCOPY W/ URETERAL STENT PLACEMENT Right 05/08/2019   Procedure: CYSTOSCOPY WITH RETROGRADE PYELOGRAM/URETERAL STENT PLACEMENT;  Surgeon: Festus Aloe, MD;  Location: [facility];  Service: Urology;  Laterality: Right;  . CYSTOSCOPY WITH URETEROSCOPY Right 06/11/2019   Procedure: CYSTOSCOPY WITH DIAGNOSTIC URETEROSCOPY; URETERAL STENT REMOVAL;  Surgeon: Cleon Gustin, MD;  Location: [facility];  Service: Urology;  Laterality: Right;  . HEMORRHOID SURGERY N/A 09/16/2019   Procedure: EXTENSIVE HEMORRHOIDECTOMY;  Surgeon: Aviva Signs, MD;  Location: [facility];  Service: General;  Laterality: N/A;  . RECTAL BIOPSY N/A 09/16/2019   Procedure: BIOPSY RECTAL;  Surgeon: Aviva Signs, MD;  Location: [facility];  Service: General;  Laterality: N/A;  . TUBAL LIGATION     Social History   Socioeconomic History  . Marital status: Divorced    Spouse name: Not on  file  . Number of children: 2  . Years of education: Not on file  . Highest education level: Not on file  Occupational History  . Occupation: retired  Tobacco Use  . Smoking status: Never Smoker  . Smokeless tobacco: Never Used  Substance and Sexual Activity  . Alcohol use: Not Currently    Comment: once a year  . Drug use: No  . Sexual activity: Not Currently  Other Topics Concern  . Not on file  Social History Narrative  . Not on file   Social Determinants of Health   Financial Resource Strain: Low Risk   . Difficulty of Paying Living Expenses: Not hard at all  Food Insecurity: No Food Insecurity  . Worried About Charity fundraiser in the Last Year: Never true  . Ran Out of Food in the Last Year: Never true  Transportation Needs: No Transportation Needs  . Lack of Transportation (Medical): No  . Lack of Transportation (Non-Medical): No  Physical Activity: Sufficiently Active  . Days of Exercise per Week: 5 days  . Minutes of Exercise per Session: 30 min  Stress: No Stress Concern Present  . Feeling of Stress : Not at all  Social Connections: Moderately Isolated  . Frequency of Communication with Friends and Family: More than three times a week  . Frequency of Social Gatherings with Friends and Family: More than three times a week  . Attends Religious Services: Never  . Active Member of Clubs or Organizations: No  . Attends Archivist Meetings: Never  . Marital Status: Divorced  Human resources officer Violence: Not At Risk  . Fear of Current or Ex-Partner: No  . Emotionally Abused: No  . Physically Abused: No  . Sexually Abused: No   Family History  Problem Relation Age of Onset  . Cancer Father        mouth  . Heart disease Sister   . Heart disease Brother   . Cancer Brother        head  . Cancer Daughter        ? type  . Anesthesia problems Neg Hx   . Hypotension Neg Hx   . Malignant hyperthermia Neg Hx   . Pseudochol deficiency Neg Hx   . Colon  cancer Neg Hx   . Esophageal cancer Neg Hx   . Inflammatory bowel disease Neg Hx   . Liver disease Neg Hx   . Pancreatic cancer Neg Hx   . Rectal cancer Neg Hx   . Stomach cancer Neg Hx    I have reviewed her medical, social,  and family history in detail and updated the electronic medical record as necessary.    PHYSICAL EXAMINATION  BP 124/64   Pulse (!) 103   Temp 99 F (37.2 C)   Ht 5' 8"  (1.727 m)   Wt 157 lb (71.2 kg)   BMI 23.87 kg/m  Wt Readings from Last 3 Encounters:  12/11/19 157 lb (71.2 kg)  12/04/19 161 lb (73 kg)  10/29/19 161 lb 6 oz (73.2 kg)  GEN: Ill-appearing but not toxic, sitting for a few seconds and standing for the majority of the clinic visit, accompanied by sister PSYCH: Cooperative, without pressured speech EYE: Conjunctivae pink, sclerae anicteric ENT: MMM, without oral ulcers CV: Nontachycardic RESP: No wheezing apparent GI: NABS, soft, mildly protuberant, minimal tenderness to palpation, no rebound, mild volitional guarding MSK/EXT: No significant lower extremity edema SKIN: No jaundice NEURO:  Alert & Oriented x 3, no focal deficits   REVIEW OF DATA  I reviewed the following data at the time of this encounter:  GI Procedures and Studies  December 04, 2019 colonoscopy - Rectal tenderness, hemorrhoids and rectal stricture found on digital rectal exam. - The examined portion of the ileum was normal. Biopsied. - 8 mm polyp was found in the proximal ascending colon. The polyp was sessile. The polyp was removed with a cold snare. Resection and retrieval were complete. - Colitis. Inflammation was found in the transverse colon, in the hepatic flexure, in the ascending colon and at the cecum. This was graded as Mayo Score 1 (mild disease) and graded as Mayo Score 2 (moderate disease). Biopsied. - Colitis. Inflammation was found from the rectum to the splenic flexure. This was graded as Mayo Score 3 (severe disease). Biopsied. - A single (solitary)  ulcer in the distal rectum. Biopsied. - Diverticulosis in the entire examined colon. - Non-bleeding non-thrombosed external and internal hemorrhoids. Pathology Diagnosis 1. Surgical [P], small bowel, terminal ileum - ILEAL MUCOSA WITH NO SIGNIFICANT PATHOLOGIC FINDINGS. - NEGATIVE FOR ACTIVE INFLAMMATION, GRANULOMATA AND DYSPLASIA. 2. Surgical [P], right colon - MILD-MODERATELY ACTIVE CHRONIC COLITIS. - NEGATIVE FOR GRANULOMATA AND DYSPLASIA. 3. Surgical [P], colon, ascending, polyp - POLYPOID COLONIC MUCOSA WITH MILD HYPERPLASTIC CHANGES. - NEGATIVE FOR DYSPLASIA. 4. Surgical [P], left colon - SEVERELY ACTIVE CHRONIC COLITIS WITH ULCERATION. - NEGATIVE FOR GRANULOMATA AND DYSPLASIA. 5. Surgical [P], colon, rectum - SEVERELY ACTIVE CHRONIC COLITIS WITH ULCERATION. - NEGATIVE FOR GRANULOMATA AND DYSPLASIA. 6. Surgical [P], colon, distal rectal ulcer - SEVERELY ACTIVE CHRONIC COLITIS WITH ULCERATION. - NEGATIVE FOR GRANULOMATA AND DYSPLASIA.  Laboratory Studies  Reviewed those in epic that I have recently obtained  Imaging Studies  Review of 3/21 CT abdomen pelvis with contrast IMPRESSION: 1. Signs of colitis favoring infectious or inflammatory colitis given distribution. Findings not classic for diverticulitis given the long segment involvement. Correlate with recent antibiotic administration and for evidence of C difficile colitis. 2. No evidence of intraperitoneal fluid collection or signs of free air. 3. Perianal sinus tract or fistulous network is considered. No signs of perianal abscess. 4. Nonocclusive thrombus suspected in the right ovarian vein. 5. Stable appearance of left adrenal lesion. This remains indeterminate. Consider follow-up CT adrenal protocol. 6. Stable mild thickening of the appendix.   ASSESSMENT  Ms. Abee is a 74 y.o. female with a pmh significant for hypertension, anxiety, hypothyroidism, sleep apnea, iron deficiency anemia, nephrolithiasis,  hemorrhoids (status post resection), GERD, likely new diagnosis of IBD (most likely Crohn's colitis versus pan ulcerative colitis).  The patient is seen today  for evaluation and management of:  1. IBD (inflammatory bowel disease) - suspect Crohn's colitis versus Atypical Pan-Ulcerative colitis   2. Rectal ulcer   3. Iron deficiency anemia due to chronic blood loss   4. Rectal bleeding   5. Abnormal CT of the abdomen    The patient is hemodynamically stable.  However clinically, the patient most likely has inflammatory bowel disease.  I suspect this is Crohn's colitis based on her prior CT imaging concerning for potential fistula although she has not ever had true fistulous drainage but she reports.  In a typical pan ulcerative colitis with a significant rectal ulceration could also still be possible.  Either way, patient needs therapy that is more than just immunosuppressive steroids.  We spent a significant amount of time discussing the role of topical therapies including Lialda which are unlikely to be the end all for the patient's therapy but may be of some benefit and so we will plan to proceed with mesalamine product as outlined below.  The role of biologic therapy as well as immunomodulators therapy was discussed in great detail with the patient and sister.  We discussed the potential side effect profile of these medications including the development of cytopenias, liver test abnormalities, kidney dysfunction, increased risk for tuberculosis as well as hepatitis, increased risk of cancer development.  However, I do not think that we have much time to be able to stay only on steroids.  I have recommended Humira versus Remicade.  Our discussion today has led to the decision to move forward with Humira as well as low-dose 6-MP.  I will obtain TPMT testing and slowly increase her dosing over time in order of trying to decrease the risk of antibody development of Humira.  She will need close monitoring  follow-up.  We will get laboratories within a couple weeks of initiation of her Humira which we hope to do in the next week.  We will follow inflammatory markers.  Hopefully this will be able to stave off what has been ongoing for quite a few months now.  We will slowly down titrate her steroids by 5 mg every week and maintain a 20 mg till we hopefully have had some efficacy.  She will undergo standard Humira induction therapy.  All patient questions were answered, to the best of my ability, and the patient agrees to the aforementioned plan of action with follow-up as indicated.   PLAN  Laboratories as outlined below to be drawn within the next 2 weeks Humira induction -160 mg then 2 weeks later 80 mg then 2 weeks later 40 mg then 40 mg thereafter every 2 weeks Azathioprine 50 mg daily and eventually will increase titration slowly for antibody prevention Continue prednisone taper 5 mg every week and maintain a 20 mg until we end up seeing how she ends up doing Follow-up TPMT testing Laboratories to be drawn every few weeks based on patient's induction   Orders Placed This Encounter  Procedures  . CBC w/Diff  . Comp Met (CMET)  . Sedimentation rate  . CRP High sensitivity  . Thiopurine methyltransferase(tpmt)rbc    New Prescriptions   ADALIMUMAB (HUMIRA PEN) 40 MG/0.4ML PNKT    Inject 40 mg into the skin every 14 (fourteen) days.   ADALIMUMAB (HUMIRA PEN-CD/UC/HS STARTER) 40 MG/0.8ML PNKT    160 mg day 1 then 80 mg day 15   AZATHIOPRINE (IMURAN) 50 MG TABLET    Take 1 tablet (50 mg total) by mouth daily.  FERROUS GLUCONATE (FERGON) 324 MG TABLET    Take 1 tablet (324 mg total) by mouth daily with breakfast.   MESALAMINE (LIALDA) 1.2 G EC TABLET    Take 4 tablets (4.8 g total) by mouth daily with breakfast.   PREDNISONE (DELTASONE) 5 MG TABLET    Take 8 tablets (40 mg total) by mouth daily for 10 days, THEN 7 tablets (35 mg total) daily for 7 days, THEN 6 tablets (30 mg total) daily for 7  days, THEN 5 tablets (25 mg total) daily for 7 days, THEN 4 tablets (20 mg total) daily.   Modified Medications   No medications on file    Planned Follow Up No follow-ups on file.   Total Time in Face-to-Face and in Coordination of Care for patient including independent/personal interpretation/review of prior testing, medical history, examination, medication adjustment, communicating results with the patient directly, and documentation with the EHR is more than 40 minutes.   Justice Britain, MD [facility] Office # [phone]

## 2019-12-12 NOTE — Telephone Encounter (Signed)
Prior auth sent to [facility] for Remicade.

## 2019-12-12 NOTE — Telephone Encounter (Signed)
I called and spoke with patient. She would like to do Remicade rather than Humira. We had discussed both yesterday. I am OK with this. Standard induction Remicade 0/2/6/8 to be performed. She should have standard Premedications for Remicade. With each infusion she should have standing order CBC/CMP/ESR/CRP. Thank you. GM

## 2019-12-12 NOTE — Telephone Encounter (Signed)
Pt called stating that she changed her mind about injections and prefers to have IV. Pls call her.

## 2019-12-13 DIAGNOSIS — K625 Hemorrhage of anus and rectum: Secondary | ICD-10-CM | POA: Insufficient documentation

## 2019-12-15 ENCOUNTER — Telehealth: Payer: Self-pay | Admitting: Gastroenterology

## 2019-12-15 NOTE — Telephone Encounter (Signed)
Amy any news on Remicade prior auth

## 2019-12-15 NOTE — Telephone Encounter (Signed)
Records faxed to [facility]

## 2019-12-15 NOTE — Telephone Encounter (Signed)
I spoke with the pt and she prefers to see how much Humira is going to cost.  I have submitted the information with office notes and labs to [facility].  I will call the pt when I get information from her if she wishes to proceed.

## 2019-12-15 NOTE — Telephone Encounter (Signed)
Yes, but I thought we still needed the prior auth ?

## 2019-12-15 NOTE — Telephone Encounter (Signed)
I'll check with Colletta Maryland at [facility], but with [facility]... they are not quick at all.

## 2019-12-15 NOTE — Telephone Encounter (Signed)
You told me on Friday that you were sending the referral to [facility].

## 2019-12-15 NOTE — Telephone Encounter (Signed)
Called Atkinson @ [facility] about referral for Ms. Hensarling.  She said that the patient's portion will be 20% co-insurance.  Which would be about a minimum of $600 per infusion.  She said that she is not able to provide patient assistance for this medication with her type of insurance.  I mentioned she was going to do Humira, but changed her mind.  She said that she could get patient assistance with [facility] for that medication.  I thanked her for the information and told her I would pass it along.  If she is ok with paying the co-insurance up front, Colletta Maryland can get the authorization for it.

## 2019-12-16 NOTE — Telephone Encounter (Signed)
Thank you for update Patty. Any sense of timing for follow up from [facility]? Hopefully we can get her on biologic therapy soon. Thanks. GM

## 2019-12-17 NOTE — Telephone Encounter (Signed)
Thank you for update. GM

## 2019-12-17 NOTE — Telephone Encounter (Signed)
Thanks. GM

## 2019-12-17 NOTE — Telephone Encounter (Signed)
I spoke with Rhonda Bailey and made her aware of the pt choice to proceed with Remicade.  She will call back if I need to do anything further.

## 2019-12-17 NOTE — Telephone Encounter (Signed)
They are usually very responsive. I will keep updated

## 2019-12-17 NOTE — Telephone Encounter (Signed)
Patient called stated she would like to proceed with infusion and not the shot

## 2019-12-17 NOTE — Telephone Encounter (Signed)
Left a message at [phone] Rhonda Bailey to make her aware that she would like to have infusion.  She is aware of the cost but wants to proceed.   FYI Dr [name].

## 2019-12-18 ENCOUNTER — Other Ambulatory Visit: Payer: Self-pay | Admitting: Gastroenterology

## 2019-12-18 ENCOUNTER — Ambulatory Visit: Payer: Medicare HMO | Admitting: Gastroenterology

## 2019-12-21 LAB — THIOPURINE METHYLTRANSFERASE (TPMT), RBC: Thiopurine Methyltransferase, RBC: 10 nmol/hr/mL RBC — ABNORMAL LOW

## 2019-12-22 NOTE — Progress Notes (Signed)
Patient has intermediate activity to TPMT. I likely will maintain her on low-dose immunomodulators therapy. I will plan to recheck her CBC and CMP at least every 2 weeks for the first 2 to 3 months so that we can monitor her for any issues that may develop. After 1 month of therapy I will proceed with obtaining metabolite testing. Titration of her immune modulator may be considered with a very close metabolite testing.

## 2019-12-24 ENCOUNTER — Telehealth: Payer: Self-pay

## 2019-12-24 ENCOUNTER — Other Ambulatory Visit: Payer: Self-pay | Admitting: Gastroenterology

## 2019-12-24 DIAGNOSIS — D122 Benign neoplasm of ascending colon: Secondary | ICD-10-CM

## 2019-12-24 DIAGNOSIS — K529 Noninfective gastroenteritis and colitis, unspecified: Secondary | ICD-10-CM

## 2019-12-24 NOTE — Telephone Encounter (Signed)
Rhonda Bailey,  Spoke with [employer] and initiated an authorization for remicaide.  I will fax in clinicals to [phone] Claim# [id number]

## 2019-12-24 NOTE — Telephone Encounter (Signed)
Rhonda Bailey I received a call from [name] at [facility] regarding remicade for this pt.  She needs a prior auth completed. The pt was offered humira but she declined and wished to proceed with Remicade.  Colletta Maryland said you can call her if you have any questions. [phone]

## 2019-12-25 ENCOUNTER — Other Ambulatory Visit (INDEPENDENT_AMBULATORY_CARE_PROVIDER_SITE_OTHER): Payer: Medicare HMO

## 2019-12-25 DIAGNOSIS — R935 Abnormal findings on diagnostic imaging of other abdominal regions, including retroperitoneum: Secondary | ICD-10-CM

## 2019-12-25 DIAGNOSIS — K529 Noninfective gastroenteritis and colitis, unspecified: Secondary | ICD-10-CM

## 2019-12-25 LAB — COMPREHENSIVE METABOLIC PANEL
ALT: 10 U/L (ref 0–35)
AST: 10 U/L (ref 0–37)
Albumin: 4 g/dL (ref 3.5–5.2)
Alkaline Phosphatase: 75 U/L (ref 39–117)
BUN: 24 mg/dL — ABNORMAL HIGH (ref 6–23)
CO2: 27 mEq/L (ref 19–32)
Calcium: 9.3 mg/dL (ref 8.4–10.5)
Chloride: 98 mEq/L (ref 96–112)
Creatinine, Ser: 0.99 mg/dL (ref 0.40–1.20)
GFR: 54.8 mL/min — ABNORMAL LOW (ref >60.00)
Glucose, Bld: 174 mg/dL — ABNORMAL HIGH (ref 70–99)
Potassium: 4.1 mEq/L (ref 3.5–5.1)
Sodium: 134 mEq/L — ABNORMAL LOW (ref 135–145)
Total Bilirubin: 0.3 mg/dL (ref 0.2–1.2)
Total Protein: 7.2 g/dL (ref 6.0–8.3)

## 2019-12-25 LAB — CBC WITH DIFFERENTIAL/PLATELET
Basophils Absolute: 0.1 10*3/uL (ref 0.0–0.1)
Basophils Relative: 0.7 % (ref 0.0–3.0)
Eosinophils Absolute: 0 10*3/uL (ref 0.0–0.7)
Eosinophils Relative: 0.1 % (ref 0.0–5.0)
HCT: 33.3 % — ABNORMAL LOW (ref 36.0–46.0)
Hemoglobin: 10.2 g/dL — ABNORMAL LOW (ref 12.0–15.0)
Lymphocytes Relative: 9.9 % — ABNORMAL LOW (ref 12.0–46.0)
Lymphs Abs: 1.5 10*3/uL (ref 0.7–4.0)
MCHC: 30.7 g/dL (ref 30.0–36.0)
MCV: 71.9 fl — ABNORMAL LOW (ref 78.0–100.0)
Monocytes Absolute: 0.5 10*3/uL (ref 0.1–1.0)
Monocytes Relative: 3.7 % (ref 3.0–12.0)
Neutro Abs: 12.6 10*3/uL — ABNORMAL HIGH (ref 1.4–7.7)
Neutrophils Relative %: 85.6 % — ABNORMAL HIGH (ref 43.0–77.0)
Platelets: 466 10*3/uL — ABNORMAL HIGH (ref 150.0–400.0)
RBC: 4.64 Mil/uL (ref 3.87–5.11)
RDW: 20.2 % — ABNORMAL HIGH (ref 11.5–15.5)
WBC: 14.7 10*3/uL — ABNORMAL HIGH (ref 4.0–10.5)

## 2019-12-25 LAB — HIGH SENSITIVITY CRP: CRP, High Sensitivity: 44.28 mg/L — ABNORMAL HIGH (ref 0.000–5.000)

## 2019-12-25 LAB — SEDIMENTATION RATE: Sed Rate: 73 mm/hr — ABNORMAL HIGH (ref 0–30)

## 2019-12-30 ENCOUNTER — Other Ambulatory Visit: Payer: Self-pay

## 2019-12-30 DIAGNOSIS — K50111 Crohn's disease of large intestine with rectal bleeding: Secondary | ICD-10-CM

## 2019-12-31 DIAGNOSIS — K5190 Ulcerative colitis, unspecified, without complications: Secondary | ICD-10-CM | POA: Diagnosis not present

## 2019-12-31 DIAGNOSIS — K529 Noninfective gastroenteritis and colitis, unspecified: Secondary | ICD-10-CM | POA: Diagnosis not present

## 2019-12-31 DIAGNOSIS — K5010 Crohn's disease of large intestine without complications: Secondary | ICD-10-CM | POA: Diagnosis not present

## 2019-12-31 DIAGNOSIS — Z79899 Other long term (current) drug therapy: Secondary | ICD-10-CM | POA: Diagnosis not present

## 2020-01-09 ENCOUNTER — Other Ambulatory Visit (INDEPENDENT_AMBULATORY_CARE_PROVIDER_SITE_OTHER): Payer: Medicare HMO

## 2020-01-09 ENCOUNTER — Other Ambulatory Visit: Payer: Self-pay

## 2020-01-09 DIAGNOSIS — K50111 Crohn's disease of large intestine with rectal bleeding: Secondary | ICD-10-CM | POA: Diagnosis not present

## 2020-01-09 LAB — COMPREHENSIVE METABOLIC PANEL
ALT: 9 U/L (ref 0–35)
AST: 11 U/L (ref 0–37)
Albumin: 3.9 g/dL (ref 3.5–5.2)
Alkaline Phosphatase: 72 U/L (ref 39–117)
BUN: 17 mg/dL (ref 6–23)
CO2: 29 mEq/L (ref 19–32)
Calcium: 9.3 mg/dL (ref 8.4–10.5)
Chloride: 101 mEq/L (ref 96–112)
Creatinine, Ser: 0.88 mg/dL (ref 0.40–1.20)
GFR: 62.77 mL/min (ref >60.00)
Glucose, Bld: 123 mg/dL — ABNORMAL HIGH (ref 70–99)
Potassium: 3.8 mEq/L (ref 3.5–5.1)
Sodium: 140 mEq/L (ref 135–145)
Total Bilirubin: 0.3 mg/dL (ref 0.2–1.2)
Total Protein: 6.8 g/dL (ref 6.0–8.3)

## 2020-01-09 LAB — CBC WITH DIFFERENTIAL/PLATELET
Basophils Absolute: 0.1 10*3/uL (ref 0.0–0.1)
Basophils Relative: 0.5 % (ref 0.0–3.0)
Eosinophils Absolute: 0.1 10*3/uL (ref 0.0–0.7)
Eosinophils Relative: 0.7 % (ref 0.0–5.0)
HCT: 33.2 % — ABNORMAL LOW (ref 36.0–46.0)
Hemoglobin: 10.2 g/dL — ABNORMAL LOW (ref 12.0–15.0)
Lymphocytes Relative: 6.3 % — ABNORMAL LOW (ref 12.0–46.0)
Lymphs Abs: 0.8 10*3/uL (ref 0.7–4.0)
MCHC: 30.7 g/dL (ref 30.0–36.0)
MCV: 72 fl — ABNORMAL LOW (ref 78.0–100.0)
Monocytes Absolute: 0.3 10*3/uL (ref 0.1–1.0)
Monocytes Relative: 2.5 % — ABNORMAL LOW (ref 3.0–12.0)
Neutro Abs: 11.8 10*3/uL — ABNORMAL HIGH (ref 1.4–7.7)
Neutrophils Relative %: 90 % — ABNORMAL HIGH (ref 43.0–77.0)
Platelets: 503 10*3/uL — ABNORMAL HIGH (ref 150.0–400.0)
RBC: 4.61 Mil/uL (ref 3.87–5.11)
RDW: 19.4 % — ABNORMAL HIGH (ref 11.5–15.5)
WBC: 13.1 10*3/uL — ABNORMAL HIGH (ref 4.0–10.5)

## 2020-01-12 ENCOUNTER — Telehealth: Payer: Self-pay | Admitting: Gastroenterology

## 2020-01-12 NOTE — Telephone Encounter (Signed)
The pt has been advised that per the [employer] foundation her name is on the list to receive a call for shipment or she may call.  I provided her the number and answered all her questions.

## 2020-01-12 NOTE — Telephone Encounter (Signed)
Patient is calling to let you know she has not heard from anyone to schedule as of yet to start her shots

## 2020-01-15 ENCOUNTER — Other Ambulatory Visit: Payer: Self-pay | Admitting: Gastroenterology

## 2020-01-20 ENCOUNTER — Other Ambulatory Visit: Payer: Self-pay | Admitting: Gastroenterology

## 2020-01-23 ENCOUNTER — Telehealth: Payer: Self-pay | Admitting: Gastroenterology

## 2020-01-23 NOTE — Telephone Encounter (Signed)
Patient is seeking advise on how to do the injections and also stated she only has enough prednisone to last her till July not Aug that she follows up with Dr. [name]

## 2020-01-23 NOTE — Telephone Encounter (Signed)
The pt states she is not sure how to do the stelara injections.  I advised her that the [employer] foundation will call her with support for teaching the injection process. The pt was provided the number if she needs to call.  Also, she states she will contact her pharmacy when she runs out of prednisone.

## 2020-01-27 ENCOUNTER — Telehealth: Payer: Self-pay | Admitting: Gastroenterology

## 2020-01-27 NOTE — Telephone Encounter (Signed)
The pt states she was told that a nurse would be out at 1030 am om this past Monday.  She says no one showed up.  I provided her with the [employer] program number and she will call back to confirm when someone will come out to assist her.    If you have questions about HUMIRA Complete or need further assistance, give [place] a call at [phone] ([phone])

## 2020-01-28 ENCOUNTER — Other Ambulatory Visit: Payer: Self-pay | Admitting: Gastroenterology

## 2020-01-28 ENCOUNTER — Telehealth: Payer: Self-pay | Admitting: Gastroenterology

## 2020-01-28 MED ORDER — PREDNISONE 20 MG PO TABS: 20.0000 mg | ORAL_TABLET | Freq: Every day | ORAL | 0 refills | Status: DC

## 2020-01-28 NOTE — Telephone Encounter (Signed)
Rhonda Bailey,  I am not sure if this is something that we do. Can you return call to pt please?

## 2020-01-28 NOTE — Telephone Encounter (Signed)
I spoke with the [facility] with Rhonda Bailey and "Gerald Stabs" states she will expedite the request for a nurse to come out to the pt home for injection training.  I called the pt and notified her that if she does not hear from that office by 12 noon tomorrow she should call me back and I will call them again.  Prescription for prednisone was sent to the pharmacy.

## 2020-01-28 NOTE — Telephone Encounter (Signed)
Patient calling, asking for refill for prednisone, states Dr. [name] wanted her to take it until she came and seen him, appointment is 8/6. Also asking, the injections that Dr. [name] wanted her to get, she is still unable to get a hold of anyone to come out and show her how to do them, she states she is going out of town this weekend and asking if no one shows up can she come here and someone show her how to use them.

## 2020-01-30 ENCOUNTER — Telehealth: Payer: Self-pay | Admitting: Gastroenterology

## 2020-02-02 ENCOUNTER — Telehealth: Payer: Self-pay | Admitting: Gastroenterology

## 2020-02-02 NOTE — Telephone Encounter (Signed)
Patty, I have not seen a form for this pt. Have you?

## 2020-02-02 NOTE — Telephone Encounter (Signed)
I faxed it this morning at 815 am.  I can refax it now

## 2020-02-02 NOTE — Telephone Encounter (Signed)
The pt has been advised that [employer] sent an order for injection training this morning. They should contact her today.  She was advised to call back if she still does not hear from [facility] this week.

## 2020-02-02 NOTE — Telephone Encounter (Signed)
Thanks

## 2020-02-06 DIAGNOSIS — G4700 Insomnia, unspecified: Secondary | ICD-10-CM | POA: Diagnosis not present

## 2020-02-06 DIAGNOSIS — E7800 Pure hypercholesterolemia, unspecified: Secondary | ICD-10-CM | POA: Diagnosis not present

## 2020-02-06 DIAGNOSIS — I10 Essential (primary) hypertension: Secondary | ICD-10-CM | POA: Diagnosis not present

## 2020-02-06 DIAGNOSIS — E039 Hypothyroidism, unspecified: Secondary | ICD-10-CM | POA: Diagnosis not present

## 2020-02-23 ENCOUNTER — Other Ambulatory Visit: Payer: Self-pay | Admitting: Gastroenterology

## 2020-03-12 ENCOUNTER — Other Ambulatory Visit (INDEPENDENT_AMBULATORY_CARE_PROVIDER_SITE_OTHER): Payer: Medicare HMO

## 2020-03-12 ENCOUNTER — Ambulatory Visit: Payer: Medicare HMO | Admitting: Gastroenterology

## 2020-03-12 ENCOUNTER — Encounter: Payer: Self-pay | Admitting: Gastroenterology

## 2020-03-12 VITALS — BP 180/70 | HR 80 | Ht 65.5 in | Wt 168.2 lb

## 2020-03-12 DIAGNOSIS — K529 Noninfective gastroenteritis and colitis, unspecified: Secondary | ICD-10-CM | POA: Diagnosis not present

## 2020-03-12 DIAGNOSIS — Z79899 Other long term (current) drug therapy: Secondary | ICD-10-CM | POA: Diagnosis not present

## 2020-03-12 DIAGNOSIS — K625 Hemorrhage of anus and rectum: Secondary | ICD-10-CM | POA: Diagnosis not present

## 2020-03-12 DIAGNOSIS — K50111 Crohn's disease of large intestine with rectal bleeding: Secondary | ICD-10-CM | POA: Diagnosis not present

## 2020-03-12 LAB — COMPREHENSIVE METABOLIC PANEL
ALT: 12 U/L (ref 0–35)
AST: 11 U/L (ref 0–37)
Albumin: 4.2 g/dL (ref 3.5–5.2)
Alkaline Phosphatase: 72 U/L (ref 39–117)
BUN: 22 mg/dL (ref 6–23)
CO2: 30 mEq/L (ref 19–32)
Calcium: 9.5 mg/dL (ref 8.4–10.5)
Chloride: 103 mEq/L (ref 96–112)
Creatinine, Ser: 0.98 mg/dL (ref 0.40–1.20)
GFR: 55.41 mL/min — ABNORMAL LOW (ref >60.00)
Glucose, Bld: 140 mg/dL — ABNORMAL HIGH (ref 70–99)
Potassium: 4.1 mEq/L (ref 3.5–5.1)
Sodium: 139 mEq/L (ref 135–145)
Total Bilirubin: 0.4 mg/dL (ref 0.2–1.2)
Total Protein: 7 g/dL (ref 6.0–8.3)

## 2020-03-12 LAB — CBC WITH DIFFERENTIAL/PLATELET
Basophils Absolute: 0 10*3/uL (ref 0.0–0.1)
Basophils Relative: 0.3 % (ref 0.0–3.0)
Eosinophils Absolute: 0 10*3/uL (ref 0.0–0.7)
Eosinophils Relative: 0.2 % (ref 0.0–5.0)
HCT: 37.7 % (ref 36.0–46.0)
Hemoglobin: 11.3 g/dL — ABNORMAL LOW (ref 12.0–15.0)
Lymphocytes Relative: 8.7 % — ABNORMAL LOW (ref 12.0–46.0)
Lymphs Abs: 1.3 10*3/uL (ref 0.7–4.0)
MCHC: 30 g/dL (ref 30.0–36.0)
MCV: 73.2 fl — ABNORMAL LOW (ref 78.0–100.0)
Monocytes Absolute: 0.3 10*3/uL (ref 0.1–1.0)
Monocytes Relative: 1.7 % — ABNORMAL LOW (ref 3.0–12.0)
Neutro Abs: 13.8 10*3/uL — ABNORMAL HIGH (ref 1.4–7.7)
Neutrophils Relative %: 89.1 % — ABNORMAL HIGH (ref 43.0–77.0)
Platelets: 436 10*3/uL — ABNORMAL HIGH (ref 150.0–400.0)
RBC: 5.15 Mil/uL — ABNORMAL HIGH (ref 3.87–5.11)
RDW: 20.9 % — ABNORMAL HIGH (ref 11.5–15.5)
WBC: 15.4 10*3/uL — ABNORMAL HIGH (ref 4.0–10.5)

## 2020-03-12 LAB — SEDIMENTATION RATE: Sed Rate: 22 mm/hr (ref 0–30)

## 2020-03-12 LAB — HIGH SENSITIVITY CRP: CRP, High Sensitivity: 11.54 mg/L — ABNORMAL HIGH (ref 0.000–5.000)

## 2020-03-12 MED ORDER — PREDNISONE 5 MG PO TABS
ORAL_TABLET | ORAL | 0 refills | Status: DC
Start: 2020-03-12 — End: 2020-05-18

## 2020-03-12 NOTE — Progress Notes (Signed)
[facility] VISIT   Primary Care Provider Patient, No Pcp Per No address on file None  Patient Profile: Rhonda Bailey is a 74 y.o. female with a pmh significant for hypertension, anxiety, hypothyroidism, sleep apnea, iron deficiency anemia, nephrolithiasis, hemorrhoids (status post resection), GERD, likely new diagnosis of IBD (most likely Crohn's colitis versus pan ulcerative colitis).  The patient presents to the [facility] for an evaluation and management of problem(s) noted below:  Problem List 1. IBD (inflammatory bowel disease)   2. Long term current use of immunosuppressive drug   3. Rectal bleeding     History of Present Illness Please see initial inpatient consultation note as well as follow-up notes by Dr. Silverio Decamp and PA [facility] and my prior progress note for full details of HPI.  Interval History Since her last visit, the patient has finally been initiated on a biologic agent.  Initially we were going to do Remicade and then due to patient preference as well as insurance, we transitioned to Humira.  She has completed her induction and now is on once every 2 weeks Mira injections.  Her last injection was last Saturday.  The patient states that she is 75% better than she was when we last saw her.  She is able to get up and start doing things which she had not been previously.  She is having mostly her bowel movements in the morning.  Urgency persists but is much improved.  She has the ability to make it to the restroom.  No incontinence.  She continues to take her Imuran without issue.  She remains on steroids at 20 mg daily as she has not tapered off until today's visit with hope that she will be able to come down on it soon.  She remains on mesalamine as well.  BMs per day -2-4 Nocturnal BMs -none Blood -none Mucous -none Tenesmus -none Urgency -yes but improved Skin Manifestations -no new issues Eye Manifestations -no new  issues Joint Manifestations -no new issues  GI Review of Systems Positive as above Negative for odynophagia, dysphagia, nausea, vomiting, melena, hematochezia   Review of Systems General: Denies fevers/chills/weight loss unintentionally HEENT: Denies oral lesions Cardiovascular: Denies chest pain Pulmonary: Denies shortness of breath Gastroenterological: See HPI Genitourinary: Denies darkened urine Hematological: Positive for easy bruising/bleeding Dermatological: Denies jaundice Psychological: Mood is stable and much improved from previous   Medications Current Outpatient Medications  Medication Sig Dispense Refill  . acidophilus (RISAQUAD) CAPS capsule Take 1 capsule by mouth daily.    . Adalimumab (HUMIRA PEN) 40 MG/0.4ML PNKT Inject 40 mg into the skin every 14 (fourteen) days. 2 each 11  . ALPRAZolam (XANAX) 0.5 MG tablet Take 0.5 mg by mouth at bedtime as needed for sleep.    Marland Kitchen amitriptyline (ELAVIL) 10 MG tablet 10 mg.    . ANECREAM5 5 % CREA APPLY TO ANUS EVERY 8 HOURS AS NEEDED FOR PAIN 30 g 1  . azaTHIOprine (IMURAN) 50 MG tablet Take 1 tablet (50 mg total) by mouth daily. 90 tablet 3  . ferrous gluconate (FERGON) 324 MG tablet Take 1 tablet (324 mg total) by mouth daily with breakfast. 30 tablet 3  . HYDROCORTISONE ACE, RECTAL, 30 MG SUPP Place 1 suppository (30 mg total) rectally in the morning and at bedtime. 12 suppository 3  . levothyroxine (SYNTHROID, LEVOTHROID) 25 MCG tablet Take 25 mcg by mouth daily before breakfast.     . mesalamine (LIALDA) 1.2 g EC tablet TAKE 4  TABLETS BY MOUTH DAILY WITH BREAKFAST. 120 tablet 1  . ondansetron (ZOFRAN ODT) 8 MG disintegrating tablet Take 1 tablet (8 mg total) by mouth every 8 (eight) hours as needed for nausea or vomiting. 20 tablet 0  . pantoprazole (PROTONIX) 40 MG tablet Take 40 mg by mouth at bedtime.     . predniSONE (DELTASONE) 20 MG tablet Take 1 tablet (20 mg total) by mouth daily. 90 tablet 0  . traZODone (DESYREL)  50 MG tablet Take 50 mg by mouth at bedtime as needed for sleep.     . predniSONE (DELTASONE) 5 MG tablet Start Taper - 03/27/20- 21m daily thru- 04/20/20-10 mg daily, thur- 04/24/20- 598mdaily, thru- 05/08/20-0 100 tablet 0   No current facility-administered medications for this visit.    Allergies Allergies  Allergen Reactions  . Tramadol     Says "blew my head up."    Histories Past Medical History:  Diagnosis Date  . Anal fissure   . Anxiety   . Anxiety disorder, unspecified   . Essential (primary) hypertension   . Gastro-esophageal reflux disease with esophagitis   . GERD (gastroesophageal reflux disease)   . Hypokalemia   . Hypothyroidism   . Noninfective gastroenteritis and colitis, unspecified   . Sleep apnea    Past Surgical History:  Procedure Laterality Date  . BIOPSY  09/02/2018   Procedure: BIOPSY;  Surgeon: RoDaneil DolinMD;  Location: [facility];  Service: Endoscopy;;  colon   . CHOLECYSTECTOMY  05/19/2011   Procedure: LAPAROSCOPIC CHOLECYSTECTOMY;  Surgeon: MaJamesetta So Location: [facility];  Service: General;  Laterality: N/A;  . COLONOSCOPY WITH PROPOFOL N/A 09/02/2018   Dr. RoGala RomneyProctocolitis limited to the left side with some exudate versus partial pseudomembrane formation, erosions and ulceration somewhat superficial.  Terminal ileum normal.  Diverticulosis.  Descending/sigmoid colon biopsy showing severe active colitis, diagnostic features of IBD not identified.  Differential includes infection, ischemia.  Rectal biopsies with granulation tissue c/w ulcer  . CYSTOSCOPY W/ URETERAL STENT PLACEMENT Right 05/08/2019   Procedure: CYSTOSCOPY WITH RETROGRADE PYELOGRAM/URETERAL STENT PLACEMENT;  Surgeon: EsFestus AloeMD;  Location: [facility];  Service: Urology;  Laterality: Right;  . CYSTOSCOPY WITH URETEROSCOPY Right 06/11/2019   Procedure: CYSTOSCOPY WITH DIAGNOSTIC URETEROSCOPY; URETERAL STENT REMOVAL;  Surgeon: McCleon GustinMD;  Location: [facility];   Service: Urology;  Laterality: Right;  . HEMORRHOID SURGERY N/A 09/16/2019   Procedure: EXTENSIVE HEMORRHOIDECTOMY;  Surgeon: JeAviva SignsMD;  Location: [facility];  Service: General;  Laterality: N/A;  . RECTAL BIOPSY N/A 09/16/2019   Procedure: BIOPSY RECTAL;  Surgeon: JeAviva SignsMD;  Location: [facility];  Service: General;  Laterality: N/A;  . TUBAL LIGATION     Social History   Socioeconomic History  . Marital status: Divorced    Spouse name: Not on file  . Number of children: 2  . Years of education: Not on file  . Highest education level: Not on file  Occupational History  . Occupation: retired  Tobacco Use  . Smoking status: Never Smoker  . Smokeless tobacco: Never Used  Vaping Use  . Vaping Use: Never used  Substance and Sexual Activity  . Alcohol use: Not Currently    Comment: once a year  . Drug use: No  . Sexual activity: Not Currently  Other Topics Concern  . Not on file  Social History Narrative  . Not on file   Social Determinants of Health   Financial Resource Strain: Low Risk   .  Difficulty of Paying Living Expenses: Not hard at all  Food Insecurity: No Food Insecurity  . Worried About Charity fundraiser in the Last Year: Never true  . Ran Out of Food in the Last Year: Never true  Transportation Needs: No Transportation Needs  . Lack of Transportation (Medical): No  . Lack of Transportation (Non-Medical): No  Physical Activity: Sufficiently Active  . Days of Exercise per Week: 5 days  . Minutes of Exercise per Session: 30 min  Stress: No Stress Concern Present  . Feeling of Stress : Not at all  Social Connections: Socially Isolated  . Frequency of Communication with Friends and Family: More than three times a week  . Frequency of Social Gatherings with Friends and Family: More than three times a week  . Attends Religious Services: Never  . Active Member of Clubs or Organizations: No  . Attends Archivist Meetings: Never  . Marital Status:  Divorced  Human resources officer Violence: Not At Risk  . Fear of Current or Ex-Partner: No  . Emotionally Abused: No  . Physically Abused: No  . Sexually Abused: No   Family History  Problem Relation Age of Onset  . Cancer Father        mouth  . Heart disease Sister   . Heart disease Brother   . Cancer Brother        head  . Cancer Daughter        ? type  . Anesthesia problems Neg Hx   . Hypotension Neg Hx   . Malignant hyperthermia Neg Hx   . Pseudochol deficiency Neg Hx   . Colon cancer Neg Hx   . Esophageal cancer Neg Hx   . Inflammatory bowel disease Neg Hx   . Liver disease Neg Hx   . Pancreatic cancer Neg Hx   . Rectal cancer Neg Hx   . Stomach cancer Neg Hx    I have reviewed her medical, social, and family history in detail and updated the electronic medical record as necessary.    PHYSICAL EXAMINATION  BP (!) 180/70 (BP Location: Left Arm, Patient Position: Sitting, Cuff Size: Normal)   Pulse 80   Ht 5' 5.5" (1.664 m) Comment: height measured without again without shoes  Wt 168 lb 4 oz (76.3 kg)   BMI 27.57 kg/m  Wt Readings from Last 3 Encounters:  03/12/20 168 lb 4 oz (76.3 kg)  12/11/19 157 lb (71.2 kg)  12/04/19 161 lb (73 kg)  GEN: No acute distress, nontoxic appearing, accompanied by sister, able to sit much improved from her prior visit PSYCH: Cooperative, without pressured speech EYE: Conjunctivae pink, sclerae anicteric ENT: MMM, without oral ulcers CV: Nontachycardic RESP: No audible wheezing GI: NABS, soft, mildly protuberant, nontender, no rebound or guarding MSK/EXT: No significant lower extremity edema SKIN: No jaundice NEURO:  Alert & Oriented x 3, no focal deficits   REVIEW OF DATA  I reviewed the following data at the time of this encounter:  GI Procedures and Studies  Previously reviewed  Laboratory Studies  Reviewed those in epic that I have recently obtained  Imaging Studies  No new imaging studies to review   ASSESSMENT    Ms. Bartz is a 74 y.o. female with a pmh significant for hypertension, anxiety, hypothyroidism, sleep apnea, iron deficiency anemia, nephrolithiasis, hemorrhoids (status post resection), GERD, likely new diagnosis of IBD (most likely Crohn's colitis versus pan ulcerative colitis).  The patient is seen today for evaluation and management  of:  1. IBD (inflammatory bowel disease)   2. Long term current use of immunosuppressive drug   3. Rectal bleeding    The patient is hemodynamically stable and clinically much improved from previous evaluation.  She has completed her induction therapy with Humira.  She remains on mesalamine therapy.  She is also on low-dose Imuran in effort of trying to minimize antibody formation to Humira.  I would like the patient to return in a few weeks prior to her injection for a clinic visit in an effort of doing some therapeutic drug monitoring to ensure that she is adequately dosed and that there is no evidence of antibody response.  We will work on decreasing her prednisone over the course of the coming weeks with a taper as outlined below.  I believe the patient likely has Crohn's colitis more than pan ulcerative atypical colitis but certainly we are treating it as either at this time.  At some point we may come off mesalamine but for now we are going to keep it.  Laboratories are pending from being obtained this morning to ensure no issues from her medications.  I will see her back in a few weeks but if so happy that she is doing better.  All patient questions were answered to the best of my ability, and the patient agrees to the aforementioned plan of action with follow-up as indicated.   PLAN  Awaiting laboratories from this morning that were drawn Continue Humira 40 mg every 2 weeks Continue azathioprine 50 mg daily for now and may consider up titration in future prednisone taper -20 mg daily until 8/21  -15 mg daily until 9/4  -10 mg daily until 9/18  -5 mg daily  until 10/2  -Stop prednisone  Humira antibody and Humira drug monitoring to be obtained at patient's next clinic visit next month Repeat colonoscopy within 6 to 12 months since last   Orders Placed This Encounter  Procedures  . Calprotectin, Fecal  . CBC  . Hepatic function panel  . Sedimentation rate  . CRP High sensitivity    New Prescriptions   PREDNISONE (DELTASONE) 5 MG TABLET    Start Taper - 03/27/20- 54m daily thru- 04/20/20-10 mg daily, thur- 04/24/20- 549mdaily, thru- 05/08/20-0   Modified Medications   No medications on file    Planned Follow Up No follow-ups on file.   Total Time in Face-to-Face and in Coordination of Care for patient including independent/personal interpretation/review of prior testing, medical history, examination, medication adjustment, communicating results with the patient directly, and documentation with the EHR is more than 30 minutes.   GaJustice BritainMD [facility] Office # [phone]

## 2020-03-12 NOTE — Patient Instructions (Addendum)
We have sent the following medications to your pharmacy for you to pick up at your convenience: Prednisone - see instruction on Tapering   8/21 -40m total daily (3 tabs) 9/4- 124mtotal daily(2 tabs) 9/18-63m67motal daily(1 tab) 10/2- none(0 tab)  Your provider has requested that you go to the basement level for lab work before leaving today. Press "B" on the elevator. The lab is located at the first door on the left as you exit the elevator.  Please keep follow-up in Sept. 04/23/20 @ 9:30am  If you are age 39 16 older, your body mass index should be between 23-30. Your Body mass index is 27.57 kg/m. If this is out of the aforementioned range listed, please consider follow up with your Primary Care Provider.  If you are age 42 3 younger, your body mass index should be between 19-25. Your Body mass index is 27.57 kg/m. If this is out of the aformentioned range listed, please consider follow up with your Primary Care Provider.   Thank you for choosing me and [facility].  Dr. ManRush Landmark

## 2020-03-15 ENCOUNTER — Encounter: Payer: Self-pay | Admitting: Gastroenterology

## 2020-03-15 ENCOUNTER — Other Ambulatory Visit: Payer: Self-pay

## 2020-03-15 ENCOUNTER — Other Ambulatory Visit: Payer: Medicare HMO

## 2020-03-15 DIAGNOSIS — Z7960 Long term (current) use of unspecified immunomodulators and immunosuppressants: Secondary | ICD-10-CM | POA: Insufficient documentation

## 2020-03-15 DIAGNOSIS — K529 Noninfective gastroenteritis and colitis, unspecified: Secondary | ICD-10-CM

## 2020-03-15 DIAGNOSIS — Z79899 Other long term (current) drug therapy: Secondary | ICD-10-CM | POA: Diagnosis not present

## 2020-03-15 DIAGNOSIS — D509 Iron deficiency anemia, unspecified: Secondary | ICD-10-CM

## 2020-03-15 DIAGNOSIS — K50111 Crohn's disease of large intestine with rectal bleeding: Secondary | ICD-10-CM

## 2020-03-17 LAB — CALPROTECTIN, FECAL: Calprotectin, Fecal: 439 ug/g — ABNORMAL HIGH (ref 0–120)

## 2020-03-25 ENCOUNTER — Other Ambulatory Visit: Payer: Self-pay

## 2020-03-25 DIAGNOSIS — K529 Noninfective gastroenteritis and colitis, unspecified: Secondary | ICD-10-CM

## 2020-03-25 DIAGNOSIS — Z79899 Other long term (current) drug therapy: Secondary | ICD-10-CM

## 2020-03-29 ENCOUNTER — Other Ambulatory Visit: Payer: Self-pay | Admitting: Gastroenterology

## 2020-03-30 ENCOUNTER — Other Ambulatory Visit: Payer: Self-pay | Admitting: Gastroenterology

## 2020-04-08 ENCOUNTER — Other Ambulatory Visit: Payer: Self-pay | Admitting: Gastroenterology

## 2020-04-20 ENCOUNTER — Other Ambulatory Visit: Payer: Self-pay | Admitting: Gastroenterology

## 2020-04-21 ENCOUNTER — Telehealth: Payer: Self-pay | Admitting: Gastroenterology

## 2020-04-21 NOTE — Telephone Encounter (Signed)
Patient called states she has a pinched nerve and would like to get advise prior to her upcoming appt and see if she in fact needs to come in

## 2020-04-21 NOTE — Telephone Encounter (Signed)
The pt states she has a pinched nerve in her back.  She has an appt for 9/17 and would like to know if she needs to keep her upcoming appt since she has some pain in her back with the pinched nerve.  She has questions about Humira and symptoms.  I advised her to try and keep the appt as planned due to her concerns.  The pt has been advised of the information and verbalized understanding.

## 2020-04-23 ENCOUNTER — Other Ambulatory Visit: Payer: Self-pay

## 2020-04-23 ENCOUNTER — Ambulatory Visit (INDEPENDENT_AMBULATORY_CARE_PROVIDER_SITE_OTHER)
Admission: RE | Admit: 2020-04-23 | Discharge: 2020-04-23 | Disposition: A | Discharge status: Home / Self Care | Payer: Medicare HMO | Source: Ambulatory Visit | Attending: Gastroenterology | Admitting: Gastroenterology

## 2020-04-23 ENCOUNTER — Other Ambulatory Visit (INDEPENDENT_AMBULATORY_CARE_PROVIDER_SITE_OTHER): Payer: Medicare HMO

## 2020-04-23 ENCOUNTER — Ambulatory Visit: Payer: Medicare HMO | Admitting: Gastroenterology

## 2020-04-23 ENCOUNTER — Encounter: Payer: Self-pay | Admitting: Gastroenterology

## 2020-04-23 VITALS — BP 154/80 | HR 88 | Ht 65.5 in | Wt 167.4 lb

## 2020-04-23 DIAGNOSIS — K529 Noninfective gastroenteritis and colitis, unspecified: Secondary | ICD-10-CM | POA: Diagnosis not present

## 2020-04-23 DIAGNOSIS — M545 Low back pain, unspecified: Secondary | ICD-10-CM | POA: Insufficient documentation

## 2020-04-23 DIAGNOSIS — D84821 Adverse effect of glucocorticoids and synthetic analogues, initial encounter: Secondary | ICD-10-CM

## 2020-04-23 DIAGNOSIS — Z7952 Long term (current) use of systemic steroids: Secondary | ICD-10-CM

## 2020-04-23 DIAGNOSIS — D509 Iron deficiency anemia, unspecified: Secondary | ICD-10-CM

## 2020-04-23 DIAGNOSIS — T380X5A Adverse effect of glucocorticoids and synthetic analogues, initial encounter: Secondary | ICD-10-CM

## 2020-04-23 DIAGNOSIS — S32010A Wedge compression fracture of first lumbar vertebra, initial encounter for closed fracture: Secondary | ICD-10-CM | POA: Diagnosis not present

## 2020-04-23 DIAGNOSIS — D849 Immunodeficiency, unspecified: Secondary | ICD-10-CM | POA: Insufficient documentation

## 2020-04-23 DIAGNOSIS — R109 Unspecified abdominal pain: Secondary | ICD-10-CM | POA: Insufficient documentation

## 2020-04-23 DIAGNOSIS — I878 Other specified disorders of veins: Secondary | ICD-10-CM | POA: Diagnosis not present

## 2020-04-23 DIAGNOSIS — M47816 Spondylosis without myelopathy or radiculopathy, lumbar region: Secondary | ICD-10-CM | POA: Diagnosis not present

## 2020-04-23 LAB — CBC WITH DIFFERENTIAL/PLATELET
Basophils Absolute: 0.1 10*3/uL (ref 0.0–0.1)
Basophils Relative: 0.6 % (ref 0.0–3.0)
Eosinophils Absolute: 0 10*3/uL (ref 0.0–0.7)
Eosinophils Relative: 0.2 % (ref 0.0–5.0)
HCT: 38.2 % (ref 36.0–46.0)
Hemoglobin: 11.7 g/dL — ABNORMAL LOW (ref 12.0–15.0)
Lymphocytes Relative: 9 % — ABNORMAL LOW (ref 12.0–46.0)
Lymphs Abs: 1.1 10*3/uL (ref 0.7–4.0)
MCHC: 30.6 g/dL (ref 30.0–36.0)
MCV: 73.6 fl — ABNORMAL LOW (ref 78.0–100.0)
Monocytes Absolute: 0.2 10*3/uL (ref 0.1–1.0)
Monocytes Relative: 1.6 % — ABNORMAL LOW (ref 3.0–12.0)
Neutro Abs: 10.5 10*3/uL — ABNORMAL HIGH (ref 1.4–7.7)
Neutrophils Relative %: 88.6 % — ABNORMAL HIGH (ref 43.0–77.0)
Platelets: 471 10*3/uL — ABNORMAL HIGH (ref 150.0–400.0)
RBC: 5.2 Mil/uL — ABNORMAL HIGH (ref 3.87–5.11)
RDW: 19.8 % — ABNORMAL HIGH (ref 11.5–15.5)
WBC: 11.8 10*3/uL — ABNORMAL HIGH (ref 4.0–10.5)

## 2020-04-23 LAB — COMPREHENSIVE METABOLIC PANEL
ALT: 8 U/L (ref 0–35)
AST: 10 U/L (ref 0–37)
Albumin: 4.3 g/dL (ref 3.5–5.2)
Alkaline Phosphatase: 101 U/L (ref 39–117)
BUN: 25 mg/dL — ABNORMAL HIGH (ref 6–23)
CO2: 26 mEq/L (ref 19–32)
Calcium: 9.6 mg/dL (ref 8.4–10.5)
Chloride: 107 mEq/L (ref 96–112)
Creatinine, Ser: 0.97 mg/dL (ref 0.40–1.20)
GFR: 56.05 mL/min — ABNORMAL LOW (ref >60.00)
Glucose, Bld: 130 mg/dL — ABNORMAL HIGH (ref 70–99)
Potassium: 4.2 mEq/L (ref 3.5–5.1)
Sodium: 142 mEq/L (ref 135–145)
Total Bilirubin: 0.4 mg/dL (ref 0.2–1.2)
Total Protein: 7.6 g/dL (ref 6.0–8.3)

## 2020-04-23 LAB — URINALYSIS, ROUTINE W REFLEX MICROSCOPIC
Bilirubin Urine: NEGATIVE
Ketones, ur: NEGATIVE
Nitrite: NEGATIVE
Specific Gravity, Urine: 1.025 (ref 1.000–1.030)
Total Protein, Urine: NEGATIVE
Urine Glucose: NEGATIVE
Urobilinogen, UA: 0.2 (ref 0.0–1.0)
pH: 5.5 (ref 5.0–8.0)

## 2020-04-23 LAB — IBC + FERRITIN
Ferritin: 31.4 ng/mL (ref 10.0–291.0)
Iron: 3 ug/dL — ABNORMAL LOW (ref 42–145)
Saturation Ratios: 0.8 % — ABNORMAL LOW (ref 20.0–50.0)
Transferrin: 279 mg/dL (ref 212.0–360.0)

## 2020-04-23 LAB — HIGH SENSITIVITY CRP: CRP, High Sensitivity: 24.37 mg/L — ABNORMAL HIGH (ref 0.000–5.000)

## 2020-04-23 LAB — SEDIMENTATION RATE: Sed Rate: 46 mm/hr — ABNORMAL HIGH (ref 0–30)

## 2020-04-23 NOTE — Patient Instructions (Signed)
Your provider has requested that you go to the basement level for lab work before leaving today. Press "B" on the elevator. The lab is located at the first door on the left as you exit the elevator.  Your provider has requested that you have an back xray before leaving today. Please go to the basement floor to our Radiology department for the test.  Due to recent changes in healthcare laws, you may see the results of your imaging and laboratory studies on MyChart before your provider has had a chance to review them.  We understand that in some cases there may be results that are confusing or concerning to you. Not all laboratory results come back in the same time frame and the provider may be waiting for multiple results in order to interpret others.  Please give [place] 48 hours in order for your provider to thoroughly review all the results before contacting the office for clarification of your results.   If you are age 32 or older, your body mass index should be between 23-30. Your Body mass index is 27.43 kg/m. If this is out of the aforementioned range listed, please consider follow up with your Primary Care Provider.  If you are age 40 or younger, your body mass index should be between 19-25. Your Body mass index is 27.43 kg/m. If this is out of the aformentioned range listed, please consider follow up with your Primary Care Provider.    Thank you for choosing me and [facility].  Dr. [name]

## 2020-04-23 NOTE — Progress Notes (Signed)
[facility] VISIT   Primary Care Provider [address], [place], [place] [address] [place] [address] [phone]  Patient Profile: Rhonda Bailey is a 74 y.o. female with a pmh significant for hypertension, anxiety, hypothyroidism, sleep apnea, iron deficiency anemia, nephrolithiasis, hemorrhoids (status post resection), GERD, likely new diagnosis of IBD (most likely Crohn's colitis versus pan ulcerative colitis).  The patient presents to the [facility] for an evaluation and management of problem(s) noted below:  Problem List 1. IBD (inflammatory bowel disease)   2. Right flank pain   3. Acute right-sided low back pain without sciatica   4. Iron deficiency anemia, unspecified iron deficiency anemia type   5. Immunosuppression ([place])     History of Present Illness Please see initial inpatient consultation note as well my prior progress notes for full details of HPI.  Interval History The patient returns for scheduled follow-up.  She is accompanied by her sister.  Patient has remained on Humira.  She is doing this every 2 weeks.  Her last Humira injection was last Saturday.  Patient continues to have improvement in regards to her bowel habits.  She is however concerned about having too many medications at this time.  She is concerned about her Lialda.  She is also concerned about her oral iron.  She is concerned that the oral iron causes her to have urgency at times.  Overall however the patient has had significant improvement.  She is currently on 10 mg of oral prednisone with plan for that to continue through the rest of this week and starting next week down to 5 mg daily.  She has not had significant lower extremity edema.  She has developed some right-sided flank/back pain.  She is worried that she may have a potential kidney stone or bladder infection.  She denies dysuria however.  There has been no hematuria.  She continues to  take Imuran without issue.    BMs per day -2 times daily Nocturnal BMs -none Blood -none Mucous -none Tenesmus -none Urgency -still has some but improving Skin Manifestations -no new issues Eye Manifestations -no new issues Joint Manifestations -no new issues  GI Review of Systems Positive as above Negative for pyrosis, dysphagia, nausea, vomiting, melena, hematochezia, abdominal pain  Review of Systems General: Denies fevers/chills/weight loss unintentionally HEENT: Denies oral lesions  Cardiovascular: Denies chest pain Pulmonary: Denies shortness of breath Gastroenterological: See HPI Genitourinary: Denies darkened urine Hematological: Positive for easy bruising/bleeding which is longstanding Dermatological: Denies jaundice Psychological: Mood is stable though she has concern as to why she is having some a symptoms in her back and right flank   Medications Current Outpatient Medications  Medication Sig Dispense Refill  . acidophilus (RISAQUAD) CAPS capsule Take 1 capsule by mouth daily.    . Adalimumab (HUMIRA PEN) 40 MG/0.4ML PNKT Inject 40 mg into the skin every 14 (fourteen) days. 2 each 11  . ALPRAZolam (XANAX) 0.5 MG tablet Take 0.5 mg by mouth at bedtime as needed for sleep.    Marland Kitchen amitriptyline (ELAVIL) 10 MG tablet 10 mg.    . ANECREAM5 5 % CREA APPLY TO ANUS EVERY 8 HOURS AS NEEDED FOR PAIN 30 g 1  . azaTHIOprine (IMURAN) 50 MG tablet Take 1 tablet (50 mg total) by mouth daily. 90 tablet 3  . ferrous gluconate (FERGON) 324 MG tablet TAKE 1 TABLET BY MOUTH DAILY WITH BREAKFAST 90 tablet 1  . HYDROCORTISONE ACE, RECTAL, 30 MG SUPP Place 1  suppository (30 mg total) rectally in the morning and at bedtime. 12 suppository 3  . levothyroxine (SYNTHROID, LEVOTHROID) 25 MCG tablet Take 25 mcg by mouth daily before breakfast.     . mesalamine (LIALDA) 1.2 g EC tablet TAKE 4 TABLETS BY MOUTH DAILY WITH BREAKFAST. 120 tablet 2  . ondansetron (ZOFRAN ODT) 8 MG disintegrating  tablet Take 1 tablet (8 mg total) by mouth every 8 (eight) hours as needed for nausea or vomiting. 20 tablet 0  . pantoprazole (PROTONIX) 40 MG tablet Take 40 mg by mouth at bedtime.     . predniSONE (DELTASONE) 5 MG tablet Start Taper - 03/27/20- 27m daily thru- 04/20/20-10 mg daily, thur- 04/24/20- 535mdaily, thru- 05/08/20-0 100 tablet 0  . traZODone (DESYREL) 50 MG tablet Take 50 mg by mouth at bedtime as needed for sleep.      No current facility-administered medications for this visit.    Allergies Allergies  Allergen Reactions  . Tramadol     Says "blew my head up."    Histories Past Medical History:  Diagnosis Date  . Anal fissure   . Anxiety   . Anxiety disorder, unspecified   . Essential (primary) hypertension   . Gastro-esophageal reflux disease with esophagitis   . GERD (gastroesophageal reflux disease)   . Hypokalemia   . Hypothyroidism   . Noninfective gastroenteritis and colitis, unspecified   . Sleep apnea    Past Surgical History:  Procedure Laterality Date  . BIOPSY  09/02/2018   Procedure: BIOPSY;  Surgeon: RoDaneil DolinMD;  Location: [facility];  Service: Endoscopy;;  colon   . CHOLECYSTECTOMY  05/19/2011   Procedure: LAPAROSCOPIC CHOLECYSTECTOMY;  Surgeon: MaJamesetta So Location: [facility] ORS;  Service: General;  Laterality: N/A;  . COLONOSCOPY WITH PROPOFOL N/A 09/02/2018   Dr. RoGala RomneyProctocolitis limited to the left side with some exudate versus partial pseudomembrane formation, erosions and ulceration somewhat superficial.  Terminal ileum normal.  Diverticulosis.  Descending/sigmoid colon biopsy showing severe active colitis, diagnostic features of IBD not identified.  Differential includes infection, ischemia.  Rectal biopsies with granulation tissue c/w ulcer  . CYSTOSCOPY W/ URETERAL STENT PLACEMENT Right 05/08/2019   Procedure: CYSTOSCOPY WITH RETROGRADE PYELOGRAM/URETERAL STENT PLACEMENT;  Surgeon: EsFestus AloeMD;  Location: [facility];  Service:  Urology;  Laterality: Right;  . CYSTOSCOPY WITH URETEROSCOPY Right 06/11/2019   Procedure: CYSTOSCOPY WITH DIAGNOSTIC URETEROSCOPY; URETERAL STENT REMOVAL;  Surgeon: McCleon GustinMD;  Location: [facility];  Service: Urology;  Laterality: Right;  . HEMORRHOID SURGERY N/A 09/16/2019   Procedure: EXTENSIVE HEMORRHOIDECTOMY;  Surgeon: JeAviva SignsMD;  Location: [facility];  Service: General;  Laterality: N/A;  . RECTAL BIOPSY N/A 09/16/2019   Procedure: BIOPSY RECTAL;  Surgeon: JeAviva SignsMD;  Location: [facility];  Service: General;  Laterality: N/A;  . TUBAL LIGATION     Social History   Socioeconomic History  . Marital status: Divorced    Spouse name: Not on file  . Number of children: 2  . Years of education: Not on file  . Highest education level: Not on file  Occupational History  . Occupation: retired  Tobacco Use  . Smoking status: Never Smoker  . Smokeless tobacco: Never Used  Vaping Use  . Vaping Use: Never used  Substance and Sexual Activity  . Alcohol use: Not Currently    Comment: once a year  . Drug use: No  . Sexual activity: Not Currently  Other Topics Concern  . Not  on file  Social History Narrative  . Not on file   Social Determinants of Health   Financial Resource Strain: Low Risk   . Difficulty of Paying Living Expenses: Not hard at all  Food Insecurity: No Food Insecurity  . Worried About Charity fundraiser in the Last Year: Never true  . Ran Out of Food in the Last Year: Never true  Transportation Needs: No Transportation Needs  . Lack of Transportation (Medical): No  . Lack of Transportation (Non-Medical): No  Physical Activity: Sufficiently Active  . Days of Exercise per Week: 5 days  . Minutes of Exercise per Session: 30 min  Stress: No Stress Concern Present  . Feeling of Stress : Not at all  Social Connections: Socially Isolated  . Frequency of Communication with Friends and Family: More than three times a week  . Frequency of Social Gatherings  with Friends and Family: More than three times a week  . Attends Religious Services: Never  . Active Member of Clubs or Organizations: No  . Attends Archivist Meetings: Never  . Marital Status: Divorced  Human resources officer Violence: Not At Risk  . Fear of Current or Ex-Partner: No  . Emotionally Abused: No  . Physically Abused: No  . Sexually Abused: No   Family History  Problem Relation Age of Onset  . Cancer Father        mouth  . Heart disease Sister   . Heart disease Brother   . Cancer Brother        head  . Cancer Daughter        ? type  . Anesthesia problems Neg Hx   . Hypotension Neg Hx   . Malignant hyperthermia Neg Hx   . Pseudochol deficiency Neg Hx   . Colon cancer Neg Hx   . Esophageal cancer Neg Hx   . Inflammatory bowel disease Neg Hx   . Liver disease Neg Hx   . Pancreatic cancer Neg Hx   . Rectal cancer Neg Hx   . Stomach cancer Neg Hx    I have reviewed her medical, social, and family history in detail and updated the electronic medical record as necessary.    PHYSICAL EXAMINATION  BP (!) 154/80 (BP Location: Left Arm, Patient Position: Sitting, Cuff Size: Normal)   Pulse 88   Ht 5' 5.5" (1.664 m)   Wt 167 lb 6 oz (75.9 kg)   BMI 27.43 kg/m  Wt Readings from Last 3 Encounters:  04/23/20 167 lb 6 oz (75.9 kg)  03/12/20 168 lb 4 oz (76.3 kg)  12/11/19 157 lb (71.2 kg)  GEN: No acute distress, nontoxic appearing, accompanied by sister, able to get up to the examination table without significant issue/discomfort PSYCH: Cooperative, without pressured speech EYE: Conjunctivae pink, sclerae anicteric ENT: MMM, without oral ulcers CV: Nontachycardic RESP: No audible wheezing GI: NABS, soft, mildly protuberant, nontender, no rebound or guarding MSK/EXT: Discomfort in the right CVA region as well as right flank but not going into the groin, negative right and left leg raise test SKIN: No jaundice NEURO:  Alert & Oriented x 3, no focal  deficits   REVIEW OF DATA  I reviewed the following data at the time of this encounter:  GI Procedures and Studies  Previously reviewed  Laboratory Studies  Reviewed those in epic  Imaging Studies  No new imaging studies to review   ASSESSMENT  Ms. Mikus is a 74 y.o. female with a pmh significant  for hypertension, anxiety, hypothyroidism, sleep apnea, iron deficiency anemia, nephrolithiasis, hemorrhoids (status post resection), GERD, likely new diagnosis of IBD (most likely Crohn's colitis versus pan ulcerative colitis).  The patient is seen today for evaluation and management of:  1. IBD (inflammatory bowel disease)   2. Right flank pain   3. Acute right-sided low back pain without sciatica   4. Iron deficiency anemia, unspecified iron deficiency anemia type   5. Immunosuppression ([place])    The patient is hemodynamically stable.  Clinically, she continues to have improvement in regards to her GI symptoms.  I do think the Humira has been very helpful for the patient.  Unfortunately today is not the day for [place] to do adalimumab therapeutic drug monitoring and that will have to be done next week prior to her next Humira dosing.  Patient has concerns about continued high-dose Lialda therapy and I am okay to go to 2.4 g daily but I would not try to go less than that for now or stop it.  She will continue current azathioprine dosing for 8 and trying to minimize development of Humira antibodies.  She will undergo maintenance labs to ensure she is not having any significant changes in her cell counts and electrolytes.  It is not overtly convincing to me that the patient has an underlying urinary tract infection or kidney stone but we will obtain a urinalysis to try and rule out pneumaturia and/or underlying bacterial infection of the urine.  I have some concern about the potential for a compression fracture in the setting of her longstanding steroid use even though we have been decreasing the dose in  a safe slow manner.  We will proceed with obtaining x-rays of the low back and abdomen.  She will continue current Humira dosing.  We will see her back in follow-up.  All patient questions were answered to the best of my ability, and the patient agrees to the aforementioned plan of action with follow-up as indicated.    PLAN  Laboratories as outlined below Continue Humira 40 mg every 2 weeks -Next week come in for Humira antibody level and Humira drug level on Friday (day before her next injection) Continue azathioprine 50 mg daily for now and may consider up titration in future as we near the end of the prednisone taper -10 mg daily until 9/18  -5 mg daily until 10/2  -Stop prednisone  May decrease Lialda to 2.4 g daily Repeat colonoscopy within 6 to 12 months since last Urinalysis with reflex urine culture if issues Iron indices to be obtained Maintain oral iron for now but consideration of IV iron based on what iron indices show will need to be had X-rays to rule out compression fractures   Orders Placed This Encounter  Procedures  . DG Abd 2 Views  . DG Lumbar Spine 2-3 Views  . CBC w/Diff  . Comp Met (CMET)  . Sedimentation rate  . CRP High sensitivity  . IBC + Ferritin  . Urinalysis, Routine w reflex microscopic    New Prescriptions   No medications on file   Modified Medications   No medications on file    Planned Follow Up No follow-ups on file.   Total Time in Face-to-Face and in Coordination of Care for patient including independent/personal interpretation/review of prior testing, medical history, examination, medication adjustment, communicating results with the patient directly, and documentation with the EHR is more than 30 minutes.   Justice Britain, MD [facility] Office #  [phone]

## 2020-04-26 ENCOUNTER — Other Ambulatory Visit: Payer: Self-pay

## 2020-04-26 DIAGNOSIS — D509 Iron deficiency anemia, unspecified: Secondary | ICD-10-CM

## 2020-04-29 ENCOUNTER — Telehealth: Payer: Self-pay | Admitting: Gastroenterology

## 2020-04-29 DIAGNOSIS — K529 Noninfective gastroenteritis and colitis, unspecified: Secondary | ICD-10-CM

## 2020-04-29 NOTE — Telephone Encounter (Signed)
Starting 2 days ago the pt began to have pretty significant abd cramping.  She also says her stools have changed to a "yellowy" color and stringy appearance.  Her stools are very loose but she is only going 1-2 times daily. She states that they are "very odorous"  She is taking humira every 14 days, imuran 50 mg daily, lialda 4.8 mg daily, and prednisone 5 mg daily.  Would you like stool studies?

## 2020-04-29 NOTE — Telephone Encounter (Signed)
Stool studies (Culture and C. Difficile) are reasonable. She should have already decreased to Lialda 2.4 g daily. Continue current Prednisone dosing. Dicyclomine 2-4x daily can be considered for cramping. Thank you. GM

## 2020-04-29 NOTE — Telephone Encounter (Signed)
Patient states she is having unbearable abdominal cramping and is seeking advise

## 2020-04-30 ENCOUNTER — Other Ambulatory Visit (INDEPENDENT_AMBULATORY_CARE_PROVIDER_SITE_OTHER): Payer: Medicare HMO

## 2020-04-30 DIAGNOSIS — K529 Noninfective gastroenteritis and colitis, unspecified: Secondary | ICD-10-CM

## 2020-04-30 DIAGNOSIS — D509 Iron deficiency anemia, unspecified: Secondary | ICD-10-CM | POA: Diagnosis not present

## 2020-04-30 LAB — CBC WITH DIFFERENTIAL/PLATELET
Basophils Absolute: 0 10*3/uL (ref 0.0–0.1)
Basophils Relative: 0.3 % (ref 0.0–3.0)
Eosinophils Absolute: 0.1 10*3/uL (ref 0.0–0.7)
Eosinophils Relative: 1 % (ref 0.0–5.0)
HCT: 36.8 % (ref 36.0–46.0)
Hemoglobin: 11.3 g/dL — ABNORMAL LOW (ref 12.0–15.0)
Lymphocytes Relative: 16.3 % (ref 12.0–46.0)
Lymphs Abs: 1.9 10*3/uL (ref 0.7–4.0)
MCHC: 30.7 g/dL (ref 30.0–36.0)
MCV: 73.5 fl — ABNORMAL LOW (ref 78.0–100.0)
Monocytes Absolute: 0.8 10*3/uL (ref 0.1–1.0)
Monocytes Relative: 6.6 % (ref 3.0–12.0)
Neutro Abs: 8.8 10*3/uL — ABNORMAL HIGH (ref 1.4–7.7)
Neutrophils Relative %: 75.8 % (ref 43.0–77.0)
Platelets: 440 10*3/uL — ABNORMAL HIGH (ref 150.0–400.0)
RBC: 5.01 Mil/uL (ref 3.87–5.11)
RDW: 19.8 % — ABNORMAL HIGH (ref 11.5–15.5)
WBC: 11.6 10*3/uL — ABNORMAL HIGH (ref 4.0–10.5)

## 2020-04-30 LAB — COMPREHENSIVE METABOLIC PANEL
ALT: 9 U/L (ref 0–35)
AST: 11 U/L (ref 0–37)
Albumin: 4.1 g/dL (ref 3.5–5.2)
Alkaline Phosphatase: 109 U/L (ref 39–117)
BUN: 18 mg/dL (ref 6–23)
CO2: 26 mEq/L (ref 19–32)
Calcium: 9.6 mg/dL (ref 8.4–10.5)
Chloride: 104 mEq/L (ref 96–112)
Creatinine, Ser: 0.99 mg/dL (ref 0.40–1.20)
GFR: 54.75 mL/min — ABNORMAL LOW (ref >60.00)
Glucose, Bld: 148 mg/dL — ABNORMAL HIGH (ref 70–99)
Potassium: 4 mEq/L (ref 3.5–5.1)
Sodium: 141 mEq/L (ref 135–145)
Total Bilirubin: 0.3 mg/dL (ref 0.2–1.2)
Total Protein: 7.6 g/dL (ref 6.0–8.3)

## 2020-04-30 LAB — HIGH SENSITIVITY CRP: CRP, High Sensitivity: 50.51 mg/L — ABNORMAL HIGH (ref 0.000–5.000)

## 2020-04-30 LAB — SEDIMENTATION RATE: Sed Rate: 92 mm/hr — ABNORMAL HIGH (ref 0–30)

## 2020-04-30 MED ORDER — DICYCLOMINE HCL 10 MG PO CAPS: 10.0000 mg | ORAL_CAPSULE | Freq: Three times a day (TID) | ORAL | 0 refills | Status: DC

## 2020-04-30 NOTE — Telephone Encounter (Signed)
The pt has been advised and will come in for stool studies today.  She states she has decreased lialda to 2.4g and will continue prednisone at 5 mg daily.  Dicyclomine has been sent to the pharmacy/

## 2020-05-03 LAB — SALMONELLA/SHIGELLA CULT, CAMPY EIA AND SHIGA TOXIN RFL ECOLI
MICRO NUMBER: 10993049
MICRO NUMBER:: 10993050
MICRO NUMBER:: 10993051
Result:: NOT DETECTED
SHIGA RESULT:: NOT DETECTED
SPECIMEN QUALITY: ADEQUATE
SPECIMEN QUALITY:: ADEQUATE
SPECIMEN QUALITY:: ADEQUATE

## 2020-05-03 LAB — CLOSTRIDIUM DIFFICILE TOXIN B, QUALITATIVE, REAL-TIME PCR

## 2020-05-07 DIAGNOSIS — M62830 Muscle spasm of back: Secondary | ICD-10-CM | POA: Diagnosis not present

## 2020-05-07 LAB — SALMONELLA/SHIGELLA CULT, CAMPY EIA AND SHIGA TOXIN RFL ECOLI

## 2020-05-07 LAB — CLOSTRIDIUM DIFFICILE TOXIN B, QUALITATIVE, REAL-TIME PCR: Toxigenic C. Difficile by PCR: NOT DETECTED

## 2020-05-17 ENCOUNTER — Encounter (HOSPITAL_COMMUNITY): Payer: Self-pay

## 2020-05-17 ENCOUNTER — Inpatient Hospital Stay (HOSPITAL_COMMUNITY): Payer: Medicare HMO

## 2020-05-17 ENCOUNTER — Inpatient Hospital Stay (HOSPITAL_COMMUNITY)
Admission: EM | Admit: 2020-05-17 | Discharge: 2020-05-24 | DRG: 854 | Disposition: A | Discharge status: Home Health | Payer: Medicare HMO | Attending: Internal Medicine | Admitting: Internal Medicine

## 2020-05-17 ENCOUNTER — Encounter (HOSPITAL_COMMUNITY)
Admission: EM | Disposition: A | Discharge status: Home Health | Payer: Self-pay | Source: Home / Self Care | Attending: Internal Medicine

## 2020-05-17 ENCOUNTER — Inpatient Hospital Stay (HOSPITAL_COMMUNITY): Payer: Medicare HMO | Admitting: Certified Registered Nurse Anesthetist

## 2020-05-17 ENCOUNTER — Other Ambulatory Visit: Payer: Self-pay

## 2020-05-17 ENCOUNTER — Emergency Department (HOSPITAL_COMMUNITY): Payer: Medicare HMO

## 2020-05-17 DIAGNOSIS — A4150 Gram-negative sepsis, unspecified: Secondary | ICD-10-CM | POA: Diagnosis not present

## 2020-05-17 DIAGNOSIS — R933 Abnormal findings on diagnostic imaging of other parts of digestive tract: Secondary | ICD-10-CM | POA: Diagnosis not present

## 2020-05-17 DIAGNOSIS — Z809 Family history of malignant neoplasm, unspecified: Secondary | ICD-10-CM | POA: Diagnosis not present

## 2020-05-17 DIAGNOSIS — K5730 Diverticulosis of large intestine without perforation or abscess without bleeding: Secondary | ICD-10-CM | POA: Diagnosis not present

## 2020-05-17 DIAGNOSIS — R9431 Abnormal electrocardiogram [ECG] [EKG]: Secondary | ICD-10-CM | POA: Diagnosis present

## 2020-05-17 DIAGNOSIS — J9601 Acute respiratory failure with hypoxia: Secondary | ICD-10-CM

## 2020-05-17 DIAGNOSIS — A498 Other bacterial infections of unspecified site: Secondary | ICD-10-CM | POA: Diagnosis not present

## 2020-05-17 DIAGNOSIS — D849 Immunodeficiency, unspecified: Secondary | ICD-10-CM | POA: Diagnosis present

## 2020-05-17 DIAGNOSIS — E872 Acidosis: Secondary | ICD-10-CM | POA: Diagnosis not present

## 2020-05-17 DIAGNOSIS — E876 Hypokalemia: Secondary | ICD-10-CM | POA: Diagnosis not present

## 2020-05-17 DIAGNOSIS — N390 Urinary tract infection, site not specified: Secondary | ICD-10-CM | POA: Diagnosis not present

## 2020-05-17 DIAGNOSIS — F419 Anxiety disorder, unspecified: Secondary | ICD-10-CM | POA: Diagnosis not present

## 2020-05-17 DIAGNOSIS — Z79890 Hormone replacement therapy (postmenopausal): Secondary | ICD-10-CM

## 2020-05-17 DIAGNOSIS — M5489 Other dorsalgia: Secondary | ICD-10-CM | POA: Diagnosis not present

## 2020-05-17 DIAGNOSIS — B3789 Other sites of candidiasis: Secondary | ICD-10-CM | POA: Diagnosis not present

## 2020-05-17 DIAGNOSIS — Z79899 Other long term (current) drug therapy: Secondary | ICD-10-CM

## 2020-05-17 DIAGNOSIS — K5190 Ulcerative colitis, unspecified, without complications: Secondary | ICD-10-CM | POA: Diagnosis present

## 2020-05-17 DIAGNOSIS — K449 Diaphragmatic hernia without obstruction or gangrene: Secondary | ICD-10-CM | POA: Diagnosis not present

## 2020-05-17 DIAGNOSIS — N132 Hydronephrosis with renal and ureteral calculous obstruction: Secondary | ICD-10-CM | POA: Diagnosis present

## 2020-05-17 DIAGNOSIS — R197 Diarrhea, unspecified: Secondary | ICD-10-CM | POA: Diagnosis present

## 2020-05-17 DIAGNOSIS — G4730 Sleep apnea, unspecified: Secondary | ICD-10-CM | POA: Diagnosis present

## 2020-05-17 DIAGNOSIS — A419 Sepsis, unspecified organism: Secondary | ICD-10-CM | POA: Diagnosis not present

## 2020-05-17 DIAGNOSIS — R7881 Bacteremia: Secondary | ICD-10-CM

## 2020-05-17 DIAGNOSIS — R6520 Severe sepsis without septic shock: Secondary | ICD-10-CM | POA: Diagnosis present

## 2020-05-17 DIAGNOSIS — E869 Volume depletion, unspecified: Secondary | ICD-10-CM | POA: Diagnosis present

## 2020-05-17 DIAGNOSIS — R1032 Left lower quadrant pain: Secondary | ICD-10-CM | POA: Diagnosis not present

## 2020-05-17 DIAGNOSIS — R6510 Systemic inflammatory response syndrome (SIRS) of non-infectious origin without acute organ dysfunction: Secondary | ICD-10-CM

## 2020-05-17 DIAGNOSIS — E039 Hypothyroidism, unspecified: Secondary | ICD-10-CM | POA: Diagnosis not present

## 2020-05-17 DIAGNOSIS — N179 Acute kidney failure, unspecified: Secondary | ICD-10-CM | POA: Diagnosis not present

## 2020-05-17 DIAGNOSIS — D638 Anemia in other chronic diseases classified elsewhere: Secondary | ICD-10-CM | POA: Diagnosis present

## 2020-05-17 DIAGNOSIS — R0902 Hypoxemia: Secondary | ICD-10-CM | POA: Diagnosis not present

## 2020-05-17 DIAGNOSIS — D72829 Elevated white blood cell count, unspecified: Secondary | ICD-10-CM

## 2020-05-17 DIAGNOSIS — E8770 Fluid overload, unspecified: Secondary | ICD-10-CM | POA: Diagnosis not present

## 2020-05-17 DIAGNOSIS — I700 Atherosclerosis of aorta: Secondary | ICD-10-CM | POA: Diagnosis not present

## 2020-05-17 DIAGNOSIS — R509 Fever, unspecified: Secondary | ICD-10-CM | POA: Diagnosis not present

## 2020-05-17 DIAGNOSIS — N201 Calculus of ureter: Secondary | ICD-10-CM | POA: Diagnosis not present

## 2020-05-17 DIAGNOSIS — A4159 Other Gram-negative sepsis: Principal | ICD-10-CM | POA: Diagnosis present

## 2020-05-17 DIAGNOSIS — K760 Fatty (change of) liver, not elsewhere classified: Secondary | ICD-10-CM | POA: Diagnosis present

## 2020-05-17 DIAGNOSIS — Z9049 Acquired absence of other specified parts of digestive tract: Secondary | ICD-10-CM | POA: Diagnosis not present

## 2020-05-17 DIAGNOSIS — Z20822 Contact with and (suspected) exposure to covid-19: Secondary | ICD-10-CM | POA: Diagnosis present

## 2020-05-17 DIAGNOSIS — R000 Tachycardia, unspecified: Secondary | ICD-10-CM | POA: Diagnosis not present

## 2020-05-17 DIAGNOSIS — D649 Anemia, unspecified: Secondary | ICD-10-CM | POA: Diagnosis not present

## 2020-05-17 DIAGNOSIS — D509 Iron deficiency anemia, unspecified: Secondary | ICD-10-CM | POA: Diagnosis present

## 2020-05-17 DIAGNOSIS — Z8249 Family history of ischemic heart disease and other diseases of the circulatory system: Secondary | ICD-10-CM

## 2020-05-17 DIAGNOSIS — R531 Weakness: Secondary | ICD-10-CM | POA: Diagnosis not present

## 2020-05-17 DIAGNOSIS — K529 Noninfective gastroenteritis and colitis, unspecified: Secondary | ICD-10-CM | POA: Diagnosis not present

## 2020-05-17 DIAGNOSIS — R58 Hemorrhage, not elsewhere classified: Secondary | ICD-10-CM | POA: Diagnosis not present

## 2020-05-17 DIAGNOSIS — R140 Abdominal distension (gaseous): Secondary | ICD-10-CM

## 2020-05-17 DIAGNOSIS — R1111 Vomiting without nausea: Secondary | ICD-10-CM | POA: Diagnosis not present

## 2020-05-17 DIAGNOSIS — I10 Essential (primary) hypertension: Secondary | ICD-10-CM | POA: Diagnosis present

## 2020-05-17 HISTORY — PX: CYSTOSCOPY W/ URETERAL STENT PLACEMENT: SHX1429

## 2020-05-17 LAB — LACTIC ACID, PLASMA
Lactic Acid, Venous: 1.9 mmol/L (ref 0.5–1.9)
Lactic Acid, Venous: 1.5 mmol/L (ref 0.5–1.9)

## 2020-05-17 LAB — CBC
HCT: 36.1 % (ref 36.0–46.0)
Hemoglobin: 10.8 g/dL — ABNORMAL LOW (ref 12.0–15.0)
MCH: 22.7 pg — ABNORMAL LOW (ref 26.0–34.0)
MCHC: 29.9 g/dL — ABNORMAL LOW (ref 30.0–36.0)
MCV: 75.8 fL — ABNORMAL LOW (ref 80.0–100.0)
Platelets: 215 10*3/uL (ref 150–400)
RBC: 4.76 MIL/uL (ref 3.87–5.11)
RDW: 19.5 % — ABNORMAL HIGH (ref 11.5–15.5)
WBC: 36.3 10*3/uL — ABNORMAL HIGH (ref 4.0–10.5)
nRBC: 0 % (ref 0.0–0.2)

## 2020-05-17 LAB — COMPREHENSIVE METABOLIC PANEL
ALT: 32 U/L (ref 0–44)
AST: 32 U/L (ref 15–41)
Albumin: 2.8 g/dL — ABNORMAL LOW (ref 3.5–5.0)
Alkaline Phosphatase: 130 U/L — ABNORMAL HIGH (ref 38–126)
Anion gap: 13 (ref 5–15)
BUN: 45 mg/dL — ABNORMAL HIGH (ref 8–23)
CO2: 20 mmol/L — ABNORMAL LOW (ref 22–32)
Calcium: 7.9 mg/dL — ABNORMAL LOW (ref 8.9–10.3)
Chloride: 103 mmol/L (ref 98–111)
Creatinine, Ser: 4.33 mg/dL — ABNORMAL HIGH (ref 0.44–1.00)
GFR, Estimated: 9 mL/min — ABNORMAL LOW (ref >60)
Glucose, Bld: 116 mg/dL — ABNORMAL HIGH (ref 70–99)
Potassium: 3.9 mmol/L (ref 3.5–5.1)
Sodium: 136 mmol/L (ref 135–145)
Total Bilirubin: 0.8 mg/dL (ref 0.3–1.2)
Total Protein: 5.9 g/dL — ABNORMAL LOW (ref 6.5–8.1)

## 2020-05-17 LAB — TYPE AND SCREEN
ABO/RH(D): O POS
Antibody Screen: NEGATIVE

## 2020-05-17 LAB — RESPIRATORY PANEL BY RT PCR (FLU A&B, COVID)
Influenza A by PCR: NEGATIVE
Influenza B by PCR: NEGATIVE
SARS Coronavirus 2 by RT PCR: NEGATIVE

## 2020-05-17 LAB — PROTIME-INR
INR: 1.4 — ABNORMAL HIGH (ref 0.8–1.2)
Prothrombin Time: 16.6 seconds — ABNORMAL HIGH (ref 11.4–15.2)

## 2020-05-17 LAB — PROCALCITONIN: Procalcitonin: >150 ng/mL

## 2020-05-17 LAB — APTT: aPTT: 42 seconds — ABNORMAL HIGH (ref 24–36)

## 2020-05-17 SURGERY — CYSTOSCOPY, WITH RETROGRADE PYELOGRAM AND URETERAL STENT INSERTION
Anesthesia: General | Laterality: Left

## 2020-05-17 MED ORDER — SODIUM CHLORIDE 0.9 % IV SOLN
2.0000 g | INTRAVENOUS | Status: DC
  Administered 2020-05-17 – 2020-05-19 (×3): 2 g via INTRAVENOUS
  Filled 2020-05-17: qty 20
  Filled 2020-05-17 (×2): qty 2

## 2020-05-17 MED ORDER — IOHEXOL 300 MG/ML  SOLN
INTRAMUSCULAR | Status: DC | PRN
  Administered 2020-05-17: 10 mL via URETHRAL

## 2020-05-17 MED ORDER — SODIUM CHLORIDE 0.9 % IV SOLN
1000.0000 mL | INTRAVENOUS | Status: DC
  Administered 2020-05-17 – 2020-05-19 (×5): 1000 mL via INTRAVENOUS

## 2020-05-17 MED ORDER — SODIUM CHLORIDE 0.9 % IV BOLUS
1000.0000 mL | Freq: Once | INTRAVENOUS | Status: AC
  Administered 2020-05-17: 1000 mL via INTRAVENOUS

## 2020-05-17 MED ORDER — ALPRAZOLAM 0.5 MG PO TABS: 0.5000 mg | ORAL_TABLET | Freq: Every evening | ORAL | Status: DC | PRN

## 2020-05-17 MED ORDER — PHENYLEPHRINE 40 MCG/ML (10ML) SYRINGE FOR IV PUSH (FOR BLOOD PRESSURE SUPPORT)
PREFILLED_SYRINGE | INTRAVENOUS | Status: DC | PRN
  Administered 2020-05-17: 120 ug via INTRAVENOUS
  Administered 2020-05-17 (×2): 80 ug via INTRAVENOUS
  Administered 2020-05-17: 120 ug via INTRAVENOUS

## 2020-05-17 MED ORDER — LEVOTHYROXINE SODIUM 25 MCG PO TABS
37.5000 ug | ORAL_TABLET | Freq: Every day | ORAL | Status: DC
  Administered 2020-05-18 – 2020-05-24 (×7): 37.5 ug via ORAL
  Filled 2020-05-17: qty 1.5
  Filled 2020-05-17 (×6): qty 2
  Filled 2020-05-17: qty 1.5
  Filled 2020-05-17: qty 2

## 2020-05-17 MED ORDER — ONDANSETRON HCL 4 MG/2ML IJ SOLN
INTRAMUSCULAR | Status: DC | PRN
  Administered 2020-05-17: 4 mg via INTRAVENOUS

## 2020-05-17 MED ORDER — MEPERIDINE HCL 50 MG/ML IJ SOLN: 6.2500 mg | INTRAMUSCULAR | Status: DC | PRN

## 2020-05-17 MED ORDER — SODIUM CHLORIDE 0.9 % IV SOLN: 2.0000 g | INTRAVENOUS | Status: DC

## 2020-05-17 MED ORDER — ONDANSETRON HCL 4 MG/2ML IJ SOLN
4.0000 mg | Freq: Four times a day (QID) | INTRAMUSCULAR | Status: DC
  Administered 2020-05-17 – 2020-05-24 (×29): 4 mg via INTRAVENOUS
  Filled 2020-05-17 (×29): qty 2

## 2020-05-17 MED ORDER — LIDOCAINE 2% (20 MG/ML) 5 ML SYRINGE
INTRAMUSCULAR | Status: DC | PRN
  Administered 2020-05-17: 40 mg via INTRAVENOUS
  Administered 2020-05-17: 60 mg via INTRAVENOUS

## 2020-05-17 MED ORDER — PROPOFOL 10 MG/ML IV BOLUS
INTRAVENOUS | Status: AC
  Filled 2020-05-17: qty 20

## 2020-05-17 MED ORDER — CHLORHEXIDINE GLUCONATE CLOTH 2 % EX PADS
6.0000 | MEDICATED_PAD | Freq: Every day | CUTANEOUS | Status: DC
  Administered 2020-05-18 – 2020-05-22 (×5): 6 via TOPICAL

## 2020-05-17 MED ORDER — ONDANSETRON HCL 4 MG/2ML IJ SOLN: 4.0000 mg | Freq: Once | INTRAMUSCULAR | Status: DC | PRN

## 2020-05-17 MED ORDER — ONDANSETRON HCL 4 MG/2ML IJ SOLN
INTRAMUSCULAR | Status: AC
  Filled 2020-05-17: qty 2

## 2020-05-17 MED ORDER — SUCCINYLCHOLINE CHLORIDE 200 MG/10ML IV SOSY
PREFILLED_SYRINGE | INTRAVENOUS | Status: DC | PRN
  Administered 2020-05-17: 160 mg via INTRAVENOUS

## 2020-05-17 MED ORDER — PROPOFOL 10 MG/ML IV BOLUS
INTRAVENOUS | Status: DC | PRN
  Administered 2020-05-17: 150 mg via INTRAVENOUS

## 2020-05-17 MED ORDER — SODIUM CHLORIDE 0.9 % IV BOLUS
500.0000 mL | Freq: Once | INTRAVENOUS | Status: AC
  Administered 2020-05-17: 500 mL via INTRAVENOUS

## 2020-05-17 MED ORDER — ACETAMINOPHEN 650 MG RE SUPP: 650.0000 mg | Freq: Four times a day (QID) | RECTAL | Status: DC | PRN

## 2020-05-17 MED ORDER — IOHEXOL 9 MG/ML PO SOLN
ORAL | Status: AC
  Filled 2020-05-17: qty 1000

## 2020-05-17 MED ORDER — ADALIMUMAB 40 MG/0.4ML ~~LOC~~ AJKT: 40.0000 mg | AUTO-INJECTOR | SUBCUTANEOUS | Status: DC

## 2020-05-17 MED ORDER — PHENYLEPHRINE 40 MCG/ML (10ML) SYRINGE FOR IV PUSH (FOR BLOOD PRESSURE SUPPORT)
PREFILLED_SYRINGE | INTRAVENOUS | Status: AC
  Filled 2020-05-17: qty 20

## 2020-05-17 MED ORDER — PANTOPRAZOLE SODIUM 40 MG IV SOLR
40.0000 mg | Freq: Two times a day (BID) | INTRAVENOUS | Status: DC
  Administered 2020-05-17 – 2020-05-23 (×12): 40 mg via INTRAVENOUS
  Filled 2020-05-17 (×12): qty 40

## 2020-05-17 MED ORDER — HYDROMORPHONE HCL 1 MG/ML IJ SOLN
INTRAMUSCULAR | Status: AC
Start: 2020-05-17 — End: 2020-05-18
  Filled 2020-05-17: qty 1

## 2020-05-17 MED ORDER — HEPARIN SODIUM (PORCINE) 5000 UNIT/ML IJ SOLN
5000.0000 [IU] | Freq: Three times a day (TID) | INTRAMUSCULAR | Status: DC
  Administered 2020-05-17 – 2020-05-24 (×20): 5000 [IU] via SUBCUTANEOUS
  Filled 2020-05-17 (×20): qty 1

## 2020-05-17 MED ORDER — HYDROMORPHONE HCL 1 MG/ML IJ SOLN
0.2500 mg | INTRAMUSCULAR | Status: DC | PRN
  Administered 2020-05-17: 0.5 mg via INTRAVENOUS

## 2020-05-17 MED ORDER — FENTANYL CITRATE (PF) 100 MCG/2ML IJ SOLN
INTRAMUSCULAR | Status: AC
  Filled 2020-05-17: qty 2

## 2020-05-17 MED ORDER — SODIUM CHLORIDE 0.9 % IR SOLN
Status: DC | PRN
  Administered 2020-05-17: 3000 mL

## 2020-05-17 MED ORDER — PROMETHAZINE HCL 25 MG/ML IJ SOLN
12.5000 mg | Freq: Four times a day (QID) | INTRAMUSCULAR | Status: DC | PRN
  Administered 2020-05-17: 12.5 mg via INTRAVENOUS
  Filled 2020-05-17: qty 1

## 2020-05-17 MED ORDER — DEXAMETHASONE SODIUM PHOSPHATE 10 MG/ML IJ SOLN
INTRAMUSCULAR | Status: AC
  Filled 2020-05-17: qty 1

## 2020-05-17 MED ORDER — DEXAMETHASONE SODIUM PHOSPHATE 10 MG/ML IJ SOLN
INTRAMUSCULAR | Status: DC | PRN
  Administered 2020-05-17: 10 mg via INTRAVENOUS

## 2020-05-17 MED ORDER — FENTANYL CITRATE (PF) 100 MCG/2ML IJ SOLN
INTRAMUSCULAR | Status: DC | PRN
  Administered 2020-05-17: 50 ug via INTRAVENOUS

## 2020-05-17 MED ORDER — SODIUM CHLORIDE 0.9 % IV SOLN: INTRAVENOUS | Status: DC

## 2020-05-17 MED ORDER — ACETAMINOPHEN 325 MG PO TABS
650.0000 mg | ORAL_TABLET | Freq: Four times a day (QID) | ORAL | Status: DC | PRN
  Administered 2020-05-17 – 2020-05-20 (×7): 650 mg via ORAL
  Filled 2020-05-17 (×7): qty 2

## 2020-05-17 MED ORDER — MESALAMINE 1.2 G PO TBEC
2.4000 g | DELAYED_RELEASE_TABLET | Freq: Every day | ORAL | Status: DC
  Administered 2020-05-18 – 2020-05-19 (×2): 2.4 g via ORAL
  Filled 2020-05-17 (×5): qty 2

## 2020-05-17 MED ORDER — TRAZODONE HCL 50 MG PO TABS
50.0000 mg | ORAL_TABLET | Freq: Every evening | ORAL | Status: DC | PRN
  Administered 2020-05-18 – 2020-05-20 (×2): 50 mg via ORAL
  Filled 2020-05-17 (×2): qty 1

## 2020-05-17 MED ORDER — LIDOCAINE 2% (20 MG/ML) 5 ML SYRINGE
INTRAMUSCULAR | Status: AC
  Filled 2020-05-17: qty 5

## 2020-05-17 SURGICAL SUPPLY — 14 items
BAG URO CATCHER STRL LF (MISCELLANEOUS) ×2 IMPLANT
CATH INTERMIT  6FR 70CM (CATHETERS) ×2 IMPLANT
CLOTH BEACON ORANGE TIMEOUT ST (SAFETY) ×2 IMPLANT
GLOVE BIO SURGEON STRL SZ 6.5 (GLOVE) ×2 IMPLANT
GOWN STRL REUS W/ TWL LRG LVL3 (GOWN DISPOSABLE) ×1 IMPLANT
GOWN STRL REUS W/TWL LRG LVL3 (GOWN DISPOSABLE) ×8 IMPLANT
GUIDEWIRE STR DUAL SENSOR (WIRE) ×2 IMPLANT
KIT TURNOVER KIT A (KITS) ×1 IMPLANT
MANIFOLD NEPTUNE II (INSTRUMENTS) ×2 IMPLANT
PACK CYSTO (CUSTOM PROCEDURE TRAY) ×2 IMPLANT
STENT URET 6FRX24 CONTOUR (STENTS) ×1 IMPLANT
TRAY FOLEY MTR SLVR 16FR STAT (SET/KITS/TRAYS/PACK) ×1 IMPLANT
TUBING CONNECTING 10 (TUBING) ×2 IMPLANT
TUBING UROLOGY SET (TUBING) IMPLANT

## 2020-05-17 NOTE — Interval H&P Note (Signed)
History and Physical Interval Note: Patient was transferred to [facility] for urgent cystoscopy with retrograde pyelogram and ureteral stent placement on the left.  I introduced myself to the patient and discussed the procedure with her.  She had no questions.  Risks and benefits the procedure were discussed with the patient.  05/17/2020 8:32 PM  [place]  has presented today for surgery, with the diagnosis of left ureteral obstruction.  The various methods of treatment have been discussed with the patient and family. After consideration of risks, benefits and other options for treatment, the patient has consented to  Procedure(s): CYSTOSCOPY WITH RETROGRADE PYELOGRAM/URETERAL STENT PLACEMENT (Left) as a surgical intervention.  The patient's history has been reviewed, patient examined, no change in status, stable for surgery.  I have reviewed the patient's chart and labs.  Questions were answered to the patient's satisfaction.     Janeshia Ciliberto D Zyanna Leisinger

## 2020-05-17 NOTE — Progress Notes (Signed)
[facility] arrived to [facility] to transfer patient to [place] [facility]. Attempted to call report to receiving nurse.

## 2020-05-17 NOTE — Consult Note (Addendum)
Urology Consult  Referring physician: Dr. Carles Collet Reason for referral: Left ureteral calculus  Chief Complaint: left abdominal pain  History of Present Illness: Ms Rhonda Bailey is a 74yo with inflammatory bowel disease who was admitted this morning from the ER with AKI and abdominal pain. She has been having abdominal pain for 1 week. Her last stone event was 05/2019 and she underwent stent placement  05/08/2019. CT was obtained which showed a 6-87m left proximal ureteral calculus. The pain is sharp, intermittent, nonraditing left abdominal pain.  She has associated nausea but no vomiting. She has urinary urgency and frequency. No gross hematuria.  WBC count was 36. Fever 99.  Creatinine 4.3. UA obtained.  Past Medical History:  Diagnosis Date  . Anal fissure   . Anxiety   . Anxiety disorder, unspecified   . Essential (primary) hypertension   . Gastro-esophageal reflux disease with esophagitis   . GERD (gastroesophageal reflux disease)   . Hypokalemia   . Hypothyroidism   . Noninfective gastroenteritis and colitis, unspecified   . Sleep apnea    Past Surgical History:  Procedure Laterality Date  . BIOPSY  09/02/2018   Procedure: BIOPSY;  Surgeon: RDaneil Dolin MD;  [facility];  Service: Endoscopy;;  colon   . CHOLECYSTECTOMY  05/19/2011   Procedure: LAPAROSCOPIC CHOLECYSTECTOMY;  Surgeon: MJamesetta So  Location: [facility];  Service: General;  Laterality: N/A;  . COLONOSCOPY  11/2019   Dr. MRush Landmark  rectal ulceration, TI and IC valve appeared normal, 8 mm polyp in proximal ascending colon that was sessile. Inflammation in transverse colon to cecum, inflammation with altered vascularity, edema, erosions, erythema, friability, shallow ulcerations in a continuous and circumferential pattern from rectum to splenix flexure, single twenty mm ulcer in distal rectum. Small and large-  . COLONOSCOPY WITH PROPOFOL N/A 09/02/2018   Dr. RGala Romney Proctocolitis limited to the left side with some  exudate versus partial pseudomembrane formation, erosions and ulceration somewhat superficial.  Terminal ileum normal.  Diverticulosis.  Descending/sigmoid colon biopsy showing severe active colitis, diagnostic features of IBD not identified.  Differential includes infection, ischemia.  Rectal biopsies with granulation tissue c/w ulcer  . CYSTOSCOPY W/ URETERAL STENT PLACEMENT Right 05/08/2019   Procedure: CYSTOSCOPY WITH RETROGRADE PYELOGRAM/URETERAL STENT PLACEMENT;  Surgeon: EFestus Aloe MD;  Location: [facility];  Service: Urology;  Laterality: Right;  . CYSTOSCOPY WITH URETEROSCOPY Right 06/11/2019   Procedure: CYSTOSCOPY WITH DIAGNOSTIC URETEROSCOPY; URETERAL STENT REMOVAL;  Surgeon: MCleon Gustin MD;  Location: [facility];  Service: Urology;  Laterality: Right;  . HEMORRHOID SURGERY N/A 09/16/2019   Procedure: EXTENSIVE HEMORRHOIDECTOMY;  Surgeon: JAviva Signs MD;  Location: [facility];  Service: General;  Laterality: N/A;  . RECTAL BIOPSY N/A 09/16/2019   Procedure: BIOPSY RECTAL;  Surgeon: JAviva Signs MD;  Location: [facility];  Service: General;  Laterality: N/A;  . TUBAL LIGATION      Medications: I have reviewed the patient's current medications. Allergies:  Allergies  Allergen Reactions  . Tramadol     Says "blew my head up."    Family History  Problem Relation Age of Onset  . Cancer Father        mouth  . Heart disease Sister   . Heart disease Brother   . Cancer Brother        head  . Cancer Daughter        ? type  . Anesthesia problems Neg Hx   . Hypotension Neg Hx   . Malignant hyperthermia Neg  Hx   . Pseudochol deficiency Neg Hx   . Colon cancer Neg Hx   . Esophageal cancer Neg Hx   . Inflammatory bowel disease Neg Hx   . Liver disease Neg Hx   . Pancreatic cancer Neg Hx   . Rectal cancer Neg Hx   . Stomach cancer Neg Hx    Social History:  reports that she has never smoked. She has never used smokeless tobacco. She reports previous alcohol use. She reports that  she does not use drugs.  Review of Systems  Constitutional: Positive for fever.  Gastrointestinal: Positive for nausea and vomiting.  Genitourinary: Positive for flank pain.  All other systems reviewed and are negative.   Physical Exam:  Vital signs in last 24 hours: Temp:  [98.4 F (36.9 C)-99 F (37.2 C)] 99 F (37.2 C) (10/11 1500) Pulse Rate:  [101-113] 101 (10/11 1500) Resp:  [18-30] 20 (10/11 1500) BP: (81-141)/(41-68) 100/47 (10/11 1500) SpO2:  [92 %-100 %] 99 % (10/11 1500) Weight:  [73.9 kg] 73.9 kg (10/11 0936) Physical Exam Vitals and nursing note reviewed.  Constitutional:      Appearance: Normal appearance.  HENT:     Head: Normocephalic and atraumatic.     Nose: Nose normal.  Eyes:     Extraocular Movements: Extraocular movements intact.     Pupils: Pupils are equal, round, and reactive to light.  Cardiovascular:     Rate and Rhythm: Normal rate and regular rhythm.  Pulmonary:     Effort: Pulmonary effort is normal. No respiratory distress.  Abdominal:     General: Abdomen is flat. There is no distension.  Musculoskeletal:        General: No swelling. Normal range of motion.     Cervical back: Normal range of motion and neck supple.  Skin:    General: Skin is warm and dry.  Neurological:     General: No focal deficit present.     Mental Status: She is alert and oriented to person, place, and time.  Psychiatric:        Mood and Affect: Mood normal.        Behavior: Behavior normal.        Thought Content: Thought content normal.        Judgment: Judgment normal.     Laboratory Data:  Results for orders placed or performed during the hospital encounter of 05/17/20 (from the past 72 hour(s))  Comprehensive metabolic panel     Status: Abnormal   Collection Time: 05/17/20 10:15 AM  Result Value Ref Range   Sodium 136 135 - 145 mmol/L   Potassium 3.9 3.5 - 5.1 mmol/L   Chloride 103 98 - 111 mmol/L   CO2 20 (L) 22 - 32 mmol/L   Glucose, Bld 116 (H)  70 - 99 mg/dL    Comment: Glucose reference range applies only to samples taken after fasting for at least 8 hours.   BUN 45 (H) 8 - 23 mg/dL   Creatinine, Ser 4.33 (H) 0.44 - 1.00 mg/dL   Calcium 7.9 (L) 8.9 - 10.3 mg/dL   Total Protein 5.9 (L) 6.5 - 8.1 g/dL   Albumin 2.8 (L) 3.5 - 5.0 g/dL   AST 32 15 - 41 U/L   ALT 32 0 - 44 U/L   Alkaline Phosphatase 130 (H) 38 - 126 U/L   Total Bilirubin 0.8 0.3 - 1.2 mg/dL   GFR, Estimated 9 (L) >60 mL/min   Anion gap 13 5 -  15    Comment: Performed at [facility], [address], [place], [place] [address]  CBC     Status: Abnormal   Collection Time: 05/17/20 10:15 AM  Result Value Ref Range   WBC 36.3 (H) 4.0 - 10.5 K/uL   RBC 4.76 3.87 - 5.11 MIL/uL   Hemoglobin 10.8 (L) 12.0 - 15.0 g/dL   HCT 36.1 36 - 46 %   MCV 75.8 (L) 80.0 - 100.0 fL   MCH 22.7 (L) 26.0 - 34.0 pg   MCHC 29.9 (L) 30.0 - 36.0 g/dL   RDW 19.5 (H) 11.5 - 15.5 %   Platelets 215 150 - 400 K/uL   nRBC 0.0 0.0 - 0.2 %    Comment: Performed at [facility], [address], [place], [place] [address]  Type and screen [facility]     Status: None   Collection Time: 05/17/20 10:15 AM  Result Value Ref Range   ABO/RH(D) O POS    Antibody Screen NEG    Sample Expiration      05/20/2020,2359 Performed at [facility], [address] [place],  [address]   Respiratory Panel by RT PCR (Flu A&B, Covid) - Nasopharyngeal Swab     Status: None   Collection Time: 05/17/20 11:21 AM   Specimen: Nasopharyngeal Swab  Result Value Ref Range   SARS Coronavirus 2 by RT PCR NEGATIVE NEGATIVE    Comment: (NOTE) SARS-CoV-2 target nucleic acids are NOT DETECTED.  The SARS-CoV-2 RNA is generally detectable in upper respiratoy specimens during the acute phase of infection. The lowest concentration of SARS-CoV-2 viral copies this assay can detect is 131 copies/mL. A negative result does not preclude SARS-Cov-2 infection and should not be used as the sole basis for treatment  or other patient management decisions. A negative result may occur with  improper specimen collection/handling, submission of specimen other than nasopharyngeal swab, presence of viral mutation(s) within the areas targeted by this assay, and inadequate number of viral copies (<131 copies/mL). A negative result must be combined with clinical observations, patient history, and epidemiological information. The expected result is Negative.  Fact Sheet for Patients:  PinkCheek.be  Fact Sheet for Healthcare Providers:  GravelBags.it  This test is no t yet approved or cleared by the [place] FDA and  has been authorized for detection and/or diagnosis of SARS-CoV-2 by FDA under an Emergency Use Authorization (EUA). This EUA will remain  in effect (meaning this test can be used) for the duration of the COVID-19 declaration under Section 564(b)(1) of the Act, 21 U.S.C. section 360bbb-3(b)(1), unless the authorization is terminated or revoked sooner.     Influenza A by PCR NEGATIVE NEGATIVE   Influenza B by PCR NEGATIVE NEGATIVE    Comment: (NOTE) The Xpert Xpress SARS-CoV-2/FLU/RSV assay is intended as an aid in  the diagnosis of influenza from Nasopharyngeal swab specimens and  should not be used as a sole basis for treatment. Nasal washings and  aspirates are unacceptable for Xpert Xpress SARS-CoV-2/FLU/RSV  testing.  Fact Sheet for Patients: PinkCheek.be  Fact Sheet for Healthcare Providers: GravelBags.it  This test is not yet approved or cleared by the [place] FDA and  has been authorized for detection and/or diagnosis of SARS-CoV-2 by  FDA under an Emergency Use Authorization (EUA). This EUA will remain  in effect (meaning this test can be used) for the duration of the  Covid-19 declaration under Section 564(b)(1) of the Act, 21  U.S.C. section  360bbb-3(b)(1), unless  the authorization is  terminated or revoked. Performed at [facility], [address], [place], [place] [address]   Lactic acid, plasma     Status: None   Collection Time: 05/17/20  1:38 PM  Result Value Ref Range   Lactic Acid, Venous 1.9 0.5 - 1.9 mmol/L    Comment: Performed at [facility], [address] [place], [place] [address]  Procalcitonin - Baseline     Status: None   Collection Time: 05/17/20  1:38 PM  Result Value Ref Range   Procalcitonin >150.00 ng/mL    Comment:        Interpretation: PCT >= 10 ng/mL: Important systemic inflammatory response, almost exclusively due to severe bacterial sepsis or septic shock. (NOTE)       Sepsis PCT Algorithm           Lower Respiratory Tract                                      Infection PCT Algorithm    ----------------------------     ----------------------------         PCT < 0.25 ng/mL                PCT < 0.10 ng/mL          Strongly encourage             Strongly discourage   discontinuation of antibiotics    initiation of antibiotics    ----------------------------     -----------------------------       PCT 0.25 - 0.50 ng/mL            PCT 0.10 - 0.25 ng/mL               OR       >80% decrease in PCT            Discourage initiation of                                            antibiotics      Encourage discontinuation           of antibiotics    ----------------------------     -----------------------------         PCT >= 0.50 ng/mL              PCT 0.26 - 0.50 ng/mL                AND       <80% decrease in PCT             Encourage initiation of                                             antibiotics       Encourage continuation           of antibiotics    ----------------------------     -----------------------------        PCT >= 0.50 ng/mL                  PCT > 0.50 ng/mL  AND         increase in PCT                  Strongly encourage                                       initiation of antibiotics    Strongly encourage escalation           of antibiotics                                     -----------------------------                                           PCT <= 0.25 ng/mL                                                 OR                                        > 80% decrease in PCT                                      Discontinue / Do not initiate                                             antibiotics  Performed at [facility], [address] [place], [place] [address]   Protime-INR     Status: Abnormal   Collection Time: 05/17/20  2:48 PM  Result Value Ref Range   Prothrombin Time 16.6 (H) 11.4 - 15.2 seconds   INR 1.4 (H) 0.8 - 1.2    Comment: (NOTE) INR goal varies based on device and disease states. Performed at [facility], [address], [place], [place] [address]   APTT     Status: Abnormal   Collection Time: 05/17/20  2:48 PM  Result Value Ref Range   aPTT 42 (H) 24 - 36 seconds    Comment:        IF BASELINE aPTT IS ELEVATED, SUGGEST PATIENT RISK ASSESSMENT BE USED TO DETERMINE APPROPRIATE ANTICOAGULANT THERAPY. Performed at [facility], [address] [place],  [address]   Lactic acid, plasma     Status: None   Collection Time: 05/17/20  2:49 PM  Result Value Ref Range   Lactic Acid, Venous 1.5 0.5 - 1.9 mmol/L    Comment: Performed at [facility], [address], [place],  [address]   Recent Results (from the past 240 hour(s))  Respiratory Panel by RT PCR (Flu A&B, Covid) - Nasopharyngeal Swab     Status: None   Collection Time: 05/17/20 11:21 AM   Specimen: Nasopharyngeal Swab  Result Value Ref Range Status   SARS Coronavirus 2 by RT  PCR NEGATIVE NEGATIVE Final    Comment: (NOTE) SARS-CoV-2 target nucleic acids are NOT DETECTED.  The SARS-CoV-2 RNA is generally detectable in upper respiratoy specimens during the acute phase of infection. The lowest concentration of SARS-CoV-2 viral copies this  assay can detect is 131 copies/mL. A negative result does not preclude SARS-Cov-2 infection and should not be used as the sole basis for treatment or other patient management decisions. A negative result may occur with  improper specimen collection/handling, submission of specimen other than nasopharyngeal swab, presence of viral mutation(s) within the areas targeted by this assay, and inadequate number of viral copies (<131 copies/mL). A negative result must be combined with clinical observations, patient history, and epidemiological information. The expected result is Negative.  Fact Sheet for Patients:  PinkCheek.be  Fact Sheet for Healthcare Providers:  GravelBags.it  This test is no t yet approved or cleared by the [place] FDA and  has been authorized for detection and/or diagnosis of SARS-CoV-2 by FDA under an Emergency Use Authorization (EUA). This EUA will remain  in effect (meaning this test can be used) for the duration of the COVID-19 declaration under Section 564(b)(1) of the Act, 21 U.S.C. section 360bbb-3(b)(1), unless the authorization is terminated or revoked sooner.     Influenza A by PCR NEGATIVE NEGATIVE Final   Influenza B by PCR NEGATIVE NEGATIVE Final    Comment: (NOTE) The Xpert Xpress SARS-CoV-2/FLU/RSV assay is intended as an aid in  the diagnosis of influenza from Nasopharyngeal swab specimens and  should not be used as a sole basis for treatment. Nasal washings and  aspirates are unacceptable for Xpert Xpress SARS-CoV-2/FLU/RSV  testing.  Fact Sheet for Patients: PinkCheek.be  Fact Sheet for Healthcare Providers: GravelBags.it  This test is not yet approved or cleared by the [place] FDA and  has been authorized for detection and/or diagnosis of SARS-CoV-2 by  FDA under an Emergency Use Authorization (EUA). This EUA will  remain  in effect (meaning this test can be used) for the duration of the  Covid-19 declaration under Section 564(b)(1) of the Act, 21  U.S.C. section 360bbb-3(b)(1), unless the authorization is  terminated or revoked. Performed at [facility], [address], [place], [place] [address]    Creatinine: Recent Labs    05/17/20 1015  CREATININE 4.33*   Baseline Creatinine: 0.97  Impression/Assessment:  74yo with a left ureteral calculus, ARF, sepsis from a urinary source  Plan:  -We discussed the management of kidney stones. These options include observation, ureteroscopy, shockwave lithotripsy (ESWL) and percutaneous nephrolithotomy (PCNL). We discussed which options are relevant to the patient's stone(s). We discussed the natural history of kidney stones as well as the complications of untreated stones and the impact on quality of life without treatment as well as with each of the above listed treatments. We also discussed the efficacy of each treatment in its ability to clear the stone burden. With any of these management options I discussed the signs and symptoms of infection and the need for emergent treatment should these be experienced. For each option we discussed the ability of each procedure to clear the patient of their stone burden.   For observation I described the risks which include but are not limited to silent renal damage, life-threatening infection, need for emergent surgery, failure to pass stone and pain.   For ureteroscopy I described the risks which include bleeding, infection, damage to contiguous structures, positioning injury, ureteral stricture, ureteral avulsion, ureteral injury, need for prolonged ureteral  stent, inability to perform ureteroscopy, need for an interval procedure, inability to clear stone burden, stent discomfort/pain, heart attack, stroke, pulmonary embolus and the inherent risks with general anesthesia.   For shockwave lithotripsy I described the  risks which include arrhythmia, kidney contusion, kidney hemorrhage, need for transfusion, pain, inability to adequately break up stone, inability to pass stone fragments, Steinstrasse, infection associated with obstructing stones, need for alternate surgical procedure, need for repeat shockwave lithotripsy, MI, CVA, PE and the inherent risks with anesthesia/conscious sedation.   For PCNL I described the risks including positioning injury, pneumothorax, hydrothorax, need for chest tube, inability to clear stone burden, renal laceration, arterial venous fistula or malformation, need for embolization of kidney, loss of kidney or renal function, need for repeat procedure, need for prolonged nephrostomy tube, ureteral avulsion, MI, CVA, PE and the inherent risks of general anesthesia.   - The patient would like to proceed with Left ureteral stent placement  Nicolette Bang 05/17/2020, 4:44 PM

## 2020-05-17 NOTE — Progress Notes (Signed)
Discussed with Dr. Link Snuffer transfer to [facility] for ureteral stent placement -continue ceftriaxone -will also need  GI to be consulted in am to follow -she is a normally a Dr. [name] patient  DTat

## 2020-05-17 NOTE — Anesthesia Procedure Notes (Signed)
Procedure Name: Intubation Date/Time: 05/17/2020 9:03 PM Performed by: Montel Clock, CRNA Pre-anesthesia Checklist: Patient identified, Emergency Drugs available, Suction available, Patient being monitored and Timeout performed Patient Re-evaluated:Patient Re-evaluated prior to induction Oxygen Delivery Method: Circle system utilized Preoxygenation: Pre-oxygenation with 100% oxygen Induction Type: IV induction, Rapid sequence and Cricoid Pressure applied Laryngoscope Size: Mac and 3 Grade View: Grade II Tube type: Oral Tube size: 7.0 mm Number of attempts: 1 Airway Equipment and Method: Stylet Placement Confirmation: ETT inserted through vocal cords under direct vision,  positive ETCO2 and breath sounds checked- equal and bilateral Secured at: 21 cm Tube secured with: Tape Dental Injury: Teeth and Oropharynx as per pre-operative assessment

## 2020-05-17 NOTE — H&P (Addendum)
History and Physical  Rhonda Bailey WUJ:[id number] DOB: 11-04-1945 DOA: 05/17/2020   PCP: [name], MD   Patient coming from: Home  Chief Complaint: generalized weakness, diarrhea  HPI:  Rhonda Bailey is a 74 y.o. female with medical history of inflammatory bowel disease initially diagnosed via colonoscopy on 12/04/2019.  The patient also has history of hypothyroidism, anxiety, and iron deficiency anemia.  The patient has been receiving Humira under the guidance of Dr. [name].  In addition, the patient has been on Lialda and azathioprine as well as a prednisone taper for her inflammatory bowel disease.  She is down to prednisone 5 mg daily with which she is due to finish in 3 days.  She presents with 1 week history of nausea, vomiting, and diarrhea.  The patient has been having 8 loose stools on a daily basis without any hematochezia.  She has having some black stools, but she is taking iron.  Because of her vomiting, the patient states that she has not been able to keep down her medications for about 48 hours prior to this admission.  She has had increasing generalized weakness and difficulty getting out of bed today.  As a result EMS was activated.  EMS noted the patient to have a systolic blood pressure in the 70s.  The patient denies any frank fevers, chills, headache, neck pain, chest pain, shortness breath, coughing, hemoptysis, hematemesis, dysuria, hematuria.  She does complaint of bilateral flank pain that has been present for about 5 days.  She had a C. difficile assay performed on 04/30/2020 which was negative. In the emergency department, the patient was afebrile with hypotension initially 81/68.  She was started on IV fluids with some improvement of her blood pressure.  BMP showed a serum creatinine of 4.33 which is above her usual baseline.  LFTs were unremarkable.  WBC 36.3, hemoglobin 10.8, platelets 215,000.  Hospitalist was contacted for admission for further evaluation  and treatment.  She has been fully vaccinated for COVID with Moderna.  Assessment/Plan: SIRS -Source unclear presently, but concerned about infectious process -Present on admission -Presented with tachycardia, leukocytosis -Blood cultures x2 sets -UA and urine culture -Obtain chest x-ray -Check lactic acid  -Check procalcitonin -Check coags  -SARS-CoV2--NEG -Repeat C. Difficile -Stool for stool pathogen panel -Holding azathioprine temporarily secondary to possible infectious process  Hypotension -due volume depletion, but concerned about sepsis -bolus NS -start IVF  Inflammatory bowel disease/Diarrhea -Continue Lialda -The patient is due for her next dose of Humira on 05/22/2020 -Give stress dose steroids temporarily -IBD flare vs new infection -CT abd/pelvis -GI consult  Hypothyroidism -Continue Synthroid  Anxiety -Continue trazodone and alprazolam  Iron deficiency anemia -Restart iron supplementation once able to tolerate p.o.        Past Medical History:  Diagnosis Date  . Anal fissure   . Anxiety   . Anxiety disorder, unspecified   . Essential (primary) hypertension   . Gastro-esophageal reflux disease with esophagitis   . GERD (gastroesophageal reflux disease)   . Hypokalemia   . Hypothyroidism   . Noninfective gastroenteritis and colitis, unspecified   . Sleep apnea    Past Surgical History:  Procedure Laterality Date  . BIOPSY  09/02/2018   Procedure: BIOPSY;  Surgeon: Daneil Dolin, MD;  Location: [facility];  Service: Endoscopy;;  colon   . CHOLECYSTECTOMY  05/19/2011   Procedure: LAPAROSCOPIC CHOLECYSTECTOMY;  Surgeon: Jamesetta So;  Location: [facility];  Service: General;  Laterality:  N/A;  . COLONOSCOPY WITH PROPOFOL N/A 09/02/2018   Dr. Gala Romney: Proctocolitis limited to the left side with some exudate versus partial pseudomembrane formation, erosions and ulceration somewhat superficial.  Terminal ileum normal.  Diverticulosis.   Descending/sigmoid colon biopsy showing severe active colitis, diagnostic features of IBD not identified.  Differential includes infection, ischemia.  Rectal biopsies with granulation tissue c/w ulcer  . CYSTOSCOPY W/ URETERAL STENT PLACEMENT Right 05/08/2019   Procedure: CYSTOSCOPY WITH RETROGRADE PYELOGRAM/URETERAL STENT PLACEMENT;  Surgeon: Festus Aloe, MD;  Location: [facility];  Service: Urology;  Laterality: Right;  . CYSTOSCOPY WITH URETEROSCOPY Right 06/11/2019   Procedure: CYSTOSCOPY WITH DIAGNOSTIC URETEROSCOPY; URETERAL STENT REMOVAL;  Surgeon: Cleon Gustin, MD;  Location: [facility];  Service: Urology;  Laterality: Right;  . HEMORRHOID SURGERY N/A 09/16/2019   Procedure: EXTENSIVE HEMORRHOIDECTOMY;  Surgeon: Aviva Signs, MD;  Location: [facility];  Service: General;  Laterality: N/A;  . RECTAL BIOPSY N/A 09/16/2019   Procedure: BIOPSY RECTAL;  Surgeon: Aviva Signs, MD;  Location: [facility];  Service: General;  Laterality: N/A;  . TUBAL LIGATION     Social History:  reports that she has never smoked. She has never used smokeless tobacco. She reports previous alcohol use. She reports that she does not use drugs.   Family History  Problem Relation Age of Onset  . Cancer Father        mouth  . Heart disease Sister   . Heart disease Brother   . Cancer Brother        head  . Cancer Daughter        ? type  . Anesthesia problems Neg Hx   . Hypotension Neg Hx   . Malignant hyperthermia Neg Hx   . Pseudochol deficiency Neg Hx   . Colon cancer Neg Hx   . Esophageal cancer Neg Hx   . Inflammatory bowel disease Neg Hx   . Liver disease Neg Hx   . Pancreatic cancer Neg Hx   . Rectal cancer Neg Hx   . Stomach cancer Neg Hx      Allergies  Allergen Reactions  . Tramadol     Says "blew my head up."     Prior to Admission medications   Medication Sig Start Date End Date Taking? Authorizing Provider  acidophilus (RISAQUAD) CAPS capsule Take 1 capsule by mouth daily.    [provider]  Adalimumab (HUMIRA PEN) 40 MG/0.4ML PNKT Inject 40 mg into the skin every 14 (fourteen) days. 12/11/19   Mansouraty, Telford Nab., MD  ALPRAZolam Duanne Moron) 0.5 MG tablet Take 0.5 mg by mouth at bedtime as needed for sleep.    [provider]  amitriptyline (ELAVIL) 10 MG tablet Take 10 mg by mouth daily.  11/04/19   [provider]  ANECREAM5 5 % CREA APPLY TO ANUS EVERY 8 HOURS AS NEEDED FOR PAIN Patient taking differently: Place 1 application rectally every 8 (eight) hours as needed (pain).  11/05/19   Aviva Signs, MD  azaTHIOprine (IMURAN) 50 MG tablet Take 1 tablet (50 mg total) by mouth daily. 12/11/19   Mansouraty, Telford Nab., MD  dicyclomine (BENTYL) 10 MG capsule Take 1 capsule (10 mg total) by mouth 4 (four) times daily -  before meals and at bedtime. 04/30/20   Mansouraty, Telford Nab., MD  ferrous gluconate (FERGON) 324 MG tablet TAKE 1 TABLET BY MOUTH DAILY WITH BREAKFAST 04/09/20   Mansouraty, Telford Nab., MD  HYDROCORTISONE ACE, RECTAL, 30 MG SUPP Place 1 suppository (30  mg total) rectally in the morning and at bedtime. 10/29/19   Esterwood, Amy S, PA-C  levothyroxine (SYNTHROID, LEVOTHROID) 25 MCG tablet Take 25 mcg by mouth daily before breakfast.     [provider]  mesalamine (LIALDA) 1.2 g EC tablet TAKE 4 TABLETS BY MOUTH DAILY WITH BREAKFAST. 03/30/20   Mansouraty, Telford Nab., MD  ondansetron (ZOFRAN ODT) 8 MG disintegrating tablet Take 1 tablet (8 mg total) by mouth every 8 (eight) hours as needed for nausea or vomiting. 10/16/19   Barb Merino, MD  pantoprazole (PROTONIX) 40 MG tablet Take 40 mg by mouth at bedtime.  08/17/18   [provider]  predniSONE (DELTASONE) 5 MG tablet Start Taper - 03/27/20- 6m daily thru- 04/20/20-10 mg daily, thur- 04/24/20- 574mdaily, thru- 05/08/20-0 03/12/20   Mansouraty, GaTelford Nab MD  traZODone (DESYREL) 50 MG tablet Take 50 mg by mouth at bedtime as needed for sleep.  08/25/18   [provider]     Review of Systems:  Constitutional:  No weight loss, night sweats,   Head&Eyes: No headache.  No vision loss.  No eye pain or scotoma ENT:  No Difficulty swallowing,Tooth/dental problems,Sore throat,  No ear ache, post nasal drip,  Cardio-vascular:  No chest pain, Orthopnea, PND, swelling in lower extremities,  dizziness, palpitations  GI:  No  abdominal pain,hematochezia, heartburn, indigestion, Resp:  No shortness of breath with exertion or at rest. No cough. No coughing up of blood .No wheezing.No chest wall deformity  Skin:  no rash or lesions.  GU:  no dysuria, change in color of urine, no urgency or frequency. No flank pain.  Musculoskeletal:  No joint pain or swelling. No decreased range of motion. No back pain.  Psych:  No change in mood or affect. No depression or anxiety. Neurologic: No headache, no dysesthesia, no focal weakness, no vision loss. No syncope  Physical Exam: Vitals:   05/17/20 1030 05/17/20 1045 05/17/20 1100 05/17/20 1130  BP: (!) 100/54 (!) 91/55 (!) 93/49 (!) 96/41  Pulse: (!) 113 (!) 111 (!) 110 (!) 111  Resp: (!) 24 20 (!) 22 (!) 23  Temp:      TempSrc:      SpO2: 96% 100% 98% 92%  Weight:      Height:       General:  A&O x 3, NAD, nontoxic, pleasant/cooperative Head/Eye: No conjunctival hemorrhage, no icterus, [place]/AT, No nystagmus ENT:  No icterus,  No thrush, good dentition, no pharyngeal exudate Neck:  No masses, no lymphadenpathy, no bruits CV:  RRR, no rub, no gallop, no S3 Lung:  CTAB, good air movement, no wheeze, no rhonchi Abdomen: soft/NT, +BS, nondistended, no peritoneal signs;  +bilateral flank tender Ext: No cyanosis, No rashes, No petechiae, No lymphangitis, No edema Neuro: CNII-XII intact, strength 4/5 in bilateral upper and lower extremities, no dysmetria  Labs on Admission:  Basic Metabolic Panel: Recent Labs  Lab 05/17/20 1015  NA 136  K 3.9  CL 103  CO2 20*  GLUCOSE 116*  BUN 45*  CREATININE 4.33*   CALCIUM 7.9*   Liver Function Tests: Recent Labs  Lab 05/17/20 1015  AST 32  ALT 32  ALKPHOS 130*  BILITOT 0.8  PROT 5.9*  ALBUMIN 2.8*   No results for input(s): LIPASE, AMYLASE in the last 168 hours. No results for input(s): AMMONIA in the last 168 hours. CBC: Recent Labs  Lab 05/17/20 1015  WBC 36.3*  HGB 10.8*  HCT 36.1  MCV 75.8*  PLT  215   Coagulation Profile: No results for input(s): INR, PROTIME in the last 168 hours. Cardiac Enzymes: No results for input(s): CKTOTAL, CKMB, CKMBINDEX, TROPONINI in the last 168 hours. BNP: Invalid input(s): POCBNP CBG: No results for input(s): GLUCAP in the last 168 hours. Urine analysis:    Component Value Date/Time   COLORURINE YELLOW 04/23/2020 1102   APPEARANCEUR CLEAR 04/23/2020 1102   LABSPEC 1.025 04/23/2020 1102   PHURINE 5.5 04/23/2020 1102   GLUCOSEU NEGATIVE 04/23/2020 1102   HGBUR TRACE-LYSED (A) 04/23/2020 1102   BILIRUBINUR NEGATIVE 04/23/2020 1102   KETONESUR NEGATIVE 04/23/2020 1102   PROTEINUR NEGATIVE 10/13/2019 1135   UROBILINOGEN 0.2 04/23/2020 1102   NITRITE NEGATIVE 04/23/2020 1102   LEUKOCYTESUR TRACE (A) 04/23/2020 1102   Sepsis Labs: @LABRCNTIP (procalcitonin:4,lacticidven:4) )No results found for this or any previous visit (from the past 240 hour(s)).   Radiological Exams on Admission: No results found.  EKG: Independently reviewed. Sinus, no STT change    Time spent:70 minutes Code Status:   FULL Family Communication:  No Family at bedside Disposition Plan: expect 2-3 day hospitalization Consults called: none DVT Prophylaxis: [place] Heparin    Orson Eva, DO  [facility] Pager [phone]  If 7PM-7AM, please contact night-coverage [email or web address] Password [facility] 05/17/2020, 12:17 PM

## 2020-05-17 NOTE — ED Notes (Signed)
Pt transported to CT ?

## 2020-05-17 NOTE — Anesthesia Preprocedure Evaluation (Signed)
Anesthesia Evaluation  Patient identified by MRN, date of birth, ID band Patient awake    Reviewed: Allergy & Precautions, NPO status , Patient's Chart, lab work & pertinent test results  Airway Mallampati: I  TM Distance: >3 FB Neck ROM: Full    Dental   Pulmonary sleep apnea ,    Pulmonary exam normal        Cardiovascular hypertension, Pt. on medications Normal cardiovascular exam     Neuro/Psych Anxiety    GI/Hepatic GERD  Medicated and Controlled,  Endo/Other    Renal/GU      Musculoskeletal   Abdominal   Peds  Hematology   Anesthesia Other Findings   Reproductive/Obstetrics                             Anesthesia Physical Anesthesia Plan  ASA: III and emergent  Anesthesia Plan: General   Post-op Pain Management:    Induction: Intravenous, Rapid sequence and Cricoid pressure planned  PONV Risk Score and Plan: 3 and Ondansetron and Treatment may vary due to age or medical condition  Airway Management Planned: Oral ETT  Additional Equipment:   Intra-op Plan:   Post-operative Plan: Extubation in OR  Informed Consent: I have reviewed the patients History and Physical, chart, labs and discussed the procedure including the risks, benefits and alternatives for the proposed anesthesia with the patient or authorized representative who has indicated his/her understanding and acceptance.       Plan Discussed with: CRNA and Surgeon  Anesthesia Plan Comments:         Anesthesia Quick Evaluation

## 2020-05-17 NOTE — Op Note (Signed)
Preoperative diagnosis:  1. Left ureteral calculus with clinical signs of sepsis  Postoperative diagnosis:  1. Same  Procedure:  1. Cystoscopy 2. left ureteral stent placement 3. left retrograde pyelography with interpretation   Surgeon: Jacalyn Lefevre, MD  Anesthesia: General  Complications: None  Intraoperative findings:  left retrograde pyelography demonstrated a filling defect within the left ureter consistent with the patient's known calculus; the entire renal pelvis was not distended with contrast due to patient's clinical state  EBL: Minimal  Specimens: None  Indication: Rhonda Bailey is a 74 y.o. patient who presented to the [facility], ER with 1 week of abdominal pain found to have an AKI as well as a 6 mm left proximal obstructing ureteral calculus with clinical signs of sepsis.. After reviewing the management options for treatment, he elected to proceed with the above surgical procedure(s). We have discussed the potential benefits and risks of the procedure, side effects of the proposed treatment, the likelihood of the patient achieving the goals of the procedure, and any potential problems that might occur during the procedure or recuperation. Informed consent has been obtained.  Description of procedure:  The patient was taken to the operating room and general anesthesia was induced.  The patient was placed in the dorsal lithotomy position, prepped and draped in the usual sterile fashion, and preoperative antibiotics were administered. A preoperative time-out was performed.   Cystourethroscopy was performed.  The patient's urethra was examined and was normal. The bladder was then systematically examined in its entirety. There was no evidence for any bladder tumors, stones, or other mucosal pathology.    Attention then turned to the leftureteral orifice and a ureteral catheter was used to intubate the ureteral orifice.  Omnipaque contrast was injected through the ureteral  catheter and a retrograde pyelogram was performed with findings as dictated above.  A 0.38 sensor guidewire was then advanced up the left ureter into the renal pelvis under fluoroscopic guidance.  The wire was then backloaded through the cystoscope and a ureteral stent was advance over the wire using Seldinger technique.  The stent was positioned appropriately under fluoroscopic and cystoscopic guidance.  The wire was then removed with an adequate stent curl noted in the renal pelvis as well as in the bladder.  The bladder was then emptied and the procedure ended.  The patient appeared to tolerate the procedure well and without complications.  The patient was able to be awakened and transferred to the recovery unit in satisfactory condition.   Disposition: Stable; the patient be transferred to the floor and admitted for further medical management; she will need staged procedure in approximately 2 to 3 weeks.  Jacalyn Lefevre, M.D.

## 2020-05-17 NOTE — Progress Notes (Signed)
CT report reviewed -L-ureteral stone noted -consulted urology--I spoke with Dr. [name] who graciously agreed to see patient -start ceftriaxone IV -GI already following for bowel wall thickening  DTat

## 2020-05-17 NOTE — H&P (View-Only) (Signed)
Urology Consult  Referring physician: Dr. Carles Collet Reason for referral: Left ureteral calculus  Chief Complaint: left abdominal pain  History of Present Illness: Ms Heimsoth is a 74yo with inflammatory bowel disease who was admitted this morning from the ER with AKI and abdominal pain. She has been having abdominal pain for 1 week. Her last stone event was 05/2019 and she underwent stent placement  05/08/2019. CT was obtained which showed a 6-52m left proximal ureteral calculus. The pain is sharp, intermittent, nonraditing left abdominal pain.  She has associated nausea but no vomiting. She has urinary urgency and frequency. No gross hematuria.  WBC count was 36. Fever 99.  Creatinine 4.3. UA obtained.  Past Medical History:  Diagnosis Date  . Anal fissure   . Anxiety   . Anxiety disorder, unspecified   . Essential (primary) hypertension   . Gastro-esophageal reflux disease with esophagitis   . GERD (gastroesophageal reflux disease)   . Hypokalemia   . Hypothyroidism   . Noninfective gastroenteritis and colitis, unspecified   . Sleep apnea    Past Surgical History:  Procedure Laterality Date  . BIOPSY  09/02/2018   Procedure: BIOPSY;  Surgeon: RDaneil Dolin MD;  [facility]: [facility];  Service: Endoscopy;;  colon   . CHOLECYSTECTOMY  05/19/2011   Procedure: LAPAROSCOPIC CHOLECYSTECTOMY;  Surgeon: MJamesetta So  Location: [facility];  Service: General;  Laterality: N/A;  . COLONOSCOPY  11/2019   Dr. MRush Landmark  rectal ulceration, TI and IC valve appeared normal, 8 mm polyp in proximal ascending colon that was sessile. Inflammation in transverse colon to cecum, inflammation with altered vascularity, edema, erosions, erythema, friability, shallow ulcerations in a continuous and circumferential pattern from rectum to splenix flexure, single twenty mm ulcer in distal rectum. Small and large-  . COLONOSCOPY WITH PROPOFOL N/A 09/02/2018   Dr. RGala Romney Proctocolitis limited to the left side with some  exudate versus partial pseudomembrane formation, erosions and ulceration somewhat superficial.  Terminal ileum normal.  Diverticulosis.  Descending/sigmoid colon biopsy showing severe active colitis, diagnostic features of IBD not identified.  Differential includes infection, ischemia.  Rectal biopsies with granulation tissue c/w ulcer  . CYSTOSCOPY W/ URETERAL STENT PLACEMENT Right 05/08/2019   Procedure: CYSTOSCOPY WITH RETROGRADE PYELOGRAM/URETERAL STENT PLACEMENT;  Surgeon: EFestus Aloe MD;  Location: [facility];  Service: Urology;  Laterality: Right;  . CYSTOSCOPY WITH URETEROSCOPY Right 06/11/2019   Procedure: CYSTOSCOPY WITH DIAGNOSTIC URETEROSCOPY; URETERAL STENT REMOVAL;  Surgeon: MCleon Gustin MD;  Location: [facility];  Service: Urology;  Laterality: Right;  . HEMORRHOID SURGERY N/A 09/16/2019   Procedure: EXTENSIVE HEMORRHOIDECTOMY;  Surgeon: JAviva Signs MD;  Location: [facility];  Service: General;  Laterality: N/A;  . RECTAL BIOPSY N/A 09/16/2019   Procedure: BIOPSY RECTAL;  Surgeon: JAviva Signs MD;  Location: [facility];  Service: General;  Laterality: N/A;  . TUBAL LIGATION      Medications: I have reviewed the patient's current medications. Allergies:  Allergies  Allergen Reactions  . Tramadol     Says "blew my head up."    Family History  Problem Relation Age of Onset  . Cancer Father        mouth  . Heart disease Sister   . Heart disease Brother   . Cancer Brother        head  . Cancer Daughter        ? type  . Anesthesia problems Neg Hx   . Hypotension Neg Hx   . Malignant hyperthermia Neg  Hx   . Pseudochol deficiency Neg Hx   . Colon cancer Neg Hx   . Esophageal cancer Neg Hx   . Inflammatory bowel disease Neg Hx   . Liver disease Neg Hx   . Pancreatic cancer Neg Hx   . Rectal cancer Neg Hx   . Stomach cancer Neg Hx    Social History:  reports that she has never smoked. She has never used smokeless tobacco. She reports previous alcohol use. She reports that  she does not use drugs.  Review of Systems  Constitutional: Positive for fever.  Gastrointestinal: Positive for nausea and vomiting.  Genitourinary: Positive for flank pain.  All other systems reviewed and are negative.   Physical Exam:  Vital signs in last 24 hours: Temp:  [98.4 F (36.9 C)-99 F (37.2 C)] 99 F (37.2 C) (10/11 1500) Pulse Rate:  [101-113] 101 (10/11 1500) Resp:  [18-30] 20 (10/11 1500) BP: (81-141)/(41-68) 100/47 (10/11 1500) SpO2:  [92 %-100 %] 99 % (10/11 1500) Weight:  [73.9 kg] 73.9 kg (10/11 0936) Physical Exam Vitals and nursing note reviewed.  Constitutional:      Appearance: Normal appearance.  HENT:     Head: Normocephalic and atraumatic.     Nose: Nose normal.  Eyes:     Extraocular Movements: Extraocular movements intact.     Pupils: Pupils are equal, round, and reactive to light.  Cardiovascular:     Rate and Rhythm: Normal rate and regular rhythm.  Pulmonary:     Effort: Pulmonary effort is normal. No respiratory distress.  Abdominal:     General: Abdomen is flat. There is no distension.  Musculoskeletal:        General: No swelling. Normal range of motion.     Cervical back: Normal range of motion and neck supple.  Skin:    General: Skin is warm and dry.  Neurological:     General: No focal deficit present.     Mental Status: She is alert and oriented to person, place, and time.  Psychiatric:        Mood and Affect: Mood normal.        Behavior: Behavior normal.        Thought Content: Thought content normal.        Judgment: Judgment normal.     Laboratory Data:  Results for orders placed or performed during the hospital encounter of 05/17/20 (from the past 72 hour(s))  Comprehensive metabolic panel     Status: Abnormal   Collection Time: 05/17/20 10:15 AM  Result Value Ref Range   Sodium 136 135 - 145 mmol/L   Potassium 3.9 3.5 - 5.1 mmol/L   Chloride 103 98 - 111 mmol/L   CO2 20 (L) 22 - 32 mmol/L   Glucose, Bld 116 (H)  70 - 99 mg/dL    Comment: Glucose reference range applies only to samples taken after fasting for at least 8 hours.   BUN 45 (H) 8 - 23 mg/dL   Creatinine, Ser 4.33 (H) 0.44 - 1.00 mg/dL   Calcium 7.9 (L) 8.9 - 10.3 mg/dL   Total Protein 5.9 (L) 6.5 - 8.1 g/dL   Albumin 2.8 (L) 3.5 - 5.0 g/dL   AST 32 15 - 41 U/L   ALT 32 0 - 44 U/L   Alkaline Phosphatase 130 (H) 38 - 126 U/L   Total Bilirubin 0.8 0.3 - 1.2 mg/dL   GFR, Estimated 9 (L) >60 mL/min   Anion gap 13 5 -  15    Comment: Performed at [facility], [address] [place], [place] [address]  CBC     Status: Abnormal   Collection Time: 05/17/20 10:15 AM  Result Value Ref Range   WBC 36.3 (H) 4.0 - 10.5 K/uL   RBC 4.76 3.87 - 5.11 MIL/uL   Hemoglobin 10.8 (L) 12.0 - 15.0 g/dL   HCT 36.1 36 - 46 %   MCV 75.8 (L) 80.0 - 100.0 fL   MCH 22.7 (L) 26.0 - 34.0 pg   MCHC 29.9 (L) 30.0 - 36.0 g/dL   RDW 19.5 (H) 11.5 - 15.5 %   Platelets 215 150 - 400 K/uL   nRBC 0.0 0.0 - 0.2 %    Comment: Performed at [facility], [address], [place], [place] [address]  Type and screen [facility]     Status: None   Collection Time: 05/17/20 10:15 AM  Result Value Ref Range   ABO/RH(D) O POS    Antibody Screen NEG    Sample Expiration      05/20/2020,2359 Performed at [facility], [address], [place], [place] [address]   Respiratory Panel by RT PCR (Flu A&B, Covid) - Nasopharyngeal Swab     Status: None   Collection Time: 05/17/20 11:21 AM   Specimen: Nasopharyngeal Swab  Result Value Ref Range   SARS Coronavirus 2 by RT PCR NEGATIVE NEGATIVE    Comment: (NOTE) SARS-CoV-2 target nucleic acids are NOT DETECTED.  The SARS-CoV-2 RNA is generally detectable in upper respiratoy specimens during the acute phase of infection. The lowest concentration of SARS-CoV-2 viral copies this assay can detect is 131 copies/mL. A negative result does not preclude SARS-Cov-2 infection and should not be used as the sole basis for treatment  or other patient management decisions. A negative result may occur with  improper specimen collection/handling, submission of specimen other than nasopharyngeal swab, presence of viral mutation(s) within the areas targeted by this assay, and inadequate number of viral copies (<131 copies/mL). A negative result must be combined with clinical observations, patient history, and epidemiological information. The expected result is Negative.  Fact Sheet for Patients:  PinkCheek.be  Fact Sheet for Healthcare Providers:  GravelBags.it  This test is no t yet approved or cleared by the [place] FDA and  has been authorized for detection and/or diagnosis of SARS-CoV-2 by FDA under an Emergency Use Authorization (EUA). This EUA will remain  in effect (meaning this test can be used) for the duration of the COVID-19 declaration under Section 564(b)(1) of the Act, 21 U.S.C. section 360bbb-3(b)(1), unless the authorization is terminated or revoked sooner.     Influenza A by PCR NEGATIVE NEGATIVE   Influenza B by PCR NEGATIVE NEGATIVE    Comment: (NOTE) The Xpert Xpress SARS-CoV-2/FLU/RSV assay is intended as an aid in  the diagnosis of influenza from Nasopharyngeal swab specimens and  should not be used as a sole basis for treatment. Nasal washings and  aspirates are unacceptable for Xpert Xpress SARS-CoV-2/FLU/RSV  testing.  Fact Sheet for Patients: PinkCheek.be  Fact Sheet for Healthcare Providers: GravelBags.it  This test is not yet approved or cleared by the [place] FDA and  has been authorized for detection and/or diagnosis of SARS-CoV-2 by  FDA under an Emergency Use Authorization (EUA). This EUA will remain  in effect (meaning this test can be used) for the duration of the  Covid-19 declaration under Section 564(b)(1) of the Act, 21  U.S.C. section  360bbb-3(b)(1), unless  the authorization is  terminated or revoked. Performed at [facility], [address], [place], [place] [address]   Lactic acid, plasma     Status: None   Collection Time: 05/17/20  1:38 PM  Result Value Ref Range   Lactic Acid, Venous 1.9 0.5 - 1.9 mmol/L    Comment: Performed at [facility], [address] [place], [place] [address]  Procalcitonin - Baseline     Status: None   Collection Time: 05/17/20  1:38 PM  Result Value Ref Range   Procalcitonin >150.00 ng/mL    Comment:        Interpretation: PCT >= 10 ng/mL: Important systemic inflammatory response, almost exclusively due to severe bacterial sepsis or septic shock. (NOTE)       Sepsis PCT Algorithm           Lower Respiratory Tract                                      Infection PCT Algorithm    ----------------------------     ----------------------------         PCT < 0.25 ng/mL                PCT < 0.10 ng/mL          Strongly encourage             Strongly discourage   discontinuation of antibiotics    initiation of antibiotics    ----------------------------     -----------------------------       PCT 0.25 - 0.50 ng/mL            PCT 0.10 - 0.25 ng/mL               OR       >80% decrease in PCT            Discourage initiation of                                            antibiotics      Encourage discontinuation           of antibiotics    ----------------------------     -----------------------------         PCT >= 0.50 ng/mL              PCT 0.26 - 0.50 ng/mL                AND       <80% decrease in PCT             Encourage initiation of                                             antibiotics       Encourage continuation           of antibiotics    ----------------------------     -----------------------------        PCT >= 0.50 ng/mL                  PCT > 0.50 ng/mL  AND         increase in PCT                  Strongly encourage                                       initiation of antibiotics    Strongly encourage escalation           of antibiotics                                     -----------------------------                                           PCT <= 0.25 ng/mL                                                 OR                                        > 80% decrease in PCT                                      Discontinue / Do not initiate                                             antibiotics  Performed at [facility], [address], [place], [place] [address]   Protime-INR     Status: Abnormal   Collection Time: 05/17/20  2:48 PM  Result Value Ref Range   Prothrombin Time 16.6 (H) 11.4 - 15.2 seconds   INR 1.4 (H) 0.8 - 1.2    Comment: (NOTE) INR goal varies based on device and disease states. Performed at [facility], [address] [place], [place] [address]   APTT     Status: Abnormal   Collection Time: 05/17/20  2:48 PM  Result Value Ref Range   aPTT 42 (H) 24 - 36 seconds    Comment:        IF BASELINE aPTT IS ELEVATED, SUGGEST PATIENT RISK ASSESSMENT BE USED TO DETERMINE APPROPRIATE ANTICOAGULANT THERAPY. Performed at [facility], [address], [place], [place] [address]   Lactic acid, plasma     Status: None   Collection Time: 05/17/20  2:49 PM  Result Value Ref Range   Lactic Acid, Venous 1.5 0.5 - 1.9 mmol/L    Comment: Performed at [facility], [address], [place], [place] [address]   Recent Results (from the past 240 hour(s))  Respiratory Panel by RT PCR (Flu A&B, Covid) - Nasopharyngeal Swab     Status: None   Collection Time: 05/17/20 11:21 AM   Specimen: Nasopharyngeal Swab  Result Value Ref Range Status   SARS Coronavirus 2 by RT  PCR NEGATIVE NEGATIVE Final    Comment: (NOTE) SARS-CoV-2 target nucleic acids are NOT DETECTED.  The SARS-CoV-2 RNA is generally detectable in upper respiratoy specimens during the acute phase of infection. The lowest concentration of SARS-CoV-2 viral copies this  assay can detect is 131 copies/mL. A negative result does not preclude SARS-Cov-2 infection and should not be used as the sole basis for treatment or other patient management decisions. A negative result may occur with  improper specimen collection/handling, submission of specimen other than nasopharyngeal swab, presence of viral mutation(s) within the areas targeted by this assay, and inadequate number of viral copies (<131 copies/mL). A negative result must be combined with clinical observations, patient history, and epidemiological information. The expected result is Negative.  Fact Sheet for Patients:  PinkCheek.be  Fact Sheet for Healthcare Providers:  GravelBags.it  This test is no t yet approved or cleared by the [place] FDA and  has been authorized for detection and/or diagnosis of SARS-CoV-2 by FDA under an Emergency Use Authorization (EUA). This EUA will remain  in effect (meaning this test can be used) for the duration of the COVID-19 declaration under Section 564(b)(1) of the Act, 21 U.S.C. section 360bbb-3(b)(1), unless the authorization is terminated or revoked sooner.     Influenza A by PCR NEGATIVE NEGATIVE Final   Influenza B by PCR NEGATIVE NEGATIVE Final    Comment: (NOTE) The Xpert Xpress SARS-CoV-2/FLU/RSV assay is intended as an aid in  the diagnosis of influenza from Nasopharyngeal swab specimens and  should not be used as a sole basis for treatment. Nasal washings and  aspirates are unacceptable for Xpert Xpress SARS-CoV-2/FLU/RSV  testing.  Fact Sheet for Patients: PinkCheek.be  Fact Sheet for Healthcare Providers: GravelBags.it  This test is not yet approved or cleared by the [place] FDA and  has been authorized for detection and/or diagnosis of SARS-CoV-2 by  FDA under an Emergency Use Authorization (EUA). This EUA will  remain  in effect (meaning this test can be used) for the duration of the  Covid-19 declaration under Section 564(b)(1) of the Act, 21  U.S.C. section 360bbb-3(b)(1), unless the authorization is  terminated or revoked. Performed at [facility], [address] [place], [place] [address]    Creatinine: Recent Labs    05/17/20 1015  CREATININE 4.33*   Baseline Creatinine: 0.97  Impression/Assessment:  74yo with a left ureteral calculus, ARF, sepsis from a urinary source  Plan:  -We discussed the management of kidney stones. These options include observation, ureteroscopy, shockwave lithotripsy (ESWL) and percutaneous nephrolithotomy (PCNL). We discussed which options are relevant to the patient's stone(s). We discussed the natural history of kidney stones as well as the complications of untreated stones and the impact on quality of life without treatment as well as with each of the above listed treatments. We also discussed the efficacy of each treatment in its ability to clear the stone burden. With any of these management options I discussed the signs and symptoms of infection and the need for emergent treatment should these be experienced. For each option we discussed the ability of each procedure to clear the patient of their stone burden.   For observation I described the risks which include but are not limited to silent renal damage, life-threatening infection, need for emergent surgery, failure to pass stone and pain.   For ureteroscopy I described the risks which include bleeding, infection, damage to contiguous structures, positioning injury, ureteral stricture, ureteral avulsion, ureteral injury, need for prolonged ureteral  stent, inability to perform ureteroscopy, need for an interval procedure, inability to clear stone burden, stent discomfort/pain, heart attack, stroke, pulmonary embolus and the inherent risks with general anesthesia.   For shockwave lithotripsy I described the  risks which include arrhythmia, kidney contusion, kidney hemorrhage, need for transfusion, pain, inability to adequately break up stone, inability to pass stone fragments, Steinstrasse, infection associated with obstructing stones, need for alternate surgical procedure, need for repeat shockwave lithotripsy, MI, CVA, PE and the inherent risks with anesthesia/conscious sedation.   For PCNL I described the risks including positioning injury, pneumothorax, hydrothorax, need for chest tube, inability to clear stone burden, renal laceration, arterial venous fistula or malformation, need for embolization of kidney, loss of kidney or renal function, need for repeat procedure, need for prolonged nephrostomy tube, ureteral avulsion, MI, CVA, PE and the inherent risks of general anesthesia.   - The patient would like to proceed with Left ureteral stent placement  Nicolette Bang 05/17/2020, 4:44 PM

## 2020-05-17 NOTE — ED Notes (Signed)
Holding on the occult blood at this time

## 2020-05-17 NOTE — Consult Note (Signed)
Referring Provider: Dr. Carles Collet  Primary Care Physician:  [name], MD Primary Gastroenterologist:  Dr. Valarie Merino Mansouraty ([facility])  Date of Admission: 05/17/20 Date of Consultation: 05/17/20  Reason for Consultation:  IBD, diarrhea  HPI:  Rhonda Bailey is a 74 y.o. year old female with history of IDA, IBD diagnosed earlier this year and felt to be Crohn's colitis vs pan ulcerative colitis, followed by Dr. [name] with [facility]. She is currently on Humira, Imuran, and Lialda, which was recently decreased to 2.4 grams daily at Sept 17th, 2021 visit. Presented with diarrhea, hypotension noted in the field with SBP in the 70s per EMS, low back pain. Notable leukocytosis with WBC count 36.3 on admission. Acute renal injury with Creatinine 4.33. Stool studies have been ordered but not collected yet. CT abd/pelvis ordered. COVID negative. Blood cultures in process. Cdiff PCR negative 2 weeks ago.   Diarrhea started 5 days ago. Has been on prednisone taper, now down to 5 mg with 3 days left. No rectal bleeding. Abdomen feels sore. +Flatus. Last night 6-8 loose stools. Stool this morning around 0600. Malodorous. No recent antibiotics she is aware. N/V started about 3 days ago. Poor appetite. Ate a honeybun and drank coffee yesterday. Little fluid intake. No fever but had chills. Lives alone. Felt fatigued, weak, couldn't get out of bed this morning. Called EMS. Next Humira dose due Saturday. Baseline bowel habits had gotten down to 2-3 per day prior to this flare of diarrhea.   Last colonoscopy April 2021: rectal ulceration, TI and IC valve appeared normal, 8 mm polyp in proximal ascending colon that was sessile. Inflammation in transverse colon to cecum, inflammation with altered vascularity, edema, erosions, erythema, friability, shallow ulcerations in a continuous and circumferential pattern from rectum to splenic flexure, single twenty mm ulcer in distal rectum. Small and large-mouthed diverticula.  Hemorrhoids.     Past Medical History:  Diagnosis Date  . Anal fissure   . Anxiety   . Anxiety disorder, unspecified   . Essential (primary) hypertension   . Gastro-esophageal reflux disease with esophagitis   . GERD (gastroesophageal reflux disease)   . Hypokalemia   . Hypothyroidism   . Noninfective gastroenteritis and colitis, unspecified   . Sleep apnea     Past Surgical History:  Procedure Laterality Date  . BIOPSY  09/02/2018   Procedure: BIOPSY;  Surgeon: Daneil Dolin, MD;  Location: [facility];  Service: Endoscopy;;  colon   . CHOLECYSTECTOMY  05/19/2011   Procedure: LAPAROSCOPIC CHOLECYSTECTOMY;  Surgeon: Jamesetta So;  Location: [facility];  Service: General;  Laterality: N/A;  . COLONOSCOPY  11/2019   Dr. [name]:  rectal ulceration, TI and IC valve appeared normal, 8 mm polyp in proximal ascending colon that was sessile. Inflammation in transverse colon to cecum, inflammation with altered vascularity, edema, erosions, erythema, friability, shallow ulcerations in a continuous and circumferential pattern from rectum to splenix flexure, single twenty mm ulcer in distal rectum. Small and large-  . COLONOSCOPY WITH PROPOFOL N/A 09/02/2018   Dr. Gala Romney: Proctocolitis limited to the left side with some exudate versus partial pseudomembrane formation, erosions and ulceration somewhat superficial.  Terminal ileum normal.  Diverticulosis.  Descending/sigmoid colon biopsy showing severe active colitis, diagnostic features of IBD not identified.  Differential includes infection, ischemia.  Rectal biopsies with granulation tissue c/w ulcer  . CYSTOSCOPY W/ URETERAL STENT PLACEMENT Right 05/08/2019   Procedure: CYSTOSCOPY WITH RETROGRADE PYELOGRAM/URETERAL STENT PLACEMENT;  Surgeon: Festus Aloe, MD;  Location: [facility];  Service: Urology;  Laterality: Right;  . CYSTOSCOPY WITH URETEROSCOPY Right 06/11/2019   Procedure: CYSTOSCOPY WITH DIAGNOSTIC URETEROSCOPY; URETERAL STENT  REMOVAL;  Surgeon: Cleon Gustin, MD;  Location: [facility];  Service: Urology;  Laterality: Right;  . HEMORRHOID SURGERY N/A 09/16/2019   Procedure: EXTENSIVE HEMORRHOIDECTOMY;  Surgeon: Aviva Signs, MD;  Location: [facility];  Service: General;  Laterality: N/A;  . RECTAL BIOPSY N/A 09/16/2019   Procedure: BIOPSY RECTAL;  Surgeon: Aviva Signs, MD;  Location: [facility];  Service: General;  Laterality: N/A;  . TUBAL LIGATION      Prior to Admission medications   Medication Sig Start Date End Date Taking? Authorizing Provider  acidophilus (RISAQUAD) CAPS capsule Take 1 capsule by mouth daily.    [provider]  Adalimumab (HUMIRA PEN) 40 MG/0.4ML PNKT Inject 40 mg into the skin every 14 (fourteen) days. 12/11/19   Mansouraty, Telford Nab., MD  ALPRAZolam Duanne Moron) 0.5 MG tablet Take 0.5 mg by mouth at bedtime as needed for sleep.    [provider]  amitriptyline (ELAVIL) 10 MG tablet Take 10 mg by mouth daily.  11/04/19   [provider]  ANECREAM5 5 % CREA APPLY TO ANUS EVERY 8 HOURS AS NEEDED FOR PAIN Patient taking differently: Place 1 application rectally every 8 (eight) hours as needed (pain).  11/05/19   Aviva Signs, MD  azaTHIOprine (IMURAN) 50 MG tablet Take 1 tablet (50 mg total) by mouth daily. 12/11/19   Mansouraty, Telford Nab., MD  dicyclomine (BENTYL) 10 MG capsule Take 1 capsule (10 mg total) by mouth 4 (four) times daily -  before meals and at bedtime. 04/30/20   Mansouraty, Telford Nab., MD  ferrous gluconate (FERGON) 324 MG tablet TAKE 1 TABLET BY MOUTH DAILY WITH BREAKFAST 04/09/20   Mansouraty, Telford Nab., MD  HYDROCORTISONE ACE, RECTAL, 30 MG SUPP Place 1 suppository (30 mg total) rectally in the morning and at bedtime. 10/29/19   Esterwood, Amy S, PA-C  levothyroxine (SYNTHROID, LEVOTHROID) 25 MCG tablet Take 25 mcg by mouth daily before breakfast.     [provider]  mesalamine (LIALDA) 1.2 g EC tablet TAKE 4 TABLETS BY MOUTH DAILY WITH BREAKFAST.  03/30/20   Mansouraty, Telford Nab., MD  ondansetron (ZOFRAN ODT) 8 MG disintegrating tablet Take 1 tablet (8 mg total) by mouth every 8 (eight) hours as needed for nausea or vomiting. 10/16/19   Barb Merino, MD  pantoprazole (PROTONIX) 40 MG tablet Take 40 mg by mouth at bedtime.  08/17/18   [provider]  predniSONE (DELTASONE) 5 MG tablet Start Taper - 03/27/20- 67m daily thru- 04/20/20-10 mg daily, thur- 04/24/20- 5103mdaily, thru- 05/08/20-0 03/12/20   Mansouraty, GaTelford Nab MD  traZODone (DESYREL) 50 MG tablet Take 50 mg by mouth at bedtime as needed for sleep.  08/25/18   [provider]    Current Facility-Administered Medications  Medication Dose Route Frequency Provider Last Rate Last Admin  . 0.9 %  sodium chloride infusion  1,000 mL Intravenous Continuous ShTruddie HiddenMD 125 mL/hr at 05/17/20 1112 1,000 mL at 05/17/20 1112  . acetaminophen (TYLENOL) tablet 650 mg  650 mg Oral Q6H PRN TaOrson EvaMD   650 mg at 05/17/20 1319   Or  . acetaminophen (TYLENOL) suppository 650 mg  650 mg Rectal Q6H PRN Tat, DaShanon BrowMD      . Adalimumab PNKT 40 mg  40 mg Subcutaneous Q14 Days Tat, DaShanon BrowMD      . ALPRAZolam (  XANAX) tablet 0.5 mg  0.5 mg Oral QHS PRN Tat, David, MD      . heparin injection 5,000 Units  5,000 Units Subcutaneous Franco Collet, MD   5,000 Units at 05/17/20 1320  . iohexol (OMNIPAQUE) 9 MG/ML oral solution           . [START ON 05/18/2020] levothyroxine (SYNTHROID) tablet 37.5 mcg  37.5 mcg Oral QAC breakfast Tat, David, MD      . Derrill Memo ON 05/18/2020] mesalamine (LIALDA) EC tablet 2.4 g  2.4 g Oral Q breakfast Tat, David, MD      . ondansetron [facility]) injection 4 mg  4 mg Intravenous Q6H Orson Eva, MD   4 mg at 05/17/20 1226  . pantoprazole (PROTONIX) injection 40 mg  40 mg Intravenous Therisa Doyne, MD   40 mg at 05/17/20 1320  . promethazine (PHENERGAN) injection 12.5 mg  12.5 mg Intravenous Q6H PRN Tat, David, MD      . traZODone (DESYREL) tablet  50 mg  50 mg Oral QHS PRN Tat, David, MD       Current Outpatient Medications  Medication Sig Dispense Refill  . acidophilus (RISAQUAD) CAPS capsule Take 1 capsule by mouth daily.    . Adalimumab (HUMIRA PEN) 40 MG/0.4ML PNKT Inject 40 mg into the skin every 14 (fourteen) days. 2 each 11  . ALPRAZolam (XANAX) 0.5 MG tablet Take 0.5 mg by mouth at bedtime as needed for sleep.    Marland Kitchen amitriptyline (ELAVIL) 10 MG tablet Take 10 mg by mouth daily.     . ANECREAM5 5 % CREA APPLY TO ANUS EVERY 8 HOURS AS NEEDED FOR PAIN (Patient taking differently: Place 1 application rectally every 8 (eight) hours as needed (pain). ) 30 g 1  . azaTHIOprine (IMURAN) 50 MG tablet Take 1 tablet (50 mg total) by mouth daily. 90 tablet 3  . dicyclomine (BENTYL) 10 MG capsule Take 1 capsule (10 mg total) by mouth 4 (four) times daily -  before meals and at bedtime. 90 capsule 0  . ferrous gluconate (FERGON) 324 MG tablet TAKE 1 TABLET BY MOUTH DAILY WITH BREAKFAST 90 tablet 1  . HYDROCORTISONE ACE, RECTAL, 30 MG SUPP Place 1 suppository (30 mg total) rectally in the morning and at bedtime. 12 suppository 3  . levothyroxine (SYNTHROID, LEVOTHROID) 25 MCG tablet Take 25 mcg by mouth daily before breakfast.     . mesalamine (LIALDA) 1.2 g EC tablet TAKE 4 TABLETS BY MOUTH DAILY WITH BREAKFAST. 120 tablet 2  . ondansetron (ZOFRAN ODT) 8 MG disintegrating tablet Take 1 tablet (8 mg total) by mouth every 8 (eight) hours as needed for nausea or vomiting. 20 tablet 0  . pantoprazole (PROTONIX) 40 MG tablet Take 40 mg by mouth at bedtime.     . predniSONE (DELTASONE) 5 MG tablet Start Taper - 03/27/20- 71m daily thru- 04/20/20-10 mg daily, thur- 04/24/20- 514mdaily, thru- 05/08/20-0 100 tablet 0  . traZODone (DESYREL) 50 MG tablet Take 50 mg by mouth at bedtime as needed for sleep.       Allergies as of 05/17/2020 - Review Complete 05/17/2020  Allergen Reaction Noted  . Tramadol  05/08/2019    Family History  Problem Relation Age  of Onset  . Cancer Father        mouth  . Heart disease Sister   . Heart disease Brother   . Cancer Brother        head  . Cancer Daughter        ?  type  . Anesthesia problems Neg Hx   . Hypotension Neg Hx   . Malignant hyperthermia Neg Hx   . Pseudochol deficiency Neg Hx   . Colon cancer Neg Hx   . Esophageal cancer Neg Hx   . Inflammatory bowel disease Neg Hx   . Liver disease Neg Hx   . Pancreatic cancer Neg Hx   . Rectal cancer Neg Hx   . Stomach cancer Neg Hx     Social History   Socioeconomic History  . Marital status: Divorced    Spouse name: Not on file  . Number of children: 2  . Years of education: Not on file  . Highest education level: Not on file  Occupational History  . Occupation: retired  Tobacco Use  . Smoking status: Never Smoker  . Smokeless tobacco: Never Used  Vaping Use  . Vaping Use: Never used  Substance and Sexual Activity  . Alcohol use: Not Currently    Comment: once a year  . Drug use: No  . Sexual activity: Not Currently  Other Topics Concern  . Not on file  Social History Narrative  . Not on file   Social Determinants of Health   Financial Resource Strain:   . Difficulty of Paying Living Expenses: Not on file  Food Insecurity:   . Worried About Charity fundraiser in the Last Year: Not on file  . Ran Out of Food in the Last Year: Not on file  Transportation Needs:   . Lack of Transportation (Medical): Not on file  . Lack of Transportation (Non-Medical): Not on file  Physical Activity:   . Days of Exercise per Week: Not on file  . Minutes of Exercise per Session: Not on file  Stress:   . Feeling of Stress : Not on file  Social Connections:   . Frequency of Communication with Friends and Family: Not on file  . Frequency of Social Gatherings with Friends and Family: Not on file  . Attends Religious Services: Not on file  . Active Member of Clubs or Organizations: Not on file  . Attends Archivist Meetings: Not on  file  . Marital Status: Not on file  Intimate Partner Violence:   . Fear of Current or Ex-Partner: Not on file  . Emotionally Abused: Not on file  . Physically Abused: Not on file  . Sexually Abused: Not on file    Review of Systems: Gen: see HPI CV: Denies chest pain, heart palpitations, syncope, edema  Resp: Denies shortness of breath with rest, cough, wheezing GI: see HPI GU : Denies urinary burning, urinary frequency, urinary incontinence.  MS: Denies joint pain,swelling, cramping Derm: Denies rash, itching, dry skin Psych: Denies depression, anxiety,confusion, or memory loss Heme: see HPI  Physical Exam: Vital signs in last 24 hours: Temp:  [98.4 F (36.9 C)] 98.4 F (36.9 C) (10/11 0937) Pulse Rate:  [109-113] 111 (10/11 1130) Resp:  [18-30] 27 (10/11 1330) BP: (81-141)/(41-68) 141/65 (10/11 1330) SpO2:  [92 %-100 %] 92 % (10/11 1130) Weight:  [73.9 kg] 73.9 kg (10/11 0936)   General:   Alert, acutely ill-appearing Head:  Normocephalic and atraumatic. Eyes:  Sclera clear, no icterus.   Conjunctiva pink. Ears:  Hard of hearing Lungs:  Scattered rhonchi, non-labored breathing Heart:  S1 S2 present Abdomen:  +BS, Soft, TTP diffusely across abdomen Rectal:  Deferred until time of colonoscopy.   Extremities:  Without edema. Neurologic:  Alert and  oriented x4 Psych:  Alert and cooperative. Normal mood and affect.  Intake/Output from previous day: No intake/output data recorded. Intake/Output this shift: Total I/O In: 1898.4 [IV Piggyback:1898.4] Out: -   Lab Results: Recent Labs    05/17/20 1015  WBC 36.3*  HGB 10.8*  HCT 36.1  PLT 215   BMET Recent Labs    05/17/20 1015  NA 136  K 3.9  CL 103  CO2 20*  GLUCOSE 116*  BUN 45*  CREATININE 4.33*  CALCIUM 7.9*   LFT Recent Labs    05/17/20 1015  PROT 5.9*  ALBUMIN 2.8*  AST 32  ALT 32  ALKPHOS 130*  BILITOT 0.8   Studies/Results: DG CHEST PORT 1 VIEW  Result Date: 05/17/2020 CLINICAL  DATA:  Weakness. EXAM: PORTABLE CHEST 1 VIEW COMPARISON:  October 14, 2019. FINDINGS: The heart size and mediastinal contours are within normal limits. Both lungs are clear. The visualized skeletal structures are unremarkable. IMPRESSION: No active disease. Electronically Signed   By: Marijo Conception M.D.   On: 05/17/2020 13:00    Impression: Pleasant 74 year old female with newly diagnosed IBD earlier this year (Crohn's colitis vs pan ulcerative colitis) on Humira every 2 weeks, Imuran, Lialda 2.4 grams daily (recently decreased a few weeks ago), nearing end of prednisone taper, now with acute diarrhea for past 5 days and presenting to the ED with sepsis. Hypotensive in the field with SBP in the 70s, now improved with fluid resuscitation but remains mildly tachycardic, weak, and notable leukocytosis. Concern is high for infectious process such as Cdiff in light of clinical picture.   Stool studies have been requested but not collected yet as she has not had stool output since this morning. CT abd/pelvis to be completed shortly. COVID negative.  Will await CT findings, but with clinical presentation of hypotension, leukocytosis, diarrhea,  anticipate starting empiric high dose vancomycin and IV Flagyl while awaiting stool studies to result. Clinically does not appear to have an ileus or signs/symptoms concerning for megacolon.   Plan: CT abd/pelvis as ordered Continue IV fluids for hydration Agree with stool studies Next Humira dose Saturday Blood cultures pending Further recommendations after CT completed.   Annitta Needs, PhD, ANP-BC [facility]      LOS: 0 days    05/17/2020, 2:16 PM

## 2020-05-17 NOTE — Progress Notes (Signed)
Report called to receiving nurse at [facility]

## 2020-05-17 NOTE — ED Notes (Signed)
EDP at bedside  

## 2020-05-17 NOTE — Progress Notes (Signed)
Patient being transferred to [facility] long. [facility] called to set up transport. Awaiting [facility] truck.

## 2020-05-17 NOTE — Transfer of Care (Signed)
Immediate Anesthesia Transfer of Care Note  Patient: Rhonda Bailey  Procedure(s) Performed: CYSTOSCOPY WITH RETROGRADE PYELOGRAM/URETERAL STENT PLACEMENT (Left )  Patient Location: PACU  Anesthesia Type:General  Level of Consciousness: drowsy and patient cooperative  Airway & Oxygen Therapy: Patient Spontanous Breathing and Patient connected to face mask oxygen  Post-op Assessment: Report given to RN and Post -op Vital signs reviewed and stable  Post vital signs: Reviewed and stable  Last Vitals:  Vitals Value Taken Time  BP 110/75 05/17/20 2125  Temp 36.8 C 05/17/20 2123  Pulse 112 05/17/20 2123  Resp 29 05/17/20 2130  SpO2    Vitals shown include unvalidated device data.  Last Pain:  Vitals:   05/17/20 1500  TempSrc: Oral  PainSc:          Complications: No complications documented.

## 2020-05-17 NOTE — ED Provider Notes (Signed)
[facility] EMERGENCY DEPARTMENT Provider Note  CSN: [id number] Arrival date & time: 05/17/20 9935    History Chief Complaint  Patient presents with  . Weakness    HPI  Rhonda Bailey is a 74 y.o. female with relatively recent diagnosis of IBD, UC vs Crohn's treated by [facility] has been on Lialda and Humira as well as prednisone for several weeks. She reports she has been 'feeling bad' for about 2 weeks but unable to further describe symptoms until about a week ago she reports she has had frequent loose stools and vomiting when she tries to eat. She was feeling very weak this AM and called EMS who report her BP was in the 70s, improved with IVF enroute but still low on arrival here. She reports her stools have been dark, she is taking iron supplements. She denies fever, no significant abdominal pain, she has had low back pain, treated by PCP with flexeril and hydrocodone about 10 days ago.   Past Medical History:  Diagnosis Date  . Anal fissure   . Anxiety   . Anxiety disorder, unspecified   . Essential (primary) hypertension   . Gastro-esophageal reflux disease with esophagitis   . GERD (gastroesophageal reflux disease)   . Hypokalemia   . Hypothyroidism   . Noninfective gastroenteritis and colitis, unspecified   . Sleep apnea     Past Surgical History:  Procedure Laterality Date  . BIOPSY  09/02/2018   Procedure: BIOPSY;  Surgeon: Daneil Dolin, MD;  Location: [facility];  Service: Endoscopy;;  colon   . CHOLECYSTECTOMY  05/19/2011   Procedure: LAPAROSCOPIC CHOLECYSTECTOMY;  Surgeon: Jamesetta So;  Location: [facility];  Service: General;  Laterality: N/A;  . COLONOSCOPY WITH PROPOFOL N/A 09/02/2018   Dr. Gala Romney: Proctocolitis limited to the left side with some exudate versus partial pseudomembrane formation, erosions and ulceration somewhat superficial.  Terminal ileum normal.  Diverticulosis.  Descending/sigmoid colon biopsy showing severe active colitis, diagnostic  features of IBD not identified.  Differential includes infection, ischemia.  Rectal biopsies with granulation tissue c/w ulcer  . CYSTOSCOPY W/ URETERAL STENT PLACEMENT Right 05/08/2019   Procedure: CYSTOSCOPY WITH RETROGRADE PYELOGRAM/URETERAL STENT PLACEMENT;  Surgeon: Festus Aloe, MD;  Location: [facility];  Service: Urology;  Laterality: Right;  . CYSTOSCOPY WITH URETEROSCOPY Right 06/11/2019   Procedure: CYSTOSCOPY WITH DIAGNOSTIC URETEROSCOPY; URETERAL STENT REMOVAL;  Surgeon: Cleon Gustin, MD;  Location: [facility];  Service: Urology;  Laterality: Right;  . HEMORRHOID SURGERY N/A 09/16/2019   Procedure: EXTENSIVE HEMORRHOIDECTOMY;  Surgeon: Aviva Signs, MD;  Location: [facility];  Service: General;  Laterality: N/A;  . RECTAL BIOPSY N/A 09/16/2019   Procedure: BIOPSY RECTAL;  Surgeon: Aviva Signs, MD;  Location: [facility];  Service: General;  Laterality: N/A;  . TUBAL LIGATION      Family History  Problem Relation Age of Onset  . Cancer Father        mouth  . Heart disease Sister   . Heart disease Brother   . Cancer Brother        head  . Cancer Daughter        ? type  . Anesthesia problems Neg Hx   . Hypotension Neg Hx   . Malignant hyperthermia Neg Hx   . Pseudochol deficiency Neg Hx   . Colon cancer Neg Hx   . Esophageal cancer Neg Hx   . Inflammatory bowel disease Neg Hx   . Liver disease Neg Hx   .  Pancreatic cancer Neg Hx   . Rectal cancer Neg Hx   . Stomach cancer Neg Hx     Social History   Tobacco Use  . Smoking status: Never Smoker  . Smokeless tobacco: Never Used  Vaping Use  . Vaping Use: Never used  Substance Use Topics  . Alcohol use: Not Currently    Comment: once a year  . Drug use: No     Home Medications Prior to Admission medications   Medication Sig Start Date End Date Taking? Authorizing Provider  acidophilus (RISAQUAD) CAPS capsule Take 1 capsule by mouth daily.    [provider]  Adalimumab (HUMIRA PEN) 40 MG/0.4ML PNKT Inject  40 mg into the skin every 14 (fourteen) days. 12/11/19   Mansouraty, Telford Nab., MD  ALPRAZolam Duanne Moron) 0.5 MG tablet Take 0.5 mg by mouth at bedtime as needed for sleep.    [provider]  amitriptyline (ELAVIL) 10 MG tablet Take 10 mg by mouth daily.  11/04/19   [provider]  ANECREAM5 5 % CREA APPLY TO ANUS EVERY 8 HOURS AS NEEDED FOR PAIN Patient taking differently: Place 1 application rectally every 8 (eight) hours as needed (pain).  11/05/19   Aviva Signs, MD  azaTHIOprine (IMURAN) 50 MG tablet Take 1 tablet (50 mg total) by mouth daily. 12/11/19   Mansouraty, Telford Nab., MD  dicyclomine (BENTYL) 10 MG capsule Take 1 capsule (10 mg total) by mouth 4 (four) times daily -  before meals and at bedtime. 04/30/20   Mansouraty, Telford Nab., MD  ferrous gluconate (FERGON) 324 MG tablet TAKE 1 TABLET BY MOUTH DAILY WITH BREAKFAST 04/09/20   Mansouraty, Telford Nab., MD  HYDROCORTISONE ACE, RECTAL, 30 MG SUPP Place 1 suppository (30 mg total) rectally in the morning and at bedtime. 10/29/19   Esterwood, Amy S, PA-C  levothyroxine (SYNTHROID, LEVOTHROID) 25 MCG tablet Take 25 mcg by mouth daily before breakfast.     [provider]  mesalamine (LIALDA) 1.2 g EC tablet TAKE 4 TABLETS BY MOUTH DAILY WITH BREAKFAST. 03/30/20   Mansouraty, Telford Nab., MD  ondansetron (ZOFRAN ODT) 8 MG disintegrating tablet Take 1 tablet (8 mg total) by mouth every 8 (eight) hours as needed for nausea or vomiting. 10/16/19   Barb Merino, MD  pantoprazole (PROTONIX) 40 MG tablet Take 40 mg by mouth at bedtime.  08/17/18   [provider]  predniSONE (DELTASONE) 5 MG tablet Start Taper - 03/27/20- 58m daily thru- 04/20/20-10 mg daily, thur- 04/24/20- 561mdaily, thru- 05/08/20-0 03/12/20   Mansouraty, GaTelford Nab MD  traZODone (DESYREL) 50 MG tablet Take 50 mg by mouth at bedtime as needed for sleep.  08/25/18   [provider]     Allergies    Tramadol   Review of Systems   Review of  Systems A comprehensive review of systems was completed and negative except as noted in HPI.    Physical Exam BP (!) 96/41   Pulse (!) 111   Temp 98.4 F (36.9 C) (Oral)   Resp (!) 23   Ht 5' 5"  (1.651 m)   Wt 73.9 kg   SpO2 92%   BMI 27.12 kg/m   Physical Exam Vitals and nursing note reviewed.  Constitutional:      Appearance: Normal appearance.  HENT:     Head: Normocephalic and atraumatic.     Nose: Nose normal.     Mouth/Throat:     Mouth: Mucous membranes are moist.  Eyes:  Extraocular Movements: Extraocular movements intact.     Conjunctiva/sclera: Conjunctivae normal.  Cardiovascular:     Rate and Rhythm: Tachycardia present.  Pulmonary:     Effort: Pulmonary effort is normal.     Breath sounds: Normal breath sounds.  Abdominal:     General: Abdomen is flat.     Palpations: Abdomen is soft.     Tenderness: There is no abdominal tenderness.  Musculoskeletal:        General: No swelling. Normal range of motion.     Cervical back: Neck supple.  Skin:    General: Skin is warm and dry.  Neurological:     General: No focal deficit present.     Mental Status: She is alert.  Psychiatric:        Mood and Affect: Mood normal.      ED Results / Procedures / Treatments   Labs (all labs ordered are listed, but only abnormal results are displayed) Labs Reviewed  COMPREHENSIVE METABOLIC PANEL - Abnormal; Notable for the following components:      Result Value   CO2 20 (*)    Glucose, Bld 116 (*)    BUN 45 (*)    Creatinine, Ser 4.33 (*)    Calcium 7.9 (*)    Total Protein 5.9 (*)    Albumin 2.8 (*)    Alkaline Phosphatase 130 (*)    GFR, Estimated 9 (*)    All other components within normal limits  CBC - Abnormal; Notable for the following components:   WBC 36.3 (*)    Hemoglobin 10.8 (*)    MCV 75.8 (*)    MCH 22.7 (*)    MCHC 29.9 (*)    RDW 19.5 (*)    All other components within normal limits  RESPIRATORY PANEL BY RT PCR (FLU A&B, COVID)   CULTURE, BLOOD (ROUTINE X 2)  CULTURE, BLOOD (ROUTINE X 2)  URINE CULTURE  C DIFFICILE QUICK SCREEN W PCR REFLEX  GASTROINTESTINAL PANEL BY PCR, STOOL (REPLACES STOOL CULTURE)  LACTIC ACID, PLASMA  LACTIC ACID, PLASMA  PROTIME-INR  APTT  PROCALCITONIN  URINALYSIS, ROUTINE W REFLEX MICROSCOPIC  POC OCCULT BLOOD, ED  TYPE AND SCREEN    EKG EKG Interpretation  Date/Time:  Monday May 17 2020 09:55:27 EDT Ventricular Rate:  110 PR Interval:    QRS Duration: 82 QT Interval:  376 QTC Calculation: 509 R Axis:   69 Text Interpretation: Sinus tachycardia Prolonged QT interval No significant change since last tracing Confirmed by Calvert Cantor [phone]) on 05/17/2020 9:58:16 AM   Radiology No results found.  Procedures Procedures  Medications Ordered in the ED Medications  0.9 %  sodium chloride infusion (1,000 mLs Intravenous New Bag/Given 05/17/20 1112)  iohexol (OMNIPAQUE) 9 MG/ML oral solution (has no administration in time range)  ondansetron (ZOFRAN) injection 4 mg (4 mg Intravenous Given 05/17/20 1226)  Adalimumab PNKT 40 mg (has no administration in time range)  ALPRAZolam (XANAX) tablet 0.5 mg (has no administration in time range)  traZODone (DESYREL) tablet 50 mg (has no administration in time range)  mesalamine (LIALDA) EC tablet 2.4 g (has no administration in time range)  levothyroxine (SYNTHROID) tablet 37.5 mcg (has no administration in time range)  pantoprazole (PROTONIX) injection 40 mg (has no administration in time range)  heparin injection 5,000 Units (has no administration in time range)  acetaminophen (TYLENOL) tablet 650 mg (has no administration in time range)    Or  acetaminophen (TYLENOL) suppository 650 mg (has no administration  in time range)  promethazine (PHENERGAN) injection 12.5 mg (has no administration in time range)  sodium chloride 0.9 % bolus 1,000 mL (1,000 mLs Intravenous New Bag/Given 05/17/20 1225)  sodium chloride 0.9 % bolus  500 mL (0 mLs Intravenous Stopped 05/17/20 1130)     MDM Rules/Calculators/A&P MDM Patient with low BP and weakness, known history of anemia and crohn's. Had C-diff done about 2 weeks ago which was neg. Will continue with IVF hydration, check labs and reassess.  ED Course  I have reviewed the triage vital signs and the nursing notes.  Pertinent labs & imaging results that were available during my care of the patient were reviewed by me and considered in my medical decision making (see chart for details).  Clinical Course as of May 17 1241  Mon May 17, 2020  1048 Patient with significant increase in WBC, has been on prednisone chronically, so unclear why this may be the case. Not febrile in the ED. Anemia is about at baseline.    [CS]  0459 CMP shows significant worsening of kidney function, likely due to decreased intake. Will continue with IVF and plan admission. Will send for CT abd/pel due to significant leukocytosis.    [CS]  9774 Patient with SIRS, but no definite infectious source to warrant initiating the sepsis protocol. Spoke with Dr. Carles Collet, hospitalist, who requests we add blood cultures. If an source becomes evident will initiate Abx as well.    [CS]    Clinical Course User Index [CS] Truddie Hidden, MD    Final Clinical Impression(s) / ED Diagnoses Final diagnoses:  AKI (acute kidney injury) ([place])  Leukocytosis, unspecified type    Rx / DC Orders ED Discharge Orders    None       Truddie Hidden, MD 05/17/20 1242

## 2020-05-17 NOTE — ED Triage Notes (Addendum)
Pt brought to ED via [facility] for generalized weakness x 2 weeks. Per EMS systolic BP 16'Z upon arrival, 500 ml NS given, came up to 100's. Pt c/o vomiting x 2 days and diarrhea x 1 week. Pt c/o lower back pain x 2 weeks. Pt states her stools have been dark for about 2 weeks but she has been taking iron supplements.

## 2020-05-18 ENCOUNTER — Encounter (HOSPITAL_COMMUNITY): Payer: Self-pay | Admitting: Urology

## 2020-05-18 DIAGNOSIS — N179 Acute kidney failure, unspecified: Secondary | ICD-10-CM

## 2020-05-18 DIAGNOSIS — K529 Noninfective gastroenteritis and colitis, unspecified: Secondary | ICD-10-CM

## 2020-05-18 DIAGNOSIS — R6520 Severe sepsis without septic shock: Secondary | ICD-10-CM | POA: Diagnosis not present

## 2020-05-18 DIAGNOSIS — A419 Sepsis, unspecified organism: Secondary | ICD-10-CM

## 2020-05-18 DIAGNOSIS — R197 Diarrhea, unspecified: Secondary | ICD-10-CM

## 2020-05-18 LAB — URINALYSIS, ROUTINE W REFLEX MICROSCOPIC
Bilirubin Urine: NEGATIVE
Glucose, UA: NEGATIVE mg/dL
Ketones, ur: 5 mg/dL — AB
Nitrite: NEGATIVE
Protein, ur: 30 mg/dL — AB
RBC / HPF: >50 RBC/hpf — ABNORMAL HIGH (ref 0–5)
Specific Gravity, Urine: 1.01 (ref 1.005–1.030)
WBC, UA: >50 WBC/hpf — ABNORMAL HIGH (ref 0–5)
pH: 5 (ref 5.0–8.0)

## 2020-05-18 LAB — BLOOD CULTURE ID PANEL (REFLEXED) - BCID2

## 2020-05-18 LAB — GASTROINTESTINAL PANEL BY PCR, STOOL (REPLACES STOOL CULTURE)

## 2020-05-18 LAB — CBC
HCT: 33.1 % — ABNORMAL LOW (ref 36.0–46.0)
Hemoglobin: 9.8 g/dL — ABNORMAL LOW (ref 12.0–15.0)
MCH: 23.1 pg — ABNORMAL LOW (ref 26.0–34.0)
MCHC: 29.6 g/dL — ABNORMAL LOW (ref 30.0–36.0)
MCV: 78.1 fL — ABNORMAL LOW (ref 80.0–100.0)
Platelets: 153 10*3/uL (ref 150–400)
RBC: 4.24 MIL/uL (ref 3.87–5.11)
RDW: 19.9 % — ABNORMAL HIGH (ref 11.5–15.5)
WBC: 20.8 10*3/uL — ABNORMAL HIGH (ref 4.0–10.5)
nRBC: 0 % (ref 0.0–0.2)

## 2020-05-18 LAB — BASIC METABOLIC PANEL
Anion gap: 12 (ref 5–15)
BUN: 42 mg/dL — ABNORMAL HIGH (ref 8–23)
CO2: 17 mmol/L — ABNORMAL LOW (ref 22–32)
Calcium: 8.2 mg/dL — ABNORMAL LOW (ref 8.9–10.3)
Chloride: 111 mmol/L (ref 98–111)
Creatinine, Ser: 2.55 mg/dL — ABNORMAL HIGH (ref 0.44–1.00)
GFR, Estimated: 18 mL/min — ABNORMAL LOW (ref >60)
Glucose, Bld: 147 mg/dL — ABNORMAL HIGH (ref 70–99)
Potassium: 4.7 mmol/L (ref 3.5–5.1)
Sodium: 140 mmol/L (ref 135–145)

## 2020-05-18 LAB — C DIFFICILE QUICK SCREEN W PCR REFLEX
C Diff antigen: NEGATIVE
C Diff interpretation: NOT DETECTED
C Diff toxin: NEGATIVE

## 2020-05-18 MED ORDER — ALPRAZOLAM 0.5 MG PO TABS
0.5000 mg | ORAL_TABLET | Freq: Every evening | ORAL | Status: DC | PRN
  Administered 2020-05-20 – 2020-05-23 (×4): 0.5 mg via ORAL
  Filled 2020-05-18 (×4): qty 1

## 2020-05-18 MED ORDER — ALUM & MAG HYDROXIDE-SIMETH 200-200-20 MG/5ML PO SUSP
30.0000 mL | ORAL | Status: DC | PRN
  Administered 2020-05-18 (×2): 30 mL via ORAL
  Filled 2020-05-18 (×2): qty 30

## 2020-05-18 MED ORDER — LIP MEDEX EX OINT
1.0000 "application " | TOPICAL_OINTMENT | CUTANEOUS | Status: DC | PRN
  Filled 2020-05-18: qty 7

## 2020-05-18 MED ORDER — AZATHIOPRINE 50 MG PO TABS
50.0000 mg | ORAL_TABLET | Freq: Every day | ORAL | Status: DC
  Administered 2020-05-18 – 2020-05-24 (×7): 50 mg via ORAL
  Filled 2020-05-18 (×7): qty 1

## 2020-05-18 NOTE — Progress Notes (Signed)
CRITICAL VALUE ALERT  Critical Value: Positive blood culture   Date & Time Notied: 05/18/20 0730  Provider Notified: Dr. Maylene Roes   Orders Received/Actions taken: None

## 2020-05-18 NOTE — Plan of Care (Signed)

## 2020-05-18 NOTE — Progress Notes (Addendum)
PROGRESS NOTE    Rhonda Bailey  ZOX:[id number] DOB: Dec 19, 1945 DOA: 05/17/2020 PCP: [name], MD     Brief Narrative:  Rhonda Bailey is a 74 y.o. female with medical history of inflammatory bowel disease initially diagnosed via colonoscopy on 12/04/2019.  The patient also has history of hypothyroidism, anxiety, and iron deficiency anemia.  The patient has been receiving Humira under the guidance of Dr. [name].  In addition, the patient has been on Lialda and azathioprine as well as a prednisone taper for her inflammatory bowel disease.  She is down to prednisone 5 mg daily with which she is due to finish in 3 days.  She presents with 1 week history of nausea, vomiting, and diarrhea.  The patient has been having 8 loose stools on a daily basis without any hematochezia.  She has having some black stools, but she is taking iron.  Because of her vomiting, the patient states that she has not been able to keep down her medications for about 48 hours prior to this admission.  She has had increasing generalized weakness and difficulty getting out of bed today.    In the emergency department, the patient was afebrile with hypotension initially 81/68.  She was started on IV fluids with some improvement of her blood pressure.  BMP showed a serum creatinine of 4.33 which is above her usual baseline.  LFTs were unremarkable.  WBC 36.3, hemoglobin 10.8, platelets 215,000.  Hospitalist was contacted for admission for further evaluation and treatment.  She has been fully vaccinated for COVID with Moderna.  Worked up revealed left ureteral stone. She was transferred to [facility] and underwent stent placement by urology, Dr. [name].  GI was also consulted.  New events last 24 hours / Subjective: She is sitting in chair, eating [place] ice.  She states that she feels much better since being in the hospital.  Assessment & Plan:   Principal Problem:   Sepsis ([place]) Active Problems:   AKI  (acute kidney injury) ([place])   Diarrhea   IBD (inflammatory bowel disease)   Immunosuppression ([place])   Severe sepsis secondary to obstructive ureteral stone and gram-negative bacteremia  -Patient presented with tachycardia, leukocytosis and hypotension which were responsive to IV fluid -Status post left ureteral stent placement 10/11 by Dr. [name]  -Blood culture positive for gram-negative rods, identification and sensitivity pending -Continue Rocephin -Improving, WBC trending downward  Inflammatory bowel disease -Has been on outpatient Humira, azathioprine, Lialda, steroid taper -CT abdomen pelvis revealed colon wall thickening as well as diverticulosis -Appreciate GI -Continue azathioprine, Lialda -GI PCR pending.  C. difficile negative  AKI -Improving, continue IV fluid  Hypothyroidism -Continue Synthroid  Anxiety -Continue trazodone, alprazolam     DVT prophylaxis:  Place and maintain sequential compression device Start: 05/17/20 1708 heparin injection 5,000 Units Start: 05/17/20 1400  Code Status: Full code Family Communication: No family at bedside Disposition Plan:  Status is: Inpatient  Remains inpatient appropriate because:Ongoing diagnostic testing needed not appropriate for outpatient work up and IV treatments appropriate due to intensity of illness or inability to take PO   Dispo: The patient is from: Home              Anticipated d/c is to: Home              Anticipated d/c date is: 2 days              Patient currently is not medically stable to d/c.  Patient's  blood cultures were positive.  She remains on IV antibiotics.   Consultants:   GI  Urology  Procedures:   Status post left ureteral stent placement 10/11 by Dr. [name]   Antimicrobials:  Anti-infectives (From admission, onward)   Start     Dose/Rate Route Frequency Ordered Stop   05/17/20 2145  cefTRIAXone (ROCEPHIN) 2 g in sodium chloride 0.9 % 100 mL IVPB  Status:  Discontinued        2  g 200 mL/hr over 30 Minutes Intravenous Every 24 hours 05/17/20 2144 05/17/20 2152   05/17/20 1730  cefTRIAXone (ROCEPHIN) 2 g in sodium chloride 0.9 % 100 mL IVPB        2 g 200 mL/hr over 30 Minutes Intravenous Every 24 hours 05/17/20 1639          Objective: Vitals:   05/17/20 2211 05/17/20 2244 05/18/20 0543 05/18/20 0918  BP:  108/60 (!) 111/57 137/71  Pulse: (!) 111 (!) 111 94 89  Resp: 18 20 (!) 22 (!) 21  Temp:  98.4 F (36.9 C) 97.9 F (36.6 C) 97.7 F (36.5 C)  TempSrc:  Oral Oral Oral  SpO2: 94% 94% 97% 96%  Weight:      Height:        Intake/Output Summary (Last 24 hours) at 05/18/2020 1218 Last data filed at 05/18/2020 1041 Gross per 24 hour  Intake 4341.34 ml  Output 1100 ml  Net 3241.34 ml   Filed Weights   05/17/20 0936  Weight: 73.9 kg    Examination:  General exam: Appears calm and comfortable  Respiratory system: Clear to auscultation. Respiratory effort normal. No respiratory distress. No conversational dyspnea.  Cardiovascular system: S1 & S2 heard, RRR. No murmurs. No pedal edema. Gastrointestinal system: Abdomen is nondistended, soft and nontender. Normal bowel sounds heard. Central nervous system: Alert and oriented. No focal neurological deficits. Speech clear.  Extremities: Symmetric in appearance  Skin: No rashes, lesions or ulcers on exposed skin  Psychiatry: Judgement and insight appear normal. Mood & affect appropriate.    Data Reviewed: I have personally reviewed following labs and imaging studies  CBC: Recent Labs  Lab 05/17/20 1015 05/18/20 0746  WBC 36.3* 20.8*  HGB 10.8* 9.8*  HCT 36.1 33.1*  MCV 75.8* 78.1*  PLT 215 433   Basic Metabolic Panel: Recent Labs  Lab 05/17/20 1015 05/18/20 0930  NA 136 140  K 3.9 4.7  CL 103 111  CO2 20* 17*  GLUCOSE 116* 147*  BUN 45* 42*  CREATININE 4.33* 2.55*  CALCIUM 7.9* 8.2*   GFR: Estimated Creatinine Clearance: 19.5 mL/min (A) (by C-G formula based on SCr of 2.55 mg/dL  (H)). Liver Function Tests: Recent Labs  Lab 05/17/20 1015  AST 32  ALT 32  ALKPHOS 130*  BILITOT 0.8  PROT 5.9*  ALBUMIN 2.8*   No results for input(s): LIPASE, AMYLASE in the last 168 hours. No results for input(s): AMMONIA in the last 168 hours. Coagulation Profile: Recent Labs  Lab 05/17/20 1448  INR 1.4*   Cardiac Enzymes: No results for input(s): CKTOTAL, CKMB, CKMBINDEX, TROPONINI in the last 168 hours. BNP (last 3 results) No results for input(s): PROBNP in the last 8760 hours. HbA1C: No results for input(s): HGBA1C in the last 72 hours. CBG: No results for input(s): GLUCAP in the last 168 hours. Lipid Profile: No results for input(s): CHOL, HDL, LDLCALC, TRIG, CHOLHDL, LDLDIRECT in the last 72 hours. Thyroid Function Tests: No results for input(s): TSH, T4TOTAL, FREET4,  T3FREE, THYROIDAB in the last 72 hours. Anemia Panel: No results for input(s): VITAMINB12, FOLATE, FERRITIN, TIBC, IRON, RETICCTPCT in the last 72 hours. Sepsis Labs: Recent Labs  Lab 05/17/20 1338 05/17/20 1449  PROCALCITON >150.00  --   LATICACIDVEN 1.9 1.5    Recent Results (from the past 240 hour(s))  Respiratory Panel by RT PCR (Flu A&B, Covid) - Nasopharyngeal Swab     Status: None   Collection Time: 05/17/20 11:21 AM   Specimen: Nasopharyngeal Swab  Result Value Ref Range Status   SARS Coronavirus 2 by RT PCR NEGATIVE NEGATIVE Final    Comment: (NOTE) SARS-CoV-2 target nucleic acids are NOT DETECTED.  The SARS-CoV-2 RNA is generally detectable in upper respiratoy specimens during the acute phase of infection. The lowest concentration of SARS-CoV-2 viral copies this assay can detect is 131 copies/mL. A negative result does not preclude SARS-Cov-2 infection and should not be used as the sole basis for treatment or other patient management decisions. A negative result may occur with  improper specimen collection/handling, submission of specimen other than nasopharyngeal swab,  presence of viral mutation(s) within the areas targeted by this assay, and inadequate number of viral copies (<131 copies/mL). A negative result must be combined with clinical observations, patient history, and epidemiological information. The expected result is Negative.  Fact Sheet for Patients:  PinkCheek.be  Fact Sheet for Healthcare Providers:  GravelBags.it  This test is no t yet approved or cleared by the [place] FDA and  has been authorized for detection and/or diagnosis of SARS-CoV-2 by FDA under an Emergency Use Authorization (EUA). This EUA will remain  in effect (meaning this test can be used) for the duration of the COVID-19 declaration under Section 564(b)(1) of the Act, 21 U.S.C. section 360bbb-3(b)(1), unless the authorization is terminated or revoked sooner.     Influenza A by PCR NEGATIVE NEGATIVE Final   Influenza B by PCR NEGATIVE NEGATIVE Final    Comment: (NOTE) The Xpert Xpress SARS-CoV-2/FLU/RSV assay is intended as an aid in  the diagnosis of influenza from Nasopharyngeal swab specimens and  should not be used as a sole basis for treatment. Nasal washings and  aspirates are unacceptable for Xpert Xpress SARS-CoV-2/FLU/RSV  testing.  Fact Sheet for Patients: PinkCheek.be  Fact Sheet for Healthcare Providers: GravelBags.it  This test is not yet approved or cleared by the [place] FDA and  has been authorized for detection and/or diagnosis of SARS-CoV-2 by  FDA under an Emergency Use Authorization (EUA). This EUA will remain  in effect (meaning this test can be used) for the duration of the  Covid-19 declaration under Section 564(b)(1) of the Act, 21  U.S.C. section 360bbb-3(b)(1), unless the authorization is  terminated or revoked. Performed at [facility], [address], [place], [place] [address]   Culture, blood (Routine X  2) w Reflex to ID Panel     Status: None (Preliminary result)   Collection Time: 05/17/20 12:48 PM   Specimen: BLOOD  Result Value Ref Range Status   Specimen Description   Final    BLOOD RIGHT ANTECUBITAL Performed at [facility], [address] [place], [place] [address]    Special Requests   Final    BOTTLES DRAWN AEROBIC AND ANAEROBIC Blood Culture results may not be optimal due to an inadequate volume of blood received in culture bottles Performed at [facility], [address] [place], [place] [address]    Culture  Setup Time   Final    ANAEROBIC  BOTTLE ONLY GRAM NEGATIVE RODS PATIENT DISCHARGED OR EXPIRED Gram Stain Report Called to,Read Back By and Verified With: ONAEGBUAM,I RN @ [facility] @0730  05/18/2020 BY MATTHEWS, B  Organism ID to follow Performed at [facility], [address] [place], [place] [address]    Culture GRAM NEGATIVE RODS  Final   Report Status PENDING  Incomplete  Blood Culture ID Panel (Reflexed)     Status: Abnormal   Collection Time: 05/17/20 12:48 PM  Result Value Ref Range Status   Enterococcus faecalis NOT DETECTED NOT DETECTED Final   Enterococcus Faecium NOT DETECTED NOT DETECTED Final   Listeria monocytogenes NOT DETECTED NOT DETECTED Final   Staphylococcus species NOT DETECTED NOT DETECTED Final   Staphylococcus aureus (BCID) NOT DETECTED NOT DETECTED Final   Staphylococcus epidermidis NOT DETECTED NOT DETECTED Final   Staphylococcus lugdunensis NOT DETECTED NOT DETECTED Final   Streptococcus species NOT DETECTED NOT DETECTED Final   Streptococcus agalactiae NOT DETECTED NOT DETECTED Final   Streptococcus pneumoniae NOT DETECTED NOT DETECTED Final   Streptococcus pyogenes NOT DETECTED NOT DETECTED Final   A.calcoaceticus-baumannii NOT DETECTED NOT DETECTED Final   Bacteroides fragilis NOT DETECTED NOT DETECTED Final   Enterobacterales DETECTED (A) NOT DETECTED Final    Comment: Enterobacterales represent a large order of gram negative  bacteria, not a single organism. CRITICAL RESULT CALLED TO, READ BACK BY AND VERIFIED WITH: Guad[place]pe Spanish PharmD 10:30 05/18/20 ([email or web address])    Enterobacter cloacae complex NOT DETECTED NOT DETECTED Final   Escherichia coli NOT DETECTED NOT DETECTED Final   Klebsiella aerogenes NOT DETECTED NOT DETECTED Final   Klebsiella oxytoca NOT DETECTED NOT DETECTED Final   Klebsiella pneumoniae NOT DETECTED NOT DETECTED Final   Proteus species DETECTED (A) NOT DETECTED Final    Comment: CRITICAL RESULT CALLED TO, READ BACK BY AND VERIFIED WITH: Guadlupe Spanish PharmD 10:30 05/18/20 ([email or web address])    Salmonella species NOT DETECTED NOT DETECTED Final   Serratia marcescens NOT DETECTED NOT DETECTED Final   Haemophilus influenzae NOT DETECTED NOT DETECTED Final   Neisseria meningitidis NOT DETECTED NOT DETECTED Final   Pseudomonas aeruginosa NOT DETECTED NOT DETECTED Final   Stenotrophomonas maltophilia NOT DETECTED NOT DETECTED Final   Candida albicans NOT DETECTED NOT DETECTED Final   Candida auris NOT DETECTED NOT DETECTED Final   Candida glabrata NOT DETECTED NOT DETECTED Final   Candida krusei NOT DETECTED NOT DETECTED Final   Candida parapsilosis NOT DETECTED NOT DETECTED Final   Candida tropicalis NOT DETECTED NOT DETECTED Final   Cryptococcus neoformans/gattii NOT DETECTED NOT DETECTED Final   CTX-M ESBL NOT DETECTED NOT DETECTED Final   Carbapenem resistance IMP NOT DETECTED NOT DETECTED Final   Carbapenem resistance KPC NOT DETECTED NOT DETECTED Final   Carbapenem resistance NDM NOT DETECTED NOT DETECTED Final   Carbapenem resist OXA 48 LIKE NOT DETECTED NOT DETECTED Final   Carbapenem resistance VIM NOT DETECTED NOT DETECTED Final    Comment: Performed at [facility], [address], [place], [place] [address]  Culture, blood (Routine X 2) w Reflex to ID Panel     Status: None (Preliminary result)   Collection Time: 05/17/20  1:46 PM   Specimen: BLOOD  Result Value Ref Range Status    Specimen Description   Final    BLOOD Performed at [facility], [address], [place], [place] [address]    Special Requests   Final    NONE Performed at [facility], [address] [place], [place]  [address]    Culture  Setup Time   Final    AEROBIC BOTTLE ONLY GRAM NEGATIVE RODS PATIENT DISCHARGED OR EXPIRED ANAEROBIC BOTTLE GRAM NEGATIVE RODS [facility] RN AT [facility]@0730  BY [facility] 10.12.2021   Performed at [facility], [address], [place], [place]    Culture GRAM NEGATIVE RODS  Final   Report Status PENDING  Incomplete  C Difficile Quick Screen w PCR reflex     Status: None   Collection Time: 05/18/20  4:18 AM  Result Value Ref Range Status   C Diff antigen NEGATIVE NEGATIVE Final   C Diff toxin NEGATIVE NEGATIVE Final   C Diff interpretation No C. difficile detected.  Final    Comment: Performed at [facility], [place] [address] [place], [place]      Radiology Studies: CT Abdomen Pelvis Wo Contrast  Result Date: 05/17/2020 CLINICAL DATA:  Weakness, vomiting, diarrhea EXAM: CT ABDOMEN AND PELVIS WITHOUT CONTRAST TECHNIQUE: Multidetector CT imaging of the abdomen and pelvis was performed following the standard protocol without IV contrast. COMPARISON:  10/13/2019 FINDINGS: Lower chest: Bibasilar atelectasis/scarring. Hepatobiliary: Few scattered calcified granulomas liver. Cholecystectomy. No unexpected biliary dilatation. Pancreas: Unremarkable apart from fatty replacement. Spleen: Unremarkable. Adrenals/Urinary Tract: Stable 2 cm lesion of the left adrenal with stability since 2020 suggesting an adenoma. Right adrenal is unremarkable. Right kidney is unremarkable. There is mild left hydronephrosis and proximal hydroureter. Obstructing calculus or calculi present within the mid left ureter measuring 2 mm axially and 2.5 cm craniocaudally. Bladder is unremarkable. Stomach/Bowel: Stomach is within normal limits. Bowel is normal in  caliber. There is distal colonic diverticulosis. Wall thickening involving much of the colon. Vascular/Lymphatic: Mild aortic atherosclerosis. No enlarged lymph nodes identified. Reproductive: Uterus and bilateral adnexa are unremarkable. Other: No ascites.  Abdominal wall is unremarkable. Musculoskeletal: New but chronic appearing compression deformity of L1 and L2. Degenerative changes of the included spine and hips. IMPRESSION: Obstructing calculus or adjacent calculi within the mid left ureter with mild proximal hydroureteronephrosis. Wall thickening involving much of the colon, which could be on the basis of underdistention or reflect infectious/inflammatory colitis given provided history. Distal colonic diverticulosis. Electronically Signed   By: 2021 M.D.   On: 05/17/2020 14:40   DG CHEST PORT 1 VIEW  Result Date: 05/17/2020 CLINICAL DATA:  Weakness. EXAM: PORTABLE CHEST 1 VIEW COMPARISON:  October 14, 2019. FINDINGS: The heart size and mediastinal contours are within normal limits. Both lungs are clear. The visualized skeletal structures are unremarkable. IMPRESSION: No active disease. Electronically Signed   By: October 27, 2019 M.D.   On: 05/17/2020 13:00   DG C-Arm 1-60 Min-No Report  Result Date: 05/17/2020 Fluoroscopy was utilized by the requesting physician.  No radiographic interpretation.      Scheduled Meds: . Chlorhexidine Gluconate Cloth  6 each Topical Daily  . heparin  5,000 Units Subcutaneous Q8H  . levothyroxine  37.5 mcg Oral QAC breakfast  . mesalamine  2.4 g Oral Q breakfast  . ondansetron (ZOFRAN) IV  4 mg Intravenous Q6H  . pantoprazole (PROTONIX) IV  40 mg Intravenous Q12H   Continuous Infusions: . sodium chloride 1,000 mL (05/18/20 0911)  . cefTRIAXone (ROCEPHIN)  IV Stopped (05/17/20 1943)     LOS: 1 day      Time spent: 40 minutes   23/11/21, DO [facility] 05/18/2020, 12:18 PM   Available via Epic secure chat 7am-7pm After  these hours, please refer to coverage provider listed  on [email or web address]

## 2020-05-18 NOTE — Consult Note (Signed)
Referring Provider:  [facility]         Primary Care Physician:  [name], MD Primary Gastroenterologist:    Justice Britain, MD          We were asked to see this patient for:  diarrhea                ASSESSMENT / PLAN:   # IBD ( Crohn's colitis vrs pan ulcerative colitis). Severely active chronic colitis on biopsies in April 2021. Maintained on Humira, Azathioprine, lialda.  Recently completed long course of steroids.  --She has been having diarrhea since mid September. Stools studies were negative but CPR elevated at 50.   --Humira antibody / drug monitoring labs were ordered by our office but apparently not yet collected.  --Repeat stool studies this admission also negative. Non-contrast CT scan shows diffuse colon wall thickening but could be underdistention.  --She reports compliance with IBD medications.  --Will check fecal calprotectin.  --Continue Azathioprine and lialda.  --Will need to check for humira levels / antibodies but not sure that can be done inpatient.       HPI:                                                                                                                             Chief Complaint: diarrhea  GENOLA YUILLE is a 74 y.o. female with a pmh significant for, not necessarily limited to:  HTN, hypothyroidism, sleep apnea, iron deficiency anemia, anxiety, nephrolithiasis, hemorrhoids, GERD, IBD (Crohn's colitis versus pan ulcerative colitis), COVID infection, cholecystectomy  Patient had a colonoscopy by Dr. Gala Romney in January 2020 remarkable for marked inflammatory changes in the left colon.  Biopsy showed severely active colitis without chronic features.  Biopsies of the rectum showed granulation tissue consistent with ulcer.  At some point in the year 2020 she was treated with a short course of Entocort.  She continued to have diarrhea and rectal bleeding.  She was seen by surgery February 2021 for consideration of a hemorrhoidectomy.   Operative note mentions that the anal canal was narrowed and there was a large ulcerated posterior lesion.  Biopsy showed granulation tissue, no malignancy.  We saw patient in the hospital March 2021 for diarrhea and an abnormal CT scan..  CT scan showed diffuse colitis and a perianal sinus tract.  Findings were suspicious for Crohn's disease.  She was started on started and subsequently discharged home on prednisone.  Patient was subsequently seen in the clinic and scheduled for colonoscopy which was done by Dr. [name].  There was pancolitis.  Diverticulosis of the entire colon.  She had severely active chronic colitis in the left colon/rectum, mild to moderately active chronic colitis of the right colon.  Terminal ileal biopsies were negative.  Patient is now being followed by Dr. [name]. She is maintained on Lialda, Azathioprine 50 mg daily, and Humira  40 mg Q 2 weeks.  Patient has been concerned  about the high dose of lialda so at her last visit 9/17 we reduced dose to 2.4 grams daily. At that visit the plan was to finally get her off steroids and she was given a tapering schedule.  A few days after her 9/17 appt, patient called the office with diarrhea and cramping.  Her CRP was elevated at 5.,  Stool studies were negative.  Humira antibody/drug monitoring labs were requested but I don't see results.   Patient presented to [name] ED yesterday with loose stool, vomiting, weakness.  Her white count was 36,000.  She had AKI.  Due to vomiting patient says she had not been able to keep down her medication for couple of days.  When EMS arrived her systolic blood pressure was in the 70s.  She was admitted with SIRS. Non-contrast CT scan of the abdomen and pelvis showed an obstructing renal stone with mild proximal hydroureteronephrosis.  Additionally there was wall thickening involving much of the colon.  Patient was seen by GI at [facility], stool studies were ordered.  Patient was transferred to  [facility] for treatment of nephrolithiasis.  Stool for C. difficile negative.  GI pathogen panel negative.  Yesterday patient underwent cystoscopy with left ureteral stent placement..   Patient says that she has continued to have diarrhea since calling our office late September.  She does mention taking antibiotics for possible UTI and thinks the diarrhea may have started the antibiotic.  However.  Patient says she discontinue the antibiotics but the diarrhea persisted.  She has not had any blood in her stool.  She denies abdominal pain.  She was having right flank pain but that has now resolved.  No nausea or vomiting today.  So far today she has had only 2 bowel movements but says they were smaller volume compared to the last several days.  Next Humira shot is due on Saturday    PREVIOUS ENDOSCOPIC EVALUATIONS / PERTINENT STUDIES   12/04/19 colonoscopy -adequate prep - Rectal tenderness, hemorrhoids and rectal stricture found on digital rectal exam. - The examined portion of the ileum was normal. Biopsied. - 8 mm polyp was found in the proximal ascending colon. The polyp was sessile. The polyp was removed with a cold snare. Resection and retrieval were complete. - Colitis. Inflammation was found in the transverse colon, in the hepatic flexure, in the ascending colon and at the cecum. This was graded as Mayo Score 1 (mild disease) and graded as Mayo Score 2 (moderate disease). Biopsied. - Colitis. Inflammation was found from the rectum to the splenic flexure. This was graded as Mayo Score 3 (severe disease). Biopsied. single (solitary) ulcer in the distal rectum. Biopsied. - Diverticulosis in the entire examined colon. - Non-bleeding non-thrombosed external and internal hemorrhoids.  Surgical [P], small bowel, terminal ileum - ILEAL MUCOSA WITH NO SIGNIFICANT PATHOLOGIC FINDINGS. - NEGATIVE FOR ACTIVE INFLAMMATION, GRANULOMATA AND DYSPLASIA. 2. Surgical [P], right colon -  MILD-MODERATELY ACTIVE CHRONIC COLITIS. - NEGATIVE FOR GRANULOMATA AND DYSPLASIA. 3. Surgical [P], colon, ascending, polyp - POLYPOID COLONIC MUCOSA WITH MILD HYPERPLASTIC CHANGES. - NEGATIVE FOR DYSPLASIA. 4. Surgical [P], left colon - SEVERELY ACTIVE CHRONIC COLITIS WITH ULCERATION. - NEGATIVE FOR GRANULOMATA AND DYSPLASIA. 5. Surgical [P], colon, rectum - SEVERELY ACTIVE CHRONIC COLITIS WITH ULCERATION. - NEGATIVE FOR GRANULOMATA AND DYSPLASIA. 6. Surgical [P], colon, distal rectal ulcer - SEVERELY ACTIVE CHRONIC COLITIS WITH ULCERATION. - NEGATIVE FOR GRANULOMATA AND DYSPLASIA.    Past Medical History:  Diagnosis Date  . Anal  fissure   . Anxiety   . Anxiety disorder, unspecified   . Essential (primary) hypertension   . Gastro-esophageal reflux disease with esophagitis   . GERD (gastroesophageal reflux disease)   . Hypokalemia   . Hypothyroidism   . Noninfective gastroenteritis and colitis, unspecified   . Sleep apnea     Past Surgical History:  Procedure Laterality Date  . BIOPSY  09/02/2018   Procedure: BIOPSY;  Surgeon: Daneil Dolin, MD;  Location: [facility];  Service: Endoscopy;;  colon   . CHOLECYSTECTOMY  05/19/2011   Procedure: LAPAROSCOPIC CHOLECYSTECTOMY;  Surgeon: Jamesetta So;  Location: [facility];  Service: General;  Laterality: N/A;  . COLONOSCOPY  11/2019   Dr. [name]:  rectal ulceration, TI and IC valve appeared normal, 8 mm polyp in proximal ascending colon that was sessile. Inflammation in transverse colon to cecum, inflammation with altered vascularity, edema, erosions, erythema, friability, shallow ulcerations in a continuous and circumferential pattern from rectum to splenix flexure, single twenty mm ulcer in distal rectum. Small and large-  . COLONOSCOPY WITH PROPOFOL N/A 09/02/2018   Dr. Gala Romney: Proctocolitis limited to the left side with some exudate versus partial pseudomembrane formation, erosions and ulceration somewhat superficial.   Terminal ileum normal.  Diverticulosis.  Descending/sigmoid colon biopsy showing severe active colitis, diagnostic features of IBD not identified.  Differential includes infection, ischemia.  Rectal biopsies with granulation tissue c/w ulcer  . CYSTOSCOPY W/ URETERAL STENT PLACEMENT Right 05/08/2019   Procedure: CYSTOSCOPY WITH RETROGRADE PYELOGRAM/URETERAL STENT PLACEMENT;  Surgeon: Festus Aloe, MD;  Location: [facility];  Service: Urology;  Laterality: Right;  . CYSTOSCOPY W/ URETERAL STENT PLACEMENT Left 05/17/2020   Procedure: CYSTOSCOPY WITH RETROGRADE PYELOGRAM/URETERAL STENT PLACEMENT;  Surgeon: Robley Fries, MD;  Location: [facility];  Service: Urology;  Laterality: Left;  . CYSTOSCOPY WITH URETEROSCOPY Right 06/11/2019   Procedure: CYSTOSCOPY WITH DIAGNOSTIC URETEROSCOPY; URETERAL STENT REMOVAL;  Surgeon: Cleon Gustin, MD;  Location: [facility];  Service: Urology;  Laterality: Right;  . HEMORRHOID SURGERY N/A 09/16/2019   Procedure: EXTENSIVE HEMORRHOIDECTOMY;  Surgeon: Aviva Signs, MD;  Location: [facility];  Service: General;  Laterality: N/A;  . RECTAL BIOPSY N/A 09/16/2019   Procedure: BIOPSY RECTAL;  Surgeon: Aviva Signs, MD;  Location: [facility];  Service: General;  Laterality: N/A;  . TUBAL LIGATION      Prior to Admission medications   Medication Sig Start Date End Date Taking? Authorizing Provider  acidophilus (RISAQUAD) CAPS capsule Take 1 capsule by mouth daily.    [provider]  Adalimumab (HUMIRA PEN) 40 MG/0.4ML PNKT Inject 40 mg into the skin every 14 (fourteen) days. 12/11/19   Mansouraty, Telford Nab., MD  ALPRAZolam Duanne Moron) 0.5 MG tablet Take 0.5 mg by mouth at bedtime as needed for sleep.    [provider]  amitriptyline (ELAVIL) 10 MG tablet Take 10 mg by mouth daily.  11/04/19   [provider]  ANECREAM5 5 % CREA APPLY TO ANUS EVERY 8 HOURS AS NEEDED FOR PAIN Patient taking differently: Place 1 application rectally every 8 (eight) hours as  needed (pain).  11/05/19   Aviva Signs, MD  azaTHIOprine (IMURAN) 50 MG tablet Take 1 tablet (50 mg total) by mouth daily. 12/11/19   Mansouraty, Telford Nab., MD  dicyclomine (BENTYL) 10 MG capsule Take 1 capsule (10 mg total) by mouth 4 (four) times daily -  before meals and at bedtime. 04/30/20   Mansouraty, Telford Nab., MD  ferrous gluconate (FERGON) 324 MG tablet  TAKE 1 TABLET BY MOUTH DAILY WITH BREAKFAST 04/09/20   Mansouraty, Telford Nab., MD  HYDROCORTISONE ACE, RECTAL, 30 MG SUPP Place 1 suppository (30 mg total) rectally in the morning and at bedtime. 10/29/19   Esterwood, Amy S, PA-C  levothyroxine (SYNTHROID, LEVOTHROID) 25 MCG tablet Take 25 mcg by mouth daily before breakfast.     [provider]  mesalamine (LIALDA) 1.2 g EC tablet TAKE 4 TABLETS BY MOUTH DAILY WITH BREAKFAST. 03/30/20   Mansouraty, Telford Nab., MD  ondansetron (ZOFRAN ODT) 8 MG disintegrating tablet Take 1 tablet (8 mg total) by mouth every 8 (eight) hours as needed for nausea or vomiting. 10/16/19   Barb Merino, MD  pantoprazole (PROTONIX) 40 MG tablet Take 40 mg by mouth at bedtime.  08/17/18   [provider]  predniSONE (DELTASONE) 5 MG tablet Start Taper - 03/27/20- 71m daily thru- 04/20/20-10 mg daily, thur- 04/24/20- 53mdaily, thru- 05/08/20-0 03/12/20   Mansouraty, GaTelford Nab MD  traZODone (DESYREL) 50 MG tablet Take 50 mg by mouth at bedtime as needed for sleep.  08/25/18   [provider]    Current Facility-Administered Medications  Medication Dose Route Frequency Provider Last Rate Last Admin  . 0.9 %  sodium chloride infusion  1,000 mL Intravenous Continuous ChDessa PhiDO 125 mL/hr at 05/18/20 0911 1,000 mL at 05/18/20 0911  . acetaminophen (TYLENOL) tablet 650 mg  650 mg Oral Q6H PRN Tat, DaShanon BrowMD   650 mg at 05/18/20 0544   Or  . acetaminophen (TYLENOL) suppository 650 mg  650 mg Rectal Q6H PRN Tat, DaShanon BrowMD      . ALPRAZolam (XDuanne Morontablet 0.5 mg  0.5 mg Oral QHS PRN Tat,  DaShanon BrowMD      . cefTRIAXone (ROCEPHIN) 2 g in sodium chloride 0.9 % 100 mL IVPB  2 g Intravenous Q24H TaOrson EvaMD   Stopped at 05/17/20 1943  . Chlorhexidine Gluconate Cloth 2 % PADS 6 each  6 each Topical Daily McKenzie, PaCandee FurbishMD   6 each at 05/18/20 0907  . heparin injection 5,000 Units  5,000 Units Subcutaneous Q8Franco ColletMD   5,000 Units at 05/18/20 0532  . levothyroxine (SYNTHROID) tablet 37.5 mcg  37.5 mcg Oral QAC breakfast Tat, DaShanon BrowMD   37.5 mcg at 05/18/20 0539  . mesalamine (LIALDA) EC tablet 2.4 g  2.4 g Oral Q breakfast Tat, David, MD   2.4 g at 05/18/20 0900  . ondansetron (ZOFRAN) injection 4 mg  4 mg Intravenous Q6H TaOrson EvaMD   4 mg at 05/18/20 0533  . pantoprazole (PROTONIX) injection 40 mg  40 mg Intravenous Q1Therisa DoyneMD   40 mg at 05/18/20 0903  . promethazine (PHENERGAN) injection 12.5 mg  12.5 mg Intravenous Q6H PRN TaOrson EvaMD   12.5 mg at 05/17/20 1933  . traZODone (DESYREL) tablet 50 mg  50 mg Oral QHS PRN TaOrson EvaMD        Allergies as of 05/17/2020 - Review Complete 05/17/2020  Allergen Reaction Noted  . Tramadol  05/08/2019    Family History  Problem Relation Age of Onset  . Cancer Father        mouth  . Heart disease Sister   . Heart disease Brother   . Cancer Brother        head  . Cancer Daughter        ? type  . Anesthesia problems Neg Hx   . Hypotension Neg  Hx   . Malignant hyperthermia Neg Hx   . Pseudochol deficiency Neg Hx   . Colon cancer Neg Hx   . Esophageal cancer Neg Hx   . Inflammatory bowel disease Neg Hx   . Liver disease Neg Hx   . Pancreatic cancer Neg Hx   . Rectal cancer Neg Hx   . Stomach cancer Neg Hx     Social History   Socioeconomic History  . Marital status: Divorced    Spouse name: Not on file  . Number of children: 2  . Years of education: Not on file  . Highest education level: Not on file  Occupational History  . Occupation: retired  Tobacco Use  . Smoking status: Never  Smoker  . Smokeless tobacco: Never Used  Vaping Use  . Vaping Use: Never used  Substance and Sexual Activity  . Alcohol use: Not Currently    Comment: once a year  . Drug use: No  . Sexual activity: Not Currently  Other Topics Concern  . Not on file  Social History Narrative  . Not on file   Social Determinants of Health   Financial Resource Strain:   . Difficulty of Paying Living Expenses: Not on file  Food Insecurity:   . Worried About Charity fundraiser in the Last Year: Not on file  . Ran Out of Food in the Last Year: Not on file  Transportation Needs:   . Lack of Transportation (Medical): Not on file  . Lack of Transportation (Non-Medical): Not on file  Physical Activity:   . Days of Exercise per Week: Not on file  . Minutes of Exercise per Session: Not on file  Stress:   . Feeling of Stress : Not on file  Social Connections:   . Frequency of Communication with Friends and Family: Not on file  . Frequency of Social Gatherings with Friends and Family: Not on file  . Attends Religious Services: Not on file  . Active Member of Clubs or Organizations: Not on file  . Attends Archivist Meetings: Not on file  . Marital Status: Not on file  Intimate Partner Violence:   . Fear of Current or Ex-Partner: Not on file  . Emotionally Abused: Not on file  . Physically Abused: Not on file  . Sexually Abused: Not on file    Review of Systems: All systems reviewed and negative except where noted in HPI.    OBJECTIVE:    Physical Exam: Vital signs in last 24 hours: Temp:  [97.7 F (36.5 C)-99 F (37.2 C)] 97.7 F (36.5 C) (10/12 0918) Pulse Rate:  [89-113] 89 (10/12 0918) Resp:  [18-30] 21 (10/12 0918) BP: (91-141)/(41-75) 137/71 (10/12 0918) SpO2:  [92 %-100 %] 96 % (10/12 0918) Last BM Date: 05/17/20 General:   Alert, well-developed, female in NAD Psych:  Pleasant, cooperative. Normal mood and affect. Eyes:  Pupils equal, sclera clear, no icterus.    Conjunctiva pink. Ears:  Normal auditory acuity. Nose:  No deformity, discharge,  or lesions. Neck:  Supple; no masses Lungs:  Clear throughout to auscultation.   No wheezes, crackles, or rhonchi.  Heart:  Regular rate and rhythm; no murmurs, no lower extremity edema Abdomen:  Soft, non-distended, nontender, BS active, no palp mass   Rectal:  Deferred  Msk:  Symmetrical without gross deformities. . Neurologic:  Alert and  oriented x4;  grossly normal neurologically. Skin:  Intact without significant lesions or rashes.  Filed Weights   05/17/20  4496  Weight: 73.9 kg     Scheduled inpatient medications . Chlorhexidine Gluconate Cloth  6 each Topical Daily  . heparin  5,000 Units Subcutaneous Q8H  . levothyroxine  37.5 mcg Oral QAC breakfast  . mesalamine  2.4 g Oral Q breakfast  . ondansetron (ZOFRAN) IV  4 mg Intravenous Q6H  . pantoprazole (PROTONIX) IV  40 mg Intravenous Q12H      Intake/Output from previous day: 10/11 0701 - 10/12 0700 In: 3712.5 [I.V.:1714.2; IV Piggyback:1998.4] Out: 550 [Urine:550] Intake/Output this shift: Total I/O In: 398.8 [I.V.:398.8] Out: 550 [Urine:550]   Lab Results: Recent Labs    05/17/20 1015 05/18/20 0746  WBC 36.3* 20.8*  HGB 10.8* 9.8*  HCT 36.1 33.1*  PLT 215 153   BMET Recent Labs    05/17/20 1015 05/18/20 0930  NA 136 140  K 3.9 4.7  CL 103 111  CO2 20* 17*  GLUCOSE 116* 147*  BUN 45* 42*  CREATININE 4.33* 2.55*  CALCIUM 7.9* 8.2*   LFT Recent Labs    05/17/20 1015  PROT 5.9*  ALBUMIN 2.8*  AST 32  ALT 32  ALKPHOS 130*  BILITOT 0.8   PT/INR Recent Labs    05/17/20 1448  LABPROT 16.6*  INR 1.4*   Hepatitis Panel No results for input(s): HEPBSAG, HCVAB, HEPAIGM, HEPBIGM in the last 72 hours.   . CBC Latest Ref Rng & Units 05/18/2020 05/17/2020 04/30/2020  WBC 4.0 - 10.5 K/uL 20.8(H) 36.3(H) 11.6(H)  Hemoglobin 12.0 - 15.0 g/dL 9.8(L) 10.8(L) 11.3(L)  Hematocrit 36 - 46 % 33.1(L) 36.1 36.8   Platelets 150 - 400 K/uL 153 215 440.0(H)    . CMP Latest Ref Rng & Units 05/18/2020 05/17/2020 04/30/2020  Glucose 70 - 99 mg/dL 147(H) 116(H) 148(H)  BUN 8 - 23 mg/dL 42(H) 45(H) 18  Creatinine 0.44 - 1.00 mg/dL 2.55(H) 4.33(H) 0.99  Sodium 135 - 145 mmol/L 140 136 141  Potassium 3.5 - 5.1 mmol/L 4.7 3.9 4.0  Chloride 98 - 111 mmol/L 111 103 104  CO2 22 - 32 mmol/L 17(L) 20(L) 26  Calcium 8.9 - 10.3 mg/dL 8.2(L) 7.9(L) 9.6  Total Protein 6.5 - 8.1 g/dL - 5.9(L) 7.6  Total Bilirubin 0.3 - 1.2 mg/dL - 0.8 0.3  Alkaline Phos 38 - 126 U/L - 130(H) 109  AST 15 - 41 U/L - 32 11  ALT 0 - 44 U/L - 32 9   Studies/Results: CT Abdomen Pelvis Wo Contrast  Result Date: 05/17/2020 CLINICAL DATA:  Weakness, vomiting, diarrhea EXAM: CT ABDOMEN AND PELVIS WITHOUT CONTRAST TECHNIQUE: Multidetector CT imaging of the abdomen and pelvis was performed following the standard protocol without IV contrast. COMPARISON:  10/13/2019 FINDINGS: Lower chest: Bibasilar atelectasis/scarring. Hepatobiliary: Few scattered calcified granulomas liver. Cholecystectomy. No unexpected biliary dilatation. Pancreas: Unremarkable apart from fatty replacement. Spleen: Unremarkable. Adrenals/Urinary Tract: Stable 2 cm lesion of the left adrenal with stability since 2020 suggesting an adenoma. Right adrenal is unremarkable. Right kidney is unremarkable. There is mild left hydronephrosis and proximal hydroureter. Obstructing calculus or calculi present within the mid left ureter measuring 2 mm axially and 2.5 cm craniocaudally. Bladder is unremarkable. Stomach/Bowel: Stomach is within normal limits. Bowel is normal in caliber. There is distal colonic diverticulosis. Wall thickening involving much of the colon. Vascular/Lymphatic: Mild aortic atherosclerosis. No enlarged lymph nodes identified. Reproductive: Uterus and bilateral adnexa are unremarkable. Other: No ascites.  Abdominal wall is unremarkable. Musculoskeletal: New but chronic  appearing compression deformity of L1 and L2. Degenerative  changes of the included spine and hips. IMPRESSION: Obstructing calculus or adjacent calculi within the mid left ureter with mild proximal hydroureteronephrosis. Wall thickening involving much of the colon, which could be on the basis of underdistention or reflect infectious/inflammatory colitis given provided history. Distal colonic diverticulosis. Electronically Signed   By: Macy Mis M.D.   On: 05/17/2020 14:40   DG CHEST PORT 1 VIEW  Result Date: 05/17/2020 CLINICAL DATA:  Weakness. EXAM: PORTABLE CHEST 1 VIEW COMPARISON:  October 14, 2019. FINDINGS: The heart size and mediastinal contours are within normal limits. Both lungs are clear. The visualized skeletal structures are unremarkable. IMPRESSION: No active disease. Electronically Signed   By: Marijo Conception M.D.   On: 05/17/2020 13:00   DG C-Arm 1-60 Min-No Report  Result Date: 05/17/2020 Fluoroscopy was utilized by the requesting physician.  No radiographic interpretation.    Active Problems:   AKI (acute kidney injury) ([place])   Diarrhea   IBD (inflammatory bowel disease)   Immunosuppression ([place])   SIRS (systemic inflammatory response syndrome) ([facility])    Tye Savoy, NP-C @  05/18/2020, 9:55 AM

## 2020-05-18 NOTE — Anesthesia Postprocedure Evaluation (Signed)
Anesthesia Post Note  Patient: Rhonda Bailey  Procedure(s) Performed: CYSTOSCOPY WITH RETROGRADE PYELOGRAM/URETERAL STENT PLACEMENT (Left )     Patient location during evaluation: PACU Anesthesia Type: General Level of consciousness: awake and alert Pain management: pain level controlled Vital Signs Assessment: post-procedure vital signs reviewed and stable Respiratory status: spontaneous breathing, nonlabored ventilation, respiratory function stable and patient connected to nasal cannula oxygen Cardiovascular status: blood pressure returned to baseline and stable Postop Assessment: no apparent nausea or vomiting Anesthetic complications: no   No complications documented.  Last Vitals:  Vitals:   05/18/20 0918 05/18/20 1220  BP: 137/71 117/61  Pulse: 89 89  Resp: (!) 21 17  Temp: 36.5 C 37.2 C  SpO2: 96% 97%    Last Pain:  Vitals:   05/18/20 1220  TempSrc: Oral  PainSc:                  Gyneth Hubka DAVID

## 2020-05-18 NOTE — Progress Notes (Signed)
PHARMACY - PHYSICIAN COMMUNICATION CRITICAL VALUE ALERT - BLOOD CULTURE IDENTIFICATION (BCID)  Rhonda Bailey is an 74 y.o. female who presented to [facility] on 05/17/2020 with a chief complaint of n/v/d; found to have ureteral stone, AKI, and urosepsis  Assessment: 2/2 BCx (2/4 bottles) with Proteus (suspect urinary source)  Name of physician (or Provider) ContactedMaylene Roes  Current antibiotics: Rocephin 2g IV daily  Changes to prescribed antibiotics recommended:  Patient is on recommended antibiotics - No changes needed  Results for orders placed or performed during the hospital encounter of 05/17/20  Blood Culture ID Panel (Reflexed) (Collected: 05/17/2020 12:48 PM)  Result Value Ref Range   Enterococcus faecalis NOT DETECTED NOT DETECTED   Enterococcus Faecium NOT DETECTED NOT DETECTED   Listeria monocytogenes NOT DETECTED NOT DETECTED   Staphylococcus species NOT DETECTED NOT DETECTED   Staphylococcus aureus (BCID) NOT DETECTED NOT DETECTED   Staphylococcus epidermidis NOT DETECTED NOT DETECTED   Staphylococcus lugdunensis NOT DETECTED NOT DETECTED   Streptococcus species NOT DETECTED NOT DETECTED   Streptococcus agalactiae NOT DETECTED NOT DETECTED   Streptococcus pneumoniae NOT DETECTED NOT DETECTED   Streptococcus pyogenes NOT DETECTED NOT DETECTED   A.calcoaceticus-baumannii NOT DETECTED NOT DETECTED   Bacteroides fragilis NOT DETECTED NOT DETECTED   Enterobacterales DETECTED (A) NOT DETECTED   Enterobacter cloacae complex NOT DETECTED NOT DETECTED   Escherichia coli NOT DETECTED NOT DETECTED   Klebsiella aerogenes NOT DETECTED NOT DETECTED   Klebsiella oxytoca NOT DETECTED NOT DETECTED   Klebsiella pneumoniae NOT DETECTED NOT DETECTED   Proteus species DETECTED (A) NOT DETECTED   Salmonella species NOT DETECTED NOT DETECTED   Serratia marcescens NOT DETECTED NOT DETECTED   Haemophilus influenzae NOT DETECTED NOT DETECTED   Neisseria meningitidis NOT DETECTED NOT  DETECTED   Pseudomonas aeruginosa NOT DETECTED NOT DETECTED   Stenotrophomonas maltophilia NOT DETECTED NOT DETECTED   Candida albicans NOT DETECTED NOT DETECTED   Candida auris NOT DETECTED NOT DETECTED   Candida glabrata NOT DETECTED NOT DETECTED   Candida krusei NOT DETECTED NOT DETECTED   Candida parapsilosis NOT DETECTED NOT DETECTED   Candida tropicalis NOT DETECTED NOT DETECTED   Cryptococcus neoformans/gattii NOT DETECTED NOT DETECTED   CTX-M ESBL NOT DETECTED NOT DETECTED   Carbapenem resistance IMP NOT DETECTED NOT DETECTED   Carbapenem resistance KPC NOT DETECTED NOT DETECTED   Carbapenem resistance NDM NOT DETECTED NOT DETECTED   Carbapenem resist OXA 48 LIKE NOT DETECTED NOT DETECTED   Carbapenem resistance VIM NOT DETECTED NOT DETECTED    Dayan Desa A 05/18/2020  1:10 PM

## 2020-05-18 NOTE — Progress Notes (Signed)
1 Day Post-Op Subjective: Patient transferred from [facility] last night for urgent ureteral stent placement.  No overnight events when transferred to the floor after stent placement.  Labs pending this morning.  Objective: Vital signs in last 24 hours: Temp:  [97.9 F (36.6 C)-99 F (37.2 C)] 97.9 F (36.6 C) (10/12 0543) Pulse Rate:  [94-113] 94 (10/12 0543) Resp:  [18-30] 22 (10/12 0543) BP: (81-141)/(41-75) 111/57 (10/12 0543) SpO2:  [92 %-100 %] 97 % (10/12 0543) Weight:  [73.9 kg] 73.9 kg (10/11 0936)  Intake/Output from previous day: 10/11 0701 - 10/12 0700 In: 3712.5 [I.V.:1714.2; IV Piggyback:1998.4] Out: 550 [Urine:550] Intake/Output this shift: Total I/O In: 1463.4 [I.V.:1363.4; IV Piggyback:100] Out: 550 [Urine:550]  Physical Exam:  General: Alert and oriented CV: RRR Lungs: Clear Abdomen: Soft, ND, nontender Ext: NT, No erythema  Lab Results: Recent Labs    05/17/20 1015  HGB 10.8*  HCT 36.1   BMET Recent Labs    05/17/20 1015  NA 136  K 3.9  CL 103  CO2 20*  GLUCOSE 116*  BUN 45*  CREATININE 4.33*  CALCIUM 7.9*     Studies/Results: CT Abdomen Pelvis Wo Contrast  Result Date: 05/17/2020 CLINICAL DATA:  Weakness, vomiting, diarrhea EXAM: CT ABDOMEN AND PELVIS WITHOUT CONTRAST TECHNIQUE: Multidetector CT imaging of the abdomen and pelvis was performed following the standard protocol without IV contrast. COMPARISON:  10/13/2019 FINDINGS: Lower chest: Bibasilar atelectasis/scarring. Hepatobiliary: Few scattered calcified granulomas liver. Cholecystectomy. No unexpected biliary dilatation. Pancreas: Unremarkable apart from fatty replacement. Spleen: Unremarkable. Adrenals/Urinary Tract: Stable 2 cm lesion of the left adrenal with stability since 2020 suggesting an adenoma. Right adrenal is unremarkable. Right kidney is unremarkable. There is mild left hydronephrosis and proximal hydroureter. Obstructing calculus or calculi present within the mid left  ureter measuring 2 mm axially and 2.5 cm craniocaudally. Bladder is unremarkable. Stomach/Bowel: Stomach is within normal limits. Bowel is normal in caliber. There is distal colonic diverticulosis. Wall thickening involving much of the colon. Vascular/Lymphatic: Mild aortic atherosclerosis. No enlarged lymph nodes identified. Reproductive: Uterus and bilateral adnexa are unremarkable. Other: No ascites.  Abdominal wall is unremarkable. Musculoskeletal: New but chronic appearing compression deformity of L1 and L2. Degenerative changes of the included spine and hips. IMPRESSION: Obstructing calculus or adjacent calculi within the mid left ureter with mild proximal hydroureteronephrosis. Wall thickening involving much of the colon, which could be on the basis of underdistention or reflect infectious/inflammatory colitis given provided history. Distal colonic diverticulosis. Electronically Signed   By: Macy Mis M.D.   On: 05/17/2020 14:40   DG CHEST PORT 1 VIEW  Result Date: 05/17/2020 CLINICAL DATA:  Weakness. EXAM: PORTABLE CHEST 1 VIEW COMPARISON:  October 14, 2019. FINDINGS: The heart size and mediastinal contours are within normal limits. Both lungs are clear. The visualized skeletal structures are unremarkable. IMPRESSION: No active disease. Electronically Signed   By: Marijo Conception M.D.   On: 05/17/2020 13:00   DG C-Arm 1-60 Min-No Report  Result Date: 05/17/2020 Fluoroscopy was utilized by the requesting physician.  No radiographic interpretation.    Assessment/Plan: 74 year old woman who presented to [facility], ER with abdominal pain found to have a 7 mm left proximal ureteral calculus with hydronephrosis and clinical signs of sepsis now postop day 1 status post left ureteral stent placement.  -Continue Foley catheter for 24 hours from stent placement for maximal decompression -Repeat CBC and BMP -Patient will continue stent and have definitive stone removal surgery scheduled in  approximately 2  to 3 weeks -Continue antibiotics and follow-up on cultures   LOS: 1 day   Karolynn Infantino D Yassen Kinnett 05/18/2020, 6:58 AM

## 2020-05-18 NOTE — Progress Notes (Signed)
GI panel and Cdiff results negative. MD notified. Enteric precautions discontinued per MD orders

## 2020-05-18 NOTE — Progress Notes (Signed)
Assessment unchanged, resting without distress.Pt requested med for indigestion. Reviewed standing orders and med ordered. Plan of care continues. Will continue to monitor. SRP, RN

## 2020-05-19 ENCOUNTER — Inpatient Hospital Stay (HOSPITAL_COMMUNITY): Payer: Medicare HMO

## 2020-05-19 DIAGNOSIS — R197 Diarrhea, unspecified: Secondary | ICD-10-CM | POA: Diagnosis not present

## 2020-05-19 DIAGNOSIS — D649 Anemia, unspecified: Secondary | ICD-10-CM

## 2020-05-19 DIAGNOSIS — F419 Anxiety disorder, unspecified: Secondary | ICD-10-CM

## 2020-05-19 DIAGNOSIS — R933 Abnormal findings on diagnostic imaging of other parts of digestive tract: Secondary | ICD-10-CM

## 2020-05-19 DIAGNOSIS — A419 Sepsis, unspecified organism: Secondary | ICD-10-CM | POA: Diagnosis not present

## 2020-05-19 DIAGNOSIS — K529 Noninfective gastroenteritis and colitis, unspecified: Secondary | ICD-10-CM | POA: Diagnosis not present

## 2020-05-19 DIAGNOSIS — E039 Hypothyroidism, unspecified: Secondary | ICD-10-CM

## 2020-05-19 DIAGNOSIS — N179 Acute kidney failure, unspecified: Secondary | ICD-10-CM

## 2020-05-19 DIAGNOSIS — R7881 Bacteremia: Secondary | ICD-10-CM | POA: Diagnosis not present

## 2020-05-19 LAB — BLOOD GAS, ARTERIAL
Acid-Base Excess: 3.9 mmol/L — ABNORMAL HIGH (ref 0.0–2.0)
Bicarbonate: 26.3 mmol/L (ref 20.0–28.0)
O2 Saturation: 94.8 %
Patient temperature: 98.6
pCO2 arterial: 32.2 mmHg (ref 32.0–48.0)
pH, Arterial: 7.523 — ABNORMAL HIGH (ref 7.350–7.450)
pO2, Arterial: 72.2 mmHg — ABNORMAL LOW (ref 83.0–108.0)

## 2020-05-19 LAB — CBC
HCT: 33 % — ABNORMAL LOW (ref 36.0–46.0)
Hemoglobin: 9.9 g/dL — ABNORMAL LOW (ref 12.0–15.0)
MCH: 22.8 pg — ABNORMAL LOW (ref 26.0–34.0)
MCHC: 30 g/dL (ref 30.0–36.0)
MCV: 76 fL — ABNORMAL LOW (ref 80.0–100.0)
Platelets: 132 10*3/uL — ABNORMAL LOW (ref 150–400)
RBC: 4.34 MIL/uL (ref 3.87–5.11)
RDW: 20.2 % — ABNORMAL HIGH (ref 11.5–15.5)
WBC: 21.1 10*3/uL — ABNORMAL HIGH (ref 4.0–10.5)
nRBC: 0 % (ref 0.0–0.2)

## 2020-05-19 LAB — BASIC METABOLIC PANEL
Anion gap: 12 (ref 5–15)
BUN: 32 mg/dL — ABNORMAL HIGH (ref 8–23)
CO2: 16 mmol/L — ABNORMAL LOW (ref 22–32)
Calcium: 8.7 mg/dL — ABNORMAL LOW (ref 8.9–10.3)
Chloride: 115 mmol/L — ABNORMAL HIGH (ref 98–111)
Creatinine, Ser: 1.61 mg/dL — ABNORMAL HIGH (ref 0.44–1.00)
GFR, Estimated: 31 mL/min — ABNORMAL LOW (ref >60)
Glucose, Bld: 143 mg/dL — ABNORMAL HIGH (ref 70–99)
Potassium: 4.2 mmol/L (ref 3.5–5.1)
Sodium: 143 mmol/L (ref 135–145)

## 2020-05-19 LAB — URINE CULTURE: Culture: NO GROWTH

## 2020-05-19 LAB — CALPROTECTIN, FECAL: Calprotectin, Fecal: 725 ug/g — ABNORMAL HIGH (ref 0–120)

## 2020-05-19 MED ORDER — IPRATROPIUM-ALBUTEROL 0.5-2.5 (3) MG/3ML IN SOLN: 3.0000 mL | RESPIRATORY_TRACT | Status: DC | PRN

## 2020-05-19 MED ORDER — OXYCODONE-ACETAMINOPHEN 5-325 MG PO TABS
1.0000 | ORAL_TABLET | ORAL | Status: DC | PRN
  Administered 2020-05-19 – 2020-05-20 (×3): 1 via ORAL
  Filled 2020-05-19 (×3): qty 1

## 2020-05-19 MED ORDER — STERILE WATER FOR INJECTION IV SOLN
INTRAVENOUS | Status: DC
  Filled 2020-05-19 (×2): qty 150
  Filled 2020-05-19: qty 850

## 2020-05-19 MED ORDER — GUAIFENESIN 100 MG/5ML PO SOLN: 5.0000 mL | ORAL | Status: DC | PRN

## 2020-05-19 MED ORDER — MORPHINE SULFATE (PF) 2 MG/ML IV SOLN: 1.0000 mg | INTRAVENOUS | Status: DC | PRN

## 2020-05-19 NOTE — Progress Notes (Addendum)
Rapid Response Event Note   Reason for Call :  Decreased SpO2 Lethargy    Initial Focused Assessment:  Patient complaint of right side back pain, patient at present time is alert and oriented x 4.      Interventions:  O2 @ 2 liters Primary nurse already notified MD ordered ABG, CXR  I.S.    Plan of Care:  Continue with with intervention's    Event Summary:  Patient with right sided back pain, ABG with pO2 of 72.2 continue with O2 @ 2 liters  CXR with Vascular congestion   MD Notified:  [facility] Provider   [place] Sicilia Killough, RN

## 2020-05-19 NOTE — Progress Notes (Signed)
Assumed care of patient at 1530 from Gershon Cull, RN. Agree with previously charted assessment. Will continue plan of care.

## 2020-05-19 NOTE — Progress Notes (Signed)
PROGRESS NOTE    Rhonda Bailey  YBW:[id number] DOB: 1946-04-19 DOA: 05/17/2020 PCP: [name], MD   Chief Complaint  Patient presents with  . Weakness    Brief Narrative:  Rhonda Bailey a 74 y.o.femalewith medical history ofinflammatory bowel disease initially diagnosed via colonoscopy on 12/04/2019. The patient also has history of hypothyroidism, anxiety, and iron deficiency anemia. The patient has been receiving Humira under the guidance of Dr. [name].In addition, the patient has been on Lialda and azathioprine as well as a prednisone taper for her inflammatory bowel disease. She is down to prednisone 5 mg daily with which she is due to finish in 3 days. She presents with 1 week history of nausea, vomiting, and diarrhea. The patient has been having 8 loose stools on a daily basis without any hematochezia. She has having some black stools, but she is taking iron. Because of her vomiting, the patient states that she has not been able to keep down her medications for about 48 hours prior to this admission. She has had increasing generalized weakness and difficulty getting out of bed today.   In the emergency department, the patient was afebrile with hypotension initially 81/68. She was started on IV fluids with some improvement of her blood pressure. BMP showed a serum creatinine of 4.33 which is above her usual baseline. LFTs were unremarkable. WBC 36.3, hemoglobin 10.8, platelets 215,000. Hospitalist was contacted for admission for further evaluation and treatment. She has been fully vaccinated for COVID with Moderna.  Worked up revealed left ureteral stone. She was transferred to [facility] and underwent stent placement by urology, Dr. [name].  GI was also consulted.    Assessment & Plan:   Principal Problem:   Sepsis ([place]) Active Problems:   AKI (acute kidney injury) ([place])   Diarrhea   IBD (inflammatory bowel disease)   Immunosuppression  (HCC)   Bacteremia   Abnormal CT scan, gastrointestinal tract   Acute renal failure (HCC)   Anxiety  1 severe sepsis secondary to obstructive urethral stone and Proteus bacteremia, POA Patient on admission met criteria for sepsis with tachycardia, significant leukocytosis, hypotension which was responsive to IV fluids and IV antibiotics, acute renal failure.  CT abdomen and pelvis done concerning for obstructive left ureteral stone with hydroureteronephrosis and wall thickening involving much of the colon concerning for infectious versus inflammatory colitis.  Patient was seen in consultation by urology and patient status post left ureteral stent placement 05/17/2020 per Dr. [name].  Blood cultures positive for Proteus with sensitivities pending.  Continue IV Rocephin.  Supportive care.  Follow.  2.  Inflammatory bowel disease CT abdomen and pelvis revealed colonic wall thickening as well as diverticulosis.  Patient presented with nausea vomiting and diarrhea.  Nausea vomiting improved however still with ongoing diarrhea.  Patient noted to be on Humira, azathioprine, Lialda, steroid taper in the outpatient setting.  C. difficile PCR which was done was negative.  Fecal calprotectin elevated at 725, all negative.  Consulted and due to elevated calprotectin concern for inflammatory component to her diarrhea in the setting of IBD.  GI recommending increasing mesalamine to 4.8 g daily, continuation of present therapy.  Per GI.  3.  Acute renal failure/metabolic acidosis Secondary to problem #1/post renal azotemia secondary to obstructing ureteral stone.  Status post urethral stent placement.  Urine output of 2.250 L over the past 24 hours.  Renal function trending down.  Creatinine currently at 1.61 from 4.33 on admission.  Change IV fluids to  bicarb drip.  Continue hydration.  Supportive care.  4.  Hypothyroidism Synthroid.  5.  Anxiety Trazodone, alprazolam.  6.  Anemia Likely dilutional in nature.   Patient with no overt bleeding.  Check an anemia panel.  Follow H&H.  Transfusion threshold hemoglobin <7.    DVT prophylaxis: Heparin Code Status: Full Family Communication: Updated patient.  No family at bedside. Disposition:   Status is: Inpatient    Dispo: The patient is from: Home              Anticipated d/c is to: Home              Anticipated d/c date is: 4-5 days.              Patient currently patient with bacteremia likely seeded from the urine, status post stent placement for urethral stone, on IV antibiotics, significant leukocytosis, uncontrolled CVA pain, diarrhea.  Not stable for discharge.       Consultants:   Gastroenterology: Dr. Hilarie Fredrickson 05/18/2020  Urology: Dr. [name] 05/17/2020    Procedures:   CT abdomen and pelvis 05/17/2020  Chest x-ray 05/17/2020  Cystoscopy/left urethral stent placement/left retrograde pyelography with interpretation per Dr. [name], urology 05/17/2020  Antimicrobials:  IV Rocephin 05/17/2020>>>     Subjective: Patient states she feels terrible.  Complaining of bilateral flank pain and lower back pain radiating down her legs.  Denies any chest pain or shortness of breath.  No nausea or vomiting.  States had about 3 watery stools this morning.  States no significant change with flank pain.  States Tylenol is not touching her pain.  Objective: Vitals:   05/18/20 2054 05/19/20 0505 05/19/20 0938 05/19/20 2041  BP: 132/75 138/78 (!) 141/75 (!) 156/72  Pulse: 89 91 97 100  Resp: _0 Temp: 97.9 F (36.6 C) (!) 97.3 F (36.3 C) 98.2 F (36.8 C) (!) 100.8 F (38.2 C)  TempSrc: Oral Oral Oral Oral  SpO2: 94% 94% 97% (!) 85%  Weight:      Height:        Intake/Output Summary (Last 24 hours) at 05/19/2020 2121 Last data filed at 05/19/2020 1800 Gross per 24 hour  Intake 1864.74 ml  Output 2375 ml  Net -510.26 ml   Filed Weights   05/17/20 0936  Weight: 73.9 kg    Examination:  General exam:  NAD Respiratory system: Clear to auscultation. Respiratory effort normal. Cardiovascular system: S1 & S2 heard, RRR. No JVD, murmurs, rubs, gallops or clicks. No pedal edema. Gastrointestinal system: Bilateral CVA tenderness.  Abdomen is nondistended, soft, positive bowel sounds.  No rebound.  No guarding.  Central nervous system: Alert and oriented. No focal neurological deficits. Extremities: Symmetric 5 x 5 power. Skin: No rashes, lesions or ulcers Psychiatry: Judgement and insight appear normal. Mood & affect appropriate.     Data Reviewed: I have personally reviewed following labs and imaging studies  CBC: Recent Labs  Lab 05/17/20 1015 05/18/20 0746 05/19/20 0455  WBC 36.3* 20.8* 21.1*  HGB 10.8* 9.8* 9.9*  HCT 36.1 33.1* 33.0*  MCV 75.8* 78.1* 76.0*  PLT 215 153 132*    Basic Metabolic Panel: Recent Labs  Lab 05/17/20 1015 05/18/20 0930 05/19/20 0455  NA 136 140 143  K 3.9 4.7 4.2  CL 103 111 115*  CO2 20* 17* 16*  GLUCOSE 116* 147* 143*  BUN 45* 42* 32*  CREATININE 4.33* 2.55* 1.61*  CALCIUM 7.9* 8.2* 8.7*    GFR: Estimated Creatinine  Clearance: 30.9 mL/min (A) (by C-G formula based on SCr of 1.61 mg/dL (H)).  Liver Function Tests: Recent Labs  Lab 05/17/20 1015  AST 32  ALT 32  ALKPHOS 130*  BILITOT 0.8  PROT 5.9*  ALBUMIN 2.8*    CBG: No results for input(s): GLUCAP in the last 168 hours.   Recent Results (from the past 240 hour(s))  Respiratory Panel by RT PCR (Flu A&B, Covid) - Nasopharyngeal Swab     Status: None   Collection Time: 05/17/20 11:21 AM   Specimen: Nasopharyngeal Swab  Result Value Ref Range Status   SARS Coronavirus 2 by RT PCR NEGATIVE NEGATIVE Final    Comment: (NOTE) SARS-CoV-2 target nucleic acids are NOT DETECTED.  The SARS-CoV-2 RNA is generally detectable in upper respiratoy specimens during the acute phase of infection. The lowest concentration of SARS-CoV-2 viral copies this assay can detect is 131 copies/mL.  A negative result does not preclude SARS-Cov-2 infection and should not be used as the sole basis for treatment or other patient management decisions. A negative result may occur with  improper specimen collection/handling, submission of specimen other than nasopharyngeal swab, presence of viral mutation(s) within the areas targeted by this assay, and inadequate number of viral copies (<131 copies/mL). A negative result must be combined with clinical observations, patient history, and epidemiological information. The expected result is Negative.  Fact Sheet for Patients:  https://www.fda.gov/media/142436/download  Fact Sheet for Healthcare Providers:  https://www.fda.gov/media/142435/download  This test is no t yet approved or cleared by the [place] FDA and  has been authorized for detection and/or diagnosis of SARS-CoV-2 by FDA under an Emergency Use Authorization (EUA). This EUA will remain  in effect (meaning this test can be used) for the duration of the COVID-19 declaration under Section 564(b)(1) of the Act, 21 U.S.C. section 360bbb-3(b)(1), unless the authorization is terminated or revoked sooner.     Influenza A by PCR NEGATIVE NEGATIVE Final   Influenza B by PCR NEGATIVE NEGATIVE Final    Comment: (NOTE) The Xpert Xpress SARS-CoV-2/FLU/RSV assay is intended as an aid in  the diagnosis of influenza from Nasopharyngeal swab specimens and  should not be used as a sole basis for treatment. Nasal washings and  aspirates are unacceptable for Xpert Xpress SARS-CoV-2/FLU/RSV  testing.  Fact Sheet for Patients: https://www.fda.gov/media/142436/download  Fact Sheet for Healthcare Providers: https://www.fda.gov/media/142435/download  This test is not yet approved or cleared by the [place] FDA and  has been authorized for detection and/or diagnosis of SARS-CoV-2 by  FDA under an Emergency Use Authorization (EUA). This EUA will remain  in effect (meaning this test  can be used) for the duration of the  Covid-19 declaration under Section 564(b)(1) of the Act, 21  U.S.C. section 360bbb-3(b)(1), unless the authorization is  terminated or revoked. Performed at [facility], [address] [place], [place] [address]   Culture, blood (Routine X 2) w Reflex to ID Panel     Status: Abnormal (Preliminary result)   Collection Time: 05/17/20 12:48 PM   Specimen: BLOOD  Result Value Ref Range Status   Specimen Description   Final    BLOOD RIGHT ANTECUBITAL Performed at [facility], [address] [place], [place] [address]    Special Requests   Final    BOTTLES DRAWN AEROBIC AND ANAEROBIC Blood Culture results may not be optimal due to an inadequate volume of blood received in culture bottles Performed at [facility], [address] [place], [place] [address]      Culture  Setup Time   Final    ANAEROBIC BOTTLE ONLY GRAM NEGATIVE RODS PATIENT DISCHARGED OR EXPIRED Gram Stain Report Called to,Read Back By and Verified With: ONAEGBUAM,I RN @ [facility] _0  05/18/2020 BY MATTHEWS, B     Culture (A)  Final    PROTEUS MIRABILIS SUSCEPTIBILITIES TO FOLLOW Performed at [facility], [place] [address] [place], [place] [address]    Report Status PENDING  Incomplete  Blood Culture ID Panel (Reflexed)     Status: Abnormal   Collection Time: 05/17/20 12:48 PM  Result Value Ref Range Status   Enterococcus faecalis NOT DETECTED NOT DETECTED Final   Enterococcus Faecium NOT DETECTED NOT DETECTED Final   Listeria monocytogenes NOT DETECTED NOT DETECTED Final   Staphylococcus species NOT DETECTED NOT DETECTED Final   Staphylococcus aureus (BCID) NOT DETECTED NOT DETECTED Final   Staphylococcus epidermidis NOT DETECTED NOT DETECTED Final   Staphylococcus lugdunensis NOT DETECTED NOT DETECTED Final   Streptococcus species NOT DETECTED NOT DETECTED Final   Streptococcus agalactiae NOT DETECTED NOT DETECTED Final   Streptococcus pneumoniae NOT DETECTED NOT DETECTED  Final   Streptococcus pyogenes NOT DETECTED NOT DETECTED Final   A.calcoaceticus-baumannii NOT DETECTED NOT DETECTED Final   Bacteroides fragilis NOT DETECTED NOT DETECTED Final   Enterobacterales DETECTED (A) NOT DETECTED Final    Comment: Enterobacterales represent a large order of gram negative bacteria, not a single organism. CRITICAL RESULT CALLED TO, READ BACK BY AND VERIFIED WITH: [place] Spanish PharmD 10:30 05/18/20 ([email or web address])    Enterobacter cloacae complex NOT DETECTED NOT DETECTED Final   Escherichia coli NOT DETECTED NOT DETECTED Final   Klebsiella aerogenes NOT DETECTED NOT DETECTED Final   Klebsiella oxytoca NOT DETECTED NOT DETECTED Final   Klebsiella pneumoniae NOT DETECTED NOT DETECTED Final   Proteus species DETECTED (A) NOT DETECTED Final    Comment: CRITICAL RESULT CALLED TO, READ BACK BY AND VERIFIED WITH: Guad[place]pe Spanish PharmD 10:30 05/18/20 ([email or web address])    Salmonella species NOT DETECTED NOT DETECTED Final   Serratia marcescens NOT DETECTED NOT DETECTED Final   Haemophilus influenzae NOT DETECTED NOT DETECTED Final   Neisseria meningitidis NOT DETECTED NOT DETECTED Final   Pseudomonas aeruginosa NOT DETECTED NOT DETECTED Final   Stenotrophomonas maltophilia NOT DETECTED NOT DETECTED Final   Candida albicans NOT DETECTED NOT DETECTED Final   Candida auris NOT DETECTED NOT DETECTED Final   Candida glabrata NOT DETECTED NOT DETECTED Final   Candida krusei NOT DETECTED NOT DETECTED Final   Candida parapsilosis NOT DETECTED NOT DETECTED Final   Candida tropicalis NOT DETECTED NOT DETECTED Final   Cryptococcus neoformans/gattii NOT DETECTED NOT DETECTED Final   CTX-M ESBL NOT DETECTED NOT DETECTED Final   Carbapenem resistance IMP NOT DETECTED NOT DETECTED Final   Carbapenem resistance KPC NOT DETECTED NOT DETECTED Final   Carbapenem resistance NDM NOT DETECTED NOT DETECTED Final   Carbapenem resist OXA 48 LIKE NOT DETECTED NOT DETECTED Final   Carbapenem resistance VIM NOT  DETECTED NOT DETECTED Final    Comment: Performed at [facility], [address], [place], [place] [address]  Culture, blood (Routine X 2) w Reflex to ID Panel     Status: Abnormal (Preliminary result)   Collection Time: 05/17/20  1:46 PM   Specimen: BLOOD  Result Value Ref Range Status   Specimen Description   Final    BLOOD Performed at [facility], [address], [place], [place] [address]    Special Requests   Final  NONE Performed at [facility], [address] [place], [place] [address]    Culture  Setup Time   Final    AEROBIC BOTTLE ONLY GRAM NEGATIVE RODS PATIENT DISCHARGED OR EXPIRED ANAEROBIC BOTTLE GRAM NEGATIVE RODS ONAEGBUAM,I RN AT [facility]@0730 BY MATTHEWS, B 10.12.2021   Performed at [facility], [address] [place], [place] [address]    Culture PROTEUS MIRABILIS (A)  Final   Report Status PENDING  Incomplete  C Difficile Quick Screen w PCR reflex     Status: None   Collection Time: 05/18/20  4:18 AM  Result Value Ref Range Status   C Diff antigen NEGATIVE NEGATIVE Final   C Diff toxin NEGATIVE NEGATIVE Final   C Diff interpretation No C. difficile detected.  Final    Comment: Performed at [facility], [address] [place], [place] [address]  Gastrointestinal Panel by PCR , Stool     Status: None   Collection Time: 05/18/20  4:18 AM  Result Value Ref Range Status   Campylobacter species NOT DETECTED NOT DETECTED Final   Plesimonas shigelloides NOT DETECTED NOT DETECTED Final   Salmonella species NOT DETECTED NOT DETECTED Final   Yersinia enterocolitica NOT DETECTED NOT DETECTED Final   Vibrio species NOT DETECTED NOT DETECTED Final   Vibrio cholerae NOT DETECTED NOT DETECTED Final   Enteroaggregative E coli (EAEC) NOT DETECTED NOT DETECTED Final   Enteropathogenic E coli (EPEC) NOT DETECTED NOT DETECTED Final   Enterotoxigenic E coli (ETEC) NOT DETECTED NOT DETECTED Final   Shiga like toxin producing E coli (STEC) NOT  DETECTED NOT DETECTED Final   Shigella/Enteroinvasive E coli (EIEC) NOT DETECTED NOT DETECTED Final   Cryptosporidium NOT DETECTED NOT DETECTED Final   Cyclospora cayetanensis NOT DETECTED NOT DETECTED Final   Entamoeba histolytica NOT DETECTED NOT DETECTED Final   Giardia lamblia NOT DETECTED NOT DETECTED Final   Adenovirus F40/41 NOT DETECTED NOT DETECTED Final   Astrovirus NOT DETECTED NOT DETECTED Final   Norovirus GI/GII NOT DETECTED NOT DETECTED Final   Rotavirus A NOT DETECTED NOT DETECTED Final   Sapovirus (I, II, IV, and V) NOT DETECTED NOT DETECTED Final    Comment: Performed at [facility], [address] [place], [place] [address]  Urine Culture     Status: None   Collection Time: 05/18/20  4:18 AM   Specimen: Urine, Clean Catch  Result Value Ref Range Status   Specimen Description   Final    URINE, CLEAN CATCH Performed at [facility], [address] [place], [place] [address]    Special Requests   Final    NONE Performed at [facility], [address] [place], [place] [address]    Culture   Final    NO GROWTH Performed at [facility], [address] [place], [place] [address]    Report Status 05/19/2020 FINAL  Final  Calprotectin, Fecal     Status: Abnormal   Collection Time: 05/18/20  1:06 PM   Specimen: Stool  Result Value Ref Range Status   Calprotectin, Fecal 725 (H) 0 - 120 ug/g Final    Comment: (NOTE) Concentration     Interpretation   Follow-Up <16 - 50 ug/g     Normal           None >50 -120 ug/g     Borderline       Re-evaluate in 4-6 weeks    >120 ug/g       Abnormal         Repeat as clinically                                   indicated Performed At: [facility] [address] [place], [place] [id number] Nagendra Sanjai MD Ph:[phone]          Radiology Studies: DG C-Arm 1-60 Min-No Report  Result Date: 05/17/2020 Fluoroscopy was utilized by the requesting physician.   No radiographic interpretation.        Scheduled Meds: . azaTHIOprine  50 mg Oral Daily  . Chlorhexidine Gluconate Cloth  6 each Topical Daily  . heparin  5,000 Units Subcutaneous Q8H  . levothyroxine  37.5 mcg Oral QAC breakfast  . mesalamine  2.4 g Oral Q breakfast  . ondansetron (ZOFRAN) IV  4 mg Intravenous Q6H  . pantoprazole (PROTONIX) IV  40 mg Intravenous Q12H   Continuous Infusions: . cefTRIAXone (ROCEPHIN)  IV 2 g (05/19/20 1714)  .  sodium bicarbonate (isotonic) infusion in sterile water 125 mL/hr at 05/19/20 1207     LOS: 2 days    Time spent: 40 minutes     , MD [facility]   To contact the attending provider between 7A-7P or the covering provider during after hours 7P-7A, please log into the web site [email or web address] and access using universal [place] password for that web site. If you do not have the password, please call the hospital operator.  05/19/2020, 9:21 PM   

## 2020-05-19 NOTE — Progress Notes (Signed)
Patient ID: COLLEEN KOTLARZ, female   DOB: [birth year], 74 y.o.   MRN: [id number]    Progress Note   Subjective   day # 2  CC; AKI left ureteral stone sepsis /  IBD  Fecal calprotectin - pend Adalimumab Ab/ level - pend WBC 21.1, hgb 9.9/ MCV 76  BUN 32/ creat 1.61  GI path panel/ cdiff -neg   Humira Azathioprine Lialda Prednisone stopped Rocephin   BC + Gram Neg rods    S/P left ureteral stent placement 10/11  Pt says she doesn't feel any better - C/O left back pain which has persisted  Also continues with diarrhea -3 x last night     Objective   Vital signs in last 24 hours: Temp:  [97.3 F (36.3 C)-99 F (37.2 C)] 97.3 F (36.3 C) (10/13 0505) Pulse Rate:  [89-91] 91 (10/13 0505) Resp:  [17-21] 18 (10/13 0505) BP: (117-138)/(61-78) 138/78 (10/13 0505) SpO2:  [94 %-97 %] 94 % (10/13 0505) Last BM Date: 05/18/20 General:  older  white female in NAD Heart:  Regular rate and rhythm; no murmurs Lungs: Respirations even and unlabored, lungs CTA bilaterally Abdomen:  Soft,nondistended, some tenderness left abd, Normal bowel sounds. Extremities:  1+ edema Neurologic:  Alert and oriented,  grossly normal neurologically. Psych:  Cooperative. Normal mood and affect.  Intake/Output from previous day: 10/12 0701 - 10/13 0700 In: 3067.2 [P.O.:580; I.V.:2387.2; IV Piggyback:100] Out: 2250 [Urine:2250] Intake/Output this shift: No intake/output data recorded.  Lab Results: Recent Labs    05/17/20 1015 05/18/20 0746 05/19/20 0455  WBC 36.3* 20.8* 21.1*  HGB 10.8* 9.8* 9.9*  HCT 36.1 33.1* 33.0*  PLT 215 153 132*   BMET Recent Labs    05/17/20 1015 05/18/20 0930 05/19/20 0455  NA 136 140 143  K 3.9 4.7 4.2  CL 103 111 115*  CO2 20* 17* 16*  GLUCOSE 116* 147* 143*  BUN 45* 42* 32*  CREATININE 4.33* 2.55* 1.61*  CALCIUM 7.9* 8.2* 8.7*   LFT Recent Labs    05/17/20 1015  PROT 5.9*  ALBUMIN 2.8*  AST 32  ALT 32  ALKPHOS 130*  BILITOT 0.8    PT/INR Recent Labs    05/17/20 1448  LABPROT 16.6*  INR 1.4*    Studies/Results: CT Abdomen Pelvis Wo Contrast  Result Date: 05/17/2020 CLINICAL DATA:  Weakness, vomiting, diarrhea EXAM: CT ABDOMEN AND PELVIS WITHOUT CONTRAST TECHNIQUE: Multidetector CT imaging of the abdomen and pelvis was performed following the standard protocol without IV contrast. COMPARISON:  10/13/2019 FINDINGS: Lower chest: Bibasilar atelectasis/scarring. Hepatobiliary: Few scattered calcified granulomas liver. Cholecystectomy. No unexpected biliary dilatation. Pancreas: Unremarkable apart from fatty replacement. Spleen: Unremarkable. Adrenals/Urinary Tract: Stable 2 cm lesion of the left adrenal with stability since 2020 suggesting an adenoma. Right adrenal is unremarkable. Right kidney is unremarkable. There is mild left hydronephrosis and proximal hydroureter. Obstructing calculus or calculi present within the mid left ureter measuring 2 mm axially and 2.5 cm craniocaudally. Bladder is unremarkable. Stomach/Bowel: Stomach is within normal limits. Bowel is normal in caliber. There is distal colonic diverticulosis. Wall thickening involving much of the colon. Vascular/Lymphatic: Mild aortic atherosclerosis. No enlarged lymph nodes identified. Reproductive: Uterus and bilateral adnexa are unremarkable. Other: No ascites.  Abdominal wall is unremarkable. Musculoskeletal: New but chronic appearing compression deformity of L1 and L2. Degenerative changes of the included spine and hips. IMPRESSION: Obstructing calculus or adjacent calculi within the mid left ureter with mild proximal hydroureteronephrosis. Wall thickening involving much of the colon,  which could be on the basis of underdistention or reflect infectious/inflammatory colitis given provided history. Distal colonic diverticulosis. Electronically Signed   By: Macy Mis M.D.   On: 05/17/2020 14:40   DG CHEST PORT 1 VIEW  Result Date: 05/17/2020 CLINICAL DATA:   Weakness. EXAM: PORTABLE CHEST 1 VIEW COMPARISON:  October 14, 2019. FINDINGS: The heart size and mediastinal contours are within normal limits. Both lungs are clear. The visualized skeletal structures are unremarkable. IMPRESSION: No active disease. Electronically Signed   By: Marijo Conception M.D.   On: 05/17/2020 13:00   DG C-Arm 1-60 Min-No Report  Result Date: 05/17/2020 Fluoroscopy was utilized by the requesting physician.  No radiographic interpretation.       Assessment / Plan:     #1 74 yo female with Left ureteral stone with obstruction /hydronephrosis and sepsis  S/P stent placement 05/17/2020. Creatinine has improved Patient continues to complain of left back pain. Will need surgery for stone removal  #2 weakness and confusion on admission secondary to above #3 history of indeterminate colitis-patient on Humira, low-dose azathioprine and Lialda as an outpatient.  She had just completed a course of steroids, has had increase in diarrhea over the past month  Stool studies all negative. Fecal calprotectin pending Adalimumab antibody and trough level pending  Diarrhea may be worse currently secondary to antibiotics. Advance to full liquid diet, then solid diet as tolerated. No changes from GI perspective until pending labs result.   will arrange for outpatient follow-up with Dr. [name] in a few weeks.    Principal Problem:   Sepsis ([place]) Active Problems:   AKI (acute kidney injury) ([place])   Diarrhea   IBD (inflammatory bowel disease)   Immunosuppression ([place])     LOS: 2 days   Niza Soderholm EsterwoodPA-C  05/19/2020, 9:16 AM

## 2020-05-20 ENCOUNTER — Inpatient Hospital Stay (HOSPITAL_COMMUNITY): Payer: Medicare HMO

## 2020-05-20 DIAGNOSIS — N390 Urinary tract infection, site not specified: Secondary | ICD-10-CM

## 2020-05-20 DIAGNOSIS — N179 Acute kidney failure, unspecified: Secondary | ICD-10-CM | POA: Diagnosis not present

## 2020-05-20 DIAGNOSIS — A419 Sepsis, unspecified organism: Secondary | ICD-10-CM | POA: Diagnosis not present

## 2020-05-20 DIAGNOSIS — F419 Anxiety disorder, unspecified: Secondary | ICD-10-CM | POA: Diagnosis not present

## 2020-05-20 DIAGNOSIS — A4150 Gram-negative sepsis, unspecified: Secondary | ICD-10-CM

## 2020-05-20 DIAGNOSIS — R0902 Hypoxemia: Secondary | ICD-10-CM | POA: Diagnosis not present

## 2020-05-20 DIAGNOSIS — K529 Noninfective gastroenteritis and colitis, unspecified: Secondary | ICD-10-CM | POA: Diagnosis not present

## 2020-05-20 DIAGNOSIS — A498 Other bacterial infections of unspecified site: Secondary | ICD-10-CM | POA: Diagnosis not present

## 2020-05-20 DIAGNOSIS — R7881 Bacteremia: Secondary | ICD-10-CM | POA: Diagnosis not present

## 2020-05-20 DIAGNOSIS — E8770 Fluid overload, unspecified: Secondary | ICD-10-CM

## 2020-05-20 LAB — MAGNESIUM: Magnesium: 1.5 mg/dL — ABNORMAL LOW (ref 1.7–2.4)

## 2020-05-20 LAB — CULTURE, BLOOD (ROUTINE X 2)

## 2020-05-20 LAB — CBC WITH DIFFERENTIAL/PLATELET
Abs Immature Granulocytes: 0.09 10*3/uL — ABNORMAL HIGH (ref 0.00–0.07)
Basophils Absolute: 0 10*3/uL (ref 0.0–0.1)
Basophils Relative: 0 %
Eosinophils Absolute: 0.1 10*3/uL (ref 0.0–0.5)
Eosinophils Relative: 1 %
HCT: 34.2 % — ABNORMAL LOW (ref 36.0–46.0)
Hemoglobin: 10.6 g/dL — ABNORMAL LOW (ref 12.0–15.0)
Immature Granulocytes: 1 %
Lymphocytes Relative: 11 %
Lymphs Abs: 1.3 10*3/uL (ref 0.7–4.0)
MCH: 23.2 pg — ABNORMAL LOW (ref 26.0–34.0)
MCHC: 31 g/dL (ref 30.0–36.0)
MCV: 74.8 fL — ABNORMAL LOW (ref 80.0–100.0)
Monocytes Absolute: 0.5 10*3/uL (ref 0.1–1.0)
Monocytes Relative: 4 %
Neutro Abs: 9.8 10*3/uL — ABNORMAL HIGH (ref 1.7–7.7)
Neutrophils Relative %: 83 %
Platelets: 99 10*3/uL — ABNORMAL LOW (ref 150–400)
RBC: 4.57 MIL/uL (ref 3.87–5.11)
RDW: 19.8 % — ABNORMAL HIGH (ref 11.5–15.5)
WBC: 11.7 10*3/uL — ABNORMAL HIGH (ref 4.0–10.5)
nRBC: 0 % (ref 0.0–0.2)

## 2020-05-20 LAB — FERRITIN: Ferritin: 154 ng/mL (ref 11–307)

## 2020-05-20 LAB — RENAL FUNCTION PANEL
Albumin: 2.5 g/dL — ABNORMAL LOW (ref 3.5–5.0)
Anion gap: 16 — ABNORMAL HIGH (ref 5–15)
BUN: 18 mg/dL (ref 8–23)
CO2: 24 mmol/L (ref 22–32)
Calcium: 8.3 mg/dL — ABNORMAL LOW (ref 8.9–10.3)
Chloride: 103 mmol/L (ref 98–111)
Creatinine, Ser: 1.34 mg/dL — ABNORMAL HIGH (ref 0.44–1.00)
GFR, Estimated: 39 mL/min — ABNORMAL LOW (ref >60)
Glucose, Bld: 94 mg/dL (ref 70–99)
Phosphorus: 2.7 mg/dL (ref 2.5–4.6)
Potassium: 3.4 mmol/L — ABNORMAL LOW (ref 3.5–5.1)
Sodium: 143 mmol/L (ref 135–145)

## 2020-05-20 LAB — IRON AND TIBC
Iron: 17 ug/dL — ABNORMAL LOW (ref 28–170)
Saturation Ratios: 8 % — ABNORMAL LOW (ref 10.4–31.8)
TIBC: 211 ug/dL — ABNORMAL LOW (ref 250–450)
UIBC: 194 ug/dL

## 2020-05-20 LAB — FOLATE: Folate: 13.7 ng/mL (ref >5.9)

## 2020-05-20 LAB — VITAMIN B12: Vitamin B-12: 2452 pg/mL — ABNORMAL HIGH (ref 180–914)

## 2020-05-20 LAB — BRAIN NATRIURETIC PEPTIDE: B Natriuretic Peptide: 593.7 pg/mL — ABNORMAL HIGH (ref 0.0–100.0)

## 2020-05-20 MED ORDER — IOHEXOL 9 MG/ML PO SOLN
500.0000 mL | ORAL | Status: AC
  Administered 2020-05-20: 500 mL via ORAL

## 2020-05-20 MED ORDER — CEFAZOLIN SODIUM-DEXTROSE 2-4 GM/100ML-% IV SOLN
2.0000 g | Freq: Three times a day (TID) | INTRAVENOUS | Status: DC
  Filled 2020-05-20: qty 100

## 2020-05-20 MED ORDER — FUROSEMIDE 10 MG/ML IJ SOLN
20.0000 mg | Freq: Once | INTRAMUSCULAR | Status: AC
  Administered 2020-05-20: 20 mg via INTRAVENOUS
  Filled 2020-05-20: qty 2

## 2020-05-20 MED ORDER — OXYCODONE-ACETAMINOPHEN 5-325 MG PO TABS
1.0000 | ORAL_TABLET | ORAL | Status: DC | PRN
  Administered 2020-05-20 – 2020-05-24 (×4): 1 via ORAL
  Filled 2020-05-20 (×4): qty 1

## 2020-05-20 MED ORDER — IOHEXOL 9 MG/ML PO SOLN
ORAL | Status: AC
  Filled 2020-05-20: qty 1000

## 2020-05-20 MED ORDER — POTASSIUM CHLORIDE CRYS ER 20 MEQ PO TBCR
40.0000 meq | EXTENDED_RELEASE_TABLET | Freq: Once | ORAL | Status: AC
  Administered 2020-05-20: 40 meq via ORAL
  Filled 2020-05-20: qty 2

## 2020-05-20 MED ORDER — SODIUM CHLORIDE 0.9 % IV SOLN
2.0000 g | INTRAVENOUS | Status: DC
  Administered 2020-05-20: 2 g via INTRAVENOUS
  Filled 2020-05-20: qty 2

## 2020-05-20 MED ORDER — MAGNESIUM SULFATE 4 GM/100ML IV SOLN
4.0000 g | Freq: Once | INTRAVENOUS | Status: AC
  Administered 2020-05-20: 4 g via INTRAVENOUS
  Filled 2020-05-20 (×2): qty 100

## 2020-05-20 MED ORDER — FUROSEMIDE 10 MG/ML IJ SOLN
20.0000 mg | Freq: Two times a day (BID) | INTRAMUSCULAR | Status: DC
  Administered 2020-05-20: 20 mg via INTRAVENOUS
  Filled 2020-05-20: qty 2

## 2020-05-20 MED ORDER — MESALAMINE 1.2 G PO TBEC
4.8000 g | DELAYED_RELEASE_TABLET | Freq: Every day | ORAL | Status: DC
  Administered 2020-05-22 – 2020-05-24 (×3): 4.8 g via ORAL
  Filled 2020-05-20 (×4): qty 4

## 2020-05-20 NOTE — Care Management Important Message (Signed)
Important Message  Patient Details IM Letter given to the Patient Name: Rhonda Bailey MRN: [id number] Date of Birth: [birth year]   Medicare Important Message Given:  Yes     Kerin Salen 05/20/2020, 10:50 AM

## 2020-05-20 NOTE — Progress Notes (Signed)
Patient ID: Rhonda Bailey, female   DOB: 06/09/1946, 74 y.o.   MRN: [id number]    Progress Note   Subjective   day # 4  CC; Sepsis/ left ureteral stone/ IBD  Patient continues to feel poorly, she developed hypoxia last night and is now requiring 5 L nasal O2.  Also febrile last night..  She says she is uncomfortable, hurts all over continuing to complain of back pain left greater than right  BNP elevated at 593 Iron 17/TIBC 211/iron sat 8 WBC 11.7, hemoglobin 10.6, platelets 99 Creatinine 1.34/albumin 2.5  BC+ proteus  Humira AB- pend   Objective   Vital signs in last 24 hours: Temp:  [98.3 F (36.8 C)-100.8 F (38.2 C)] 98.3 F (36.8 C) (10/14 0941) Pulse Rate:  [97-116] 116 (10/14 0942) Resp:  [18-25] 24 (10/14 0941) BP: (116-156)/(52-84) 143/84 (10/14 0941) SpO2:  [85 %-94 %] 93 % (10/14 0942) Last BM Date: 05/19/20 General:    Ill appearing older white female in NAD Flushed Heart:  tachyRegular rate and rhythm; no murmurs Lungs: Respirations even bibasilar Rales Abdomen:  Soft, nontender and nondistended. Normal bowel sounds. Extremities:  Without edema. Neurologic:  Alert and oriented,  grossly normal neurologically. Psych:  Cooperative.   Intake/Output from previous day: 10/13 0701 - 10/14 0700 In: 1791.6 [I.V.:1691.6; IV Piggyback:100] Out: 3650 [Urine:3650] Intake/Output this shift: Total I/O In: 0  Out: 650 [Urine:650]  Lab Results: Recent Labs    05/18/20 0746 05/19/20 0455 05/20/20 0638  WBC 20.8* 21.1* 11.7*  HGB 9.8* 9.9* 10.6*  HCT 33.1* 33.0* 34.2*  PLT 153 132* 99*   BMET Recent Labs    05/18/20 0930 05/19/20 0455 05/20/20 0638  NA 140 143 143  K 4.7 4.2 3.4*  CL 111 115* 103  CO2 17* 16* 24  GLUCOSE 147* 143* 94  BUN 42* 32* 18  CREATININE 2.55* 1.61* 1.34*  CALCIUM 8.2* 8.7* 8.3*   LFT Recent Labs    05/20/20 0638  ALBUMIN 2.5*   PT/INR Recent Labs    05/17/20 1448  LABPROT 16.6*  INR 1.4*    Studies/Results:  DG CHEST PORT 1 VIEW  Result Date: 05/19/2020 CLINICAL DATA:  Hypoxia EXAM: PORTABLE CHEST 1 VIEW COMPARISON:  05/17/2020 FINDINGS: Cardiac shadow is stable. Aortic calcifications are seen. Mild vascular congestion is seen without edema. No focal infiltrate is seen. No pneumothorax is noted. IMPRESSION: Mild central vascular congestion without interstitial edema. Electronically Signed   By: Inez Catalina M.D.   On: 05/19/2020 22:23       Assessment / Plan:    #1 74 yo female with urinary sepsis/Proteus bacteremia secondary to left ureteral stone.  She is status post stent placement 3 days ago. She has not improved since stent placement though renal function has improved. Now with hypoxia, persistent fever. Rule out pyelonephritis, rule out abscess.  Stone likely needs to come out much sooner than initially planned 2 to 3 weeks. Discussed course with Dr. Grandville Silos this morning. He will pursue CT of the abdomen and pelvis and urology follow-up.  #2 indeterminate colitis-patient had been on Humira, low-dose azathioprine and low-dose Lialda as an outpatient.  She also completed a recent course of steroids due to complaints of increase in diarrhea over the past month or so.  Infectious work-up has been negative, fecal calprotectin markedly elevated consistent with active disease. Adalimumab antibody and trough level are pending, await results to help determine whether adalimumab should be continued.  She is due for Humira  tomorrow, Humira should be held until urosepsis completely resolved. We could consider adding budesonide 9 mg daily to bridge her, and can increase Lialda to 4.8 g daily.  Persistent urosepsis concerning and most important issue at present.   Principal Problem:   Sepsis ([place]) Active Problems:   AKI (acute kidney injury) ([place])   Diarrhea   IBD (inflammatory bowel disease)   Immunosuppression ([place])   Bacteremia   Abnormal CT scan, gastrointestinal tract   Acute renal  failure ([place])   Anxiety     LOS: 3 days   Rhonda Butrick PA-C 05/20/2020, 11:51 AM

## 2020-05-20 NOTE — Progress Notes (Signed)
3 Days Post-Op Subjective: Called by primary team for patient with continued fevers and worsening respiratory function.  Recommended CT which showed resolution of hydronephrosis and stent in correct position.    Objective: Vital signs in last 24 hours: Temp:  [98.1 F (36.7 C)-100.8 F (38.2 C)] 98.1 F (36.7 C) (10/14 1337) Pulse Rate:  [94-116] 94 (10/14 1337) Resp:  [18-25] 18 (10/14 1337) BP: (106-156)/(52-84) 106/80 (10/14 1337) SpO2:  [85 %-97 %] 85 % (10/14 1439)  Intake/Output from previous day: 10/13 0701 - 10/14 0700 In: 1791.6 [I.V.:1691.6; IV Piggyback:100] Out: 3650 [Urine:3650] Intake/Output this shift: Total I/O In: 100 [IV Piggyback:100] Out: 2860 [Urine:2860]  Physical Exam:  General: Alert and oriented CV: RRR Lungs: Clear Abdomen: Soft, ND Incisions: Ext: NT, No erythema  Lab Results: Recent Labs    05/18/20 0746 05/19/20 0455 05/20/20 0638  HGB 9.8* 9.9* 10.6*  HCT 33.1* 33.0* 34.2*   BMET Recent Labs    05/19/20 0455 05/20/20 0638  NA 143 143  K 4.2 3.4*  CL 115* 103  CO2 16* 24  GLUCOSE 143* 94  BUN 32* 18  CREATININE 1.61* 1.34*  CALCIUM 8.7* 8.3*     Studies/Results: CT ABDOMEN PELVIS WO CONTRAST  Result Date: 05/20/2020 CLINICAL DATA:  Patient diagnosed with inflammatory bowel disease by colonoscopy 12/04/2019. The patient was also diagnosed with a left ureteral stone 05/17/2020. Nausea, vomiting and diarrhea for a little over 1 week. EXAM: CT ABDOMEN AND PELVIS WITHOUT CONTRAST TECHNIQUE: Multidetector CT imaging of the abdomen and pelvis was performed following the standard protocol without IV contrast. COMPARISON:  CT abdomen and pelvis 05/17/2020. FINDINGS: Lower chest: Trace bilateral pleural effusions and mild dependent atelectasis, slightly increased since the prior CT. Hepatobiliary: Status post cholecystectomy. A few small calcifications in the liver are again seen. The liver is low attenuating consistent with fatty  infiltration. Biliary tree is negative. Pancreas: Fatty replacement again seen.  Otherwise negative. Spleen: Normal in size without focal abnormality. Adrenals/Urinary Tract: Since the prior examination, the patient has undergone placement of a left ureteral stent which is in good position. Previously seen left ureteral stone is no longer identified and hydronephrosis has resolved. 3-4 punctate left renal stones are seen. There are no right renal stones or hydronephrosis. Atrophy of the right kidney relative to the left noted. Right ureter appears normal. The urinary bladder is decompressed with a Foley catheter in place. Likely left adrenal adenoma noted. The right adrenal gland is unremarkable Stomach/Bowel: Inflammatory change of the descending colon persists but appear mildly improved. Scattered diverticular disease is noted. No pneumatosis or portal venous gas. Small hiatal hernia again seen. The stomach and small bowel are unremarkable. The appendix is normal in appearance. Vascular/Lymphatic: Aortic atherosclerosis. No enlarged abdominal or pelvic lymph nodes. Reproductive: Uterus and bilateral adnexa are unremarkable. Other: Tiny fat containing umbilical hernia noted. There is new stranding in subcutaneous fat along the left side of the abdomen. Musculoskeletal: No acute abnormality. L1 and L2 compression fractures seen on the prior study noted. Advanced bilateral hip osteoarthritis is also present. IMPRESSION: There is new stranding in subcutaneous fat of the left abdomen which may be due to dependent change but is nonspecific. Trace bilateral pleural effusions and dependent atelectasis have mildly increased since the prior CT. Resolved left hydronephrosis with a new double-J ureteral stone in good position. 3-4 punctate left renal stones are again seen. Some improvement of inflammatory change about the descending colon. Fatty infiltration of the liver. Aortic Atherosclerosis (ICD10-I70.0). Electronically  Signed   By: Inge Rise M.D.   On: 05/20/2020 15:11   DG CHEST PORT 1 VIEW  Result Date: 05/19/2020 CLINICAL DATA:  Hypoxia EXAM: PORTABLE CHEST 1 VIEW COMPARISON:  05/17/2020 FINDINGS: Cardiac shadow is stable. Aortic calcifications are seen. Mild vascular congestion is seen without edema. No focal infiltrate is seen. No pneumothorax is noted. IMPRESSION: Mild central vascular congestion without interstitial edema. Electronically Signed   By: Inez Catalina M.D.   On: 05/19/2020 22:23    Assessment/Plan: 74 year old woman who presented to [facility], ER with abdominal pain found to have a 7 mm left proximal ureteral calculus with hydronephrosis and clinical signs of sepsis now POD3 status post left ureteral stent placement.  -repeat scan shows appropriate position of stent and resolution of hydronephrosis -Patient will continue stent and have definitive stone removal surgery scheduled in approximately 2 to 3 weeks -Continue antibiotics per culture sensitivities   LOS: 3 days   Johanna Stafford D Sequoyah Ramone 05/20/2020, 5:01 PM

## 2020-05-20 NOTE — Progress Notes (Addendum)
PROGRESS NOTE    CATHA ONTKO  NGE:[id number] DOB: 01-26-1946 DOA: 05/17/2020 PCP: [name], MD   Chief Complaint  Patient presents with  . Weakness    Brief Narrative:  Rhonda Bailey a 74 y.o.femalewith medical history ofinflammatory bowel disease initially diagnosed via colonoscopy on 12/04/2019. The patient also has history of hypothyroidism, anxiety, and iron deficiency anemia. The patient has been receiving Humira under the guidance of Dr. [name].In addition, the patient has been on Lialda and azathioprine as well as a prednisone taper for her inflammatory bowel disease. She is down to prednisone 5 mg daily with which she is due to finish in 3 days. She presents with 1 week history of nausea, vomiting, and diarrhea. The patient has been having 8 loose stools on a daily basis without any hematochezia. She has having some black stools, but she is taking iron. Because of her vomiting, the patient states that she has not been able to keep down her medications for about 48 hours prior to this admission. She has had increasing generalized weakness and difficulty getting out of bed today.   In the emergency department, the patient was afebrile with hypotension initially 81/68. She was started on IV fluids with some improvement of her blood pressure. BMP showed a serum creatinine of 4.33 which is above her usual baseline. LFTs were unremarkable. WBC 36.3, hemoglobin 10.8, platelets 215,000. Hospitalist was contacted for admission for further evaluation and treatment. She has been fully vaccinated for COVID with Moderna.  Worked up revealed left ureteral stone. She was transferred to [facility] and underwent stent placement by urology, Dr. [name].  GI was also consulted.    Assessment & Plan:   Principal Problem:   Sepsis ([place]) Active Problems:   Hypoxia   Bacterial infection due to Proteus mirabilis   AKI (acute kidney injury) ([place])    Diarrhea   IBD (inflammatory bowel disease)   Immunosuppression (HCC)   Bacteremia   Abnormal CT scan, gastrointestinal tract   Acute renal failure (HCC)   Anxiety   Sepsis due to gram-negative urinary tract infection (HCC)   Hypervolemia  1 severe sepsis secondary to obstructive urethral stone and Proteus bacteremia, POA Patient on admission met criteria for sepsis with tachycardia, significant leukocytosis, hypotension which was responsive to IV fluids and IV antibiotics, acute renal failure.  CT abdomen and pelvis done concerning for obstructive left ureteral stone with hydroureteronephrosis and wall thickening involving much of the colon concerning for infectious versus inflammatory colitis.  Patient was seen in consultation by urology and patient status post left ureteral stent placement 05/17/2020 per Dr. [name].  Blood cultures positive for Proteus Mirabilis which is pansensitive.  Patient with improvement with leukocytosis.  Patient with temp of 100.8 overnight, patient looks flushed, patient feeling terrible with ongoing bilateral CVA tenderness.  We will get a CT abdomen and pelvis.  Continue IV Rocephin pending CT findings.  Urology will assess patient later on today.  Supportive care.  Follow.  2.  Hypoxia secondary to volume overload Patient noted to be hypoxic overnight with some lethargy per RN, patient currently with sats of 93% on 5 L nasal cannula.  Patient volume overloaded on examination.  Patient aggressively hydrated on presentation.  Patient noted to be +2.121 L during this hospitalization.  Chest x-ray done last night concerning for congestion.  Saline lock IV fluids.  Placed on Lasix 20 mg IV every 12 hours x4 doses.  Strict I's and O's.  Daily weights.  Follow.  3.  Inflammatory bowel disease CT abdomen and pelvis revealed colonic wall thickening as well as diverticulosis.  Patient presented with nausea vomiting and diarrhea.  Nausea vomiting improved however still with  ongoing diarrhea.  Patient noted to be on Humira, azathioprine, Lialda, steroid taper in the outpatient setting.  C. difficile PCR which was done was negative.  Fecal calprotectin elevated at 725.  GI consulted and are following and due to elevated calprotectin, concern for inflammatory component to her diarrhea in the setting of IBD.  Mesalamine has been increased to 4.8 g daily per GI recommendations.  GI holding off on steroid treatment or resumption of Humira at this time due to active infection.  Continue azathioprine.  Humira level and antibody test pending.  Diet advanced to a soft diet.  Per GI.   4.  Acute renal failure/metabolic acidosis Secondary to problem #1/post renal azotemia secondary to obstructing ureteral stone.  Status post urethral stent placement.  Urine output of 3.650 L over the past 24 hours.  Renal function improving.  Concern for volume overload secondary to fluid resuscitation and as such saline lock IV fluids.  Strict I's and O's.  Supportive care.   5.  Hypothyroidism Continue Synthroid.   6.  Anxiety Continue trazodone, alprazolam.  7.  Anemia of chronic disease/iron deficiency anemia Likely dilutional in nature.  Patient with no overt bleeding.  Hemoglobin currently stable at 10.6.  Anemia panel consistent with iron deficiency anemia/anemia of chronic disease.  Transfusion threshold hemoglobin  < 7.  8.  Hypokalemia/hypomagnesemia Likely secondary to GI losses.  Magnesium sulfate 4 g IV x1.  K. Dur 40 mEq p.o. x1.   DVT prophylaxis: Heparin Code Status: Full Family Communication: Updated patient.  No family at bedside. Disposition:   Status is: Inpatient    Dispo: The patient is from: Home              Anticipated d/c is to: Home              Anticipated d/c date is: 4-5 days.              Patient currently patient with bacteremia likely seeded from the urine, status post stent placement for urethral stone, on IV antibiotics, bilateral CVA tenderness,  diarrhea.  Not stable for discharge.        Consultants:   Gastroenterology: Dr. Hilarie Fredrickson 05/18/2020  Urology: Dr. [name] 05/17/2020    Procedures:   CT abdomen and pelvis 05/17/2020, 05/20/2020 pending  Chest x-ray 05/17/2020  Cystoscopy/left urethral stent placement/left retrograde pyelography with interpretation per Dr. [name], urology 05/17/2020  Antimicrobials:  IV Rocephin 05/17/2020>>>     Subjective: Patient laying in bed.  States she feels terrible.  Complain of bilateral flank pain.  Denies any chest pain.  Complains of shortness of breath.  Patient noted to be hypoxic with some lethargy overnight with increased O2 requirements.  Patient with fever 100.8 overnight.  States pain medication helping manage pain.   Objective: Vitals:   05/20/20 0801 05/20/20 0941 05/20/20 0942 05/20/20 1306  BP:  (!) 143/84    Pulse:  (!) 115 (!) 116   Resp: (!) 25 (!) 24  18  Temp:  98.3 F (36.8 C)    TempSrc:      SpO2: 92% (!) 89% 93% 97%  Weight:      Height:        Intake/Output Summary (Last 24 hours) at 05/20/2020 1333 Last data filed at 05/20/2020 2993 Gross  per 24 hour  Intake 1791.57 ml  Output 4300 ml  Net -2508.43 ml   Filed Weights   05/17/20 0936  Weight: 73.9 kg    Examination:  General exam: NAD.  Flushed. Respiratory system: Diffuse crackles.  Fair air movement.  No wheezing.  Speaking in full sentences.  Cardiovascular system: Tachycardia.  No JVD.  No lower extremity edema. Gastrointestinal system: Bilateral CVA tenderness.  Abdomen nondistended, soft, positive bowel sounds.  No rebound.  No guarding.  Central nervous system: Alert and oriented.  No focal neurological deficits.  Moving extremities spontaneously.   Extremities: Symmetric 5 x 5 power. Skin: No rashes, lesions or ulcers Psychiatry: Judgement and insight appear normal. Mood & affect appropriate.     Data Reviewed: I have personally reviewed following labs and imaging  studies  CBC: Recent Labs  Lab 05/17/20 1015 05/18/20 0746 05/19/20 0455 05/20/20 0638  WBC 36.3* 20.8* 21.1* 11.7*  NEUTROABS  --   --   --  9.8*  HGB 10.8* 9.8* 9.9* 10.6*  HCT 36.1 33.1* 33.0* 34.2*  MCV 75.8* 78.1* 76.0* 74.8*  PLT 215 153 132* 99*    Basic Metabolic Panel: Recent Labs  Lab 05/17/20 1015 05/18/20 0930 05/19/20 0455 05/20/20 0638  NA 136 140 143 143  K 3.9 4.7 4.2 3.4*  CL 103 111 115* 103  CO2 20* 17* 16* 24  GLUCOSE 116* 147* 143* 94  BUN 45* 42* 32* 18  CREATININE 4.33* 2.55* 1.61* 1.34*  CALCIUM 7.9* 8.2* 8.7* 8.3*  MG  --   --   --  1.5*  PHOS  --   --   --  2.7    GFR: Estimated Creatinine Clearance: 37.1 mL/min (A) (by C-G formula based on SCr of 1.34 mg/dL (H)).  Liver Function Tests: Recent Labs  Lab 05/17/20 1015 05/20/20 0638  AST 32  --   ALT 32  --   ALKPHOS 130*  --   BILITOT 0.8  --   PROT 5.9*  --   ALBUMIN 2.8* 2.5*    CBG: No results for input(s): GLUCAP in the last 168 hours.   Recent Results (from the past 240 hour(s))  Respiratory Panel by RT PCR (Flu A&B, Covid) - Nasopharyngeal Swab     Status: None   Collection Time: 05/17/20 11:21 AM   Specimen: Nasopharyngeal Swab  Result Value Ref Range Status   SARS Coronavirus 2 by RT PCR NEGATIVE NEGATIVE Final    Comment: (NOTE) SARS-CoV-2 target nucleic acids are NOT DETECTED.  The SARS-CoV-2 RNA is generally detectable in upper respiratoy specimens during the acute phase of infection. The lowest concentration of SARS-CoV-2 viral copies this assay can detect is 131 copies/mL. A negative result does not preclude SARS-Cov-2 infection and should not be used as the sole basis for treatment or other patient management decisions. A negative result may occur with  improper specimen collection/handling, submission of specimen other than nasopharyngeal swab, presence of viral mutation(s) within the areas targeted by this assay, and inadequate number of viral  copies (<131 copies/mL). A negative result must be combined with clinical observations, patient history, and epidemiological information. The expected result is Negative.  Fact Sheet for Patients:  PinkCheek.be  Fact Sheet for Healthcare Providers:  GravelBags.it  This test is no t yet approved or cleared by the [place] FDA and  has been authorized for detection and/or diagnosis of SARS-CoV-2 by FDA under an Emergency Use Authorization (EUA). This EUA will remain  in effect (  meaning this test can be used) for the duration of the COVID-19 declaration under Section 564(b)(1) of the Act, 21 U.S.C. section 360bbb-3(b)(1), unless the authorization is terminated or revoked sooner.     Influenza A by PCR NEGATIVE NEGATIVE Final   Influenza B by PCR NEGATIVE NEGATIVE Final    Comment: (NOTE) The Xpert Xpress SARS-CoV-2/FLU/RSV assay is intended as an aid in  the diagnosis of influenza from Nasopharyngeal swab specimens and  should not be used as a sole basis for treatment. Nasal washings and  aspirates are unacceptable for Xpert Xpress SARS-CoV-2/FLU/RSV  testing.  Fact Sheet for Patients: PinkCheek.be  Fact Sheet for Healthcare Providers: GravelBags.it  This test is not yet approved or cleared by the [place] FDA and  has been authorized for detection and/or diagnosis of SARS-CoV-2 by  FDA under an Emergency Use Authorization (EUA). This EUA will remain  in effect (meaning this test can be used) for the duration of the  Covid-19 declaration under Section 564(b)(1) of the Act, 21  U.S.C. section 360bbb-3(b)(1), unless the authorization is  terminated or revoked. Performed at [facility], [address], [place], [place] [address]   Culture, blood (Routine X 2) w Reflex to ID Panel     Status: Abnormal   Collection Time: 05/17/20 12:48 PM   Specimen: BLOOD   Result Value Ref Range Status   Specimen Description   Final    BLOOD RIGHT ANTECUBITAL Performed at [place] [place], [place] [address]    Special Requests   Final    BOTTLES DRAWN AEROBIC AND ANAEROBIC Blood Culture results may not be optimal due to an inadequate volume of blood received in culture bottles Performed at [place] [place],  [address]    Culture  Setup Time   Final    IN BOTH AEROBIC AND ANAEROBIC BOTTLES GRAM NEGATIVE RODS PATIENT DISCHARGED OR EXPIRED Gram Stain Report Called to,Read Back By and Verified With: ONAEGBUAM,I RN @ [facility] @0730  05/18/2020 BY MATTHEWS, B     Culture PROTEUS MIRABILIS (A)  Final   Report Status 05/20/2020 FINAL  Final   Organism ID, Bacteria PROTEUS MIRABILIS  Final      Susceptibility   Proteus mirabilis - MIC*    AMPICILLIN <=2 SENSITIVE Sensitive     CEFAZOLIN <=4 SENSITIVE Sensitive     CEFEPIME <=0.12 SENSITIVE Sensitive     CEFTAZIDIME <=1 SENSITIVE Sensitive     CEFTRIAXONE <=0.25 SENSITIVE Sensitive     CIPROFLOXACIN <=0.25 SENSITIVE Sensitive     GENTAMICIN <=1 SENSITIVE Sensitive     IMIPENEM 2 SENSITIVE Sensitive     TRIMETH/SULFA <=20 SENSITIVE Sensitive     AMPICILLIN/SULBACTAM <=2 SENSITIVE Sensitive     PIP/TAZO <=4 SENSITIVE Sensitive     * PROTEUS MIRABILIS  Blood Culture ID Panel (Reflexed)     Status: Abnormal   Collection Time: 05/17/20 12:48 PM  Result Value Ref Range Status   Enterococcus faecalis NOT DETECTED NOT DETECTED Final   Enterococcus Faecium NOT DETECTED NOT DETECTED Final   Listeria monocytogenes NOT DETECTED NOT DETECTED Final   Staphylococcus species NOT DETECTED NOT DETECTED Final   Staphylococcus aureus (BCID) NOT DETECTED NOT DETECTED Final   Staphylococcus epidermidis NOT DETECTED NOT DETECTED Final   Staphylococcus lugdunensis NOT DETECTED NOT DETECTED Final   Streptococcus species NOT DETECTED NOT DETECTED Final   Streptococcus agalactiae NOT  DETECTED NOT DETECTED Final   Streptococcus pneumoniae NOT DETECTED NOT DETECTED Final  Streptococcus pyogenes NOT DETECTED NOT DETECTED Final   A.calcoaceticus-baumannii NOT DETECTED NOT DETECTED Final   Bacteroides fragilis NOT DETECTED NOT DETECTED Final   Enterobacterales DETECTED (A) NOT DETECTED Final    Comment: Enterobacterales represent a large order of gram negative bacteria, not a single organism. CRITICAL RESULT CALLED TO, READ BACK BY AND VERIFIED WITH: Guadlupe Spanish PharmD 10:30 05/18/20 ([email or web address])    Enterobacter cloacae complex NOT DETECTED NOT DETECTED Final   Escherichia coli NOT DETECTED NOT DETECTED Final   Klebsiella aerogenes NOT DETECTED NOT DETECTED Final   Klebsiella oxytoca NOT DETECTED NOT DETECTED Final   Klebsiella pneumoniae NOT DETECTED NOT DETECTED Final   Proteus species DETECTED (A) NOT DETECTED Final    Comment: CRITICAL RESULT CALLED TO, READ BACK BY AND VERIFIED WITH: Guadlupe Spanish PharmD 10:30 05/18/20 ([email or web address])    Salmonella species NOT DETECTED NOT DETECTED Final   Serratia marcescens NOT DETECTED NOT DETECTED Final   Haemophilus influenzae NOT DETECTED NOT DETECTED Final   Neisseria meningitidis NOT DETECTED NOT DETECTED Final   Pseudomonas aeruginosa NOT DETECTED NOT DETECTED Final   Stenotrophomonas maltophilia NOT DETECTED NOT DETECTED Final   Candida albicans NOT DETECTED NOT DETECTED Final   Candida auris NOT DETECTED NOT DETECTED Final   Candida glabrata NOT DETECTED NOT DETECTED Final   Candida krusei NOT DETECTED NOT DETECTED Final   Candida parapsilosis NOT DETECTED NOT DETECTED Final   Candida tropicalis NOT DETECTED NOT DETECTED Final   Cryptococcus neoformans/gattii NOT DETECTED NOT DETECTED Final   CTX-M ESBL NOT DETECTED NOT DETECTED Final   Carbapenem resistance IMP NOT DETECTED NOT DETECTED Final   Carbapenem resistance KPC NOT DETECTED NOT DETECTED Final   Carbapenem resistance NDM NOT DETECTED NOT DETECTED Final   Carbapenem  resist OXA 48 LIKE NOT DETECTED NOT DETECTED Final   Carbapenem resistance VIM NOT DETECTED NOT DETECTED Final    Comment: Performed at [facility], [address] [place], [place] [address]  Culture, blood (Routine X 2) w Reflex to ID Panel     Status: Abnormal   Collection Time: 05/17/20  1:46 PM   Specimen: BLOOD  Result Value Ref Range Status   Specimen Description   Final    BLOOD Performed at [facility], [address], [place], [place] [address]    Special Requests   Final    NONE Performed at [facility], [address] [place], [place] [address]    Culture  Setup Time   Final    AEROBIC BOTTLE ONLY GRAM NEGATIVE RODS PATIENT DISCHARGED OR EXPIRED ANAEROBIC BOTTLE GRAM NEGATIVE RODS [facility] RN AT [facility]@0730  BY Santiago Glad 10.12.2021   Performed at [facility], [address] [place], [place] [address]    Culture (A)  Final    PROTEUS MIRABILIS SUSCEPTIBILITIES PERFORMED ON PREVIOUS CULTURE WITHIN THE LAST 5 DAYS. Performed at [facility], [place] [address], [place], [place] [address]    Report Status 05/20/2020 FINAL  Final  C Difficile Quick Screen w PCR reflex     Status: None   Collection Time: 05/18/20  4:18 AM  Result Value Ref Range Status   C Diff antigen NEGATIVE NEGATIVE Final   C Diff toxin NEGATIVE NEGATIVE Final   C Diff interpretation No C. difficile detected.  Final    Comment: Performed at [facility], [place] [address] [place], [place] [address]  Gastrointestinal Panel by PCR , Stool     Status: None   Collection Time:  05/18/20  4:18 AM  Result Value Ref Range Status   Campylobacter species NOT DETECTED NOT DETECTED Final   Plesimonas shigelloides NOT DETECTED NOT DETECTED Final   Salmonella species NOT DETECTED NOT DETECTED Final   Yersinia enterocolitica NOT DETECTED NOT DETECTED Final   Vibrio species NOT DETECTED NOT DETECTED Final   Vibrio cholerae NOT DETECTED NOT DETECTED Final   Enteroaggregative E coli  (EAEC) NOT DETECTED NOT DETECTED Final   Enteropathogenic E coli (EPEC) NOT DETECTED NOT DETECTED Final   Enterotoxigenic E coli (ETEC) NOT DETECTED NOT DETECTED Final   Shiga like toxin producing E coli (STEC) NOT DETECTED NOT DETECTED Final   Shigella/Enteroinvasive E coli (EIEC) NOT DETECTED NOT DETECTED Final   Cryptosporidium NOT DETECTED NOT DETECTED Final   Cyclospora cayetanensis NOT DETECTED NOT DETECTED Final   Entamoeba histolytica NOT DETECTED NOT DETECTED Final   Giardia lamblia NOT DETECTED NOT DETECTED Final   Adenovirus F40/41 NOT DETECTED NOT DETECTED Final   Astrovirus NOT DETECTED NOT DETECTED Final   Norovirus GI/GII NOT DETECTED NOT DETECTED Final   Rotavirus A NOT DETECTED NOT DETECTED Final   Sapovirus (I, II, IV, and V) NOT DETECTED NOT DETECTED Final    Comment: Performed at [facility], [address], [place], [place] [address]  Urine Culture     Status: None   Collection Time: 05/18/20  4:18 AM   Specimen: Urine, Clean Catch  Result Value Ref Range Status   Specimen Description   Final    URINE, CLEAN CATCH Performed at [facility], [place] [address] [place], [place] [address]    Special Requests   Final    NONE Performed at [facility], [place] [address] [place], [place] [address]    Culture   Final    NO GROWTH Performed at [facility], [place] [address] [place], [place] [address]    Report Status 05/19/2020 FINAL  Final  Calprotectin, Fecal     Status: Abnormal   Collection Time: 05/18/20  1:06 PM   Specimen: Stool  Result Value Ref Range Status   Calprotectin, Fecal 725 (H) 0 - 120 ug/g Final    Comment: (NOTE) Concentration     Interpretation   Follow-Up <16 - 50 ug/g     Normal           None >50 -120 ug/g     Borderline       Re-evaluate in 4-6 weeks    >120 ug/g     Abnormal         Repeat as clinically                                   indicated Performed At: [facility] [place], [place] [id number] Rush Farmer MD RC:[id number]          Radiology Studies: DG CHEST PORT 1 VIEW  Result Date: 05/19/2020 CLINICAL DATA:  Hypoxia EXAM: PORTABLE CHEST 1 VIEW COMPARISON:  05/17/2020 FINDINGS: Cardiac shadow is stable. Aortic calcifications are seen. Mild vascular congestion is seen without edema. No focal infiltrate is seen. No pneumothorax is noted. IMPRESSION: Mild central vascular congestion without interstitial edema. Electronically Signed   By: Inez Catalina M.D.   On: 05/19/2020 22:23        Scheduled Meds: . azaTHIOprine  50 mg Oral Daily  . Chlorhexidine Gluconate Cloth  6  each Topical Daily  . furosemide  20 mg Intravenous Q12H  . heparin  5,000 Units Subcutaneous Q8H  . iohexol      . levothyroxine  37.5 mcg Oral QAC breakfast  . [START ON 05/21/2020] mesalamine  4.8 g Oral Q breakfast  . ondansetron (ZOFRAN) IV  4 mg Intravenous Q6H  . pantoprazole (PROTONIX) IV  40 mg Intravenous Q12H  . potassium chloride  40 mEq Oral Once   Continuous Infusions: . cefTRIAXone (ROCEPHIN)  IV    . magnesium sulfate bolus IVPB       LOS: 3 days    Time spent: 40 minutes    Irine Seal, MD [facility]   To contact the attending provider between 7A-7P or the covering provider during after hours 7P-7A, please log into the web site [email or web address] and access using universal [place] password for that web site. If you do not have the password, please call the hospital operator.  05/20/2020, 1:33 PM

## 2020-05-20 NOTE — Progress Notes (Signed)
   05/19/20 2145  Assess: MEWS Score  ECG Heart Rate (!) 113  Resp (!) 23  Level of Consciousness Alert  SpO2 94 %  O2 Device Nasal Cannula  O2 Flow Rate (L/min) 3 L/min  Assess: MEWS Score  MEWS Temp 1  MEWS Systolic 0  MEWS Pulse 2  MEWS RR 1  MEWS LOC 0  MEWS Score 4  MEWS Score Color Red  Assess: if the MEWS score is Yellow or Red  Were vital signs taken at a resting state? Yes  Focused Assessment Change from prior assessment (see assessment flowsheet)  Early Detection of Sepsis Score *See Row Information* High  MEWS guidelines implemented *See Row Information* Yes  Treat  MEWS Interventions Escalated (See documentation below);Other (Comment) (put on oxygen via nasal cannula)  Take Vital Signs  Increase Vital Sign Frequency  Red: Q 1hr X 4 then Q 4hr X 4, if remains red, continue Q 4hrs  Escalate  MEWS: Escalate Red: discuss with charge nurse/RN and provider, consider discussing with RRT  Notify: Charge Nurse/RN  Name of Charge Nurse/RN Notified [place], Therapist, sports  Notify: Estate agent Name/Title B. Lennox Grumbles, NP  Notification Type Page  Notification Reason Change in status  Response See new orders  Notify: Rapid Response  Name of Rapid Response RN Notified Pamala Hurry, RN  Document  Patient Outcome Stabilized after interventions  (Temp 100.8>100.5>afebrile. Desat to 80s on RA, stabilized with 02.) CXR and ABG obtained.

## 2020-05-21 DIAGNOSIS — A419 Sepsis, unspecified organism: Secondary | ICD-10-CM | POA: Diagnosis not present

## 2020-05-21 DIAGNOSIS — N179 Acute kidney failure, unspecified: Secondary | ICD-10-CM | POA: Diagnosis not present

## 2020-05-21 DIAGNOSIS — K529 Noninfective gastroenteritis and colitis, unspecified: Secondary | ICD-10-CM | POA: Diagnosis not present

## 2020-05-21 DIAGNOSIS — F419 Anxiety disorder, unspecified: Secondary | ICD-10-CM | POA: Diagnosis not present

## 2020-05-21 DIAGNOSIS — A498 Other bacterial infections of unspecified site: Secondary | ICD-10-CM | POA: Diagnosis not present

## 2020-05-21 LAB — CBC WITH DIFFERENTIAL/PLATELET
Abs Immature Granulocytes: 0.07 10*3/uL (ref 0.00–0.07)
Basophils Absolute: 0 10*3/uL (ref 0.0–0.1)
Basophils Relative: 0 %
Eosinophils Absolute: 0.2 10*3/uL (ref 0.0–0.5)
Eosinophils Relative: 2 %
HCT: 31.5 % — ABNORMAL LOW (ref 36.0–46.0)
Hemoglobin: 9.6 g/dL — ABNORMAL LOW (ref 12.0–15.0)
Immature Granulocytes: 1 %
Lymphocytes Relative: 14 %
Lymphs Abs: 1.6 10*3/uL (ref 0.7–4.0)
MCH: 23.1 pg — ABNORMAL LOW (ref 26.0–34.0)
MCHC: 30.5 g/dL (ref 30.0–36.0)
MCV: 75.9 fL — ABNORMAL LOW (ref 80.0–100.0)
Monocytes Absolute: 0.7 10*3/uL (ref 0.1–1.0)
Monocytes Relative: 6 %
Neutro Abs: 9.2 10*3/uL — ABNORMAL HIGH (ref 1.7–7.7)
Neutrophils Relative %: 77 %
Platelets: 95 10*3/uL — ABNORMAL LOW (ref 150–400)
RBC: 4.15 MIL/uL (ref 3.87–5.11)
RDW: 19.3 % — ABNORMAL HIGH (ref 11.5–15.5)
WBC: 11.9 10*3/uL — ABNORMAL HIGH (ref 4.0–10.5)
nRBC: 0 % (ref 0.0–0.2)

## 2020-05-21 LAB — RENAL FUNCTION PANEL
Albumin: 2.3 g/dL — ABNORMAL LOW (ref 3.5–5.0)
Anion gap: 11 (ref 5–15)
BUN: 21 mg/dL (ref 8–23)
CO2: 33 mmol/L — ABNORMAL HIGH (ref 22–32)
Calcium: 8.1 mg/dL — ABNORMAL LOW (ref 8.9–10.3)
Chloride: 93 mmol/L — ABNORMAL LOW (ref 98–111)
Creatinine, Ser: 1.51 mg/dL — ABNORMAL HIGH (ref 0.44–1.00)
GFR, Estimated: 34 mL/min — ABNORMAL LOW (ref >60)
Glucose, Bld: 105 mg/dL — ABNORMAL HIGH (ref 70–99)
Phosphorus: 3.3 mg/dL (ref 2.5–4.6)
Potassium: 3.1 mmol/L — ABNORMAL LOW (ref 3.5–5.1)
Sodium: 137 mmol/L (ref 135–145)

## 2020-05-21 LAB — MAGNESIUM: Magnesium: 2.4 mg/dL (ref 1.7–2.4)

## 2020-05-21 MED ORDER — CEFAZOLIN SODIUM-DEXTROSE 2-4 GM/100ML-% IV SOLN: 2.0000 g | Freq: Three times a day (TID) | INTRAVENOUS | Status: DC

## 2020-05-21 MED ORDER — CEFAZOLIN SODIUM-DEXTROSE 2-4 GM/100ML-% IV SOLN
2.0000 g | Freq: Two times a day (BID) | INTRAVENOUS | Status: DC
  Administered 2020-05-21 – 2020-05-22 (×2): 2 g via INTRAVENOUS
  Filled 2020-05-21 (×2): qty 100

## 2020-05-21 MED ORDER — BUDESONIDE 3 MG PO CPEP
9.0000 mg | ORAL_CAPSULE | Freq: Every day | ORAL | Status: DC
  Administered 2020-05-22 – 2020-05-24 (×3): 9 mg via ORAL
  Filled 2020-05-21 (×3): qty 3

## 2020-05-21 MED ORDER — POTASSIUM CHLORIDE CRYS ER 20 MEQ PO TBCR
40.0000 meq | EXTENDED_RELEASE_TABLET | ORAL | Status: AC
  Administered 2020-05-21 (×2): 40 meq via ORAL
  Filled 2020-05-21 (×2): qty 2

## 2020-05-21 MED ORDER — NYSTATIN 100000 UNIT/ML MT SUSP
5.0000 mL | Freq: Four times a day (QID) | OROMUCOSAL | Status: DC
  Administered 2020-05-21 – 2020-05-24 (×12): 500000 [IU] via ORAL
  Filled 2020-05-21 (×12): qty 5

## 2020-05-21 NOTE — Progress Notes (Signed)
PROGRESS NOTE    [name]  ZGY:[id number] DOB: Aug 03, 1946 DOA: 05/17/2020 PCP: [name], MD   Chief Complaint  Patient presents with  . Weakness    Brief Narrative:  Rhonda Bailey a 74 y.o.femalewith medical history ofinflammatory bowel disease initially diagnosed via colonoscopy on 12/04/2019. The patient also has history of hypothyroidism, anxiety, and iron deficiency anemia. The patient has been receiving Humira under the guidance of Dr. [name].In addition, the patient has been on Lialda and azathioprine as well as a prednisone taper for her inflammatory bowel disease. She is down to prednisone 5 mg daily with which she is due to finish in 3 days. She presents with 1 week history of nausea, vomiting, and diarrhea. The patient has been having 8 loose stools on a daily basis without any hematochezia. She has having some black stools, but she is taking iron. Because of her vomiting, the patient states that she has not been able to keep down her medications for about 48 hours prior to this admission. She has had increasing generalized weakness and difficulty getting out of bed today.   In the emergency department, the patient was afebrile with hypotension initially 81/68. She was started on IV fluids with some improvement of her blood pressure. BMP showed a serum creatinine of 4.33 which is above her usual baseline. LFTs were unremarkable. WBC 36.3, hemoglobin 10.8, platelets 215,000. Hospitalist was contacted for admission for further evaluation and treatment. She has been fully vaccinated for COVID with Moderna.  Worked up revealed left ureteral stone. She was transferred to [facility] and underwent stent placement by urology, Dr. [name].  GI was also consulted.    Assessment & Plan:   Principal Problem:   Sepsis ([place]) Active Problems:   Hypoxia   Bacterial infection due to Proteus mirabilis   AKI (acute kidney injury) ([place])    Diarrhea   IBD (inflammatory bowel disease)   Immunosuppression (HCC)   Bacteremia   Abnormal CT scan, gastrointestinal tract   Acute renal failure (HCC)   Anxiety   Sepsis due to gram-negative urinary tract infection (HCC)   Hypervolemia  1 severe sepsis secondary to obstructive urethral stone and Proteus bacteremia, POA Patient on admission met criteria for sepsis with tachycardia, significant leukocytosis, hypotension which was responsive to IV fluids and IV antibiotics, acute renal failure.  CT abdomen and pelvis done concerning for obstructive left ureteral stone with hydroureteronephrosis and wall thickening involving much of the colon concerning for infectious versus inflammatory colitis.  Patient was seen in consultation by urology and patient status post left ureteral stent placement 05/17/2020 per Dr. [name].  Blood cultures positive for Proteus Mirabilis which is pansensitive.  Patient with improvement with leukocytosis.  Patient currently afebrile.  Patient noted to have a low-grade temp on 05/20/2020, felt terrible with ongoing bilateral CVA tenderness and as such repeat CT abdomen and pelvis was obtained which showed new stranding and subcutaneous fat of the left abdomen due to dependent change, trace bilateral pleural effusions and dependent atelectasis have mildly increased since prior CT, resolved left hydronephrosis with good stent placement, 3-4 punctate left renal stone seen again, improvement of inflammatory change about descending colon, fatty infiltration of the liver.  Patient has been reassessed by urology and recommending continue current regimen of antibiotics and no further intervention needed at this time.  Urology recommending outpatient follow-up in 2 to 3 weeks to have definitive stone removal surgery scheduled.  Narrow IV antibiotics to IV Ancef and if continued  improvement could likely transition over the next 1 to 2 days to oral antibiotics and treat for total of 2 weeks  with day #1 been when stent was placed.  Urology following.  Supportive care.  2.  Hypoxia secondary to volume overload Patient noted to be hypoxic the evening of 05/19/2020, with some lethargy per RN, patient currently with sats of 93% on 5 L nasal cannula.  Patient noted to be volume overloaded on examination secondary to aggressive fluid resuscitation on presentation.  Patient placed on IV Lasix with a urine output of 3.760 L over the past 24 hours.  Patient is -1.5 L during this hospitalization.  Chest x-ray which was done was concerning for congestion.  IV fluids have been saline lock.  Strict I's and O's.  Daily weights.  Wean O2.  Discontinue IV Lasix.  3.  Inflammatory bowel disease CT abdomen and pelvis revealed colonic wall thickening as well as diverticulosis.  Patient presented with nausea vomiting and diarrhea.  Nausea vomiting improved however still with ongoing diarrhea.  Patient noted to be on Humira, azathioprine, Lialda, steroid taper in the outpatient setting.  C. difficile PCR which was done was negative.  Fecal calprotectin elevated at 725.  GI consulted and are following and due to elevated calprotectin, concern for inflammatory component to her diarrhea in the setting of IBD.  Mesalamine has been increased to 4.8 g daily per GI recommendations.  GI holding off on steroid treatment or resumption of Humira at this time due to active infection.  Continue azathioprine.  Humira level and antibody test pending.  Diet advanced to a soft diet.  Per GI.   4.  Acute renal failure/metabolic acidosis Secondary to problem #1/post renal azotemia secondary to obstructing ureteral stone.  Status post urethral stent placement.  Urine output of 3.760 over the past 24 hours.  Renal function improving.  Concern for volume overload secondary to fluid resuscitation and as such IV fluids were saline locked and patient placed on diuretics.  Strict I's and O's.  Daily weights.  Supportive care.   5.   Hypothyroidism Continue Synthroid.   6.  Anxiety Continue trazodone, alprazolam.  7.  Anemia of chronic disease/iron deficiency anemia Likely dilutional in nature.  Patient with no overt bleeding.  Hemoglobin currently stable at 9.6.  Anemia panel consistent with iron deficiency anemia/anemia of chronic disease.  Transfusion threshold hemoglobin  < 7.  8.  Hypokalemia/hypomagnesemia Likely secondary to GI losses and diuretics.  Potassium at 3.1.  Magnesium at 2.4.  K. Dur 40 mEq p.o. every 4 hours x2 doses..  Magnesium sulfate 4 g IV x1.  K. Dur 40 mEq p.o. x1.   DVT prophylaxis: Heparin Code Status: Full Family Communication: Updated patient.  Updated daughter, Clarene Critchley at bedside. Disposition:   Status is: Inpatient    Dispo: The patient is from: Home              Anticipated d/c is to: Home              Anticipated d/c date is: 4-5 days.              Patient currently patient with bacteremia likely seeded from the urine, status post stent placement for urethral stone, on IV antibiotics, bilateral CVA tenderness, diarrhea.  Not stable for discharge.        Consultants:   Gastroenterology: Dr. Hilarie Fredrickson 05/18/2020  Urology: Dr. [name] 05/17/2020    Procedures:   CT abdomen and pelvis 05/17/2020, 05/20/2020  Chest x-ray 05/17/2020  Cystoscopy/left urethral stent placement/left retrograde pyelography with interpretation per Dr. [name], urology 05/17/2020  Antimicrobials:  IV Rocephin 05/17/2020>>> 05/21/2020  IV Ancef 05/21/2020   Subjective: Patient sitting up in chair.  Feels better than she did yesterday.  Eating a diet.  Denies any chest pain.  States left CVA tenderness has improved.  Complaining of some right flank pain.  Some improvement with O2 requirements.  Currently afebrile.  Good urine output over the past 24 hours with diuresis.  Patient still endorses some loose stools, however per nurse tech stools more soft/mushy.  Objective: Vitals:   05/20/20  2204 05/21/20 0222 05/21/20 0621 05/21/20 1004  BP: 136/62 (!) 99/55 131/78 113/65  Pulse: (!) 109 89 100 (!) 105  Resp: _0 Temp: 99.3 F (37.4 C) 98.4 F (36.9 C) 98 F (36.7 C) 98.9 F (37.2 C)  TempSrc: Oral Oral Oral   SpO2: 96% 98% 96% 98%  Weight:      Height:        Intake/Output Summary (Last 24 hours) at 05/21/2020 1031 Last data filed at 05/21/2020 0949 Gross per 24 hour  Intake 680 ml  Output 2510 ml  Net -1830 ml   Filed Weights   05/17/20 0936  Weight: 73.9 kg    Examination:  General exam: NAD. Respiratory system: Bibasilar crackles, no wheezing, fair air movement, speaking in full sentences.  Cardiovascular system: Regular rate rhythm no murmurs rubs or gallops.  No JVD.  No lower extremity edema.  Gastrointestinal system: Right CVA tenderness.  Abdomen nondistended, soft, positive bowel sounds.  No rebound.  No guarding.  Central nervous system: Alert and oriented.  No focal neurological deficits.  Moving extremities spontaneously.   Extremities: Symmetric 5 x 5 power. Skin: No rashes, lesions or ulcers Psychiatry: Judgement and insight appear normal. Mood & affect appropriate.     Data Reviewed: I have personally reviewed following labs and imaging studies  CBC: Recent Labs  Lab 05/17/20 1015 05/18/20 0746 05/19/20 0455 05/20/20 0638 05/21/20 0442  WBC 36.3* 20.8* 21.1* 11.7* 11.9*  NEUTROABS  --   --   --  9.8* 9.2*  HGB 10.8* 9.8* 9.9* 10.6* 9.6*  HCT 36.1 33.1* 33.0* 34.2* 31.5*  MCV 75.8* 78.1* 76.0* 74.8* 75.9*  PLT 215 153 132* 99* 95*    Basic Metabolic Panel: Recent Labs  Lab 05/17/20 1015 05/18/20 0930 05/19/20 0455 05/20/20 0638 05/21/20 0442  NA 136 140 143 143 137  K 3.9 4.7 4.2 3.4* 3.1*  CL 103 111 115* 103 93*  CO2 20* 17* 16* 24 33*  GLUCOSE 116* 147* 143* 94 105*  BUN 45* 42* 32* 18 21  CREATININE 4.33* 2.55* 1.61* 1.34* 1.51*  CALCIUM 7.9* 8.2* 8.7* 8.3* 8.1*  MG  --   --   --  1.5* 2.4  PHOS  --    --   --  2.7 3.3    GFR: Estimated Creatinine Clearance: 32.9 mL/min (A) (by C-G formula based on SCr of 1.51 mg/dL (H)).  Liver Function Tests: Recent Labs  Lab 05/17/20 1015 05/20/20 0638 05/21/20 0442  AST 32  --   --   ALT 32  --   --   ALKPHOS 130*  --   --   BILITOT 0.8  --   --   PROT 5.9*  --   --   ALBUMIN 2.8* 2.5* 2.3*    CBG: No results for input(s): GLUCAP in the last 168  hours.   Recent Results (from the past 240 hour(s))  Respiratory Panel by RT PCR (Flu A&B, Covid) - Nasopharyngeal Swab     Status: None   Collection Time: 05/17/20 11:21 AM   Specimen: Nasopharyngeal Swab  Result Value Ref Range Status   SARS Coronavirus 2 by RT PCR NEGATIVE NEGATIVE Final    Comment: (NOTE) SARS-CoV-2 target nucleic acids are NOT DETECTED.  The SARS-CoV-2 RNA is generally detectable in upper respiratoy specimens during the acute phase of infection. The lowest concentration of SARS-CoV-2 viral copies this assay can detect is 131 copies/mL. A negative result does not preclude SARS-Cov-2 infection and should not be used as the sole basis for treatment or other patient management decisions. A negative result may occur with  improper specimen collection/handling, submission of specimen other than nasopharyngeal swab, presence of viral mutation(s) within the areas targeted by this assay, and inadequate number of viral copies (<131 copies/mL). A negative result must be combined with clinical observations, patient history, and epidemiological information. The expected result is Negative.  Fact Sheet for Patients:  PinkCheek.be  Fact Sheet for Healthcare Providers:  GravelBags.it  This test is no t yet approved or cleared by the [place] FDA and  has been authorized for detection and/or diagnosis of SARS-CoV-2 by FDA under an Emergency Use Authorization (EUA). This EUA will remain  in effect (meaning this test  can be used) for the duration of the COVID-19 declaration under Section 564(b)(1) of the Act, 21 U.S.C. section 360bbb-3(b)(1), unless the authorization is terminated or revoked sooner.     Influenza A by PCR NEGATIVE NEGATIVE Final   Influenza B by PCR NEGATIVE NEGATIVE Final    Comment: (NOTE) The Xpert Xpress SARS-CoV-2/FLU/RSV assay is intended as an aid in  the diagnosis of influenza from Nasopharyngeal swab specimens and  should not be used as a sole basis for treatment. Nasal washings and  aspirates are unacceptable for Xpert Xpress SARS-CoV-2/FLU/RSV  testing.  Fact Sheet for Patients: PinkCheek.be  Fact Sheet for Healthcare Providers: GravelBags.it  This test is not yet approved or cleared by the [place] FDA and  has been authorized for detection and/or diagnosis of SARS-CoV-2 by  FDA under an Emergency Use Authorization (EUA). This EUA will remain  in effect (meaning this test can be used) for the duration of the  Covid-19 declaration under Section 564(b)(1) of the Act, 21  U.S.C. section 360bbb-3(b)(1), unless the authorization is  terminated or revoked. Performed at [facility], [address], [place], [place] [address]   Culture, blood (Routine X 2) w Reflex to ID Panel     Status: Abnormal   Collection Time: 05/17/20 12:48 PM   Specimen: BLOOD  Result Value Ref Range Status   Specimen Description   Final    BLOOD RIGHT ANTECUBITAL Performed at [facility], [address] [place], [place] [address]    Special Requests   Final    BOTTLES DRAWN AEROBIC AND ANAEROBIC Blood Culture results may not be optimal due to an inadequate volume of blood received in culture bottles Performed at [facility], [address] [place], [place] [address]    Culture  Setup Time   Final    IN BOTH AEROBIC AND ANAEROBIC BOTTLES GRAM NEGATIVE RODS PATIENT DISCHARGED OR EXPIRED Gram Stain Report Called to,Read Back By  and Verified With: ONAEGBUAM,I RN @ [facility] _0  05/18/2020 BY MATTHEWS, B     Culture PROTEUS MIRABILIS (A)  Final   Report Status 05/20/2020  FINAL  Final   Organism ID, Bacteria PROTEUS MIRABILIS  Final      Susceptibility   Proteus mirabilis - MIC*    AMPICILLIN <=2 SENSITIVE Sensitive     CEFAZOLIN <=4 SENSITIVE Sensitive     CEFEPIME <=0.12 SENSITIVE Sensitive     CEFTAZIDIME <=1 SENSITIVE Sensitive     CEFTRIAXONE <=0.25 SENSITIVE Sensitive     CIPROFLOXACIN <=0.25 SENSITIVE Sensitive     GENTAMICIN <=1 SENSITIVE Sensitive     IMIPENEM 2 SENSITIVE Sensitive     TRIMETH/SULFA <=20 SENSITIVE Sensitive     AMPICILLIN/SULBACTAM <=2 SENSITIVE Sensitive     PIP/TAZO <=4 SENSITIVE Sensitive     * PROTEUS MIRABILIS  Blood Culture ID Panel (Reflexed)     Status: Abnormal   Collection Time: 05/17/20 12:48 PM  Result Value Ref Range Status   Enterococcus faecalis NOT DETECTED NOT DETECTED Final   Enterococcus Faecium NOT DETECTED NOT DETECTED Final   Listeria monocytogenes NOT DETECTED NOT DETECTED Final   Staphylococcus species NOT DETECTED NOT DETECTED Final   Staphylococcus aureus (BCID) NOT DETECTED NOT DETECTED Final   Staphylococcus epidermidis NOT DETECTED NOT DETECTED Final   Staphylococcus lugdunensis NOT DETECTED NOT DETECTED Final   Streptococcus species NOT DETECTED NOT DETECTED Final   Streptococcus agalactiae NOT DETECTED NOT DETECTED Final   Streptococcus pneumoniae NOT DETECTED NOT DETECTED Final   Streptococcus pyogenes NOT DETECTED NOT DETECTED Final   A.calcoaceticus-baumannii NOT DETECTED NOT DETECTED Final   Bacteroides fragilis NOT DETECTED NOT DETECTED Final   Enterobacterales DETECTED (A) NOT DETECTED Final    Comment: Enterobacterales represent a large order of gram negative bacteria, not a single organism. CRITICAL RESULT CALLED TO, READ BACK BY AND VERIFIED WITH: Guadlupe Spanish PharmD 10:30 05/18/20 ([email or web address])    Enterobacter cloacae complex NOT DETECTED NOT  DETECTED Final   Escherichia coli NOT DETECTED NOT DETECTED Final   Klebsiella aerogenes NOT DETECTED NOT DETECTED Final   Klebsiella oxytoca NOT DETECTED NOT DETECTED Final   Klebsiella pneumoniae NOT DETECTED NOT DETECTED Final   Proteus species DETECTED (A) NOT DETECTED Final    Comment: CRITICAL RESULT CALLED TO, READ BACK BY AND VERIFIED WITH: Guadlupe Spanish PharmD 10:30 05/18/20 ([email or web address])    Salmonella species NOT DETECTED NOT DETECTED Final   Serratia marcescens NOT DETECTED NOT DETECTED Final   Haemophilus influenzae NOT DETECTED NOT DETECTED Final   Neisseria meningitidis NOT DETECTED NOT DETECTED Final   Pseudomonas aeruginosa NOT DETECTED NOT DETECTED Final   Stenotrophomonas maltophilia NOT DETECTED NOT DETECTED Final   Candida albicans NOT DETECTED NOT DETECTED Final   Candida auris NOT DETECTED NOT DETECTED Final   Candida glabrata NOT DETECTED NOT DETECTED Final   Candida krusei NOT DETECTED NOT DETECTED Final   Candida parapsilosis NOT DETECTED NOT DETECTED Final   Candida tropicalis NOT DETECTED NOT DETECTED Final   Cryptococcus neoformans/gattii NOT DETECTED NOT DETECTED Final   CTX-M ESBL NOT DETECTED NOT DETECTED Final   Carbapenem resistance IMP NOT DETECTED NOT DETECTED Final   Carbapenem resistance KPC NOT DETECTED NOT DETECTED Final   Carbapenem resistance NDM NOT DETECTED NOT DETECTED Final   Carbapenem resist OXA 48 LIKE NOT DETECTED NOT DETECTED Final   Carbapenem resistance VIM NOT DETECTED NOT DETECTED Final    Comment: Performed at [facility], [address], [place], [place] [address]  Culture, blood (Routine X 2) w Reflex to ID Panel     Status: Abnormal   Collection Time: 05/17/20  1:46 PM  Specimen: BLOOD  Result Value Ref Range Status   Specimen Description   Final    BLOOD Performed at [facility], [address] [place], [place] [address]    Special Requests   Final    NONE Performed at [facility], [address] [place],  [place] [address]    Culture  Setup Time   Final    AEROBIC BOTTLE ONLY GRAM NEGATIVE RODS PATIENT DISCHARGED OR EXPIRED ANAEROBIC BOTTLE GRAM NEGATIVE RODS [facility] RN AT [facility]_0  BY Santiago Glad 10.12.2021   Performed at [facility], [address], [place], [place] [address]    Culture (A)  Final    PROTEUS MIRABILIS SUSCEPTIBILITIES PERFORMED ON PREVIOUS CULTURE WITHIN THE LAST 5 DAYS. Performed at [facility], [place] [address], [place], [place] [address]    Report Status 05/20/2020 FINAL  Final  C Difficile Quick Screen w PCR reflex     Status: None   Collection Time: 05/18/20  4:18 AM  Result Value Ref Range Status   C Diff antigen NEGATIVE NEGATIVE Final   C Diff toxin NEGATIVE NEGATIVE Final   C Diff interpretation No C. difficile detected.  Final    Comment: Performed at [facility], [place] [address] [place], [place] [address]  Gastrointestinal Panel by PCR , Stool     Status: None   Collection Time: 05/18/20  4:18 AM  Result Value Ref Range Status   Campylobacter species NOT DETECTED NOT DETECTED Final   Plesimonas shigelloides NOT DETECTED NOT DETECTED Final   Salmonella species NOT DETECTED NOT DETECTED Final   Yersinia enterocolitica NOT DETECTED NOT DETECTED Final   Vibrio species NOT DETECTED NOT DETECTED Final   Vibrio cholerae NOT DETECTED NOT DETECTED Final   Enteroaggregative E coli (EAEC) NOT DETECTED NOT DETECTED Final   Enteropathogenic E coli (EPEC) NOT DETECTED NOT DETECTED Final   Enterotoxigenic E coli (ETEC) NOT DETECTED NOT DETECTED Final   Shiga like toxin producing E coli (STEC) NOT DETECTED NOT DETECTED Final   Shigella/Enteroinvasive E coli (EIEC) NOT DETECTED NOT DETECTED Final   Cryptosporidium NOT DETECTED NOT DETECTED Final   Cyclospora cayetanensis NOT DETECTED NOT DETECTED Final   Entamoeba histolytica NOT DETECTED NOT DETECTED Final   Giardia lamblia NOT DETECTED NOT DETECTED Final   Adenovirus F40/41 NOT DETECTED NOT  DETECTED Final   Astrovirus NOT DETECTED NOT DETECTED Final   Norovirus GI/GII NOT DETECTED NOT DETECTED Final   Rotavirus A NOT DETECTED NOT DETECTED Final   Sapovirus (I, II, IV, and V) NOT DETECTED NOT DETECTED Final    Comment: Performed at [facility], [address] [place], [place] [address]  Urine Culture     Status: None   Collection Time: 05/18/20  4:18 AM   Specimen: Urine, Clean Catch  Result Value Ref Range Status   Specimen Description   Final    URINE, CLEAN CATCH Performed at [facility], [place] [address], [place], [place] [address]    Special Requests   Final    NONE Performed at [facility], [place] [address] [place], [place] [address]    Culture   Final    NO GROWTH Performed at [facility], [place] [address], [place], [place] [address]    Report Status 05/19/2020 FINAL  Final  Calprotectin, Fecal     Status: Abnormal   Collection Time: 05/18/20  1:06 PM   Specimen: Stool  Result Value Ref Range Status  Calprotectin, Fecal 725 (H) 0 - 120 ug/g Final    Comment: (NOTE) Concentration     Interpretation   Follow-Up <16 - 50 ug/g     Normal           None >50 -120 ug/g     Borderline       Re-evaluate in 4-6 weeks    >120 ug/g     Abnormal         Repeat as clinically                                   indicated Performed At: [facility] [place], [place] [id number] Rush Farmer MD HB:[id number]          Radiology Studies: CT ABDOMEN PELVIS WO CONTRAST  Result Date: 05/20/2020 CLINICAL DATA:  Patient diagnosed with inflammatory bowel disease by colonoscopy 12/04/2019. The patient was also diagnosed with a left ureteral stone 05/17/2020. Nausea, vomiting and diarrhea for a little over 1 week. EXAM: CT ABDOMEN AND PELVIS WITHOUT CONTRAST TECHNIQUE: Multidetector CT imaging of the abdomen and pelvis was performed following the standard protocol without IV contrast. COMPARISON:  CT  abdomen and pelvis 05/17/2020. FINDINGS: Lower chest: Trace bilateral pleural effusions and mild dependent atelectasis, slightly increased since the prior CT. Hepatobiliary: Status post cholecystectomy. A few small calcifications in the liver are again seen. The liver is low attenuating consistent with fatty infiltration. Biliary tree is negative. Pancreas: Fatty replacement again seen.  Otherwise negative. Spleen: Normal in size without focal abnormality. Adrenals/Urinary Tract: Since the prior examination, the patient has undergone placement of a left ureteral stent which is in good position. Previously seen left ureteral stone is no longer identified and hydronephrosis has resolved. 3-4 punctate left renal stones are seen. There are no right renal stones or hydronephrosis. Atrophy of the right kidney relative to the left noted. Right ureter appears normal. The urinary bladder is decompressed with a Foley catheter in place. Likely left adrenal adenoma noted. The right adrenal gland is unremarkable Stomach/Bowel: Inflammatory change of the descending colon persists but appear mildly improved. Scattered diverticular disease is noted. No pneumatosis or portal venous gas. Small hiatal hernia again seen. The stomach and small bowel are unremarkable. The appendix is normal in appearance. Vascular/Lymphatic: Aortic atherosclerosis. No enlarged abdominal or pelvic lymph nodes. Reproductive: Uterus and bilateral adnexa are unremarkable. Other: Tiny fat containing umbilical hernia noted. There is new stranding in subcutaneous fat along the left side of the abdomen. Musculoskeletal: No acute abnormality. L1 and L2 compression fractures seen on the prior study noted. Advanced bilateral hip osteoarthritis is also present. IMPRESSION: There is new stranding in subcutaneous fat of the left abdomen which may be due to dependent change but is nonspecific. Trace bilateral pleural effusions and dependent atelectasis have mildly  increased since the prior CT. Resolved left hydronephrosis with a new double-J ureteral stone in good position. 3-4 punctate left renal stones are again seen. Some improvement of inflammatory change about the descending colon. Fatty infiltration of the liver. Aortic Atherosclerosis (ICD10-I70.0). Electronically Signed   By: Inge Rise M.D.   On: 05/20/2020 15:11   DG CHEST PORT 1 VIEW  Result Date: 05/19/2020 CLINICAL DATA:  Hypoxia EXAM: PORTABLE CHEST 1 VIEW COMPARISON:  05/17/2020 FINDINGS: Cardiac shadow is stable. Aortic calcifications are seen. Mild vascular congestion is seen without edema. No focal infiltrate is seen. No pneumothorax is noted. IMPRESSION:  Mild central vascular congestion without interstitial edema. Electronically Signed   By: Inez Catalina M.D.   On: 05/19/2020 22:23        Scheduled Meds: . azaTHIOprine  50 mg Oral Daily  . Chlorhexidine Gluconate Cloth  6 each Topical Daily  . furosemide  20 mg Intravenous Q12H  . heparin  5,000 Units Subcutaneous Q8H  . levothyroxine  37.5 mcg Oral QAC breakfast  . mesalamine  4.8 g Oral Q breakfast  . ondansetron (ZOFRAN) IV  4 mg Intravenous Q6H  . pantoprazole (PROTONIX) IV  40 mg Intravenous Q12H  . potassium chloride  40 mEq Oral Q4H   Continuous Infusions: .  ceFAZolin (ANCEF) IV       LOS: 4 days    Time spent: 40 minutes    Irine Seal, MD [facility]   To contact the attending provider between 7A-7P or the covering provider during after hours 7P-7A, please log into the web site [email or web address] and access using universal [place] password for that web site. If you do not have the password, please call the hospital operator.  05/21/2020, 10:31 AM

## 2020-05-21 NOTE — Progress Notes (Addendum)
PHARMACY NOTE:  ANTIMICROBIAL RENAL DOSAGE ADJUSTMENT  Current antimicrobial regimen includes a mismatch between antimicrobial dosage and estimated renal function.  As per policy approved by the Pharmacy & Therapeutics and Medical Executive Committees, the antimicrobial dosage will be adjusted accordingly.  Current antimicrobial dosage: Cefazolin 2g IV q8h  Indication: severe sepsis secondary to obstructive urethral stone and Proteus bacteremia  Renal Function:  Estimated Creatinine Clearance: 32.9 mL/min (A) (by C-G formula based on SCr of 1.51 mg/dL (H)). []      On intermittent HD, scheduled: []      On CRRT    Antimicrobial dosage has been changed to: Cefazolin 2g IV q12h  Thank you for allowing pharmacy to be a part of this patient's care.  Luiz Ochoa, [facility] 05/21/2020 8:50 AM

## 2020-05-21 NOTE — Evaluation (Signed)
Physical Therapy Evaluation Patient Details Name: Rhonda Bailey MRN: [id number] DOB: 24-May-1946 Today's Date: 05/21/2020   History of Present Illness  74 y.o. female with medical history of inflammatory bowel disease initially diagnosed via colonoscopy on 12/04/2019.  The patient also has history of hypothyroidism, anxiety, and iron deficiency anemia.  admitted with nausea, vomiting, and diarrhea. went into respiratory distress d/t fluid overload requiring 5L O2, down to 2L at time of PT eval  Clinical Impression  Pt admitted with above diagnosis.  Mobility limited today d/t pt feeling sleepy and tired. She was agreeable to sit  OOB. Pt states she is agreeable to amb next day or as PT schedule allows   Pt currently with functional limitations due to the deficits listed below (see PT Problem List). Pt will benefit from skilled PT to increase their independence and safety with mobility to allow discharge to the venue listed below.       Follow Up Recommendations No PT follow up    Equipment Recommendations  None recommended by PT    Recommendations for Other Services       Precautions / Restrictions Precautions Precautions: Fall Restrictions Weight Bearing Restrictions: No      Mobility  Bed Mobility Overal bed mobility: Needs Assistance Bed Mobility: Supine to Sit;Sit to Supine     Supine to sit: Supervision Sit to supine: Supervision   General bed mobility comments: for safety  Transfers                 General transfer comment: pt sat EOB to eat some of her lunch which she slept through, did not feel up to walking at this time; SpO2=87% on RA (pt ook of her own O2), O2 replaced at 2L  Ambulation/Gait                Stairs            Wheelchair Mobility    Modified Rankin (Stroke Patients Only)       Balance Overall balance assessment: Needs assistance Sitting-balance support: No upper extremity supported;Feet supported Sitting  balance-Leahy Scale: Good                                       Pertinent Vitals/Pain Pain Assessment: Faces Faces Pain Scale: Hurts little more Pain Location: back Pain Descriptors / Indicators: Sore Pain Intervention(s): Limited activity within patient's tolerance;Monitored during session;Repositioned    Home Living Family/patient expects to be discharged to:: Private residence Living Arrangements: Alone Available Help at Discharge: Family Type of Home: House Home Access: Stairs to enter   Technical brewer of Steps: 2 Home Layout: One level Home Equipment: Environmental consultant - 2 wheels;Cane - single point      Prior Function Level of Independence: Independent;Independent with assistive device(s)         Comments: amb with RW for the last wk  prior to admission. pt works at [employer] on [address]        Extremity/Trunk Assessment   Upper Extremity Assessment Upper Extremity Assessment: Defer to OT evaluation    Lower Extremity Assessment Lower Extremity Assessment: Overall WFL for tasks assessed       Communication   Communication: No difficulties  Cognition Arousal/Alertness: Awake/alert Behavior During Therapy: WFL for tasks assessed/performed Overall Cognitive Status: Within Functional Limits for tasks assessed  General Comments      Exercises     Assessment/Plan    PT Assessment    PT Problem List         PT Treatment Interventions      PT Goals (Current goals can be found in the Care Plan section)  Acute Rehab PT Goals Patient Stated Goal: home soon, feel better PT Goal Formulation: With patient Time For Goal Achievement: 06/04/20 Potential to Achieve Goals: Fair    Frequency     Barriers to discharge        Co-evaluation               AM-PAC PT "6 Clicks" Mobility  Outcome Measure Help needed turning from your back to your side  while in a flat bed without using bedrails?: A Little Help needed moving from lying on your back to sitting on the side of a flat bed without using bedrails?: A Little Help needed moving to and from a bed to a chair (including a wheelchair)?: A Little Help needed standing up from a chair using your arms (e.g., wheelchair or bedside chair)?: A Little Help needed to walk in hospital room?: A Little Help needed climbing 3-5 steps with a railing? : A Little 6 Click Score: 18    End of Session   Activity Tolerance: Patient tolerated treatment well Patient left: in bed;with call bell/phone within reach;with bed alarm set   PT Visit Diagnosis: Difficulty in walking, not elsewhere classified (R26.2)    Time: 4097-3532 PT Time Calculation (min) (ACUTE ONLY): 20 min   Charges:   PT Evaluation $PT Eval Low Complexity: [place], PT  Acute Rehab Dept ([facility]) [phone] Pager [phone]  05/21/2020   [facility] 05/21/2020, 4:20 PM

## 2020-05-21 NOTE — Progress Notes (Addendum)
Patient ID: Rhonda Bailey, female   DOB: [birth year], 74 y.o.   MRN: [id number]    Progress Note   Subjective   Day #4  CC; urinary sepsis/ IBD  WBC 11.9, hgb 9.6  CReat 1.5 stable but not normalized K 3.1, alb 2.3  Adalimumab AB -pend  IV Ancef  CT abdomen pelvis without contrast yesterday showed new stranding in the subcutaneous fat of the left abdomen nonspecific trace bilateral pleural effusions, resolved left hydronephrosis with new double-J ureteral stent in good position 3-4 punctate left renal stones seen.  The previously noted left ureteral stone not visualized.  Some improvement of inflammatory change about the descending colon, fatty infiltration of the liver  Patient still feels bad, she does seem a bit brighter today and says she slept well with Xanax and would like to continue that at bedtime.  She has not been eating much, complains of dryness and discomfort in her mouth.  She is having less back pain, does have some left lower quadrant pain today. No diarrhea overnight and says she has been having a bit less diarrhea and just small amounts.    Objective   Vital signs in last 24 hours: Temp:  [98 F (36.7 C)-99.3 F (37.4 C)] 98 F (36.7 C) (10/15 0621) Pulse Rate:  [89-116] 100 (10/15 0621) Resp:  [18-24] 20 (10/15 0621) BP: (99-143)/(55-84) 131/78 (10/15 0621) SpO2:  [85 %-98 %] 96 % (10/15 0621) Last BM Date: 05/20/20 General:   older white female in NAD, ill appearing, less flushed Heart:  Regular rate and rhythm; no murmurs Lungs: Respirations even and unlabored, lungs CTA bilaterally Abdomen:  Soft,  Nondistended, mild tenderness left lower abd. Normal bowel sounds. Extremities:  Without edema. Neurologic:  Alert and oriented,  grossly normal neurologically. Psych:  Cooperative. Normal mood and affect.  Intake/Output from previous day: 10/14 0701 - 10/15 0700 In: 680 [P.O.:480; IV Piggyback:200] Out: 3760 [Urine:3760] Intake/Output this shift: No  intake/output data recorded.  Lab Results: Recent Labs    05/19/20 0455 05/20/20 0638 05/21/20 0442  WBC 21.1* 11.7* 11.9*  HGB 9.9* 10.6* 9.6*  HCT 33.0* 34.2* 31.5*  PLT 132* 99* 95*   BMET Recent Labs    05/19/20 0455 05/20/20 0638 05/21/20 0442  NA 143 143 137  K 4.2 3.4* 3.1*  CL 115* 103 93*  CO2 16* 24 33*  GLUCOSE 143* 94 105*  BUN 32* 18 21  CREATININE 1.61* 1.34* 1.51*  CALCIUM 8.7* 8.3* 8.1*   LFT Recent Labs    05/21/20 0442  ALBUMIN 2.3*   PT/INR No results for input(s): LABPROT, INR in the last 72 hours.  Studies/Results: CT ABDOMEN PELVIS WO CONTRAST  Result Date: 05/20/2020 CLINICAL DATA:  Patient diagnosed with inflammatory bowel disease by colonoscopy 12/04/2019. The patient was also diagnosed with a left ureteral stone 05/17/2020. Nausea, vomiting and diarrhea for a little over 1 week. EXAM: CT ABDOMEN AND PELVIS WITHOUT CONTRAST TECHNIQUE: Multidetector CT imaging of the abdomen and pelvis was performed following the standard protocol without IV contrast. COMPARISON:  CT abdomen and pelvis 05/17/2020. FINDINGS: Lower chest: Trace bilateral pleural effusions and mild dependent atelectasis, slightly increased since the prior CT. Hepatobiliary: Status post cholecystectomy. A few small calcifications in the liver are again seen. The liver is low attenuating consistent with fatty infiltration. Biliary tree is negative. Pancreas: Fatty replacement again seen.  Otherwise negative. Spleen: Normal in size without focal abnormality. Adrenals/Urinary Tract: Since the prior examination, the patient has undergone placement  of a left ureteral stent which is in good position. Previously seen left ureteral stone is no longer identified and hydronephrosis has resolved. 3-4 punctate left renal stones are seen. There are no right renal stones or hydronephrosis. Atrophy of the right kidney relative to the left noted. Right ureter appears normal. The urinary bladder is  decompressed with a Foley catheter in place. Likely left adrenal adenoma noted. The right adrenal gland is unremarkable Stomach/Bowel: Inflammatory change of the descending colon persists but appear mildly improved. Scattered diverticular disease is noted. No pneumatosis or portal venous gas. Small hiatal hernia again seen. The stomach and small bowel are unremarkable. The appendix is normal in appearance. Vascular/Lymphatic: Aortic atherosclerosis. No enlarged abdominal or pelvic lymph nodes. Reproductive: Uterus and bilateral adnexa are unremarkable. Other: Tiny fat containing umbilical hernia noted. There is new stranding in subcutaneous fat along the left side of the abdomen. Musculoskeletal: No acute abnormality. L1 and L2 compression fractures seen on the prior study noted. Advanced bilateral hip osteoarthritis is also present. IMPRESSION: There is new stranding in subcutaneous fat of the left abdomen which may be due to dependent change but is nonspecific. Trace bilateral pleural effusions and dependent atelectasis have mildly increased since the prior CT. Resolved left hydronephrosis with a new double-J ureteral stone in good position. 3-4 punctate left renal stones are again seen. Some improvement of inflammatory change about the descending colon. Fatty infiltration of the liver. Aortic Atherosclerosis (ICD10-I70.0). Electronically Signed   By: Inge Rise M.D.   On: 05/20/2020 15:11   DG CHEST PORT 1 VIEW  Result Date: 05/19/2020 CLINICAL DATA:  Hypoxia EXAM: PORTABLE CHEST 1 VIEW COMPARISON:  05/17/2020 FINDINGS: Cardiac shadow is stable. Aortic calcifications are seen. Mild vascular congestion is seen without edema. No focal infiltrate is seen. No pneumothorax is noted. IMPRESSION: Mild central vascular congestion without interstitial edema. Electronically Signed   By: Inez Catalina M.D.   On: 05/19/2020 22:23       Assessment / Plan:    #1 74 yo female with urinary sepsis/Proteus  bacteremia secondary to left ureteral stone.  She is status post ureteral stent placement on the left 4 days ago. Patient had hypoxia early yesterday and had spiked a temp. Repeat CT without contrast did not show any evidence of pyelonephritis or abscess and hydronephrosis had resolved.  Distal colonic inflammatory changes also appear to be improved. Patient's labs are improving, and after diuresis yesterday she looks better and is feeling a bit better today  #2 IBD/indeterminate colitis-patient had been on Humira prior to hospitalization.  She had been complaining of increased diarrhea over the past 4 to 6 weeks and had just completed a course of steroids at the time of admission. Humira is currently on hold and will remain on hold until her urinary sepsis has completely resolved and stone has been removed.  Still awaiting results of Humira antibody and drug level. This will help determine whether she should be continued on Humira or perhaps switch Biologics.  #3 probable oral pharyngeal Candidiasis- start oral care, start Mycostatin oral suspension 5 cc QID, consider Diflucan if no improvement in next day or 2  Continue azathioprine at current dose Increased Lialda to 4.8 g yesterday. Start budesonide 9 mg p.o. daily tomorrow a.m. Gi will follow up on Monday -call for problems in the interim She has Follow up office appt with Tye Savoy, NP on 05/31/2020 at 9 AM. We have also scheduled her follow-up with Dr. [name] in early November.  Principal Problem:   Sepsis ([place]) Active Problems:   AKI (acute kidney injury) ([place])   Diarrhea   IBD (inflammatory bowel disease)   Immunosuppression ([place])   Bacteremia   Abnormal CT scan, gastrointestinal tract   Acute renal failure (H[facility])   Anxiety   Sepsis due to gram-negative urinary tract infection ([facility])   Hypoxia   Bacterial infection due to Proteus mirabilis   Hypervolemia     LOS: 4 days   Shalla Bulluck PA-C 05/21/2020, 8:43  AM

## 2020-05-22 DIAGNOSIS — F419 Anxiety disorder, unspecified: Secondary | ICD-10-CM | POA: Diagnosis not present

## 2020-05-22 DIAGNOSIS — A419 Sepsis, unspecified organism: Secondary | ICD-10-CM | POA: Diagnosis not present

## 2020-05-22 DIAGNOSIS — A498 Other bacterial infections of unspecified site: Secondary | ICD-10-CM | POA: Diagnosis not present

## 2020-05-22 DIAGNOSIS — N179 Acute kidney failure, unspecified: Secondary | ICD-10-CM | POA: Diagnosis not present

## 2020-05-22 LAB — CBC WITH DIFFERENTIAL/PLATELET
Abs Immature Granulocytes: 0.08 10*3/uL — ABNORMAL HIGH (ref 0.00–0.07)
Basophils Absolute: 0 10*3/uL (ref 0.0–0.1)
Basophils Relative: 0 %
Eosinophils Absolute: 0.1 10*3/uL (ref 0.0–0.5)
Eosinophils Relative: 1 %
HCT: 28.2 % — ABNORMAL LOW (ref 36.0–46.0)
Hemoglobin: 8.5 g/dL — ABNORMAL LOW (ref 12.0–15.0)
Immature Granulocytes: 1 %
Lymphocytes Relative: 12 %
Lymphs Abs: 1.3 10*3/uL (ref 0.7–4.0)
MCH: 23 pg — ABNORMAL LOW (ref 26.0–34.0)
MCHC: 30.1 g/dL (ref 30.0–36.0)
MCV: 76.2 fL — ABNORMAL LOW (ref 80.0–100.0)
Monocytes Absolute: 0.6 10*3/uL (ref 0.1–1.0)
Monocytes Relative: 6 %
Neutro Abs: 8.5 10*3/uL — ABNORMAL HIGH (ref 1.7–7.7)
Neutrophils Relative %: 80 %
Platelets: 139 10*3/uL — ABNORMAL LOW (ref 150–400)
RBC: 3.7 MIL/uL — ABNORMAL LOW (ref 3.87–5.11)
RDW: 19.1 % — ABNORMAL HIGH (ref 11.5–15.5)
WBC: 10.7 10*3/uL — ABNORMAL HIGH (ref 4.0–10.5)
nRBC: 0 % (ref 0.0–0.2)

## 2020-05-22 LAB — BASIC METABOLIC PANEL WITH GFR
Anion gap: 11 (ref 5–15)
BUN: 19 mg/dL (ref 8–23)
CO2: 30 mmol/L (ref 22–32)
Calcium: 8 mg/dL — ABNORMAL LOW (ref 8.9–10.3)
Chloride: 99 mmol/L (ref 98–111)
Creatinine, Ser: 1.41 mg/dL — ABNORMAL HIGH (ref 0.44–1.00)
GFR, Estimated: 37 mL/min — ABNORMAL LOW
Glucose, Bld: 109 mg/dL — ABNORMAL HIGH (ref 70–99)
Potassium: 3.3 mmol/L — ABNORMAL LOW (ref 3.5–5.1)
Sodium: 140 mmol/L (ref 135–145)

## 2020-05-22 LAB — MAGNESIUM: Magnesium: 2.1 mg/dL (ref 1.7–2.4)

## 2020-05-22 MED ORDER — POTASSIUM CHLORIDE CRYS ER 20 MEQ PO TBCR
40.0000 meq | EXTENDED_RELEASE_TABLET | Freq: Once | ORAL | Status: AC
  Administered 2020-05-22: 40 meq via ORAL
  Filled 2020-05-22: qty 2

## 2020-05-22 MED ORDER — CEFAZOLIN SODIUM-DEXTROSE 2-4 GM/100ML-% IV SOLN
2.0000 g | Freq: Three times a day (TID) | INTRAVENOUS | Status: DC
  Administered 2020-05-22 – 2020-05-23 (×3): 2 g via INTRAVENOUS
  Filled 2020-05-22 (×3): qty 100

## 2020-05-22 NOTE — Progress Notes (Signed)
Physical Therapy Treatment Patient Details Name: Rhonda Bailey MRN: [id number] DOB: 1945-12-12 Today's Date: 05/22/2020    History of Present Illness 74 y.o. female with medical history of inflammatory bowel disease initially diagnosed via colonoscopy on 12/04/2019.  The patient also has history of hypothyroidism, anxiety, and iron deficiency anemia.  admitted with nausea, vomiting, and diarrhea. went into respiratory distress d/t fluid overload requiring 5L O2, down to 2L at time of PT eval    PT Comments    Pt progressing toward PT goals. Agreeable to amb, able to amb ~ 30' with RW and min assist for balance, to maneuver RW. Pt fatigued after this distance, if continued slower progress may benefit from HHPT; pt remained on 2L [place] O2  For amb with sats ~ 91-94%. Continue PT POC.  Follow Up Recommendations  No PT follow up;Other (comment);Home health PT (pending progress)     Equipment Recommendations  None recommended by PT    Recommendations for Other Services       Precautions / Restrictions Precautions Precautions: Fall Precaution Comments: monitor O2    Mobility  Bed Mobility               General bed mobility comments: in recliner   Transfers Overall transfer level: Needs assistance Equipment used: Rolling walker (2 wheeled) Transfers: Sit to/from Stand Sit to Stand: Min assist         General transfer comment: assist to rise and safely transition to RW, cues for hand placement   Ambulation/Gait Ambulation/Gait assistance: Min assist Gait Distance (Feet): 30 Feet Assistive device: Rolling walker (2 wheeled) Gait Pattern/deviations: Step-through pattern;Decreased stride length     General Gait Details: cues for trunk extension, breathing, RW safety    Stairs             Wheelchair Mobility    Modified Rankin (Stroke Patients Only)       Balance           Standing balance support: Bilateral upper extremity supported Standing  balance-Leahy Scale: Fair                              Cognition Arousal/Alertness: Awake/alert Behavior During Therapy: WFL for tasks assessed/performed Overall Cognitive Status: Within Functional Limits for tasks assessed                                        Exercises      General Comments        Pertinent Vitals/Pain Pain Assessment: No/denies pain    Home Living                      Prior Function            PT Goals (current goals can now be found in the care plan section) Acute Rehab PT Goals Patient Stated Goal: home soon, feel better PT Goal Formulation: With patient Time For Goal Achievement: 06/04/20 Potential to Achieve Goals: Good Progress towards PT goals: Progressing toward goals    Frequency    Min 3X/week      PT Plan Current plan remains appropriate    Co-evaluation              AM-PAC PT "6 Clicks" Mobility   Outcome Measure  Help needed turning from your back to your side  while in a flat bed without using bedrails?: A Little Help needed moving from lying on your back to sitting on the side of a flat bed without using bedrails?: A Little Help needed moving to and from a bed to a chair (including a wheelchair)?: A Little Help needed standing up from a chair using your arms (e.g., wheelchair or bedside chair)?: A Little Help needed to walk in hospital room?: A Little Help needed climbing 3-5 steps with a railing? : A Little 6 Click Score: 18    End of Session Equipment Utilized During Treatment: Gait belt Activity Tolerance: Patient limited by fatigue Patient left: with call bell/phone within reach;Other (comment) (bathroom, NT made aware) Nurse Communication: Mobility status PT Visit Diagnosis: Difficulty in walking, not elsewhere classified (R26.2)     Time: 8676-1950 PT Time Calculation (min) (ACUTE ONLY): 18 min  Charges:  $Gait Training: 8-22 mins                     Baxter Flattery,  PT  Acute Rehab Dept ([place]) [phone] Pager [phone]  05/22/2020    [facility] 05/22/2020, 1:46 PM

## 2020-05-22 NOTE — Progress Notes (Signed)
PROGRESS NOTE    Rhonda Bailey  YSA:[id number] DOB: 05/17/1946 DOA: 05/17/2020 PCP: [name], MD   Chief Complaint  Patient presents with  . Weakness    Brief Narrative:  Rhonda Bailey a 74 y.o.femalewith medical history ofinflammatory bowel disease initially diagnosed via colonoscopy on 12/04/2019. The patient also has history of hypothyroidism, anxiety, and iron deficiency anemia. The patient has been receiving Humira under the guidance of Dr. [name].In addition, the patient has been on Lialda and azathioprine as well as a prednisone taper for her inflammatory bowel disease. She is down to prednisone 5 mg daily with which she is due to finish in 3 days. She presents with 1 week history of nausea, vomiting, and diarrhea. The patient has been having 8 loose stools on a daily basis without any hematochezia. She has having some black stools, but she is taking iron. Because of her vomiting, the patient states that she has not been able to keep down her medications for about 48 hours prior to this admission. She has had increasing generalized weakness and difficulty getting out of bed today.   In the emergency department, the patient was afebrile with hypotension initially 81/68. She was started on IV fluids with some improvement of her blood pressure. BMP showed a serum creatinine of 4.33 which is above her usual baseline. LFTs were unremarkable. WBC 36.3, hemoglobin 10.8, platelets 215,000. Hospitalist was contacted for admission for further evaluation and treatment. She has been fully vaccinated for COVID with Moderna.  Worked up revealed left ureteral stone. She was transferred to [facility] and underwent stent placement by urology, Dr. [name].  GI was also consulted.    Assessment & Plan:   Principal Problem:   Sepsis ([place]) Active Problems:   Hypoxia   Bacterial infection due to Proteus mirabilis   AKI (acute kidney injury) ([place])    Diarrhea   IBD (inflammatory bowel disease)   Immunosuppression (HCC)   Bacteremia   Abnormal CT scan, gastrointestinal tract   Acute renal failure (HCC)   Anxiety   Sepsis due to gram-negative urinary tract infection (HCC)   Hypervolemia  1 severe sepsis secondary to obstructive urethral stone and Proteus bacteremia, POA Patient on admission met criteria for sepsis with tachycardia, significant leukocytosis, hypotension which was responsive to IV fluids and IV antibiotics, acute renal failure.  CT abdomen and pelvis done concerning for obstructive left ureteral stone with hydroureteronephrosis and wall thickening involving much of the colon concerning for infectious versus inflammatory colitis.  Patient was seen in consultation by urology and patient status post left ureteral stent placement 05/17/2020 per Dr. [name].  Blood cultures positive for Proteus Mirabilis which is pansensitive.  Patient with improvement with leukocytosis.  Patient currently afebrile.  Patient noted to have a low-grade temp on 05/20/2020, felt terrible with ongoing bilateral CVA tenderness and as such repeat CT abdomen and pelvis was obtained which showed new stranding and subcutaneous fat of the left abdomen due to dependent change, trace bilateral pleural effusions and dependent atelectasis have mildly increased since prior CT, resolved left hydronephrosis with good stent placement, 3-4 punctate left renal stone seen again, improvement of inflammatory change about descending colon, fatty infiltration of the liver.  Patient has been reassessed by urology and recommending continue current regimen of antibiotics and no further intervention needed at this time.  Urology recommending outpatient follow-up in 2 to 3 weeks to have definitive stone removal surgery scheduled.  IV antibiotics were narrowed to IV Ancef.  If  continued improvement could transition to oral antibiotics tomorrow to complete 2-week course of antibiotics with day  #1 being when stent was placed.  Urology was following.  Supportive care.  2.  Hypoxia secondary to volume overload Patient noted to be hypoxic the evening of 05/19/2020, with some lethargy per RN, patient with sats of 93% on 5 L nasal cannula.  Patient noted to be volume overloaded on examination secondary to aggressive fluid resuscitation on presentation.  Patient placed on IV Lasix with a urine output of 1.7 L over the past 24 hours.  Patient is -1.9 L during this hospitalization.  Chest x-ray done concerning for congestion.  Patient has improved clinically.  Patient currently with sats of 94% on 2 L nasal cannula.  Chest x-ray which was done was concerning for congestion.  IV fluids have been saline lock.  IV Lasix discontinued.  Strict I's and O's.  Daily weights.  Supportive care.  Wean O2.  Follow.   3.  Inflammatory bowel disease CT abdomen and pelvis revealed colonic wall thickening as well as diverticulosis.  Patient presented with nausea vomiting and diarrhea.  Nausea vomiting improved however still with ongoing diarrhea.  Patient noted to be on Humira, azathioprine, Lialda, steroid taper in the outpatient setting.  C. difficile PCR which was done was negative.  Fecal calprotectin elevated at 725.  GI consulted and are following and due to elevated calprotectin, concern for inflammatory component to her diarrhea in the setting of IBD.  Mesalamine has been increased to 4.8 g daily per GI recommendations.  GI holding off on steroid treatment or resumption of Humira at this time due to active infection.  Continue azathioprine.  Humira level and antibody test pending.  Diet advanced to a soft diet.  Per GI.   4.  Acute renal failure/metabolic acidosis Secondary to problem #1/post renal azotemia secondary to obstructing ureteral stone.  Status post urethral stent placement.  Urine output of 1.7 L over the past 24 hours.  Renal function improving.  Due to concern for volume overload patient was placed  on IV Lasix which have subsequently been discontinued.  Strict I's and O's.  Daily weights.  Supportive care.   5.  Hypothyroidism Synthroid.   6.  Anxiety Continue trazodone, alprazolam.  7.  Anemia of chronic disease/iron deficiency anemia Likely dilutional in nature.  Patient with no overt bleeding.  Hemoglobin trickling down currently at 8.5 from 9.6 yesterday.  Anemia panel consistent with iron deficiency anemia/anemia of chronic disease.  Check FOBT.  Transfusion threshold hemoglobin <7.  GI following and appreciate input and recommendations.   8.  Hypokalemia/hypomagnesemia Likely secondary to GI losses and diuretics.  Potassium at 3.3.  Magnesium at 2.1.  K. Dur 40 mEq p.o. x1.  Follow.    DVT prophylaxis: Heparin Code Status: Full Family Communication: Updated patient.  Updated daughter, Lenna Sciara at bedside. Disposition:   Status is: Inpatient    Dispo: The patient is from: Home              Anticipated d/c is to: Home              Anticipated d/c date is: 3-4 days.              Patient currently patient with bacteremia likely seeded from the urine, status post stent placement for urethral stone, on IV antibiotics, CVA tenderness, diarrhea, flare of inflammatory bowel disease.  Not stable for discharge.        Consultants:  Gastroenterology: Dr. Hilarie Fredrickson 05/18/2020  Urology: Dr. [name] 05/17/2020    Procedures:   CT abdomen and pelvis 05/17/2020, 05/20/2020  Chest x-ray 05/17/2020  Cystoscopy/left urethral stent placement/left retrograde pyelography with interpretation per Dr. [name], urology 05/17/2020  Antimicrobials:  IV Rocephin 05/17/2020>>> 05/21/2020  IV Ancef 05/21/2020   Subjective: Patient sleeping but arousable.  Denies any chest pain.  Improvement with shortness of breath.  Still with loose stools.  States left CVA tenderness has essentially resolved.  Some improvement with right CVA tenderness.  Tolerating current diet.  Patient denies any  overt bleeding.  Daughter at bedside.   Objective: Vitals:   05/21/20 1308 05/21/20 2211 05/22/20 0649 05/22/20 1016  BP: (!) 100/52 (!) 119/48 (!) 121/58 (!) 122/53  Pulse: 99 (!) 102 98 (!) 101  Resp: 18 16 18 14   Temp: 98.3 F (36.8 C) 98.2 F (36.8 C) 98.6 F (37 C) 98.7 F (37.1 C)  TempSrc:  Oral  Oral  SpO2: 97% 96% 92% 94%  Weight:      Height:        Intake/Output Summary (Last 24 hours) at 05/22/2020 1121 Last data filed at 05/22/2020 0300 Gross per 24 hour  Intake 560 ml  Output 1150 ml  Net -590 ml   Filed Weights   05/17/20 0936 05/21/20 0950  Weight: 73.9 kg 78.2 kg    Examination:  General exam: NAD. Respiratory system: Decreased bibasilar crackles.  No wheezing.  Fair air movement.  Speaking in full sentences. Cardiovascular system: RRR no murmurs rubs or gallops.  No JVD.  No lower extremity edema.  Gastrointestinal system: Improving right CVA tenderness.  No left CVA tenderness.  Abdomen is soft, nondistended, nontender, positive bowel sounds.  No rebound.  No guarding. Central nervous system: Alert and oriented.  No focal neurological deficits.  Moving extremities spontaneously.   Extremities: Symmetric 5 x 5 power. Skin: No rashes, lesions or ulcers Psychiatry: Judgement and insight appear normal. Mood & affect appropriate.     Data Reviewed: I have personally reviewed following labs and imaging studies  CBC: Recent Labs  Lab 05/18/20 0746 05/19/20 0455 05/20/20 0638 05/21/20 0442 05/22/20 0740  WBC 20.8* 21.1* 11.7* 11.9* 10.7*  NEUTROABS  --   --  9.8* 9.2* 8.5*  HGB 9.8* 9.9* 10.6* 9.6* 8.5*  HCT 33.1* 33.0* 34.2* 31.5* 28.2*  MCV 78.1* 76.0* 74.8* 75.9* 76.2*  PLT 153 132* 99* 95* 139*    Basic Metabolic Panel: Recent Labs  Lab 05/18/20 0930 05/19/20 0455 05/20/20 0638 05/21/20 0442 05/22/20 0740  NA 140 143 143 137 140  K 4.7 4.2 3.4* 3.1* 3.3*  CL 111 115* 103 93* 99  CO2 17* 16* 24 33* 30  GLUCOSE 147* 143* 94 105*  109*  BUN 42* 32* 18 21 19   CREATININE 2.55* 1.61* 1.34* 1.51* 1.41*  CALCIUM 8.2* 8.7* 8.3* 8.1* 8.0*  MG  --   --  1.5* 2.4 2.1  PHOS  --   --  2.7 3.3  --     GFR: Estimated Creatinine Clearance: 36.2 mL/min (A) (by C-G formula based on SCr of 1.41 mg/dL (H)).  Liver Function Tests: Recent Labs  Lab 05/17/20 1015 05/20/20 0638 05/21/20 0442  AST 32  --   --   ALT 32  --   --   ALKPHOS 130*  --   --   BILITOT 0.8  --   --   PROT 5.9*  --   --   ALBUMIN 2.8* 2.5*  2.3*    CBG: No results for input(s): GLUCAP in the last 168 hours.   Recent Results (from the past 240 hour(s))  Respiratory Panel by RT PCR (Flu A&B, Covid) - Nasopharyngeal Swab     Status: None   Collection Time: 05/17/20 11:21 AM   Specimen: Nasopharyngeal Swab  Result Value Ref Range Status   SARS Coronavirus 2 by RT PCR NEGATIVE NEGATIVE Final    Comment: (NOTE) SARS-CoV-2 target nucleic acids are NOT DETECTED.  The SARS-CoV-2 RNA is generally detectable in upper respiratoy specimens during the acute phase of infection. The lowest concentration of SARS-CoV-2 viral copies this assay can detect is 131 copies/mL. A negative result does not preclude SARS-Cov-2 infection and should not be used as the sole basis for treatment or other patient management decisions. A negative result may occur with  improper specimen collection/handling, submission of specimen other than nasopharyngeal swab, presence of viral mutation(s) within the areas targeted by this assay, and inadequate number of viral copies (<131 copies/mL). A negative result must be combined with clinical observations, patient history, and epidemiological information. The expected result is Negative.  Fact Sheet for Patients:  PinkCheek.be  Fact Sheet for Healthcare Providers:  GravelBags.it  This test is no t yet approved or cleared by the [place] FDA and  has been authorized  for detection and/or diagnosis of SARS-CoV-2 by FDA under an Emergency Use Authorization (EUA). This EUA will remain  in effect (meaning this test can be used) for the duration of the COVID-19 declaration under Section 564(b)(1) of the Act, 21 U.S.C. section 360bbb-3(b)(1), unless the authorization is terminated or revoked sooner.     Influenza A by PCR NEGATIVE NEGATIVE Final   Influenza B by PCR NEGATIVE NEGATIVE Final    Comment: (NOTE) The Xpert Xpress SARS-CoV-2/FLU/RSV assay is intended as an aid in  the diagnosis of influenza from Nasopharyngeal swab specimens and  should not be used as a sole basis for treatment. Nasal washings and  aspirates are unacceptable for Xpert Xpress SARS-CoV-2/FLU/RSV  testing.  Fact Sheet for Patients: PinkCheek.be  Fact Sheet for Healthcare Providers: GravelBags.it  This test is not yet approved or cleared by the [place] FDA and  has been authorized for detection and/or diagnosis of SARS-CoV-2 by  FDA under an Emergency Use Authorization (EUA). This EUA will remain  in effect (meaning this test can be used) for the duration of the  Covid-19 declaration under Section 564(b)(1) of the Act, 21  U.S.C. section 360bbb-3(b)(1), unless the authorization is  terminated or revoked. Performed at [facility], [address], [place], [place] [address]   Culture, blood (Routine X 2) w Reflex to ID Panel     Status: Abnormal   Collection Time: 05/17/20 12:48 PM   Specimen: BLOOD  Result Value Ref Range Status   Specimen Description   Final    BLOOD RIGHT ANTECUBITAL Performed at [facility], [address] [place], [place] [address]    Special Requests   Final    BOTTLES DRAWN AEROBIC AND ANAEROBIC Blood Culture results may not be optimal due to an inadequate volume of blood received in culture bottles Performed at [facility], [address], [place], [place] [address]    Culture   Setup Time   Final    IN BOTH AEROBIC AND ANAEROBIC BOTTLES GRAM NEGATIVE RODS PATIENT DISCHARGED OR EXPIRED Gram Stain Report Called to,Read Back By and Verified With: ONAEGBUAM,I RN @ [facility] @0730  05/18/2020 BY MATTHEWS, B  Culture PROTEUS MIRABILIS (A)  Final   Report Status 05/20/2020 FINAL  Final   Organism ID, Bacteria PROTEUS MIRABILIS  Final      Susceptibility   Proteus mirabilis - MIC*    AMPICILLIN <=2 SENSITIVE Sensitive     CEFAZOLIN <=4 SENSITIVE Sensitive     CEFEPIME <=0.12 SENSITIVE Sensitive     CEFTAZIDIME <=1 SENSITIVE Sensitive     CEFTRIAXONE <=0.25 SENSITIVE Sensitive     CIPROFLOXACIN <=0.25 SENSITIVE Sensitive     GENTAMICIN <=1 SENSITIVE Sensitive     IMIPENEM 2 SENSITIVE Sensitive     TRIMETH/SULFA <=20 SENSITIVE Sensitive     AMPICILLIN/SULBACTAM <=2 SENSITIVE Sensitive     PIP/TAZO <=4 SENSITIVE Sensitive     * PROTEUS MIRABILIS  Blood Culture ID Panel (Reflexed)     Status: Abnormal   Collection Time: 05/17/20 12:48 PM  Result Value Ref Range Status   Enterococcus faecalis NOT DETECTED NOT DETECTED Final   Enterococcus Faecium NOT DETECTED NOT DETECTED Final   Listeria monocytogenes NOT DETECTED NOT DETECTED Final   Staphylococcus species NOT DETECTED NOT DETECTED Final   Staphylococcus aureus (BCID) NOT DETECTED NOT DETECTED Final   Staphylococcus epidermidis NOT DETECTED NOT DETECTED Final   Staphylococcus lugdunensis NOT DETECTED NOT DETECTED Final   Streptococcus species NOT DETECTED NOT DETECTED Final   Streptococcus agalactiae NOT DETECTED NOT DETECTED Final   Streptococcus pneumoniae NOT DETECTED NOT DETECTED Final   Streptococcus pyogenes NOT DETECTED NOT DETECTED Final   A.calcoaceticus-baumannii NOT DETECTED NOT DETECTED Final   Bacteroides fragilis NOT DETECTED NOT DETECTED Final   Enterobacterales DETECTED (A) NOT DETECTED Final    Comment: Enterobacterales represent a large order of gram negative bacteria, not a single  organism. CRITICAL RESULT CALLED TO, READ BACK BY AND VERIFIED WITH: Guadlupe Spanish PharmD 10:30 05/18/20 ([email or web address])    Enterobacter cloacae complex NOT DETECTED NOT DETECTED Final   Escherichia coli NOT DETECTED NOT DETECTED Final   Klebsiella aerogenes NOT DETECTED NOT DETECTED Final   Klebsiella oxytoca NOT DETECTED NOT DETECTED Final   Klebsiella pneumoniae NOT DETECTED NOT DETECTED Final   Proteus species DETECTED (A) NOT DETECTED Final    Comment: CRITICAL RESULT CALLED TO, READ BACK BY AND VERIFIED WITH: Guadlupe Spanish PharmD 10:30 05/18/20 ([email or web address])    Salmonella species NOT DETECTED NOT DETECTED Final   Serratia marcescens NOT DETECTED NOT DETECTED Final   Haemophilus influenzae NOT DETECTED NOT DETECTED Final   Neisseria meningitidis NOT DETECTED NOT DETECTED Final   Pseudomonas aeruginosa NOT DETECTED NOT DETECTED Final   Stenotrophomonas maltophilia NOT DETECTED NOT DETECTED Final   Candida albicans NOT DETECTED NOT DETECTED Final   Candida auris NOT DETECTED NOT DETECTED Final   Candida glabrata NOT DETECTED NOT DETECTED Final   Candida krusei NOT DETECTED NOT DETECTED Final   Candida parapsilosis NOT DETECTED NOT DETECTED Final   Candida tropicalis NOT DETECTED NOT DETECTED Final   Cryptococcus neoformans/gattii NOT DETECTED NOT DETECTED Final   CTX-M ESBL NOT DETECTED NOT DETECTED Final   Carbapenem resistance IMP NOT DETECTED NOT DETECTED Final   Carbapenem resistance KPC NOT DETECTED NOT DETECTED Final   Carbapenem resistance NDM NOT DETECTED NOT DETECTED Final   Carbapenem resist OXA 48 LIKE NOT DETECTED NOT DETECTED Final   Carbapenem resistance VIM NOT DETECTED NOT DETECTED Final    Comment: Performed at [facility], [address], [place], [place] [address]  Culture, blood (Routine X 2) w Reflex to ID Panel  Status: Abnormal   Collection Time: 05/17/20  1:46 PM   Specimen: BLOOD  Result Value Ref Range Status   Specimen Description   Final     BLOOD Performed at [facility], [address], [place], [place] [address]    Special Requests   Final    NONE Performed at [facility], [address] [place], [place] [address]    Culture  Setup Time   Final    AEROBIC BOTTLE ONLY GRAM NEGATIVE RODS PATIENT DISCHARGED OR EXPIRED ANAEROBIC BOTTLE GRAM NEGATIVE RODS [facility] RN AT [facility]@0730  BY [facility] 10.12.2021   Performed at [facility], [address] [place], [place]    Culture (A)  Final    PROTEUS MIRABILIS SUSCEPTIBILITIES PERFORMED ON PREVIOUS CULTURE WITHIN THE LAST 5 DAYS. Performed at [facility], [place] [address], [place], [place]    Report Status 05/20/2020 FINAL  Final  C Difficile Quick Screen w PCR reflex     Status: None   Collection Time: 05/18/20  4:18 AM  Result Value Ref Range Status   C Diff antigen NEGATIVE NEGATIVE Final   C Diff toxin NEGATIVE NEGATIVE Final   C Diff interpretation No C. difficile detected.  Final    Comment: Performed at [facility], [place] [address] [place], [place]  Gastrointestinal Panel by PCR , Stool     Status: None   Collection Time: 05/18/20  4:18 AM  Result Value Ref Range Status   Campylobacter species NOT DETECTED NOT DETECTED Final   Plesimonas shigelloides NOT DETECTED NOT DETECTED Final   Salmonella species NOT DETECTED NOT DETECTED Final   Yersinia enterocolitica NOT DETECTED NOT DETECTED Final   Vibrio species NOT DETECTED NOT DETECTED Final   Vibrio cholerae NOT DETECTED NOT DETECTED Final   Enteroaggregative E coli (EAEC) NOT DETECTED NOT DETECTED Final   Enteropathogenic E coli (EPEC) NOT DETECTED NOT DETECTED Final   Enterotoxigenic E coli (ETEC) NOT DETECTED NOT DETECTED Final   Shiga like toxin producing E coli (STEC) NOT DETECTED NOT DETECTED Final   Shigella/Enteroinvasive E coli (EIEC) NOT DETECTED NOT DETECTED Final   Cryptosporidium NOT DETECTED NOT DETECTED Final   Cyclospora cayetanensis NOT  DETECTED NOT DETECTED Final   Entamoeba histolytica NOT DETECTED NOT DETECTED Final   Giardia lamblia NOT DETECTED NOT DETECTED Final   Adenovirus F40/41 NOT DETECTED NOT DETECTED Final   Astrovirus NOT DETECTED NOT DETECTED Final   Norovirus GI/GII NOT DETECTED NOT DETECTED Final   Rotavirus A NOT DETECTED NOT DETECTED Final   Sapovirus (I, II, IV, and V) NOT DETECTED NOT DETECTED Final    Comment: Performed at [facility], [address], [place], [place]  Urine Culture     Status: None   Collection Time: 05/18/20  4:18 AM   Specimen: Urine, Clean Catch  Result Value Ref Range Status   Specimen Description   Final    URINE, CLEAN CATCH Performed at [facility], [place] [address] [place], [place]    Special Requests   Final    NONE Performed at [facility], [place] [address] [place], [place]    Culture   Final    NO GROWTH Performed at [facility], [place] [address], [place], [place]    Report Status 05/19/2020 FINAL  Final  Calprotectin, Fecal     Status: Abnormal   Collection Time: 05/18/20  1:06  PM   Specimen: Stool  Result Value Ref Range Status   Calprotectin, Fecal 725 (H) 0 - 120 ug/g Final    Comment: (NOTE) Concentration     Interpretation   Follow-Up <16 - 50 ug/g     Normal           None >50 -120 ug/g     Borderline       Re-evaluate in 4-6 weeks    >120 ug/g     Abnormal         Repeat as clinically                                   indicated Performed At: [facility] [place], [place] [id number] Rush Farmer MD BC:[id number]          Radiology Studies: CT ABDOMEN PELVIS WO CONTRAST  Result Date: 05/20/2020 CLINICAL DATA:  Patient diagnosed with inflammatory bowel disease by colonoscopy 12/04/2019. The patient was also diagnosed with a left ureteral stone 05/17/2020. Nausea, vomiting and diarrhea for a little over 1 week. EXAM: CT ABDOMEN  AND PELVIS WITHOUT CONTRAST TECHNIQUE: Multidetector CT imaging of the abdomen and pelvis was performed following the standard protocol without IV contrast. COMPARISON:  CT abdomen and pelvis 05/17/2020. FINDINGS: Lower chest: Trace bilateral pleural effusions and mild dependent atelectasis, slightly increased since the prior CT. Hepatobiliary: Status post cholecystectomy. A few small calcifications in the liver are again seen. The liver is low attenuating consistent with fatty infiltration. Biliary tree is negative. Pancreas: Fatty replacement again seen.  Otherwise negative. Spleen: Normal in size without focal abnormality. Adrenals/Urinary Tract: Since the prior examination, the patient has undergone placement of a left ureteral stent which is in good position. Previously seen left ureteral stone is no longer identified and hydronephrosis has resolved. 3-4 punctate left renal stones are seen. There are no right renal stones or hydronephrosis. Atrophy of the right kidney relative to the left noted. Right ureter appears normal. The urinary bladder is decompressed with a Foley catheter in place. Likely left adrenal adenoma noted. The right adrenal gland is unremarkable Stomach/Bowel: Inflammatory change of the descending colon persists but appear mildly improved. Scattered diverticular disease is noted. No pneumatosis or portal venous gas. Small hiatal hernia again seen. The stomach and small bowel are unremarkable. The appendix is normal in appearance. Vascular/Lymphatic: Aortic atherosclerosis. No enlarged abdominal or pelvic lymph nodes. Reproductive: Uterus and bilateral adnexa are unremarkable. Other: Tiny fat containing umbilical hernia noted. There is new stranding in subcutaneous fat along the left side of the abdomen. Musculoskeletal: No acute abnormality. L1 and L2 compression fractures seen on the prior study noted. Advanced bilateral hip osteoarthritis is also present. IMPRESSION: There is new stranding  in subcutaneous fat of the left abdomen which may be due to dependent change but is nonspecific. Trace bilateral pleural effusions and dependent atelectasis have mildly increased since the prior CT. Resolved left hydronephrosis with a new double-J ureteral stone in good position. 3-4 punctate left renal stones are again seen. Some improvement of inflammatory change about the descending colon. Fatty infiltration of the liver. Aortic Atherosclerosis (ICD10-I70.0). Electronically Signed   By: Inge Rise M.D.   On: 05/20/2020 15:11        Scheduled Meds: . azaTHIOprine  50 mg Oral Daily  . budesonide  9 mg Oral Daily  . Chlorhexidine Gluconate Cloth  6 each Topical Daily  .  heparin  5,000 Units Subcutaneous Q8H  . levothyroxine  37.5 mcg Oral QAC breakfast  . mesalamine  4.8 g Oral Q breakfast  . nystatin  5 mL Oral QID  . ondansetron (ZOFRAN) IV  4 mg Intravenous Q6H  . pantoprazole (PROTONIX) IV  40 mg Intravenous Q12H   Continuous Infusions: .  ceFAZolin (ANCEF) IV 2 g (05/22/20 0505)     LOS: 5 days    Time spent: 40 minutes    Irine Seal, MD [facility]   To contact the attending provider between 7A-7P or the covering provider during after hours 7P-7A, please log into the web site [email or web address] and access using universal [place] password for that web site. If you do not have the password, please call the hospital operator.  05/22/2020, 11:21 AM

## 2020-05-22 NOTE — Evaluation (Signed)
Occupational Therapy Evaluation Patient Details Name: Rhonda Bailey MRN: [id number] DOB: 1945/12/28 Today's Date: 05/22/2020    History of Present Illness 74 y.o. female with medical history of inflammatory bowel disease initially diagnosed via colonoscopy on 12/04/2019.  The patient also has history of hypothyroidism, anxiety, and iron deficiency anemia.  admitted with nausea, vomiting, and diarrhea. 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 Pt presenting with poor activity tolerance with fatigue. Pt currently requiring Supervision-Min Guard A for ADLs and functional mobility. SpO2 in 90s on RA throughout session. Pt would benefit from further acute OT to facilitate safe dc. Recommend dc to home with HHOT for further OT to optimize safety, independence with ADLs, and return to PLOF.     Follow Up Recommendations  Home health OT;Supervision/Assistance - 24 hour    Equipment Recommendations  None recommended by OT    Recommendations for Other Services PT consult     Precautions / Restrictions Precautions Precautions: Fall Precaution Comments: monitor O2 Restrictions Weight Bearing Restrictions: No      Mobility Bed Mobility Overal bed mobility: Needs Assistance Bed Mobility: Supine to Sit;Sit to Supine     Supine to sit: Supervision Sit to supine: Supervision   General bed mobility comments: Supervision for safety  Transfers Overall transfer level: Needs assistance Equipment used: Rolling walker (2 wheeled) Transfers: Sit to/from Stand Sit to Stand: Min guard         General transfer comment: MIn Guard A for safety    Balance Overall balance assessment: Needs assistance Sitting-balance support: No upper extremity supported;Feet supported Sitting  balance-Leahy Scale: Good     Standing balance support: Bilateral upper extremity supported;During functional activity Standing balance-Leahy Scale: Fair                             ADL either performed or assessed with clinical judgement   ADL Overall ADL's : Needs assistance/impaired Eating/Feeding: Set up;Sitting   Grooming: Oral care;Supervision/safety;Set up;Standing Grooming Details (indicate cue type and reason): Pt performing oral care at sink with supervision for safety. Pt leaning on her forearm at the sink for energy conservation Upper Body Bathing: Supervision/ safety;Set up;Sitting   Lower Body Bathing: Min guard;Sit to/from stand   Upper Body Dressing : Supervision/safety;Set up;Sitting   Lower Body Dressing: Min guard;Sit to/from stand   Toilet Transfer: Min guard;Ambulation;RW Toilet Transfer Details (indicate cue type and reason): Min Guard A for safety         Functional mobility during ADLs: Min guard;Rolling walker General ADL Comments: Pt performing oral care and functional mobility in the hallway with [employer]. Using RW. SpO2 maintaining in 90s on RA.     Vision         Perception     Praxis      Pertinent Vitals/Pain Pain Assessment: No/denies pain     Hand Dominance Right   Extremity/Trunk Assessment Upper Extremity Assessment Upper Extremity Assessment: Overall WFL for tasks assessed   Lower Extremity Assessment Lower Extremity Assessment: Defer to PT evaluation   Cervical / Trunk Assessment Cervical / Trunk Assessment: Kyphotic   Communication Communication Communication: No difficulties   Cognition Arousal/Alertness: Awake/alert Behavior During Therapy: WFL for tasks assessed/performed Overall Cognitive Status:  Within Functional Limits for tasks assessed                                     General Comments  SpO2 maintaiing in 90s on RA. Daughter present throughout session     Exercises     Shoulder Instructions      [place] expects to be discharged to:: Private residence Living Arrangements: Alone Available Help at Discharge: Family Type of Home: House Home Access: Stairs to enter Technical brewer of Steps: 2   [place]: One level     Bathroom Shower/Tub: Walk-in Corporate treasurer Toilet: Handicapped height     Home Equipment: Environmental consultant - 2 wheels;Cane - single point          Prior Functioning/Environment Level of Independence: Independent;Independent with assistive device(s)        Comments: amb with RW for the last wk  prior to admission. pt works at [employer] on [employer]        OT Problem List: Decreased strength;Decreased range of motion;Decreased activity tolerance;Impaired balance (sitting and/or standing);Decreased knowledge of use of DME or AE;Decreased knowledge of precautions      OT Treatment/Interventions: Self-care/ADL training;Therapeutic exercise;Energy conservation;DME and/or AE instruction;Therapeutic activities;Patient/family education    OT Goals(Current goals can be found in the care plan section) Acute Rehab OT Goals Patient Stated Goal: home soon, feel better OT Goal Formulation: With patient/family Time For Goal Achievement: 06/05/20 Potential to Achieve Goals: Good  OT Frequency: Min 2X/week   Barriers to D/C:            Co-evaluation              AM-PAC OT "6 Clicks" Daily Activity     Outcome Measure Help from another person eating meals?: None Help from another person taking care of personal grooming?: A Little Help from another person toileting, which includes using toliet, bedpan, or urinal?: A Little Help from another person bathing (including washing, rinsing, drying)?: A Little Help from another person to put on and taking off regular upper body clothing?: A Little Help from another person to put on and taking off regular lower body clothing?: A Little 6  Click Score: 19   End of Session Equipment Utilized During Treatment: Rolling walker Nurse Communication: Mobility status  Activity Tolerance: Patient tolerated treatment well Patient left: in bed;with call bell/phone within reach;with bed alarm set;with family/visitor present  OT Visit Diagnosis: Unsteadiness on feet (R26.81);Other abnormalities of gait and mobility (R26.89);Muscle weakness (generalized) (M62.81)                Time: 8341-9622 OT Time Calculation (min): 31 min Charges:  OT General Charges $OT Visit: 1 Visit OT Evaluation $OT Eval Moderate Complexity: 1 Mod OT Treatments $Self Care/Home Management : 8-22 mins  Nya Monds MSOT, OTR/L Acute Rehab Pager: [phone] Office: [place] 05/22/2020, 4:54 PM

## 2020-05-23 DIAGNOSIS — N179 Acute kidney failure, unspecified: Secondary | ICD-10-CM | POA: Diagnosis not present

## 2020-05-23 DIAGNOSIS — A498 Other bacterial infections of unspecified site: Secondary | ICD-10-CM | POA: Diagnosis not present

## 2020-05-23 DIAGNOSIS — A419 Sepsis, unspecified organism: Secondary | ICD-10-CM | POA: Diagnosis not present

## 2020-05-23 DIAGNOSIS — F419 Anxiety disorder, unspecified: Secondary | ICD-10-CM | POA: Diagnosis not present

## 2020-05-23 LAB — CBC WITH DIFFERENTIAL/PLATELET
Abs Immature Granulocytes: 0.05 10*3/uL (ref 0.00–0.07)
Basophils Absolute: 0 10*3/uL (ref 0.0–0.1)
Basophils Relative: 0 %
Eosinophils Absolute: 0 10*3/uL (ref 0.0–0.5)
Eosinophils Relative: 0 %
HCT: 31.8 % — ABNORMAL LOW (ref 36.0–46.0)
Hemoglobin: 9.4 g/dL — ABNORMAL LOW (ref 12.0–15.0)
Immature Granulocytes: 1 %
Lymphocytes Relative: 14 %
Lymphs Abs: 1.3 10*3/uL (ref 0.7–4.0)
MCH: 22.8 pg — ABNORMAL LOW (ref 26.0–34.0)
MCHC: 29.6 g/dL — ABNORMAL LOW (ref 30.0–36.0)
MCV: 77.2 fL — ABNORMAL LOW (ref 80.0–100.0)
Monocytes Absolute: 0.5 10*3/uL (ref 0.1–1.0)
Monocytes Relative: 5 %
Neutro Abs: 7.1 10*3/uL (ref 1.7–7.7)
Neutrophils Relative %: 80 %
Platelets: 244 10*3/uL (ref 150–400)
RBC: 4.12 MIL/uL (ref 3.87–5.11)
RDW: 19 % — ABNORMAL HIGH (ref 11.5–15.5)
WBC: 8.9 10*3/uL (ref 4.0–10.5)
nRBC: 0 % (ref 0.0–0.2)

## 2020-05-23 LAB — BASIC METABOLIC PANEL
Anion gap: 11 (ref 5–15)
BUN: 28 mg/dL — ABNORMAL HIGH (ref 8–23)
CO2: 28 mmol/L (ref 22–32)
Calcium: 8.7 mg/dL — ABNORMAL LOW (ref 8.9–10.3)
Chloride: 102 mmol/L (ref 98–111)
Creatinine, Ser: 1.37 mg/dL — ABNORMAL HIGH (ref 0.44–1.00)
GFR, Estimated: 38 mL/min — ABNORMAL LOW (ref >60)
Glucose, Bld: 147 mg/dL — ABNORMAL HIGH (ref 70–99)
Potassium: 4.5 mmol/L (ref 3.5–5.1)
Sodium: 141 mmol/L (ref 135–145)

## 2020-05-23 LAB — OCCULT BLOOD X 1 CARD TO LAB, STOOL: Fecal Occult Bld: POSITIVE — AB

## 2020-05-23 LAB — MAGNESIUM: Magnesium: 2.4 mg/dL (ref 1.7–2.4)

## 2020-05-23 MED ORDER — MAGIC MOUTHWASH W/LIDOCAINE
15.0000 mL | Freq: Four times a day (QID) | ORAL | Status: DC
  Administered 2020-05-23 – 2020-05-24 (×4): 15 mL via ORAL
  Filled 2020-05-23 (×6): qty 15

## 2020-05-23 MED ORDER — VALACYCLOVIR HCL 500 MG PO TABS
1000.0000 mg | ORAL_TABLET | Freq: Two times a day (BID) | ORAL | Status: DC
  Administered 2020-05-23 – 2020-05-24 (×2): 1000 mg via ORAL
  Filled 2020-05-23 (×2): qty 2

## 2020-05-23 MED ORDER — CEPHALEXIN 500 MG PO CAPS
500.0000 mg | ORAL_CAPSULE | Freq: Four times a day (QID) | ORAL | Status: DC
  Administered 2020-05-23 – 2020-05-24 (×6): 500 mg via ORAL
  Filled 2020-05-23 (×6): qty 1

## 2020-05-23 MED ORDER — PANTOPRAZOLE SODIUM 40 MG PO TBEC
40.0000 mg | DELAYED_RELEASE_TABLET | Freq: Two times a day (BID) | ORAL | Status: DC
  Administered 2020-05-23 – 2020-05-24 (×2): 40 mg via ORAL
  Filled 2020-05-23 (×2): qty 1

## 2020-05-23 NOTE — Progress Notes (Signed)
PROGRESS NOTE    Rhonda Bailey  SAY:[id number] DOB: July 08, 1946 DOA: 05/17/2020 PCP: [name], MD   Chief Complaint  Patient presents with  . Weakness    Brief Narrative:  Rhonda Bailey a 74 y.o.femalewith medical history ofinflammatory bowel disease initially diagnosed via colonoscopy on 12/04/2019. The patient also has history of hypothyroidism, anxiety, and iron deficiency anemia. The patient has been receiving Humira under the guidance of Dr. [name].In addition, the patient has been on Lialda and azathioprine as well as a prednisone taper for her inflammatory bowel disease. She is down to prednisone 5 mg daily with which she is due to finish in 3 days. She presents with 1 week history of nausea, vomiting, and diarrhea. The patient has been having 8 loose stools on a daily basis without any hematochezia. She has having some black stools, but she is taking iron. Because of her vomiting, the patient states that she has not been able to keep down her medications for about 48 hours prior to this admission. She has had increasing generalized weakness and difficulty getting out of bed today.   In the emergency department, the patient was afebrile with hypotension initially 81/68. She was started on IV fluids with some improvement of her blood pressure. BMP showed a serum creatinine of 4.33 which is above her usual baseline. LFTs were unremarkable. WBC 36.3, hemoglobin 10.8, platelets 215,000. Hospitalist was contacted for admission for further evaluation and treatment. She has been fully vaccinated for COVID with Moderna.  Worked up revealed left ureteral stone. She was transferred to [facility] and underwent stent placement by urology, Dr. [name].  GI was also consulted.    Assessment & Plan:   Principal Problem:   Sepsis ([place]) Active Problems:   Hypoxia   Bacterial infection due to Proteus mirabilis   AKI (acute kidney injury) ([place])    Diarrhea   IBD (inflammatory bowel disease)   Immunosuppression (HCC)   Bacteremia   Abnormal CT scan, gastrointestinal tract   Acute renal failure (HCC)   Anxiety   Sepsis due to gram-negative urinary tract infection (HCC)   Hypervolemia  1 severe sepsis secondary to obstructive urethral stone and Proteus bacteremia, POA Patient on admission met criteria for sepsis with tachycardia, significant leukocytosis, hypotension which was responsive to IV fluids and IV antibiotics, acute renal failure.  CT abdomen and pelvis done concerning for obstructive left ureteral stone with hydroureteronephrosis and wall thickening involving much of the colon concerning for infectious versus inflammatory colitis.  Patient was seen in consultation by urology and patient status post left ureteral stent placement 05/17/2020 per Dr. [name].  Blood cultures positive for Proteus Mirabilis which is pansensitive.  Patient with improvement with leukocytosis.  Patient currently afebrile.  Patient noted to have a low-grade temp on 05/20/2020, felt terrible with ongoing bilateral CVA tenderness and as such repeat CT abdomen and pelvis was obtained which showed new stranding and subcutaneous fat of the left abdomen due to dependent change, trace bilateral pleural effusions and dependent atelectasis have mildly increased since prior CT, resolved left hydronephrosis with good stent placement, 3-4 punctate left renal stone seen again, improvement of inflammatory change about descending colon, fatty infiltration of the liver.  Patient has been reassessed by urology and recommending continue current regimen of antibiotics and no further intervention needed at this time.  Urology recommending outpatient follow-up in 2 to 3 weeks to have definitive stone removal surgery scheduled.  IV antibiotics were narrowed to IV Ancef. We will  transition from IV antibiotics to oral Keflex to complete a 2-week course of antibiotic treatment with day #1 being  when stent was placed. Discontinue Foley catheter, voiding trial. Urology was following. Supportive care.  2.  Hypoxia secondary to volume overload Patient noted to be hypoxic the evening of 05/19/2020, with some lethargy per RN, patient with sats of 93% on 5 L nasal cannula.  Patient noted to be volume overloaded on examination secondary to aggressive fluid resuscitation on presentation.  Patient placed on IV Lasix with a urine output of 1.875 L over the past 24 hours.  Patient is - 3.587 L during this hospitalization.  Chest x-ray done was concerning for congestion.  Patient has improved clinically. Patient with sats of 95% on room air. Continue pulmonary toilet. Strict I's and O's. Daily weights. Supportive care. Follow.   3.  Inflammatory bowel disease CT abdomen and pelvis revealed colonic wall thickening as well as diverticulosis.  Patient presented with nausea vomiting and diarrhea.  Nausea vomiting improved however still with ongoing diarrhea which is slowly improving per patient with improved consistency.  Patient noted to be on Humira, azathioprine, Lialda, steroid taper in the outpatient setting.  C. difficile PCR which was done was negative.  Fecal calprotectin elevated at 725.  GI consulted and are following and due to elevated calprotectin, concern for inflammatory component to her diarrhea in the setting of IBD.  Mesalamine has been increased to 4.8 g daily per GI recommendations. Patient started on Entocort. GI holding off on steroid treatment or resumption of Humira at this time due to active infection.  Continue azathioprine.  Humira level and antibody test pending.  Diet advanced to a soft diet which patient is tolerating..  Per GI.   4.  Acute renal failure/metabolic acidosis Secondary to problem #1/post renal azotemia secondary to obstructing ureteral stone.  Status post urethral stent placement. Urine output of 1.875 L over the past 24 hours. Renal function improving. Patient status  post IV Lasix which has subsequently been discontinued. Strict I's and O's. Daily weights. Supportive care.  5.  Hypothyroidism Continue Synthroid.  6.  Anxiety Continue trazodone, alprazolam.  7.  Anemia of chronic disease/iron deficiency anemia Likely dilutional in nature.  Patient with no overt bleeding.  Hemoglobin seems to be stabilizing and currently at 9.4 from 8.5 from 9.6. Anemia panel consistent with iron deficiency anemia/anemia of chronic disease. FOBT done was positive. Transfusion threshold hemoglobin <7. GI following and appreciate input and recommendations.   8.  Hypokalemia/hypomagnesemia Likely secondary to GI losses and diuretics.  Potassium at 4.5  Magnesium at 2.4.  Follow.    DVT prophylaxis: Heparin Code Status: Full Family Communication: Updated patient.  Disposition:   Status is: Inpatient    Dispo: The patient is from: Home              Anticipated d/c is to: Home              Anticipated d/c date is: 3-4 days.              Patient currently patient with bacteremia likely seeded from the urine, status post stent placement for urethral stone, being transitioned from IV antibiotics to oral antibiotics, not stable for discharge.       Consultants:   Gastroenterology: Dr. Hilarie Fredrickson 05/18/2020  Urology: Dr. [name] 05/17/2020    Procedures:   CT abdomen and pelvis 05/17/2020, 05/20/2020  Chest x-ray 05/17/2020  Cystoscopy/left urethral stent placement/left retrograde pyelography with interpretation per Dr.  Pace, urology 05/17/2020  Antimicrobials:  IV Rocephin 05/17/2020>>> 05/21/2020  IV Ancef 05/21/2020>>>> 05/23/2020  Keflex 05/23/2020>>>> 05/31/2020   Subjective: Patient sitting up on the side of the bed. Denies any CVA tenderness. States stools with more consistency slowly however still loose. No abdominal pain. Tolerating current diet.  Objective: Vitals:   05/22/20 1016 05/22/20 2144 05/23/20 0500 05/23/20 0507  BP: (!) 122/53  (!) 122/58  131/61  Pulse: (!) 101 93  80  Resp: _0 Temp: 98.7 F (37.1 C) 98 F (36.7 C)  97.7 F (36.5 C)  TempSrc: Axillary Oral  Oral  SpO2: 94% 92%  95%  Weight:   77.5 kg   Height:        Intake/Output Summary (Last 24 hours) at 05/23/2020 1105 Last data filed at 05/23/2020 1059 Gross per 24 hour  Intake 765.98 ml  Output 2175 ml  Net -1409.02 ml   Filed Weights   05/17/20 0936 05/21/20 0950 05/23/20 0500  Weight: 73.9 kg 78.2 kg 77.5 kg    Examination:  General exam: NAD. Respiratory system: Lungs clear to auscultation bilaterally. No wheezes, no crackles, no rhonchi. Speaking in full sentences. Normal respiratory effort. Cardiovascular system: Regular rate rhythm no murmurs rubs or gallops. No JVD. No lower extremity edema. Gastrointestinal system: Abdomen is soft, nontender, nondistended, positive bowel sounds. No rebound. No guarding. No CVA tenderness.  Central nervous system: Alert and oriented.  No focal neurological deficits.  Moving extremities spontaneously.   Extremities: Symmetric 5 x 5 power. Skin: No rashes, lesions or ulcers Psychiatry: Judgement and insight appear normal. Mood & affect appropriate.     Data Reviewed: I have personally reviewed following labs and imaging studies  CBC: Recent Labs  Lab 05/19/20 0455 05/20/20 0638 05/21/20 0442 05/22/20 0740 05/23/20 0637  WBC 21.1* 11.7* 11.9* 10.7* 8.9  NEUTROABS  --  9.8* 9.2* 8.5* 7.1  HGB 9.9* 10.6* 9.6* 8.5* 9.4*  HCT 33.0* 34.2* 31.5* 28.2* 31.8*  MCV 76.0* 74.8* 75.9* 76.2* 77.2*  PLT 132* 99* 95* 139* 793    Basic Metabolic Panel: Recent Labs  Lab 05/19/20 0455 05/20/20 0638 05/21/20 0442 05/22/20 0740 05/23/20 0637  NA 143 143 137 140 141  K 4.2 3.4* 3.1* 3.3* 4.5  CL 115* 103 93* 99 102  CO2 16* 24 33* 30 28  GLUCOSE 143* 94 105* 109* 147*  BUN 32* _1 28*  CREATININE 1.61* 1.34* 1.51* 1.41* 1.37*  CALCIUM 8.7* 8.3* 8.1* 8.0* 8.7*  MG  --  1.5* 2.4 2.1  2.4  PHOS  --  2.7 3.3  --   --     GFR: Estimated Creatinine Clearance: 37.1 mL/min (A) (by C-G formula based on SCr of 1.37 mg/dL (H)).  Liver Function Tests: Recent Labs  Lab 05/17/20 1015 05/20/20 0638 05/21/20 0442  AST 32  --   --   ALT 32  --   --   ALKPHOS 130*  --   --   BILITOT 0.8  --   --   PROT 5.9*  --   --   ALBUMIN 2.8* 2.5* 2.3*    CBG: No results for input(s): GLUCAP in the last 168 hours.   Recent Results (from the past 240 hour(s))  Respiratory Panel by RT PCR (Flu A&B, Covid) - Nasopharyngeal Swab     Status: None   Collection Time: 05/17/20 11:21 AM   Specimen: Nasopharyngeal Swab  Result Value Ref Range Status   SARS Coronavirus  2 by RT PCR NEGATIVE NEGATIVE Final    Comment: (NOTE) SARS-CoV-2 target nucleic acids are NOT DETECTED.  The SARS-CoV-2 RNA is generally detectable in upper respiratoy specimens during the acute phase of infection. The lowest concentration of SARS-CoV-2 viral copies this assay can detect is 131 copies/mL. A negative result does not preclude SARS-Cov-2 infection and should not be used as the sole basis for treatment or other patient management decisions. A negative result may occur with  improper specimen collection/handling, submission of specimen other than nasopharyngeal swab, presence of viral mutation(s) within the areas targeted by this assay, and inadequate number of viral copies (<131 copies/mL). A negative result must be combined with clinical observations, patient history, and epidemiological information. The expected result is Negative.  Fact Sheet for Patients:  PinkCheek.be  Fact Sheet for Healthcare Providers:  GravelBags.it  This test is no t yet approved or cleared by the [place] FDA and  has been authorized for detection and/or diagnosis of SARS-CoV-2 by FDA under an Emergency Use Authorization (EUA). This EUA will remain  in effect  (meaning this test can be used) for the duration of the COVID-19 declaration under Section 564(b)(1) of the Act, 21 U.S.C. section 360bbb-3(b)(1), unless the authorization is terminated or revoked sooner.     Influenza A by PCR NEGATIVE NEGATIVE Final   Influenza B by PCR NEGATIVE NEGATIVE Final    Comment: (NOTE) The Xpert Xpress SARS-CoV-2/FLU/RSV assay is intended as an aid in  the diagnosis of influenza from Nasopharyngeal swab specimens and  should not be used as a sole basis for treatment. Nasal washings and  aspirates are unacceptable for Xpert Xpress SARS-CoV-2/FLU/RSV  testing.  Fact Sheet for Patients: PinkCheek.be  Fact Sheet for Healthcare Providers: GravelBags.it  This test is not yet approved or cleared by the [place] FDA and  has been authorized for detection and/or diagnosis of SARS-CoV-2 by  FDA under an Emergency Use Authorization (EUA). This EUA will remain  in effect (meaning this test can be used) for the duration of the  Covid-19 declaration under Section 564(b)(1) of the Act, 21  U.S.C. section 360bbb-3(b)(1), unless the authorization is  terminated or revoked. Performed at [facility], [address], [place], [place] [address]   Culture, blood (Routine X 2) w Reflex to ID Panel     Status: Abnormal   Collection Time: 05/17/20 12:48 PM   Specimen: BLOOD  Result Value Ref Range Status   Specimen Description   Final    BLOOD RIGHT ANTECUBITAL Performed at [facility], [address] [place], [place] [address]    Special Requests   Final    BOTTLES DRAWN AEROBIC AND ANAEROBIC Blood Culture results may not be optimal due to an inadequate volume of blood received in culture bottles Performed at [facility], [address], [place], [place] [address]    Culture  Setup Time   Final    IN BOTH AEROBIC AND ANAEROBIC BOTTLES GRAM NEGATIVE RODS PATIENT DISCHARGED OR EXPIRED Gram Stain Report  Called to,Read Back By and Verified With: ONAEGBUAM,I RN @ [facility] _0  05/18/2020 BY MATTHEWS, B     Culture PROTEUS MIRABILIS (A)  Final   Report Status 05/20/2020 FINAL  Final   Organism ID, Bacteria PROTEUS MIRABILIS  Final      Susceptibility   Proteus mirabilis - MIC*    AMPICILLIN <=2 SENSITIVE Sensitive     CEFAZOLIN <=4 SENSITIVE Sensitive     CEFEPIME <=0.12 SENSITIVE Sensitive  CEFTAZIDIME <=1 SENSITIVE Sensitive     CEFTRIAXONE <=0.25 SENSITIVE Sensitive     CIPROFLOXACIN <=0.25 SENSITIVE Sensitive     GENTAMICIN <=1 SENSITIVE Sensitive     IMIPENEM 2 SENSITIVE Sensitive     TRIMETH/SULFA <=20 SENSITIVE Sensitive     AMPICILLIN/SULBACTAM <=2 SENSITIVE Sensitive     PIP/TAZO <=4 SENSITIVE Sensitive     * PROTEUS MIRABILIS  Blood Culture ID Panel (Reflexed)     Status: Abnormal   Collection Time: 05/17/20 12:48 PM  Result Value Ref Range Status   Enterococcus faecalis NOT DETECTED NOT DETECTED Final   Enterococcus Faecium NOT DETECTED NOT DETECTED Final   Listeria monocytogenes NOT DETECTED NOT DETECTED Final   Staphylococcus species NOT DETECTED NOT DETECTED Final   Staphylococcus aureus (BCID) NOT DETECTED NOT DETECTED Final   Staphylococcus epidermidis NOT DETECTED NOT DETECTED Final   Staphylococcus lugdunensis NOT DETECTED NOT DETECTED Final   Streptococcus species NOT DETECTED NOT DETECTED Final   Streptococcus agalactiae NOT DETECTED NOT DETECTED Final   Streptococcus pneumoniae NOT DETECTED NOT DETECTED Final   Streptococcus pyogenes NOT DETECTED NOT DETECTED Final   A.calcoaceticus-baumannii NOT DETECTED NOT DETECTED Final   Bacteroides fragilis NOT DETECTED NOT DETECTED Final   Enterobacterales DETECTED (A) NOT DETECTED Final    Comment: Enterobacterales represent a large order of gram negative bacteria, not a single organism. CRITICAL RESULT CALLED TO, READ BACK BY AND VERIFIED WITH: Guadlupe Spanish PharmD 10:30 05/18/20 ([email or web address])    Enterobacter cloacae  complex NOT DETECTED NOT DETECTED Final   Escherichia coli NOT DETECTED NOT DETECTED Final   Klebsiella aerogenes NOT DETECTED NOT DETECTED Final   Klebsiella oxytoca NOT DETECTED NOT DETECTED Final   Klebsiella pneumoniae NOT DETECTED NOT DETECTED Final   Proteus species DETECTED (A) NOT DETECTED Final    Comment: CRITICAL RESULT CALLED TO, READ BACK BY AND VERIFIED WITH: Guadlupe Spanish PharmD 10:30 05/18/20 ([email or web address])    Salmonella species NOT DETECTED NOT DETECTED Final   Serratia marcescens NOT DETECTED NOT DETECTED Final   Haemophilus influenzae NOT DETECTED NOT DETECTED Final   Neisseria meningitidis NOT DETECTED NOT DETECTED Final   Pseudomonas aeruginosa NOT DETECTED NOT DETECTED Final   Stenotrophomonas maltophilia NOT DETECTED NOT DETECTED Final   Candida albicans NOT DETECTED NOT DETECTED Final   Candida auris NOT DETECTED NOT DETECTED Final   Candida glabrata NOT DETECTED NOT DETECTED Final   Candida krusei NOT DETECTED NOT DETECTED Final   Candida parapsilosis NOT DETECTED NOT DETECTED Final   Candida tropicalis NOT DETECTED NOT DETECTED Final   Cryptococcus neoformans/gattii NOT DETECTED NOT DETECTED Final   CTX-M ESBL NOT DETECTED NOT DETECTED Final   Carbapenem resistance IMP NOT DETECTED NOT DETECTED Final   Carbapenem resistance KPC NOT DETECTED NOT DETECTED Final   Carbapenem resistance NDM NOT DETECTED NOT DETECTED Final   Carbapenem resist OXA 48 LIKE NOT DETECTED NOT DETECTED Final   Carbapenem resistance VIM NOT DETECTED NOT DETECTED Final    Comment: Performed at [facility], [address] [place], [place] [address]  Culture, blood (Routine X 2) w Reflex to ID Panel     Status: Abnormal   Collection Time: 05/17/20  1:46 PM   Specimen: BLOOD  Result Value Ref Range Status   Specimen Description   Final    BLOOD Performed at [facility], [address], [place], [place] [address]    Special Requests   Final    NONE Performed at [facility],  [place]  [address], [place], [place] [address]    Culture  Setup Time   Final    AEROBIC BOTTLE ONLY GRAM NEGATIVE RODS PATIENT DISCHARGED OR EXPIRED ANAEROBIC BOTTLE GRAM NEGATIVE RODS [facility] RN AT [facility]_0  BY Santiago Glad 10.12.2021   Performed at [facility], [address], [place], [place] [address]    Culture (A)  Final    PROTEUS MIRABILIS SUSCEPTIBILITIES PERFORMED ON PREVIOUS CULTURE WITHIN THE LAST 5 DAYS. Performed at [facility], [place] [address], [place], [place] [address]    Report Status 05/20/2020 FINAL  Final  C Difficile Quick Screen w PCR reflex     Status: None   Collection Time: 05/18/20  4:18 AM  Result Value Ref Range Status   C Diff antigen NEGATIVE NEGATIVE Final   C Diff toxin NEGATIVE NEGATIVE Final   C Diff interpretation No C. difficile detected.  Final    Comment: Performed at [facility], [place] [address] [place], [place] [address]  Gastrointestinal Panel by PCR , Stool     Status: None   Collection Time: 05/18/20  4:18 AM  Result Value Ref Range Status   Campylobacter species NOT DETECTED NOT DETECTED Final   Plesimonas shigelloides NOT DETECTED NOT DETECTED Final   Salmonella species NOT DETECTED NOT DETECTED Final   Yersinia enterocolitica NOT DETECTED NOT DETECTED Final   Vibrio species NOT DETECTED NOT DETECTED Final   Vibrio cholerae NOT DETECTED NOT DETECTED Final   Enteroaggregative E coli (EAEC) NOT DETECTED NOT DETECTED Final   Enteropathogenic E coli (EPEC) NOT DETECTED NOT DETECTED Final   Enterotoxigenic E coli (ETEC) NOT DETECTED NOT DETECTED Final   Shiga like toxin producing E coli (STEC) NOT DETECTED NOT DETECTED Final   Shigella/Enteroinvasive E coli (EIEC) NOT DETECTED NOT DETECTED Final   Cryptosporidium NOT DETECTED NOT DETECTED Final   Cyclospora cayetanensis NOT DETECTED NOT DETECTED Final   Entamoeba histolytica NOT DETECTED NOT DETECTED Final   Giardia lamblia NOT DETECTED NOT DETECTED Final   Adenovirus  F40/41 NOT DETECTED NOT DETECTED Final   Astrovirus NOT DETECTED NOT DETECTED Final   Norovirus GI/GII NOT DETECTED NOT DETECTED Final   Rotavirus A NOT DETECTED NOT DETECTED Final   Sapovirus (I, II, IV, and V) NOT DETECTED NOT DETECTED Final    Comment: Performed at [facility], [address] [place], [place] [address]  Urine Culture     Status: None   Collection Time: 05/18/20  4:18 AM   Specimen: Urine, Clean Catch  Result Value Ref Range Status   Specimen Description   Final    URINE, CLEAN CATCH Performed at [facility], [place] [address] [place], [place] [address]    Special Requests   Final    NONE Performed at [facility], [place] [address] [place], [place] [address]    Culture   Final    NO GROWTH Performed at [facility], [place] [address], [place],  [address]    Report Status 05/19/2020 FINAL  Final  Calprotectin, Fecal     Status: Abnormal   Collection Time: 05/18/20  1:06 PM   Specimen: Stool  Result Value Ref Range Status   Calprotectin, Fecal 725 (H) 0 - 120 ug/g Final    Comment: (NOTE) Concentration     Interpretation   Follow-Up <16 - 50 ug/g     Normal           None >50 -120 ug/g  Borderline       Re-evaluate in 4-6 weeks    >120 ug/g     Abnormal         Repeat as clinically                                   indicated Performed At: [facility] [place], [place] [id number] Rush Farmer MD TL:[id number]          Radiology Studies: No results found.      Scheduled Meds: . azaTHIOprine  50 mg Oral Daily  . budesonide  9 mg Oral Daily  . cephALEXin  500 mg Oral Q6H  . Chlorhexidine Gluconate Cloth  6 each Topical Daily  . heparin  5,000 Units Subcutaneous Q8H  . levothyroxine  37.5 mcg Oral QAC breakfast  . mesalamine  4.8 g Oral Q breakfast  . nystatin  5 mL Oral QID  . ondansetron (ZOFRAN) IV  4 mg Intravenous Q6H  . pantoprazole  40 mg Oral BID AC    Continuous Infusions:    LOS: 6 days    Time spent: 35 minutes    Irine Seal, MD [facility]   To contact the attending provider between 7A-7P or the covering provider during after hours 7P-7A, please log into the web site [email or web address] and access using universal [place] password for that web site. If you do not have the password, please call the hospital operator.  05/23/2020, 11:05 AM

## 2020-05-23 NOTE — Progress Notes (Signed)
PHARMACIST - PHYSICIAN COMMUNICATION  DR:   Grandville Silos  CONCERNING: IV to Oral Route Change Policy  RECOMMENDATION: This patient is receiving Protonix by the intravenous route.  Based on criteria approved by the Pharmacy and Therapeutics Committee, the intravenous medication(s) is/are being converted to the equivalent oral dose form(s).   DESCRIPTION: These criteria include:  The patient is eating (either orally or via tube) and/or has been taking other orally administered medications for a least 24 hours  The patient has no evidence of active gastrointestinal bleeding or impaired GI absorption (gastrectomy, short bowel, patient on TNA or NPO).  If you have questions about this conversion, please contact the Pharmacy Department  []   [phone] )  [name] []   [phone] )  [facility] []   [phone] )  [facility] []   [phone] )  [facility] [x]   [phone] )  [place], PharmD, [facility]: [phone] 05/23/2020 10:58 AM

## 2020-05-24 DIAGNOSIS — R933 Abnormal findings on diagnostic imaging of other parts of digestive tract: Secondary | ICD-10-CM | POA: Diagnosis not present

## 2020-05-24 DIAGNOSIS — N179 Acute kidney failure, unspecified: Secondary | ICD-10-CM | POA: Diagnosis not present

## 2020-05-24 DIAGNOSIS — F419 Anxiety disorder, unspecified: Secondary | ICD-10-CM | POA: Diagnosis not present

## 2020-05-24 DIAGNOSIS — A419 Sepsis, unspecified organism: Secondary | ICD-10-CM | POA: Diagnosis not present

## 2020-05-24 LAB — BASIC METABOLIC PANEL
Anion gap: 10 (ref 5–15)
BUN: 25 mg/dL — ABNORMAL HIGH (ref 8–23)
CO2: 26 mmol/L (ref 22–32)
Calcium: 8.3 mg/dL — ABNORMAL LOW (ref 8.9–10.3)
Chloride: 102 mmol/L (ref 98–111)
Creatinine, Ser: 1.1 mg/dL — ABNORMAL HIGH (ref 0.44–1.00)
GFR, Estimated: 49 mL/min — ABNORMAL LOW (ref >60)
Glucose, Bld: 107 mg/dL — ABNORMAL HIGH (ref 70–99)
Potassium: 3.5 mmol/L (ref 3.5–5.1)
Sodium: 138 mmol/L (ref 135–145)

## 2020-05-24 LAB — CBC
HCT: 28.8 % — ABNORMAL LOW (ref 36.0–46.0)
Hemoglobin: 8.8 g/dL — ABNORMAL LOW (ref 12.0–15.0)
MCH: 23.1 pg — ABNORMAL LOW (ref 26.0–34.0)
MCHC: 30.6 g/dL (ref 30.0–36.0)
MCV: 75.6 fL — ABNORMAL LOW (ref 80.0–100.0)
Platelets: 332 10*3/uL (ref 150–400)
RBC: 3.81 MIL/uL — ABNORMAL LOW (ref 3.87–5.11)
RDW: 18.8 % — ABNORMAL HIGH (ref 11.5–15.5)
WBC: 10.2 10*3/uL (ref 4.0–10.5)
nRBC: 0 % (ref 0.0–0.2)

## 2020-05-24 LAB — MAGNESIUM: Magnesium: 2 mg/dL (ref 1.7–2.4)

## 2020-05-24 MED ORDER — BUDESONIDE 3 MG PO CPEP
9.0000 mg | ORAL_CAPSULE | Freq: Every day | ORAL | 0 refills | Status: DC
Start: 2020-05-25 — End: 2020-12-06

## 2020-05-24 MED ORDER — AMLODIPINE BESYLATE 2.5 MG PO TABS: 2.5000 mg | ORAL_TABLET | Freq: Every day | ORAL | Status: DC | PRN

## 2020-05-24 MED ORDER — LEVOTHYROXINE SODIUM 75 MCG PO TABS: 37.5000 ug | ORAL_TABLET | Freq: Every day | ORAL | 1 refills | Status: DC

## 2020-05-24 MED ORDER — MESALAMINE 1.2 G PO TBEC
4.8000 g | DELAYED_RELEASE_TABLET | Freq: Every day | ORAL | 0 refills | Status: DC
Start: 2020-05-24 — End: 2020-05-27

## 2020-05-24 MED ORDER — MAGIC MOUTHWASH W/LIDOCAINE: 15.0000 mL | Freq: Four times a day (QID) | ORAL | 0 refills | Status: AC

## 2020-05-24 MED ORDER — OXYCODONE-ACETAMINOPHEN 5-325 MG PO TABS
1.0000 | ORAL_TABLET | ORAL | 0 refills | Status: DC | PRN
Start: 2020-05-24 — End: 2020-06-09

## 2020-05-24 MED ORDER — NYSTATIN 100000 UNIT/ML MT SUSP: 5.0000 mL | Freq: Four times a day (QID) | OROMUCOSAL | 0 refills | Status: AC

## 2020-05-24 MED ORDER — PANTOPRAZOLE SODIUM 40 MG PO TBEC: 40.0000 mg | DELAYED_RELEASE_TABLET | Freq: Two times a day (BID) | ORAL | 1 refills | Status: AC

## 2020-05-24 MED ORDER — POTASSIUM CHLORIDE CRYS ER 20 MEQ PO TBCR
40.0000 meq | EXTENDED_RELEASE_TABLET | Freq: Once | ORAL | Status: AC
  Administered 2020-05-24: 40 meq via ORAL
  Filled 2020-05-24: qty 2

## 2020-05-24 MED ORDER — VALACYCLOVIR HCL 1 G PO TABS: 1000.0000 mg | ORAL_TABLET | Freq: Two times a day (BID) | ORAL | 0 refills | Status: AC

## 2020-05-24 MED ORDER — CEPHALEXIN 500 MG PO CAPS: 500.0000 mg | ORAL_CAPSULE | Freq: Four times a day (QID) | ORAL | 0 refills | Status: AC

## 2020-05-24 NOTE — Care Management Important Message (Signed)
Important Message  Patient Details IM Letter given to the Patient Name: Rhonda Bailey MRN: [id number] Date of Birth: 1946-01-07   Medicare Important Message Given:  Yes     Kerin Salen 05/24/2020, 11:19 AM

## 2020-05-24 NOTE — Discharge Summary (Signed)
Physician Discharge Summary  Rhonda Bailey:[id number] DOB: 1946/03/04 DOA: 05/17/2020  PCP: [name], MD  Admit date: 05/17/2020 Discharge date: 05/24/2020  Time spent: 60 minutes  Recommendations for Outpatient Follow-up:  1. Patient was discharged home with home health therapies. 2. Follow-up with Tye Savoy, NP gastroenterology as scheduled 05/31/2020 at 9 AM. 3. Follow-up with Koirala, Dibas, MD in 2 to 3 weeks.  On follow-up patient will need a basic metabolic profile done to follow-up on electrolytes and renal function, magnesium level checked, CBC done to follow-up on H&H.  Patient will need repeat thyroid function studies done in about 4 to 6 weeks. 4. Follow-up with Dr. [name], urology in 2 to 3 weeks.  Patient is to call in 1 week for outpatient appointment time.   Discharge Diagnoses:  Principal Problem:   Sepsis ([place]) Active Problems:   Hypoxia   Bacterial infection due to Proteus mirabilis   AKI (acute kidney injury) ([place])   Diarrhea   IBD (inflammatory bowel disease)   Immunosuppression ([facility])   Bacteremia   Abnormal CT scan, gastrointestinal tract   Acute renal failure ([place])   Anxiety   Sepsis due to gram-negative urinary tract infection ([place])   Hypervolemia   Discharge Condition: Stable and improved  Diet recommendation: Soft diet/heart healthy  Filed Weights   05/21/20 0950 05/23/20 0500 05/24/20 0526  Weight: 78.2 kg 77.5 kg 77.1 kg    History of present illness:  HPI per Dr. Barbaraann Faster Rhonda Bailey is a 74 y.o. female with medical history of inflammatory bowel disease initially diagnosed via colonoscopy on 12/04/2019.  The patient also has history of hypothyroidism, anxiety, and iron deficiency anemia.  The patient had been receiving Humira under the guidance of Dr. [name].  In addition, the patient had been on Lialda and azathioprine as well as a prednisone taper for her inflammatory bowel disease.  She was down to prednisone 5 mg daily  with which she was due to finish in 3 days.  She presented with 1 week history of nausea, vomiting, and diarrhea.  The patient had been having 8 loose stools on a daily basis without any hematochezia.  She had been having some black stools, but she is taking iron.  Because of her vomiting, the patient stated that she has not been able to keep down her medications for about 48 hours prior to this admission.  She has had increasing generalized weakness and difficulty getting out of bed today.  As a result EMS was activated.  EMS noted the patient to have a systolic blood pressure in the 70s.  The patient denied any frank fevers, chills, headache, neck pain, chest pain, shortness breath, coughing, hemoptysis, hematemesis, dysuria, hematuria.  She did complaint of bilateral flank pain that had been present for about 5 days.  She had a C. difficile assay performed on 04/30/2020 which was negative. In the emergency department, the patient was afebrile with hypotension initially 81/68.  She was started on IV fluids with some improvement of her blood pressure.  BMP showed a serum creatinine of 4.33 which is above her usual baseline.  LFTs were unremarkable.  WBC 36.3, hemoglobin 10.8, platelets 215,000.  Hospitalist was contacted for admission for further evaluation and treatment.  She has been fully vaccinated for COVID with Moderna.  Hospital Course:  1 severe sepsis secondary to obstructive urethral stone and Proteus bacteremia, POA Patient on admission met criteria for sepsis with tachycardia, significant leukocytosis, hypotension which was responsive to IV fluids  and IV antibiotics, acute renal failure.  CT abdomen and pelvis done concerning for obstructive left ureteral stone with hydroureteronephrosis and wall thickening involving much of the colon concerning for infectious versus inflammatory colitis.  Patient was seen in consultation by urology and patient status post left ureteral stent placement 05/17/2020  per Dr. [name].  Blood cultures positive for Proteus Mirabilis which was pansensitive.  Patient with improvement with leukocytosis.  Patient remained afebrile.  Patient noted to have a low-grade temp on 05/20/2020, felt terrible with ongoing bilateral CVA tenderness and as such repeat CT abdomen and pelvis was obtained which showed new stranding and subcutaneous fat of the left abdomen due to dependent change, trace bilateral pleural effusions and dependent atelectasis have mildly increased since prior CT, resolved left hydronephrosis with good stent placement, 3-4 punctate left renal stone seen again, improvement of inflammatory change about descending colon, fatty infiltration of the liver.  Patient was reassessed by urology and recommended continue current regimen of antibiotics and no further intervention needed at this time.  Urology recommended outpatient follow-up in 2 to 3 weeks to have definitive stone removal surgery scheduled.  IV antibiotics were narrowed to IV Ancef.   IV Ancef subsequently narrowed to oral Keflex which patient will be discharged home to complete a 2-week course of antibiotic treatment from day of stent placement.  Foley catheter was discontinued and patient passed a voiding trial.  Outpatient follow-up with urology, Dr. [name] in 2 to 3 weeks.   2.  Hypoxia secondary to volume overload Patient noted to be hypoxic the evening of 05/19/2020, with some lethargy per RN, patient with sats of 93% on 5 L nasal cannula.  Patient noted to be volume overloaded on examination secondary to aggressive fluid resuscitation on presentation.  Patient placed on IV Lasix with good urine output and patient was -3.247 L during this hospitalization.  Chest x-ray initially done was concerning for pulmonary congestion.  Patient diuresed adequately, hypoxia improved and patient with sats of 94% on room air by day of discharge.  Patient will be discharged in stable and improved condition.    3.   Inflammatory bowel disease CT abdomen and pelvis revealed colonic wall thickening as well as diverticulosis.  Patient presented with nausea vomiting and diarrhea.  Nausea vomiting improved however still with ongoing diarrhea which slowly improved during the hospitalization. Patient noted to be on Humira, azathioprine, Lialda, steroid taper in the outpatient setting.  C. difficile PCR which was done was negative.  Fecal calprotectin elevated at 725.  GI consulted and followed the patient throughout the hospitalization, and due to elevated calprotectin, concern for inflammatory component to her diarrhea in the setting of IBD.  Mesalamine increased to 4.8 g daily per GI recommendations. Patient started on Entocort. GI holding off on steroid treatment or resumption of Humira at this time due to active infection.    Patient also maintained on home regimen azathioprine.  Humira level and antibody test pending by day of discharge.  Patient was placed on a clear liquid diet and diet advanced to a soft diet which he tolerated.  Outpatient follow-up with GI as scheduled on 05/31/2020.  4.  Acute renal failure/metabolic acidosis Secondary to problem #1/post renal azotemia secondary to obstructing ureteral stone.  Status post urethral stent placement. Patient status post IV Lasix which has subsequently been discontinued.  Patient had good urine output throughout the hospitalization and creatinine was down to 1.10 by day of discharge from 4.33 on day of admission.  Outpatient  follow-up with urology and PCP.    5.  Hypothyroidism Patient maintained on Synthroid and dose adjusted to 37.5 MCG's daily.  Will need repeat thyroid function studies done in about 4 to 6 weeks.  Outpatient follow-up with PCP.   6.  Anxiety Patient maintained on home regimen of trazodone, alprazolam.  7.  Anemia of chronic disease/iron deficiency anemia Likely dilutional in nature.  Patient with no overt bleeding.  Hemoglobin stabilized at  8.8 by day of discharge.  Anemia panel consistent with iron deficiency anemia/anemia of chronic disease. FOBT done was positive. Transfusion threshold hemoglobin <7. GI follow the patient throughout the hospitalization.  Patient's oral iron supplementation will be resumed on discharge.  Outpatient follow-up with GI.   8.  Hypokalemia/hypomagnesemia Likely secondary to GI losses and diuretics.  Electrolytes were repleted.  Outpatient follow-up.   Procedures:  CT abdomen and pelvis 05/17/2020, 05/20/2020  Chest x-ray 05/17/2020  Cystoscopy/left urethral stent placement/left retrograde pyelography with interpretation per Dr. [name], urology 05/17/2020  Consultations:  Gastroenterology: Dr. Hilarie Fredrickson 05/18/2020  Urology: Dr. [name] 05/17/2020    Discharge Exam: Vitals:   05/24/20 0608 05/24/20 1503  BP: 121/60 (!) 112/56  Pulse: 81 92  Resp: 16 18  Temp: 99.2 F (37.3 C) (!) 97.5 F (36.4 C)  SpO2: 93% 94%    General: NAD Cardiovascular: RRR Respiratory: CTAB  Discharge Instructions   Discharge Instructions    Diet - low sodium heart healthy   Complete by: As directed    Increase activity slowly   Complete by: As directed      Allergies as of 05/24/2020      Reactions   Tramadol    Says "blew my head up."      Medication List    STOP taking these medications   AneCream5 5 % Crea Generic drug: Lidocaine (Anorectal)   Humira Pen 40 MG/0.4ML Pnkt Generic drug: Adalimumab     TAKE these medications   acidophilus Caps capsule Take 1 capsule by mouth daily.   ALPRAZolam 0.5 MG tablet Commonly known as: XANAX Take 0.5 mg by mouth at bedtime as needed for sleep.   amLODipine 2.5 MG tablet Commonly known as: NORVASC Take 1 tablet (2.5 mg total) by mouth daily as needed (high BP). Start taking on: May 27, 2020 What changed: These instructions start on May 27, 2020. If you are unsure what to do until then, ask your doctor or other care provider.    azaTHIOprine 50 MG tablet Commonly known as: IMURAN Take 1 tablet (50 mg total) by mouth daily.   budesonide 3 MG 24 hr capsule Commonly known as: ENTOCORT EC Take 3 capsules (9 mg total) by mouth daily. Start taking on: May 25, 2020   cephALEXin 500 MG capsule Commonly known as: KEFLEX Take 1 capsule (500 mg total) by mouth every 6 (six) hours for 8 days.   dicyclomine 10 MG capsule Commonly known as: BENTYL Take 1 capsule (10 mg total) by mouth 4 (four) times daily -  before meals and at bedtime.   ferrous gluconate 324 MG tablet Commonly known as: FERGON TAKE 1 TABLET BY MOUTH DAILY WITH BREAKFAST   HYDROCORTISONE ACE (RECTAL) 30 MG Supp Place 1 suppository (30 mg total) rectally in the morning and at bedtime.   levothyroxine 75 MCG tablet Commonly known as: SYNTHROID Take 0.5 tablets (37.5 mcg total) by mouth daily before breakfast. Start taking on: May 25, 2020 What changed:   medication strength  how much to take  magic mouthwash w/lidocaine Soln Take 15 mLs by mouth 4 (four) times daily for 7 days.   mesalamine 1.2 g EC tablet Commonly known as: LIALDA Take 4 tablets (4.8 g total) by mouth daily with breakfast. What changed: See the new instructions.   nystatin 100000 UNIT/ML suspension Commonly known as: MYCOSTATIN Take 5 mLs (500,000 Units total) by mouth 4 (four) times daily for 7 days.   ondansetron 8 MG disintegrating tablet Commonly known as: Zofran ODT Take 1 tablet (8 mg total) by mouth every 8 (eight) hours as needed for nausea or vomiting.   oxyCODONE-acetaminophen 5-325 MG tablet Commonly known as: PERCOCET/ROXICET Take 1 tablet by mouth every 4 (four) hours as needed for moderate pain or severe pain.   pantoprazole 40 MG tablet Commonly known as: PROTONIX Take 1 tablet (40 mg total) by mouth 2 (two) times daily before a meal. What changed: when to take this   traZODone 100 MG tablet Commonly known as: DESYREL Take 50-100 mg by  mouth at bedtime as needed for sleep.   valACYclovir 1000 MG tablet Commonly known as: VALTREX Take 1 tablet (1,000 mg total) by mouth 2 (two) times daily for 6 days.      Allergies  Allergen Reactions  . Tramadol     Says "blew my head up."    Follow-up Information    McKenzie, Candee Furbish, MD. Call in 1 week(s).   Specialty: Urology Contact information: [address]  [place] [address] [phone]        Care, [facility] Follow up.   Specialty: [place] Why: [facility] physical therapy/occupational therapy Contact information: [place] [address] [phone]        Koirala, Dibas, MD. Schedule an appointment as soon as possible for a visit in 3 week(s).   Specialty: Family Medicine Why: F/U IN 2-3 WEEKS. Contact information: [place] [address] [phone]        Willia Craze, NP Follow up on 05/31/2020.   Specialty: Gastroenterology Why: F/U AT [facility] information: [place] [address] [phone]                The results of significant diagnostics from this hospitalization (including imaging, microbiology, ancillary and laboratory) are listed below for reference.    Significant Diagnostic Studies: CT ABDOMEN PELVIS WO CONTRAST  Result Date: 05/20/2020 CLINICAL DATA:  Patient diagnosed with inflammatory bowel disease by colonoscopy 12/04/2019. The patient was also diagnosed with a left ureteral stone 05/17/2020. Nausea, vomiting and diarrhea for a little over 1 week. EXAM: CT ABDOMEN AND PELVIS WITHOUT CONTRAST TECHNIQUE: Multidetector CT imaging of the abdomen and pelvis was performed following the standard protocol without IV contrast. COMPARISON:  CT abdomen and pelvis 05/17/2020. FINDINGS: Lower chest: Trace bilateral pleural effusions and mild dependent atelectasis, slightly increased since the prior CT. Hepatobiliary:  Status post cholecystectomy. A few small calcifications in the liver are again seen. The liver is low attenuating consistent with fatty infiltration. Biliary tree is negative. Pancreas: Fatty replacement again seen.  Otherwise negative. Spleen: Normal in size without focal abnormality. Adrenals/Urinary Tract: Since the prior examination, the patient has undergone placement of a left ureteral stent which is in good position. Previously seen left ureteral stone is no longer identified and hydronephrosis has resolved. 3-4 punctate left renal stones are seen. There are no right renal stones or hydronephrosis. Atrophy of the right kidney relative to  the left noted. Right ureter appears normal. The urinary bladder is decompressed with a Foley catheter in place. Likely left adrenal adenoma noted. The right adrenal gland is unremarkable Stomach/Bowel: Inflammatory change of the descending colon persists but appear mildly improved. Scattered diverticular disease is noted. No pneumatosis or portal venous gas. Small hiatal hernia again seen. The stomach and small bowel are unremarkable. The appendix is normal in appearance. Vascular/Lymphatic: Aortic atherosclerosis. No enlarged abdominal or pelvic lymph nodes. Reproductive: Uterus and bilateral adnexa are unremarkable. Other: Tiny fat containing umbilical hernia noted. There is new stranding in subcutaneous fat along the left side of the abdomen. Musculoskeletal: No acute abnormality. L1 and L2 compression fractures seen on the prior study noted. Advanced bilateral hip osteoarthritis is also present. IMPRESSION: There is new stranding in subcutaneous fat of the left abdomen which may be due to dependent change but is nonspecific. Trace bilateral pleural effusions and dependent atelectasis have mildly increased since the prior CT. Resolved left hydronephrosis with a new double-J ureteral stone in good position. 3-4 punctate left renal stones are again seen. Some improvement of  inflammatory change about the descending colon. Fatty infiltration of the liver. Aortic Atherosclerosis (ICD10-I70.0). Electronically Signed   By: Inge Rise M.D.   On: 05/20/2020 15:11   CT Abdomen Pelvis Wo Contrast  Result Date: 05/17/2020 CLINICAL DATA:  Weakness, vomiting, diarrhea EXAM: CT ABDOMEN AND PELVIS WITHOUT CONTRAST TECHNIQUE: Multidetector CT imaging of the abdomen and pelvis was performed following the standard protocol without IV contrast. COMPARISON:  10/13/2019 FINDINGS: Lower chest: Bibasilar atelectasis/scarring. Hepatobiliary: Few scattered calcified granulomas liver. Cholecystectomy. No unexpected biliary dilatation. Pancreas: Unremarkable apart from fatty replacement. Spleen: Unremarkable. Adrenals/Urinary Tract: Stable 2 cm lesion of the left adrenal with stability since 2020 suggesting an adenoma. Right adrenal is unremarkable. Right kidney is unremarkable. There is mild left hydronephrosis and proximal hydroureter. Obstructing calculus or calculi present within the mid left ureter measuring 2 mm axially and 2.5 cm craniocaudally. Bladder is unremarkable. Stomach/Bowel: Stomach is within normal limits. Bowel is normal in caliber. There is distal colonic diverticulosis. Wall thickening involving much of the colon. Vascular/Lymphatic: Mild aortic atherosclerosis. No enlarged lymph nodes identified. Reproductive: Uterus and bilateral adnexa are unremarkable. Other: No ascites.  Abdominal wall is unremarkable. Musculoskeletal: New but chronic appearing compression deformity of L1 and L2. Degenerative changes of the included spine and hips. IMPRESSION: Obstructing calculus or adjacent calculi within the mid left ureter with mild proximal hydroureteronephrosis. Wall thickening involving much of the colon, which could be on the basis of underdistention or reflect infectious/inflammatory colitis given provided history. Distal colonic diverticulosis. Electronically Signed   By: Macy Mis M.D.   On: 05/17/2020 14:40   DG CHEST PORT 1 VIEW  Result Date: 05/19/2020 CLINICAL DATA:  Hypoxia EXAM: PORTABLE CHEST 1 VIEW COMPARISON:  05/17/2020 FINDINGS: Cardiac shadow is stable. Aortic calcifications are seen. Mild vascular congestion is seen without edema. No focal infiltrate is seen. No pneumothorax is noted. IMPRESSION: Mild central vascular congestion without interstitial edema. Electronically Signed   By: Inez Catalina M.D.   On: 05/19/2020 22:23   DG CHEST PORT 1 VIEW  Result Date: 05/17/2020 CLINICAL DATA:  Weakness. EXAM: PORTABLE CHEST 1 VIEW COMPARISON:  October 14, 2019. FINDINGS: The heart size and mediastinal contours are within normal limits. Both lungs are clear. The visualized skeletal structures are unremarkable. IMPRESSION: No active disease. Electronically Signed   By: Marijo Conception M.D.   On: 05/17/2020 13:00   DG  C-Arm 1-60 Min-No Report  Result Date: 05/17/2020 Fluoroscopy was utilized by the requesting physician.  No radiographic interpretation.    Microbiology: Recent Results (from the past 240 hour(s))  Respiratory Panel by RT PCR (Flu A&B, Covid) - Nasopharyngeal Swab     Status: None   Collection Time: 05/17/20 11:21 AM   Specimen: Nasopharyngeal Swab  Result Value Ref Range Status   SARS Coronavirus 2 by RT PCR NEGATIVE NEGATIVE Final    Comment: (NOTE) SARS-CoV-2 target nucleic acids are NOT DETECTED.  The SARS-CoV-2 RNA is generally detectable in upper respiratoy specimens during the acute phase of infection. The lowest concentration of SARS-CoV-2 viral copies this assay can detect is 131 copies/mL. A negative result does not preclude SARS-Cov-2 infection and should not be used as the sole basis for treatment or other patient management decisions. A negative result may occur with  improper specimen collection/handling, submission of specimen other than nasopharyngeal swab, presence of viral mutation(s) within the areas targeted by this  assay, and inadequate number of viral copies (<131 copies/mL). A negative result must be combined with clinical observations, patient history, and epidemiological information. The expected result is Negative.  Fact Sheet for Patients:  PinkCheek.be  Fact Sheet for Healthcare Providers:  GravelBags.it  This test is no t yet approved or cleared by the [place] FDA and  has been authorized for detection and/or diagnosis of SARS-CoV-2 by FDA under an Emergency Use Authorization (EUA). This EUA will remain  in effect (meaning this test can be used) for the duration of the COVID-19 declaration under Section 564(b)(1) of the Act, 21 U.S.C. section 360bbb-3(b)(1), unless the authorization is terminated or revoked sooner.     Influenza A by PCR NEGATIVE NEGATIVE Final   Influenza B by PCR NEGATIVE NEGATIVE Final    Comment: (NOTE) The Xpert Xpress SARS-CoV-2/FLU/RSV assay is intended as an aid in  the diagnosis of influenza from Nasopharyngeal swab specimens and  should not be used as a sole basis for treatment. Nasal washings and  aspirates are unacceptable for Xpert Xpress SARS-CoV-2/FLU/RSV  testing.  Fact Sheet for Patients: PinkCheek.be  Fact Sheet for Healthcare Providers: GravelBags.it  This test is not yet approved or cleared by the [place] FDA and  has been authorized for detection and/or diagnosis of SARS-CoV-2 by  FDA under an Emergency Use Authorization (EUA). This EUA will remain  in effect (meaning this test can be used) for the duration of the  Covid-19 declaration under Section 564(b)(1) of the Act, 21  U.S.C. section 360bbb-3(b)(1), unless the authorization is  terminated or revoked. Performed at [facility], [address], [place], [place] [address]   Culture, blood (Routine X 2) w Reflex to ID Panel     Status: Abnormal   Collection Time:  05/17/20 12:48 PM   Specimen: BLOOD  Result Value Ref Range Status   Specimen Description   Final    BLOOD RIGHT ANTECUBITAL Performed at [facility], [address], [place], [place] [address]    Special Requests   Final    BOTTLES DRAWN AEROBIC AND ANAEROBIC Blood Culture results may not be optimal due to an inadequate volume of blood received in culture bottles Performed at [facility], [address], [place], [place] [address]    Culture  Setup Time   Final    IN BOTH AEROBIC AND ANAEROBIC BOTTLES GRAM NEGATIVE RODS PATIENT DISCHARGED OR EXPIRED Gram Stain Report Called to,Read Back By and Verified With: ONAEGBUAM,I RN @ [facility] @  0730 05/18/2020 BY MATTHEWS, B     Culture PROTEUS MIRABILIS (A)  Final   Report Status 05/20/2020 FINAL  Final   Organism ID, Bacteria PROTEUS MIRABILIS  Final      Susceptibility   Proteus mirabilis - MIC*    AMPICILLIN <=2 SENSITIVE Sensitive     CEFAZOLIN <=4 SENSITIVE Sensitive     CEFEPIME <=0.12 SENSITIVE Sensitive     CEFTAZIDIME <=1 SENSITIVE Sensitive     CEFTRIAXONE <=0.25 SENSITIVE Sensitive     CIPROFLOXACIN <=0.25 SENSITIVE Sensitive     GENTAMICIN <=1 SENSITIVE Sensitive     IMIPENEM 2 SENSITIVE Sensitive     TRIMETH/SULFA <=20 SENSITIVE Sensitive     AMPICILLIN/SULBACTAM <=2 SENSITIVE Sensitive     PIP/TAZO <=4 SENSITIVE Sensitive     * PROTEUS MIRABILIS  Blood Culture ID Panel (Reflexed)     Status: Abnormal   Collection Time: 05/17/20 12:48 PM  Result Value Ref Range Status   Enterococcus faecalis NOT DETECTED NOT DETECTED Final   Enterococcus Faecium NOT DETECTED NOT DETECTED Final   Listeria monocytogenes NOT DETECTED NOT DETECTED Final   Staphylococcus species NOT DETECTED NOT DETECTED Final   Staphylococcus aureus (BCID) NOT DETECTED NOT DETECTED Final   Staphylococcus epidermidis NOT DETECTED NOT DETECTED Final   Staphylococcus lugdunensis NOT DETECTED NOT DETECTED Final   Streptococcus species NOT DETECTED NOT DETECTED  Final   Streptococcus agalactiae NOT DETECTED NOT DETECTED Final   Streptococcus pneumoniae NOT DETECTED NOT DETECTED Final   Streptococcus pyogenes NOT DETECTED NOT DETECTED Final   A.calcoaceticus-baumannii NOT DETECTED NOT DETECTED Final   Bacteroides fragilis NOT DETECTED NOT DETECTED Final   Enterobacterales DETECTED (A) NOT DETECTED Final    Comment: Enterobacterales represent a large order of gram negative bacteria, not a single organism. CRITICAL RESULT CALLED TO, READ BACK BY AND VERIFIED WITH: Guadlupe Spanish PharmD 10:30 05/18/20 ([email or web address])    Enterobacter cloacae complex NOT DETECTED NOT DETECTED Final   Escherichia coli NOT DETECTED NOT DETECTED Final   Klebsiella aerogenes NOT DETECTED NOT DETECTED Final   Klebsiella oxytoca NOT DETECTED NOT DETECTED Final   Klebsiella pneumoniae NOT DETECTED NOT DETECTED Final   Proteus species DETECTED (A) NOT DETECTED Final    Comment: CRITICAL RESULT CALLED TO, READ BACK BY AND VERIFIED WITH: Guadlupe Spanish PharmD 10:30 05/18/20 ([email or web address])    Salmonella species NOT DETECTED NOT DETECTED Final   Serratia marcescens NOT DETECTED NOT DETECTED Final   Haemophilus influenzae NOT DETECTED NOT DETECTED Final   Neisseria meningitidis NOT DETECTED NOT DETECTED Final   Pseudomonas aeruginosa NOT DETECTED NOT DETECTED Final   Stenotrophomonas maltophilia NOT DETECTED NOT DETECTED Final   Candida albicans NOT DETECTED NOT DETECTED Final   Candida auris NOT DETECTED NOT DETECTED Final   Candida glabrata NOT DETECTED NOT DETECTED Final   Candida krusei NOT DETECTED NOT DETECTED Final   Candida parapsilosis NOT DETECTED NOT DETECTED Final   Candida tropicalis NOT DETECTED NOT DETECTED Final   Cryptococcus neoformans/gattii NOT DETECTED NOT DETECTED Final   CTX-M ESBL NOT DETECTED NOT DETECTED Final   Carbapenem resistance IMP NOT DETECTED NOT DETECTED Final   Carbapenem resistance KPC NOT DETECTED NOT DETECTED Final   Carbapenem resistance NDM NOT  DETECTED NOT DETECTED Final   Carbapenem resist OXA 48 LIKE NOT DETECTED NOT DETECTED Final   Carbapenem resistance VIM NOT DETECTED NOT DETECTED Final    Comment: Performed at [facility], [address], [place], [place] [address]  Culture, blood (Routine X  2) w Reflex to ID Panel     Status: Abnormal   Collection Time: 05/17/20  1:46 PM   Specimen: BLOOD  Result Value Ref Range Status   Specimen Description   Final    BLOOD Performed at [facility], [address], [place], [place] [address]    Special Requests   Final    NONE Performed at [facility], [address] [place], [place] [address]    Culture  Setup Time   Final    AEROBIC BOTTLE ONLY GRAM NEGATIVE RODS PATIENT DISCHARGED OR EXPIRED ANAEROBIC BOTTLE GRAM NEGATIVE RODS [facility] RN AT [facility]_0  BY Santiago Glad 10.12.2021   Performed at [facility], [address] [place], [place] [address]    Culture (A)  Final    PROTEUS MIRABILIS SUSCEPTIBILITIES PERFORMED ON PREVIOUS CULTURE WITHIN THE LAST 5 DAYS. Performed at [facility], [place] [address], [place], [place] [address]    Report Status 05/20/2020 FINAL  Final  C Difficile Quick Screen w PCR reflex     Status: None   Collection Time: 05/18/20  4:18 AM  Result Value Ref Range Status   C Diff antigen NEGATIVE NEGATIVE Final   C Diff toxin NEGATIVE NEGATIVE Final   C Diff interpretation No C. difficile detected.  Final    Comment: Performed at [facility], [place] [address] [place], [place] [address]  Gastrointestinal Panel by PCR , Stool     Status: None   Collection Time: 05/18/20  4:18 AM  Result Value Ref Range Status   Campylobacter species NOT DETECTED NOT DETECTED Final   Plesimonas shigelloides NOT DETECTED NOT DETECTED Final   Salmonella species NOT DETECTED NOT DETECTED Final   Yersinia enterocolitica NOT DETECTED NOT DETECTED Final   Vibrio species NOT DETECTED NOT DETECTED Final   Vibrio cholerae NOT DETECTED NOT  DETECTED Final   Enteroaggregative E coli (EAEC) NOT DETECTED NOT DETECTED Final   Enteropathogenic E coli (EPEC) NOT DETECTED NOT DETECTED Final   Enterotoxigenic E coli (ETEC) NOT DETECTED NOT DETECTED Final   Shiga like toxin producing E coli (STEC) NOT DETECTED NOT DETECTED Final   Shigella/Enteroinvasive E coli (EIEC) NOT DETECTED NOT DETECTED Final   Cryptosporidium NOT DETECTED NOT DETECTED Final   Cyclospora cayetanensis NOT DETECTED NOT DETECTED Final   Entamoeba histolytica NOT DETECTED NOT DETECTED Final   Giardia lamblia NOT DETECTED NOT DETECTED Final   Adenovirus F40/41 NOT DETECTED NOT DETECTED Final   Astrovirus NOT DETECTED NOT DETECTED Final   Norovirus GI/GII NOT DETECTED NOT DETECTED Final   Rotavirus A NOT DETECTED NOT DETECTED Final   Sapovirus (I, II, IV, and V) NOT DETECTED NOT DETECTED Final    Comment: Performed at [facility], [address] [place], [place] [address]  Urine Culture     Status: None   Collection Time: 05/18/20  4:18 AM   Specimen: Urine, Clean Catch  Result Value Ref Range Status   Specimen Description   Final    URINE, CLEAN CATCH Performed at [facility], [place] [address] [place], [place] [address]    Special Requests   Final    NONE Performed at [facility], [place] [address] [place], [place] [address]    Culture   Final    NO GROWTH Performed at [facility], [place] [address] [place], [place] [address]    Report Status 05/19/2020 FINAL  Final  Calprotectin, Fecal  Status: Abnormal   Collection Time: 05/18/20  1:06 PM   Specimen: Stool  Result Value Ref Range Status   Calprotectin, Fecal 725 (H) 0 - 120 ug/g Final    Comment: (NOTE) Concentration     Interpretation   Follow-Up <16 - 50 ug/g     Normal           None >50 -120 ug/g     Borderline       Re-evaluate in 4-6 weeks    >120 ug/g     Abnormal         Repeat as clinically                                    indicated Performed At: [facility] [place], [place] [id number] Rush Farmer MD PQ:[id number]      Labs: Basic Metabolic Panel: Recent Labs  Lab 05/20/20 [id number] 05/21/20 0442 05/22/20 0740 05/23/20 0637 05/24/20 0517  NA 143 137 140 141 138  K 3.4* 3.1* 3.3* 4.5 3.5  CL 103 93* 99 102 102  CO2 24 33* _0 GLUCOSE 94 105* 109* 147* 107*  BUN _1 28* 25*  CREATININE 1.34* 1.51* 1.41* 1.37* 1.10*  CALCIUM 8.3* 8.1* 8.0* 8.7* 8.3*  MG 1.5* 2.4 2.1 2.4 2.0  PHOS 2.7 3.3  --   --   --    Liver Function Tests: Recent Labs  Lab 05/20/20 0638 05/21/20 0442  ALBUMIN 2.5* 2.3*   No results for input(s): LIPASE, AMYLASE in the last 168 hours. No results for input(s): AMMONIA in the last 168 hours. CBC: Recent Labs  Lab 05/20/20 0638 05/21/20 0442 05/22/20 0740 05/23/20 0637 05/24/20 0517  WBC 11.7* 11.9* 10.7* 8.9 10.2  NEUTROABS 9.8* 9.2* 8.5* 7.1  --   HGB 10.6* 9.6* 8.5* 9.4* 8.8*  HCT 34.2* 31.5* 28.2* 31.8* 28.8*  MCV 74.8* 75.9* 76.2* 77.2* 75.6*  PLT 99* 95* 139* 244 332   Cardiac Enzymes: No results for input(s): CKTOTAL, CKMB, CKMBINDEX, TROPONINI in the last 168 hours. BNP: BNP (last 3 results) Recent Labs    05/20/20 0834  BNP 593.7*    ProBNP (last 3 results) No results for input(s): PROBNP in the last 8760 hours.  CBG: No results for input(s): GLUCAP in the last 168 hours.     Signed:  Irine Seal MD.  [facility] 05/24/2020, 3:21 PM

## 2020-05-24 NOTE — Progress Notes (Addendum)
Physical Therapy Treatment Patient Details Name: Rhonda Bailey MRN: [id number] DOB: Sep 25, 1945 Today's Date: 05/24/2020    History of Present Illness 74 y.o. female with medical history of inflammatory bowel disease initially diagnosed via colonoscopy on 12/04/2019.  The patient also has history of hypothyroidism, anxiety, and iron deficiency anemia.  admitted with nausea, vomiting, and diarrhea. went into respiratory distress d/t fluid overload requiring 5L O2, down to 2L at time of PT eval    PT Comments    Progressing well with mobility.  O2 95% on RA during session.   Follow Up Recommendations  Home health PT     Equipment Recommendations  None recommended by PT    Recommendations for Other Services       Precautions / Restrictions Precautions Precautions: Fall Precaution Comments: monitor O2 (did well today on RA 92-96%) Restrictions Weight Bearing Restrictions: No    Mobility  Bed Mobility Overal bed mobility: Independent                Transfers Overall transfer level: Needs assistance Equipment used: Rolling walker (2 wheeled) Transfers: Sit to/from Stand Sit to Stand: Supervision         General transfer comment: cues for hand placement  Ambulation/Gait Ambulation/Gait assistance: Min guard Gait Distance (Feet): 150 Feet Assistive device: Rolling walker (2 wheeled);None Gait Pattern/deviations: Step-through pattern;Decreased stride length     General Gait Details: Pt walked ~100 feet with RW then ~50 feet without a device. No overt LOB but mildly unsteady without UE support.   Stairs             Wheelchair Mobility    Modified Rankin (Stroke Patients Only)       Balance Overall balance assessment: Mild deficits observed, not formally tested                                          Cognition Arousal/Alertness: Awake/alert Behavior During Therapy: WFL for tasks assessed/performed Overall Cognitive Status:  Within Functional Limits for tasks assessed                                        Exercises      General Comments        Pertinent Vitals/Pain Pain Assessment: Faces Pain Score: 2  Faces Pain Scale: Hurts little more Pain Location: back Pain Descriptors / Indicators: Sore Pain Intervention(s): Monitored during session    Home Living                      Prior Function            PT Goals (current goals can now be found in the care plan section) Progress towards PT goals: Progressing toward goals    Frequency    Min 3X/week      PT Plan Current plan remains appropriate    Co-evaluation   Reason for Co-Treatment: To address functional/ADL transfers PT goals addressed during session: Mobility/safety with mobility;Strengthening/ROM OT goals addressed during session: ADL's and self-care      AM-PAC PT "6 Clicks" Mobility   Outcome Measure  Help needed turning from your back to your side while in a flat bed without using bedrails?: None Help needed moving from lying on your back to sitting on the side  of a flat bed without using bedrails?: None Help needed moving to and from a bed to a chair (including a wheelchair)?: A Little Help needed standing up from a chair using your arms (e.g., wheelchair or bedside chair)?: A Little Help needed to walk in hospital room?: A Little Help needed climbing 3-5 steps with a railing? : A Little 6 Click Score: 20    End of Session Equipment Utilized During Treatment: Gait belt Activity Tolerance: Patient tolerated treatment well Patient left:  (in bathroom with OT)   PT Visit Diagnosis: Difficulty in walking, not elsewhere classified (R26.2)     Time: 2876-8115 PT Time Calculation (min) (ACUTE ONLY): 17 min  Charges:  $Gait Training: 8-22 mins                        Doreatha Massed, PT Acute Rehabilitation  Office: [phone] Pager: [phone]

## 2020-05-24 NOTE — Progress Notes (Signed)
Pt given discharge instructions and all questions answered. Prescriptions also given to patient. Patient discharged home via private vehicle.

## 2020-05-24 NOTE — TOC Transition Note (Signed)
Transition of Care [facility]) - CM/SW Discharge Note   Patient Details  Name: Rhonda Bailey MRN: [id number] Date of Birth: [birth year]  Transition of Care [facility]) CM/SW Contact:  Dessa Phi, RN Phone Number: 05/24/2020, 11:35 AM   Clinical Narrative: Spoke to patient about d/c plans-PT recc HHPT;patient ordered for HHPT/OT-patient agreed to [facility] chosen for. No further CM needs.      Final next level of care: Home w Home Health Services Barriers to Discharge: No Barriers Identified   Patient Goals and CMS Choice   CMS Medicare.gov Compare Post Acute Care list provided to:: Patient Choice offered to / list presented to : Patient  Discharge Placement                       Discharge Plan and Services                          HH Arranged: PT, OT [facility] Agency: [place] Date [place]: 05/24/20 Time [place]: [id number] Representative spoke with at [place]: [place] ([place]) Interventions     Readmission Risk Interventions No flowsheet data found.

## 2020-05-24 NOTE — Progress Notes (Signed)
Occupational Therapy Treatment Patient Details Name: Rhonda Bailey MRN: [id number] DOB: 22-May-1946 Today's Date: 05/24/2020    History of present illness 74 y.o. female with medical history of inflammatory bowel disease initially diagnosed via colonoscopy on 12/04/2019.  The patient also has history of hypothyroidism, anxiety, and iron deficiency anemia.  admitted with nausea, vomiting, and diarrhea. went into respiratory distress d/t fluid overload requiring 5L O2, down to 2L at time of PT eval   OT comments  This 74 yo female is doing well today, no need for supplemental O2 (sats staying 92-96% on RA), working being up and about without RW ( as she was pta)--but still safer with RW for now. Doing some grooming sitting and some in standing, showing safety with toileting and toileting transfers. She will continue to benefit from acute OT with follow up [place].  Follow Up Recommendations  Home health OT;Supervision - Intermittent    Equipment Recommendations  None recommended by OT       Precautions / Restrictions Precautions Precautions: Fall Precaution Comments: monitor O2 (did well today on RA 92-96%) Restrictions Weight Bearing Restrictions: No       Mobility Bed Mobility Overal bed mobility: Independent                Transfers Overall transfer level: Needs assistance Equipment used: Rolling walker (2 wheeled) Transfers: Sit to/from Stand Sit to Stand: Supervision              Balance Overall balance assessment: Mild deficits observed, not formally tested                                         ADL either performed or assessed with clinical judgement   ADL Overall ADL's : Needs assistance/impaired     Grooming: Wash/dry hands;Wash/dry face;Oral care;Standing;Supervision/safety Grooming Details (indicate cue type and reason): hair care sitting EOB (setup)                 Toilet Transfer: Min guard;Ambulation (no AD, with AD S)    Toileting- Clothing Manipulation and Hygiene: Independent;Sitting/lateral lean               Vision Patient Visual Report: No change from baseline            Cognition Arousal/Alertness: Awake/alert Behavior During Therapy: WFL for tasks assessed/performed Overall Cognitive Status: Within Functional Limits for tasks assessed                                                     Pertinent Vitals/ Pain       Pain Assessment: 0-10 Pain Score: 2  Pain Location: back Pain Descriptors / Indicators: Sore Pain Intervention(s): Monitored during session;Heat applied         Frequency  Min 2X/week        Progress Toward Goals  OT Goals(current goals can now be found in the care plan section)  Progress towards OT goals: Progressing toward goals     Plan Discharge plan remains appropriate    Co-evaluation    PT/OT/SLP Co-Evaluation/Treatment: Yes Reason for Co-Treatment: To address functional/ADL transfers PT goals addressed during session: Mobility/safety with mobility;Strengthening/ROM OT goals addressed during session: ADL's and self-care      AM-PAC  OT "6 Clicks" Daily Activity     Outcome Measure   Help from another person eating meals?: None Help from another person taking care of personal grooming?: A Little (S) Help from another person toileting, which includes using toliet, bedpan, or urinal?: A Little (S) Help from another person bathing (including washing, rinsing, drying)?: A Little (S) Help from another person to put on and taking off regular upper body clothing?: A Little (S) Help from another person to put on and taking off regular lower body clothing?: A Little (S) 6 Click Score: 19    End of Session Equipment Utilized During Treatment: Rolling walker;Gait belt  OT Visit Diagnosis: Unsteadiness on feet (R26.81);Muscle weakness (generalized) (M62.81);Pain Pain - part of body:  (back)   Activity Tolerance Patient tolerated  treatment well   Patient Left in chair;with call bell/phone within reach;with chair alarm set   Nurse Communication  (HR increased into 120s at one point while ambulating)        Time: 2909-0301 OT Time Calculation (min): 24 min  Charges: OT General Charges $OT Visit: 1 Visit OT Treatments $Self Care/Home Management : 8-22 mins  [place], OTR/L Acute [employer] Pager [phone] Office [phone]      Almon Register 05/24/2020, 12:42 PM

## 2020-05-24 NOTE — Progress Notes (Addendum)
    Progress Note   Subjective  Chief Complaint: Urinary sepsis/IBD   Patient very much improved this morning.  She tells me that she is tolerating a regular diet and is only passing about 3 stools a day which are starting to have formed since adding stairs.  She denies any abdominal pain or other complaints this morning and tells me that she needs to get home because she has bills to pay.   Objective   Vital signs in last 24 hours: Temp:  [97.9 F (36.6 C)-99.2 F (37.3 C)] 99.2 F (37.3 C) (10/18 0608) Pulse Rate:  [81-90] 81 (10/18 0608) Resp:  [16-18] 16 (10/18 0608) BP: (121-142)/(60-80) 121/60 (10/18 0608) SpO2:  [93 %-96 %] 93 % (10/18 6203) Weight:  [77.1 kg] 77.1 kg (10/18 0526) Last BM Date: 05/23/20 General:    white female in NAD Heart:  Regular rate and rhythm; no murmurs Lungs: Respirations even and unlabored, lungs CTA bilaterally Abdomen:  Soft, nontender and nondistended. Normal bowel sounds. Extremities:  Without edema. Neurologic:  Alert and oriented,  grossly normal neurologically. Psych:  Cooperative. Normal mood and affect.  Intake/Output from previous day: 10/17 0701 - 10/18 0700 In: 655 [P.O.:580] Out: 500 [Urine:500]  Lab Results: Recent Labs    05/22/20 0740 05/23/20 0637 05/24/20 0517  WBC 10.7* 8.9 10.2  HGB 8.5* 9.4* 8.8*  HCT 28.2* 31.8* 28.8*  PLT 139* 244 332   BMET Recent Labs    05/22/20 0740 05/23/20 0637 05/24/20 0517  NA 140 141 138  K 3.3* 4.5 3.5  CL 99 102 102  CO2 30 28 26   GLUCOSE 109* 147* 107*  BUN 19 28* 25*  CREATININE 1.41* 1.37* 1.10*  CALCIUM 8.0* 8.7* 8.3*     Assessment / Plan:   Assessment: 1.  Urinary sepsis/Proteus bacteremia secondary to left ureteral stone: Status post ureteral stent placement, repeat CT without contrast did not show any evidence of pyelonephritis or abscess and hydronephrosis had resolved, distal colonic inflammatory changes also appeared to improve, labs continuing to improve 2.   IBD/indeterminate colitis: Improved over the past 48 hours with the addition of PCS, again Humira is currently on hold her morning on hold until her urinary sepsis is completely resolved and started with no moved 3.  Probable oropharyngeal candidiasis: Continue Mycostatin oral suspension 5 cc 4 times daily until resolution  Plan: 1.  Continue Azathioprine at current dose 2.  Continue Lialda 4.8 g daily 3.  Continue Budesonide 9 mg p.o. daily 4.  We have already arranged for follow-up with Tye Savoy, NP on 05/31/2020 at 9 AM 5.  Patient already has follow-up with Dr. [name] in early November. 6.  Adalimumab level antibody still pending. 7.  Please await final recommendations from Dr. Lyndel Safe later today.  We will sign off.  Thank youf or your kind consultation.   LOS: 7 days   Levin Erp  05/24/2020, 8:51 AM   Attending physician's note   I have taken an interval history, reviewed the chart and examined the patient. I agree with the Advanced Practitioner's note, impression and recommendations.   Doing much better from GI standpoint.  Had 3-4 softer BMs which is her baseline. No abdominal pain. Wants to go home.  Plan: -As above.  Please continue Lialda/azathioprine/budesonide -She already has FU next week with Nevin Bloodgood. -Likely, can resume Humira at follow-up visit. -Do recommend outpatient colonoscopy in 3-6 months. -Will sign off for now.  Carmell Austria, MD [facility]

## 2020-05-27 ENCOUNTER — Other Ambulatory Visit: Payer: Self-pay | Admitting: Gastroenterology

## 2020-05-27 ENCOUNTER — Telehealth: Payer: Self-pay | Admitting: Gastroenterology

## 2020-05-27 LAB — ADALIMUMAB LEVEL AND ANTIBODY
Adalimumab Drug Level: <0.6 ug/mL
Anti-Adalimumab Antibody: 109 ng/mL

## 2020-05-27 NOTE — Telephone Encounter (Signed)
Humira shot last Saturday but she was hospitalized at [facility] hospital for 5 days. SHe wants to know when she should have her humira injection. Pls call her.

## 2020-05-27 NOTE — Telephone Encounter (Signed)
Patty, I am awaiting the results of the Humira antibody levels to determine timing of potential restart versus need of transition in type of biologic therapy. I believe she has a follow-up with Nevin Bloodgood having been made previously, or that was my interpretation from what the inpatient team had told me. Please let me know and we will let her know once I have reviewed the antibody levels. Thanks. GM

## 2020-05-27 NOTE — Telephone Encounter (Signed)
Dr [name] please advise on timing of the pt next humira dose.  She was hospitalized when she was last due.

## 2020-05-28 ENCOUNTER — Other Ambulatory Visit: Payer: Self-pay | Admitting: Gastroenterology

## 2020-05-28 ENCOUNTER — Telehealth: Payer: Self-pay

## 2020-05-28 NOTE — Telephone Encounter (Signed)
Left message on machine to call back  

## 2020-05-28 NOTE — Telephone Encounter (Signed)
Received VM from pt stating that she is not physically able to come to new patient appt next week and would like to cancel. Pt states she will call back to r/s when she is "ready." Routed to referring MD. dph

## 2020-05-31 ENCOUNTER — Ambulatory Visit: Payer: Medicare HMO | Admitting: Nurse Practitioner

## 2020-05-31 NOTE — Telephone Encounter (Signed)
Patty, Thank you for the update. Happy to see her so we can discuss the results of her Humira antibody and trough levels. Continue to hold Humira for now. GM

## 2020-05-31 NOTE — Telephone Encounter (Signed)
I had to add her on for Thursday.  Sorry she was in the hospital and missed her appt and needs to be seen.

## 2020-05-31 NOTE — Telephone Encounter (Signed)
Patient returned your call, please call patient one more time.

## 2020-05-31 NOTE — Telephone Encounter (Deleted)
I'm confused. Her appointment was scheduled for today with [facility]. In appt comment it says cancelled because appt was not needed. So now she's added to our schedule for what reason? Also it does not look like she ever had Humira anti-body testing done because she was in the hospital. So she's coming in to find out when she needs to re-start her Humira, but we don't have anti body levels? Is there something I am missing?

## 2020-06-01 ENCOUNTER — Inpatient Hospital Stay: Payer: Medicare HMO | Admitting: Family

## 2020-06-01 ENCOUNTER — Inpatient Hospital Stay: Payer: Medicare HMO

## 2020-06-03 ENCOUNTER — Other Ambulatory Visit (INDEPENDENT_AMBULATORY_CARE_PROVIDER_SITE_OTHER): Payer: Medicare HMO

## 2020-06-03 ENCOUNTER — Telehealth: Payer: Self-pay

## 2020-06-03 ENCOUNTER — Encounter: Payer: Self-pay | Admitting: Gastroenterology

## 2020-06-03 ENCOUNTER — Ambulatory Visit: Payer: Medicare HMO | Admitting: Gastroenterology

## 2020-06-03 VITALS — BP 140/64 | HR 104 | Ht 65.5 in | Wt 157.2 lb

## 2020-06-03 DIAGNOSIS — N201 Calculus of ureter: Secondary | ICD-10-CM | POA: Diagnosis not present

## 2020-06-03 DIAGNOSIS — D509 Iron deficiency anemia, unspecified: Secondary | ICD-10-CM

## 2020-06-03 DIAGNOSIS — Z87442 Personal history of urinary calculi: Secondary | ICD-10-CM | POA: Diagnosis not present

## 2020-06-03 DIAGNOSIS — D500 Iron deficiency anemia secondary to blood loss (chronic): Secondary | ICD-10-CM

## 2020-06-03 DIAGNOSIS — Z79899 Other long term (current) drug therapy: Secondary | ICD-10-CM

## 2020-06-03 DIAGNOSIS — K50111 Crohn's disease of large intestine with rectal bleeding: Secondary | ICD-10-CM

## 2020-06-03 DIAGNOSIS — D849 Immunodeficiency, unspecified: Secondary | ICD-10-CM

## 2020-06-03 DIAGNOSIS — R399 Unspecified symptoms and signs involving the genitourinary system: Secondary | ICD-10-CM | POA: Diagnosis not present

## 2020-06-03 DIAGNOSIS — K529 Noninfective gastroenteritis and colitis, unspecified: Secondary | ICD-10-CM | POA: Diagnosis not present

## 2020-06-03 LAB — COMPREHENSIVE METABOLIC PANEL
ALT: 6 U/L (ref 0–35)
AST: 7 U/L (ref 0–37)
Albumin: 3.8 g/dL (ref 3.5–5.2)
Alkaline Phosphatase: 97 U/L (ref 39–117)
BUN: 13 mg/dL (ref 6–23)
CO2: 25 mEq/L (ref 19–32)
Calcium: 9.5 mg/dL (ref 8.4–10.5)
Chloride: 106 mEq/L (ref 96–112)
Creatinine, Ser: 1.16 mg/dL (ref 0.40–1.20)
GFR: 46.4 mL/min — ABNORMAL LOW (ref >60.00)
Glucose, Bld: 118 mg/dL — ABNORMAL HIGH (ref 70–99)
Potassium: 3.5 mEq/L (ref 3.5–5.1)
Sodium: 143 mEq/L (ref 135–145)
Total Bilirubin: 0.3 mg/dL (ref 0.2–1.2)
Total Protein: 7.1 g/dL (ref 6.0–8.3)

## 2020-06-03 LAB — HEPATIC FUNCTION PANEL
ALT: 6 U/L (ref 0–35)
AST: 7 U/L (ref 0–37)
Albumin: 3.8 g/dL (ref 3.5–5.2)
Alkaline Phosphatase: 97 U/L (ref 39–117)
Bilirubin, Direct: 0.1 mg/dL (ref 0.0–0.3)
Total Bilirubin: 0.3 mg/dL (ref 0.2–1.2)
Total Protein: 7.1 g/dL (ref 6.0–8.3)

## 2020-06-03 LAB — HIGH SENSITIVITY CRP: CRP, High Sensitivity: 37.78 mg/L — ABNORMAL HIGH (ref 0.000–5.000)

## 2020-06-03 LAB — CBC WITH DIFFERENTIAL/PLATELET
Basophils Absolute: 0.1 10*3/uL (ref 0.0–0.1)
Basophils Relative: 0.7 % (ref 0.0–3.0)
Eosinophils Absolute: 0.1 10*3/uL (ref 0.0–0.7)
Eosinophils Relative: 0.9 % (ref 0.0–5.0)
HCT: 32.5 % — ABNORMAL LOW (ref 36.0–46.0)
Hemoglobin: 10.1 g/dL — ABNORMAL LOW (ref 12.0–15.0)
Lymphocytes Relative: 17.8 % (ref 12.0–46.0)
Lymphs Abs: 2.1 10*3/uL (ref 0.7–4.0)
MCHC: 31 g/dL (ref 30.0–36.0)
MCV: 75.6 fl — ABNORMAL LOW (ref 78.0–100.0)
Monocytes Absolute: 0.7 10*3/uL (ref 0.1–1.0)
Monocytes Relative: 6.3 % (ref 3.0–12.0)
Neutro Abs: 8.8 10*3/uL — ABNORMAL HIGH (ref 1.4–7.7)
Neutrophils Relative %: 74.3 % (ref 43.0–77.0)
Platelets: 669 10*3/uL — ABNORMAL HIGH (ref 150.0–400.0)
RBC: 4.3 Mil/uL (ref 3.87–5.11)
RDW: 21.1 % — ABNORMAL HIGH (ref 11.5–15.5)
WBC: 11.8 10*3/uL — ABNORMAL HIGH (ref 4.0–10.5)

## 2020-06-03 LAB — IBC + FERRITIN
Ferritin: 95.9 ng/mL (ref 10.0–291.0)
Iron: 20 ug/dL — ABNORMAL LOW (ref 42–145)
Saturation Ratios: 6.5 % — ABNORMAL LOW (ref 20.0–50.0)
Transferrin: 221 mg/dL (ref 212.0–360.0)

## 2020-06-03 LAB — SEDIMENTATION RATE: Sed Rate: 100 mm/hr — ABNORMAL HIGH (ref 0–30)

## 2020-06-03 NOTE — Telephone Encounter (Signed)
FYI- Rhonda Bailey, this pt was seen today in the office. They have been given information on Remicade and Entyvio. They would like to check on patient assistance for both biologic's and then make a decision. They were also given 2 websites to look into for pt assistance. I  advised them once they have made a decision to please contact office and referral would be made at that time to the [place].I believe that you have already done a few things with the Remicade,but pt opted for Humira instead in the past. Dr.Mansouraty will have some notes in the chart once he has completed his visit note for today.

## 2020-06-03 NOTE — Patient Instructions (Addendum)
Continue your Uceris until to increase Azathioprine to 65m once daily as per discussed with Dr. MRush Landmark   Please contact Patty-RN  at [phone] you have made a decision on Remicade or Entyvio. You have been given information regarding both biologics. At that referral will be made to infusion center.   If you are age 380or older, your body mass index should be between 23-30. Your Body mass index is 25.77 kg/m. If this is out of the aforementioned range listed, please consider follow up with your Primary Care Provider.  If you are age 382or younger, your body mass index should be between 19-25. Your Body mass index is 25.77 kg/m. If this is out of the aformentioned range listed, please consider follow up with your Primary Care Provider.    Your provider has requested that you go to the basement level for lab work before leaving today. Press "B" on the elevator. The lab is located at the first door on the left as you exit the elevator.    Thank you for choosing [facility] and [facility].  Dr. MRush Landmark

## 2020-06-06 ENCOUNTER — Encounter: Payer: Self-pay | Admitting: Gastroenterology

## 2020-06-06 DIAGNOSIS — Z87442 Personal history of urinary calculi: Secondary | ICD-10-CM | POA: Insufficient documentation

## 2020-06-06 NOTE — Progress Notes (Signed)
[place] VISIT   Primary Care Provider [place], [place], [address] [place] [address] [phone]  Patient Profile: Rhonda Bailey is a 74 y.o. female with a pmh significant for hypertension, anxiety, hypothyroidism, sleep apnea, iron deficiency anemia, nephrolithiasis (recent hospitalization with significant urosepsis), hemorrhoids (status post resection), GERD, IBD (most likely Crohn's colitis versus pan ulcerative colitis -recently on AZA/Humira but now has antibodies).  The patient presents to the [facility] for an evaluation and management of problem(s) noted below:  Problem List 1. IBD (inflammatory bowel disease)   2. Iron deficiency anemia due to chronic blood loss   3. Long term current use of immunosuppressive drug   4. Immunosuppression ([place])   5. History of kidney stones     History of Present Illness Please see initial inpatient consultation note as well my prior progress notes for full details of HPI.  Interval History The patient since my last visit unfortunately ended up being hospitalized.  She developed urosepsis in the setting of significant nephrolithiasis.  She has been on antibiotic therapy and had interventions via urology.  During her hospitalization, the patient was seen as an inpatient by the [facility] team.  Humira was stopped and Humira antibody levels and trough were finally sent.  She was transition to serous during her hospital stay due to her persistent symptoms and had improvement in her overall symptoms of diarrhea at the time.  The patient eventually was discharged.  Since her discharge, her Humira antibodies have returned elevated and she had no significant trough.  The patient returns today with her daughter for further discussion and potential discussion of reinitiation of Humira.  She is feeling better.  Overall bowel movements have improved significantly while on the use serous.   She tried the Bentyl but did not feel it made any difference.  She has no follow-up with urology scheduled from them as of yet.  She is going to be seeing her primary care doctor later today.    BMs per day -2 times daily Nocturnal BMs -she states infrequently at this time (previously did not have any) Blood -none Mucous -none Tenesmus -none Urgency -still has some and is stable as prior Skin Manifestations -no new issues Eye Manifestations -no new issues Joint Manifestations -no new issues  GI Review of Systems Positive as above Negative for pyrosis, dysphagia, odynophagia, nausea, vomiting, melena, hematochezia  Review of Systems General: Positive for weight loss since her hospitalization which has been unintentional; denies fevers/chills HEENT: Denies oral lesions  Cardiovascular: Denies chest pain Pulmonary: Denies shortness of breath Gastroenterological: See HPI Genitourinary: Denies darkened urine Hematological: Positive for easy bruising/bleeding which is longstanding Dermatological: Denies jaundice Psychological: Mood is stable   Medications Current Outpatient Medications  Medication Sig Dispense Refill  . acidophilus (RISAQUAD) CAPS capsule Take 1 capsule by mouth daily.    Marland Kitchen ALPRAZolam (XANAX) 0.5 MG tablet Take 0.5 mg by mouth at bedtime as needed for sleep.    Marland Kitchen amLODipine (NORVASC) 2.5 MG tablet Take 1 tablet (2.5 mg total) by mouth daily as needed (high BP).    Marland Kitchen azaTHIOprine (IMURAN) 50 MG tablet Take 1 tablet (50 mg total) by mouth daily. 90 tablet 3  . budesonide (ENTOCORT EC) 3 MG 24 hr capsule Take 3 capsules (9 mg total) by mouth daily. 90 capsule 0  . dicyclomine (BENTYL) 10 MG capsule TAKE 1 CAPSULE (10 MG TOTAL) BY MOUTH 4 (FOUR) TIMES DAILY - BEFORE MEALS AND  AT BEDTIME. 90 capsule 0  . ferrous gluconate (FERGON) 324 MG tablet TAKE 1 TABLET BY MOUTH DAILY WITH BREAKFAST (Patient taking differently: Take 324 mg by mouth daily with breakfast. ) 90 tablet 1    . HYDROCORTISONE ACE, RECTAL, 30 MG SUPP Place 1 suppository (30 mg total) rectally in the morning and at bedtime. 12 suppository 3  . levothyroxine (SYNTHROID) 75 MCG tablet Take 0.5 tablets (37.5 mcg total) by mouth daily before breakfast. 20 tablet 1  . mesalamine (LIALDA) 1.2 g EC tablet TAKE 4 TABLETS BY MOUTH DAILY WITH BREAKFAST. 120 tablet 2  . ondansetron (ZOFRAN ODT) 8 MG disintegrating tablet Take 1 tablet (8 mg total) by mouth every 8 (eight) hours as needed for nausea or vomiting. 20 tablet 0  . oxyCODONE-acetaminophen (PERCOCET/ROXICET) 5-325 MG tablet Take 1 tablet by mouth every 4 (four) hours as needed for moderate pain or severe pain. 12 tablet 0  . pantoprazole (PROTONIX) 40 MG tablet Take 1 tablet (40 mg total) by mouth 2 (two) times daily before a meal. 60 tablet 1  . traZODone (DESYREL) 100 MG tablet Take 50-100 mg by mouth at bedtime as needed for sleep.     No current facility-administered medications for this visit.    Allergies Allergies  Allergen Reactions  . Tramadol     Says "blew my head up."    Histories Past Medical History:  Diagnosis Date  . Anal fissure   . Anxiety   . Anxiety disorder, unspecified   . Essential (primary) hypertension   . Gastro-esophageal reflux disease with esophagitis   . GERD (gastroesophageal reflux disease)   . Hypokalemia   . Hypothyroidism   . Kidney stone   . Noninfective gastroenteritis and colitis, unspecified   . Sleep apnea    Past Surgical History:  Procedure Laterality Date  . BIOPSY  09/02/2018   Procedure: BIOPSY;  Surgeon: Daneil Dolin, MD;  Location: [facility];  Service: Endoscopy;;  colon   . CHOLECYSTECTOMY  05/19/2011   Procedure: LAPAROSCOPIC CHOLECYSTECTOMY;  Surgeon: Jamesetta So;  Location: [facility] ORS;  Service: General;  Laterality: N/A;  . COLONOSCOPY  11/2019   Dr. [name]:  rectal ulceration, TI and IC valve appeared normal, 8 mm polyp in proximal ascending colon that was sessile.  Inflammation in transverse colon to cecum, inflammation with altered vascularity, edema, erosions, erythema, friability, shallow ulcerations in a continuous and circumferential pattern from rectum to splenix flexure, single twenty mm ulcer in distal rectum. Small and large-  . COLONOSCOPY WITH PROPOFOL N/A 09/02/2018   Dr. Gala Romney: Proctocolitis limited to the left side with some exudate versus partial pseudomembrane formation, erosions and ulceration somewhat superficial.  Terminal ileum normal.  Diverticulosis.  Descending/sigmoid colon biopsy showing severe active colitis, diagnostic features of IBD not identified.  Differential includes infection, ischemia.  Rectal biopsies with granulation tissue c/w ulcer  . CYSTOSCOPY W/ URETERAL STENT PLACEMENT Right 05/08/2019   Procedure: CYSTOSCOPY WITH RETROGRADE PYELOGRAM/URETERAL STENT PLACEMENT;  Surgeon: Festus Aloe, MD;  Location: [facility];  Service: Urology;  Laterality: Right;  . CYSTOSCOPY W/ URETERAL STENT PLACEMENT Left 05/17/2020   Procedure: CYSTOSCOPY WITH RETROGRADE PYELOGRAM/URETERAL STENT PLACEMENT;  Surgeon: Robley Fries, MD;  Location: [facility];  Service: Urology;  Laterality: Left;  . CYSTOSCOPY WITH URETEROSCOPY Right 06/11/2019   Procedure: CYSTOSCOPY WITH DIAGNOSTIC URETEROSCOPY; URETERAL STENT REMOVAL;  Surgeon: Cleon Gustin, MD;  Location: [facility];  Service: Urology;  Laterality: Right;  . HEMORRHOID SURGERY N/A 09/16/2019  Procedure: EXTENSIVE HEMORRHOIDECTOMY;  Surgeon: Aviva Signs, MD;  Location: [facility];  Service: General;  Laterality: N/A;  . RECTAL BIOPSY N/A 09/16/2019   Procedure: BIOPSY RECTAL;  Surgeon: Aviva Signs, MD;  Location: [facility];  Service: General;  Laterality: N/A;  . TUBAL LIGATION     Social History   Socioeconomic History  . Marital status: Divorced    Spouse name: Not on file  . Number of children: 2  . Years of education: Not on file  . Highest education level: Not on file  Occupational  History  . Occupation: retired  Tobacco Use  . Smoking status: Never Smoker  . Smokeless tobacco: Never Used  Vaping Use  . Vaping Use: Never used  Substance and Sexual Activity  . Alcohol use: Not Currently    Comment: once a year  . Drug use: No  . Sexual activity: Not Currently  Other Topics Concern  . Not on file  Social History Narrative  . Not on file   Social Determinants of Health   Financial Resource Strain:   . Difficulty of Paying Living Expenses: Not on file  Food Insecurity:   . Worried About Charity fundraiser in the Last Year: Not on file  . Ran Out of Food in the Last Year: Not on file  Transportation Needs:   . Lack of Transportation (Medical): Not on file  . Lack of Transportation (Non-Medical): Not on file  Physical Activity:   . Days of Exercise per Week: Not on file  . Minutes of Exercise per Session: Not on file  Stress:   . Feeling of Stress : Not on file  Social Connections:   . Frequency of Communication with Friends and Family: Not on file  . Frequency of Social Gatherings with Friends and Family: Not on file  . Attends Religious Services: Not on file  . Active Member of Clubs or Organizations: Not on file  . Attends Archivist Meetings: Not on file  . Marital Status: Not on file  Intimate Partner Violence:   . Fear of Current or Ex-Partner: Not on file  . Emotionally Abused: Not on file  . Physically Abused: Not on file  . Sexually Abused: Not on file   Family History  Problem Relation Age of Onset  . Cancer Father        mouth  . Heart disease Sister   . Heart disease Brother   . Cancer Brother        head  . Cancer Daughter        ? type  . Anesthesia problems Neg Hx   . Hypotension Neg Hx   . Malignant hyperthermia Neg Hx   . Pseudochol deficiency Neg Hx   . Colon cancer Neg Hx   . Esophageal cancer Neg Hx   . Inflammatory bowel disease Neg Hx   . Liver disease Neg Hx   . Pancreatic cancer Neg Hx   . Rectal  cancer Neg Hx   . Stomach cancer Neg Hx    I have reviewed her medical, social, and family history in detail and updated the electronic medical record as necessary.    PHYSICAL EXAMINATION  BP 140/64 (BP Location: Left Arm, Patient Position: Sitting, Cuff Size: Normal)   Pulse (!) 104 Comment: irregular  Ht 5' 5.5" (1.664 m)   Wt 157 lb 4 oz (71.3 kg)   BMI 25.77 kg/m  Wt Readings from Last 3 Encounters:  06/03/20 157 lb 4  oz (71.3 kg)  05/24/20 169 lb 14.4 oz (77.1 kg)  04/23/20 167 lb 6 oz (75.9 kg)  GEN: No acute distress, nontoxic appearing, accompanied by sister, able to get up to the examination table without significant issue/discomfort PSYCH: Cooperative, without pressured speech EYE: Conjunctivae pink, sclerae anicteric ENT: MMM, without oral ulcers CV: Mildly tachycardic but not irregularly irregular RESP: No audible wheezing GI: NABS, soft, mildly protuberant, nontender, no rebound or guarding MSK/EXT: Discomfort in the right CVA region as well as right flank but not going into the groin, negative right and left leg raise test SKIN: No jaundice NEURO:  Alert & Oriented x 3, no focal deficits   REVIEW OF DATA  I reviewed the following data at the time of this encounter:  GI Procedures and Studies  Previously reviewed  Laboratory Studies  Reviewed those in epic  Imaging Studies  May 17, 2020 CT abdomen pelvis without contrast IMPRESSION: Obstructing calculus or adjacent calculi within the mid left ureter with mild proximal hydroureteronephrosis. Wall thickening involving much of the colon, which could be on the basis of underdistention or reflect infectious/inflammatory colitis given provided history. Distal colonic diverticulosis.  May 20, 2020 CT abdomen pelvis without contrast IMPRESSION: There is new stranding in subcutaneous fat of the left abdomen which may be due to dependent change but is nonspecific. Trace bilateral pleural effusions and  dependent atelectasis have mildly increased since the prior CT. Resolved left hydronephrosis with a new double-J ureteral stone in good position. 3-4 punctate left renal stones are again seen. Some improvement of inflammatory change about the descending colon. Fatty infiltration of the liver. Aortic Atherosclerosis (ICD10-I70.0).   ASSESSMENT  Ms. Wickizer is a 74 y.o. female with a pmh significant for hypertension, anxiety, hypothyroidism, sleep apnea, iron deficiency anemia, nephrolithiasis (recent hospitalization with significant urosepsis), hemorrhoids (status post resection), GERD, IBD (most likely Crohn's colitis versus pan ulcerative colitis -recently on AZA/Humira but now has antibodies).  The patient is seen today for evaluation and management of:  1. IBD (inflammatory bowel disease)   2. Iron deficiency anemia due to chronic blood loss   3. Long term current use of immunosuppressive drug   4. Immunosuppression ([place])   5. History of kidney stones    Patient is currently hemodynamically and clinically stable.  She had quite a hospitalization in the setting of significant nephrolithiasis leading to urosepsis and subsequent urology intervention.  She needs follow-up with urology as she describes not having anything set up at this time.  We will place a referral to urology and ensure that her PCP also follows up on this.  In regards to the patient's underlying inflammatory bowel disease, she was transition to you serous while in the inpatient status it was discharged on this.  She has done well while on this therapy.  Unfortunately, the patient laboratories did confirm that she now has Humira antibodies and had a negative trough.  As such, it looks like there is no benefit from restarting Humira at this time (could consider weekly dosing to try to decrease the antibody levels but I think a change in medications is reasonable to consider.  We will increase her azathioprine as well slightly to help  try and decrease the risk of recurrence of antibodies for any other therapies.  We discussed the use of Remicade.  We also discussed the use of Entyvio which may not have as many potential side effects or antibody issues since it is GI Related.  The patient and daughter  were given information to further evaluate how they would like to proceed.  They are going to look into patient assistance programs as well for either of these medications.  We will continue the current serous dosing for now but as it is a steroid we will need to get her off of this at some point but hopefully that will be as we transition her onto another biologic.  She has prior iron deficiency and will repeat labs to see where she stands although I suspect she will still be iron deficient and may require iron infusions in the future.  She needs to continue on her antibiotics until she follows up with her primary care doctor and likely urology.  She will remain on Lialda.  We will see her back in follow-up and see how and what she decides in regards to transition of medications from a biologic perspective.  All patient and family questions were answered to the best of my ability, and the patient agrees to the aforementioned plan of action with follow-up as indicated.   PLAN  Laboratories as outlined below Continue budesonide 9 mg daily for now Continue Lialda 4.8 g daily Increase azathioprine to 75 mg daily Patient will transition to Remicade or Entyvio she and daughter will let us know what they decide -Likely will need patient assistance Follow-up iron indices and consider IV iron infusion Follow-up with PCP and urology   Orders Placed This Encounter  Procedures  . CBC  . Comp Met (CMET)  . Ambulatory referral to Urology    New Prescriptions   No medications on file   Modified Medications   No medications on file    Planned Follow Up No follow-ups on file.   Total Time in Face-to-Face and in Coordination of Care  for patient including independent/personal interpretation/review of prior testing, medical history, examination, medication adjustment, communicating results with the patient directly, and documentation with the EHR is more than 30 minutes.   Justice Britain, MD [facility] Office # [phone]

## 2020-06-07 ENCOUNTER — Other Ambulatory Visit: Payer: Self-pay

## 2020-06-07 DIAGNOSIS — D500 Iron deficiency anemia secondary to blood loss (chronic): Secondary | ICD-10-CM

## 2020-06-07 LAB — ADALIMUMAB+AB (SERIAL MONITOR)

## 2020-06-07 LAB — UIBC: UIBC: 238 ug/dL (ref 118–369)

## 2020-06-07 LAB — CALPROTECTIN, FECAL

## 2020-06-07 NOTE — Telephone Encounter (Signed)
Pt is requesting a call back from a nurse to further discuss the medications, pt wants to know which medication is more popular and if there exists some financial help.

## 2020-06-08 ENCOUNTER — Other Ambulatory Visit: Payer: Self-pay

## 2020-06-08 DIAGNOSIS — K529 Noninfective gastroenteritis and colitis, unspecified: Secondary | ICD-10-CM

## 2020-06-09 ENCOUNTER — Other Ambulatory Visit: Payer: Self-pay

## 2020-06-09 ENCOUNTER — Telehealth: Payer: Self-pay | Admitting: Family

## 2020-06-09 ENCOUNTER — Encounter: Payer: Self-pay | Admitting: Urology

## 2020-06-09 ENCOUNTER — Ambulatory Visit (INDEPENDENT_AMBULATORY_CARE_PROVIDER_SITE_OTHER): Payer: Medicare HMO | Admitting: Urology

## 2020-06-09 VITALS — BP 154/74 | HR 105 | Temp 97.7°F | Ht 65.5 in | Wt 157.0 lb

## 2020-06-09 DIAGNOSIS — N200 Calculus of kidney: Secondary | ICD-10-CM

## 2020-06-09 LAB — URINALYSIS, ROUTINE W REFLEX MICROSCOPIC
Bilirubin, UA: NEGATIVE
Glucose, UA: NEGATIVE
Nitrite, UA: NEGATIVE
Specific Gravity, UA: 1.02 (ref 1.005–1.030)
Urobilinogen, Ur: 0.2 mg/dL (ref 0.2–1.0)
pH, UA: 5.5 (ref 5.0–7.5)

## 2020-06-09 LAB — MICROSCOPIC EXAMINATION: Renal Epithel, UA: NONE SEEN /hpf

## 2020-06-09 MED ORDER — MIRABEGRON ER 25 MG PO TB24: 25.0000 mg | ORAL_TABLET | Freq: Every day | ORAL | 0 refills | Status: DC

## 2020-06-09 MED ORDER — OXYCODONE-ACETAMINOPHEN 5-325 MG PO TABS
1.0000 | ORAL_TABLET | Freq: Four times a day (QID) | ORAL | 0 refills | Status: DC | PRN
Start: 2020-06-09 — End: 2020-06-23

## 2020-06-09 MED ORDER — TAMSULOSIN HCL 0.4 MG PO CAPS: 0.4000 mg | ORAL_CAPSULE | Freq: Every day | ORAL | 1 refills | Status: DC

## 2020-06-09 NOTE — Patient Instructions (Signed)
Ureteroscopy Ureteroscopy is a procedure to check for and treat problems inside part of the urinary tract. In this procedure, a thin, tube-shaped instrument with a light at the end (ureteroscope) is used to look at the inside of the kidneys and the ureters, which are the tubes that carry urine from the kidneys to the bladder. The ureteroscope is inserted into one or both of the ureters. You may need this procedure if you have frequent urinary tract infections (UTIs), blood in your urine, or a stone in one of your ureters. A ureteroscopy can be done to find the cause of urine blockage in a ureter and to evaluate other abnormalities inside the ureters or kidneys. If stones are found, they can be removed during the procedure. Polyps, abnormal tissue, and some types of tumors can also be removed or treated. The ureteroscope may also have a tool to remove tissue to be checked for disease under a microscope (biopsy). Tell a health care provider about:  Any allergies you have.  All medicines you are taking, including vitamins, herbs, eye drops, creams, and over-the-counter medicines.  Any problems you or family members have had with anesthetic medicines.  Any blood disorders you have.  Any surgeries you have had.  Any medical conditions you have.  Whether you are pregnant or may be pregnant. What are the risks? Generally, this is a safe procedure. However, problems may occur, including:  Bleeding.  Infection.  Allergic reactions to medicines.  Scarring that narrows the ureter (stricture).  Creating a hole in the ureter (perforation). What happens before the procedure? Staying hydrated Follow instructions from your health care provider about hydration, which may include:  Up to 2 hours before the procedure - you may continue to drink clear liquids, such as water, clear fruit juice, black coffee, and plain tea. Eating and drinking restrictions Follow instructions from your health care  provider about eating and drinking, which may include:  8 hours before the procedure - stop eating heavy meals or foods such as meat, fried foods, or fatty foods.  6 hours before the procedure - stop eating light meals or foods, such as toast or cereal.  6 hours before the procedure - stop drinking milk or drinks that contain milk.  2 hours before the procedure - stop drinking clear liquids. Medicines  Ask your health care provider about: ? Changing or stopping your regular medicines. This is especially important if you are taking diabetes medicines or blood thinners. ? Taking medicines such as aspirin and ibuprofen. These medicines can thin your blood. Do not take these medicines before your procedure if your health care provider instructs you not to.  You may be given antibiotic medicine to help prevent infection. General instructions  You may have a urine sample taken to check for infection.  Plan to have someone take you home from the hospital or clinic. What happens during the procedure?   To reduce your risk of infection: ? Your health care team will wash or sanitize their hands. ? Your skin will be washed with soap.  An IV tube will be inserted into one of your veins.  You will be given one of the following: ? A medicine to help you relax (sedative). ? A medicine to make you fall asleep (general anesthetic). ? A medicine that is injected into your spine to numb the area below and slightly above the injection site (spinal anesthetic).  To lower your risk of infection, you may be given an antibiotic medicine  by an injection or through the IV tube.  The opening from which you urinate (urethra) will be cleaned with a germ-killing solution.  The ureteroscope will be passed through your urethra into your bladder.  A salt-water solution will flow through the ureteroscope to fill your bladder. This will help the health care provider see the openings of your ureters more  clearly.  Then, the ureteroscope will be passed into your ureter. ? If a growth is found, a piece of it may be removed so it can be examined under a microscope (biopsy). ? If a stone is found, it may be removed through the ureteroscope, or the stone may be broken up using a laser, shock waves, or electrical energy. ? In some cases, if the ureter is too small, a tube may be inserted that keeps the ureter open (ureteral stent). The stent may be left in place for 1 or 2 weeks to keep the ureter open, and then the ureteroscopy procedure will be performed.  The scope will be removed, and your bladder will be emptied. The procedure may vary among health care providers and hospitals. What happens after the procedure?  Your blood pressure, heart rate, breathing rate, and blood oxygen level will be monitored until the medicines you were given have worn off.  You may be asked to urinate.  Donot drive for 24 hours if you were given a sedative. This information is not intended to replace advice given to you by your health care provider. Make sure you discuss any questions you have with your health care provider. Document Revised: 07/06/2017 Document Reviewed: 05/05/2016 Elsevier Patient Education  2020 [employer].

## 2020-06-09 NOTE — Progress Notes (Signed)
06/09/2020 3:28 PM   Rhonda Bailey Rinaldo Cloud 1946-06-23 [id number]  Referring provider: [name], MD [facility] [address],  [place] [address]  Nephrolithiasis  HPI: Rhonda Bailey is a 74yo here for followup for nephrolithiasis. She underwent left ureteral stent placement 05/17/2020 for sepsis from a ureteral calculus. Currently she has dull flank pain that is related to movement. He has urgency, frequency and occasional dysuria with the stent in place   PMH: Past Medical History:  Diagnosis Date  . Anal fissure   . Anxiety   . Anxiety disorder, unspecified   . Essential (primary) hypertension   . Gastro-esophageal reflux disease with esophagitis   . GERD (gastroesophageal reflux disease)   . Hypokalemia   . Hypothyroidism   . Kidney stone   . Noninfective gastroenteritis and colitis, unspecified   . Sleep apnea     Surgical History: Past Surgical History:  Procedure Laterality Date  . BIOPSY  09/02/2018   Procedure: BIOPSY;  Surgeon: Daneil Dolin, MD;  Location: [facility];  Service: Endoscopy;;  colon   . CHOLECYSTECTOMY  05/19/2011   Procedure: LAPAROSCOPIC CHOLECYSTECTOMY;  Surgeon: Jamesetta So;  Location: [facility];  Service: General;  Laterality: N/A;  . COLONOSCOPY  11/2019   Dr. [name]:  rectal ulceration, TI and IC valve appeared normal, 8 mm polyp in proximal ascending colon that was sessile. Inflammation in transverse colon to cecum, inflammation with altered vascularity, edema, erosions, erythema, friability, shallow ulcerations in a continuous and circumferential pattern from rectum to splenix flexure, single twenty mm ulcer in distal rectum. Small and large-  . COLONOSCOPY WITH PROPOFOL N/A 09/02/2018   Dr. Gala Romney: Proctocolitis limited to the left side with some exudate versus partial pseudomembrane formation, erosions and ulceration somewhat superficial.  Terminal ileum normal.  Diverticulosis.  Descending/sigmoid colon biopsy showing severe  active colitis, diagnostic features of IBD not identified.  Differential includes infection, ischemia.  Rectal biopsies with granulation tissue c/w ulcer  . CYSTOSCOPY W/ URETERAL STENT PLACEMENT Right 05/08/2019   Procedure: CYSTOSCOPY WITH RETROGRADE PYELOGRAM/URETERAL STENT PLACEMENT;  Surgeon: Festus Aloe, MD;  Location: [facility];  Service: Urology;  Laterality: Right;  . CYSTOSCOPY W/ URETERAL STENT PLACEMENT Left 05/17/2020   Procedure: CYSTOSCOPY WITH RETROGRADE PYELOGRAM/URETERAL STENT PLACEMENT;  Surgeon: Robley Fries, MD;  Location: [facility];  Service: Urology;  Laterality: Left;  . CYSTOSCOPY WITH URETEROSCOPY Right 06/11/2019   Procedure: CYSTOSCOPY WITH DIAGNOSTIC URETEROSCOPY; URETERAL STENT REMOVAL;  Surgeon: Cleon Gustin, MD;  Location: [facility];  Service: Urology;  Laterality: Right;  . HEMORRHOID SURGERY N/A 09/16/2019   Procedure: EXTENSIVE HEMORRHOIDECTOMY;  Surgeon: Aviva Signs, MD;  Location: [facility];  Service: General;  Laterality: N/A;  . RECTAL BIOPSY N/A 09/16/2019   Procedure: BIOPSY RECTAL;  Surgeon: Aviva Signs, MD;  Location: [facility];  Service: General;  Laterality: N/A;  . TUBAL LIGATION      Home Medications:  Allergies as of 06/09/2020      Reactions   Tramadol    Says "blew my head up."      Medication List       Accurate as of June 09, 2020  3:28 PM. If you have any questions, ask your nurse or doctor.        acidophilus Caps capsule Take 1 capsule by mouth daily.   ALPRAZolam 0.5 MG tablet Commonly known as: XANAX Take 0.5 mg by mouth at bedtime as needed for sleep.   amLODipine 2.5 MG tablet Commonly  known as: NORVASC Take 1 tablet (2.5 mg total) by mouth daily as needed (high BP).   azaTHIOprine 50 MG tablet Commonly known as: IMURAN Take 1 tablet (50 mg total) by mouth daily.   budesonide 3 MG 24 hr capsule Commonly known as: ENTOCORT EC Take 3 capsules (9 mg total) by mouth daily.   dicyclomine 10 MG capsule Commonly  known as: BENTYL TAKE 1 CAPSULE (10 MG TOTAL) BY MOUTH 4 (FOUR) TIMES DAILY - BEFORE MEALS AND AT BEDTIME.   ferrous gluconate 324 MG tablet Commonly known as: FERGON TAKE 1 TABLET BY MOUTH DAILY WITH BREAKFAST   HYDROcodone-acetaminophen 5-325 MG tablet Commonly known as: NORCO/VICODIN   HYDROCORTISONE ACE (RECTAL) 30 MG Supp Place 1 suppository (30 mg total) rectally in the morning and at bedtime.   levothyroxine 75 MCG tablet Commonly known as: SYNTHROID Take 0.5 tablets (37.5 mcg total) by mouth daily before breakfast.   mesalamine 1.2 g EC tablet Commonly known as: LIALDA TAKE 4 TABLETS BY MOUTH DAILY WITH BREAKFAST.   ondansetron 8 MG disintegrating tablet Commonly known as: Zofran ODT Take 1 tablet (8 mg total) by mouth every 8 (eight) hours as needed for nausea or vomiting.   oxyCODONE-acetaminophen 5-325 MG tablet Commonly known as: PERCOCET/ROXICET Take 1 tablet by mouth every 4 (four) hours as needed for moderate pain or severe pain.   pantoprazole 40 MG tablet Commonly known as: PROTONIX Take 1 tablet (40 mg total) by mouth 2 (two) times daily before a meal.   traZODone 100 MG tablet Commonly known as: DESYREL Take 50-100 mg by mouth at bedtime as needed for sleep.       Allergies:  Allergies  Allergen Reactions  . Tramadol     Says "blew my head up."    Family History: Family History  Problem Relation Age of Onset  . Cancer Father        mouth  . Heart disease Sister   . Heart disease Brother   . Cancer Brother        head  . Cancer Daughter        ? type  . Anesthesia problems Neg Hx   . Hypotension Neg Hx   . Malignant hyperthermia Neg Hx   . Pseudochol deficiency Neg Hx   . Colon cancer Neg Hx   . Esophageal cancer Neg Hx   . Inflammatory bowel disease Neg Hx   . Liver disease Neg Hx   . Pancreatic cancer Neg Hx   . Rectal cancer Neg Hx   . Stomach cancer Neg Hx     Social History:  reports that she has never smoked. She has never  used smokeless tobacco. She reports previous alcohol use. She reports that she does not use drugs.  ROS: All other review of systems were reviewed and are negative except what is noted above in HPI  Physical Exam: BP (!) 154/74   Pulse (!) 105   Temp 97.7 F (36.5 C)   Ht 5' 5.5" (1.664 m)   Wt 157 lb (71.2 kg)   BMI 25.73 kg/m   Constitutional:  Alert and oriented, No acute distress. HEENT: [place] AT, moist mucus membranes.  Trachea midline, no masses. Cardiovascular: No clubbing, cyanosis, or edema. Respiratory: Normal respiratory effort, no increased work of breathing. GI: Abdomen is soft, nontender, nondistended, no abdominal masses GU: No CVA tenderness.  Lymph: No cervical or inguinal lymphadenopathy. Skin: No rashes, bruises or suspicious lesions. Neurologic: Grossly intact, no focal deficits, moving  all 4 extremities. Psychiatric: Normal mood and affect.  Laboratory Data: Lab Results  Component Value Date   WBC 11.8 (H) 06/03/2020   HGB 10.1 (L) 06/03/2020   HCT 32.5 (L) 06/03/2020   MCV 75.6 (L) 06/03/2020   PLT 669.0 (H) 06/03/2020    Lab Results  Component Value Date   CREATININE 1.16 06/03/2020    No results found for: PSA  No results found for: TESTOSTERONE  Lab Results  Component Value Date   HGBA1C 6.2 (H) 05/08/2019    Urinalysis    Component Value Date/Time   COLORURINE AMBER (A) 05/18/2020 0418   APPEARANCEUR TURBID (A) 05/18/2020 0418   LABSPEC 1.010 05/18/2020 0418   PHURINE 5.0 05/18/2020 0418   GLUCOSEU NEGATIVE 05/18/2020 0418   GLUCOSEU NEGATIVE 04/23/2020 1102   HGBUR LARGE (A) 05/18/2020 0418   BILIRUBINUR NEGATIVE 05/18/2020 0418   KETONESUR 5 (A) 05/18/2020 0418   PROTEINUR 30 (A) 05/18/2020 0418   UROBILINOGEN 0.2 04/23/2020 1102   NITRITE NEGATIVE 05/18/2020 0418   LEUKOCYTESUR LARGE (A) 05/18/2020 0418    Lab Results  Component Value Date   BACTERIA RARE (A) 05/18/2020    Pertinent Imaging: CT abd/pelvis 05/20/2020:  Images reviewed and discussed with the patient No results found for this or any previous visit.  No results found for this or any previous visit.  No results found for this or any previous visit.  No results found for this or any previous visit.  No results found for this or any previous visit.  No results found for this or any previous visit.  No results found for this or any previous visit.  Results for orders placed during the hospital encounter of 05/08/19  CT Renal Stone Study  Narrative CLINICAL DATA:  Flank pain and hematuria  EXAM: CT ABDOMEN AND PELVIS WITHOUT CONTRAST  TECHNIQUE: Multidetector CT imaging of the abdomen and pelvis was performed following the standard protocol without oral or IV contrast.  COMPARISON:  August 29, 2018  FINDINGS: Lower chest: There is no lung base edema or consolidation. There is a small hiatal hernia.  Hepatobiliary: There are scattered tiny calcifications in the liver, likely indicative of prior granulomatous disease. Beyond the small calcifications, no focal liver lesions are evident. Gallbladder is absent. There is no appreciable biliary duct dilatation.  Pancreas: There is fatty infiltration in portions of pancreas. No pancreatic mass or inflammatory focus evident.  Spleen: No splenic lesions appreciable.  Adrenals/Urinary Tract: Right adrenal appears normal. There is a stable left adrenal mass measuring 2.2 x 2.0 cm, indeterminate by CT criteria. Right kidney is mildly edematous. There is no evident pelvic mass on either side. There is mild hydronephrosis on the right. There is no appreciable hydronephrosis on the left. There is no intrarenal calculus on either side. There is a calculus in the right ureter at the mid sacral level measuring 6 x 4 mm. No other ureteral calculi are evident. The wall of the urinary bladder is thickened diffusely. There is mild mesenteric stranding adjacent to the urinary bladder.   Stomach/Bowel: There are multiple sigmoid diverticula. There is slight thickening of the adjacent mesentery at the levels of the distal descending colon and proximal sigmoid colon. Suspect mild and potentially chronic diverticulitis. No associated fluid. No abscess or perforation. There is no appreciable bowel wall or mesenteric thickening. No evident bowel obstruction. Terminal ileum appears normal. No evident free air or portal venous air.  Vascular/Lymphatic: No abdominal aortic aneurysm. There are foci of aortic atherosclerosis.  There is no appreciable adenopathy in the abdomen or pelvis.  Reproductive: Uterus is midline.  No pelvic masses are evident.  Other: The appendix appears normal. There is no appreciable abscess or ascites in the abdomen or pelvis. Slight fat is noted in the umbilicus.  Musculoskeletal: There is spinal stenosis at L4-5 due to diffuse disc protrusion and bony hypertrophy. There is degenerative change at multiple sites in the lower thoracic and lumbar regions. No blastic or lytic bone lesions evident. No intramuscular lesions are appreciable.  IMPRESSION: 1. 6 x 4 mm calculus in the right ureter at the mid sacral level causing mild hydronephrosis on the right. Right kidney is subtly edematous.  2. Urinary bladder wall is diffusely thickened. There is soft tissue stranding adjacent to the urinary bladder, likely of inflammatory etiology.  3. Extensive sigmoid and distal descending colonic diverticulosis. Mild mesenteric thickening at the level of the distal descending colon and proximal sigmoid colon may represent mild and potentially chronic diverticulitis. Somewhat similar changes noted on prior CT. No fluid in this area of suspected mild inflammation of the distal descending and proximal sigmoid colon. No perforation or abscess.  4. Spinal stenosis at L4-5 due to diffuse disc protrusion and bony hypertrophy.  5. Stable left adrenal mass. This  mass has been followed with stability for less than 1 year. A lesion of this nature should be followed without change for a minimum of 1 year. In this regard, a follow-up CT in 1 year to confirm stability of the left adrenal mass advised. Note that based on attenuation criteria, it is an indeterminate category.  6. No bowel obstruction. No abscess in the abdomen or pelvis. Appendix appears normal.  7.  Small hiatal hernia.   Electronically Signed By: Lowella Grip III M.D. On: 05/08/2019 11:12   Assessment & Plan:    1. Nephrolithiasis -We discussed the management of kidney stones. These options include observation, ureteroscopy, shockwave lithotripsy (ESWL) and percutaneous nephrolithotomy (PCNL). We discussed which options are relevant to the patient's stone(s). We discussed the natural history of kidney stones as well as the complications of untreated stones and the impact on quality of life without treatment as well as with each of the above listed treatments. We also discussed the efficacy of each treatment in its ability to clear the stone burden. With any of these management options I discussed the signs and symptoms of infection and the need for emergent treatment should these be experienced. For each option we discussed the ability of each procedure to clear the patient of their stone burden.   For observation I described the risks which include but are not limited to silent renal damage, life-threatening infection, need for emergent surgery, failure to pass stone and pain.   For ureteroscopy I described the risks which include bleeding, infection, damage to contiguous structures, positioning injury, ureteral stricture, ureteral avulsion, ureteral injury, need for prolonged ureteral stent, inability to perform ureteroscopy, need for an interval procedure, inability to clear stone burden, stent discomfort/pain, heart attack, stroke, pulmonary embolus and the inherent risks with  general anesthesia.   For shockwave lithotripsy I described the risks which include arrhythmia, kidney contusion, kidney hemorrhage, need for transfusion, pain, inability to adequately break up stone, inability to pass stone fragments, Steinstrasse, infection associated with obstructing stones, need for alternate surgical procedure, need for repeat shockwave lithotripsy, MI, CVA, PE and the inherent risks with anesthesia/conscious sedation.   For PCNL I described the risks including positioning injury, pneumothorax, hydrothorax, need  for chest tube, inability to clear stone burden, renal laceration, arterial venous fistula or malformation, need for embolization of kidney, loss of kidney or renal function, need for repeat procedure, need for prolonged nephrostomy tube, ureteral avulsion, MI, CVA, PE and the inherent risks of general anesthesia.   - The patient would like to proceed with left ureteroscopic stone extraction   No follow-ups on file.  Nicolette Bang, MD  [facility] [place]

## 2020-06-09 NOTE — Telephone Encounter (Signed)
I called and spoke with patient regarding appointments scheduled.  New patient Packet was mailed as well

## 2020-06-09 NOTE — Progress Notes (Signed)
Urological Symptom Review  Patient is experiencing the following symptoms: Frequent urination Hard to postpone urination Get up at night to urinate Leakage of urine Stream starts and stops   Review of Systems  Gastrointestinal (upper)  : Nausea Indigestion/heartburn  Gastrointestinal (lower) : Diarrhea  Constitutional : Weight loss Fatigue  Skin: Negative for skin symptoms  Eyes: Negative for eye symptoms  Ear/Nose/Throat : Negative for Ear/Nose/Throat symptoms  Hematologic/Lymphatic: Easy bruising  Cardiovascular : Negative for cardiovascular symptoms  Respiratory : Negative for respiratory symptoms  Endocrine: Negative for endocrine symptoms  Musculoskeletal: Back pain Joint pain  Neurological: Dizziness  Psychologic: Negative for psychiatric symptoms

## 2020-06-09 NOTE — Telephone Encounter (Signed)
I spoke with the pt and advised her that all patients are different and respond differently to medications.  I advised her also that I have sent her referral to [facility] regarding Entyvio and they can help her with any financial option available.  The pt has been advised of the information and verbalized understanding.

## 2020-06-09 NOTE — Addendum Note (Signed)
Addended by: Valentina Lucks on: 06/09/2020 04:04 PM   Modules accepted: Orders

## 2020-06-09 NOTE — H&P (View-Only) (Signed)
06/09/2020 3:28 PM   Rhonda Bailey [birth year] [id number]  Referring provider: [name], MD [facility] [address],  [place] [address]  Nephrolithiasis  HPI: Rhonda Bailey is a 74yo here for followup for nephrolithiasis. She underwent left ureteral stent placement 05/17/2020 for sepsis from a ureteral calculus. Currently she has dull flank pain that is related to movement. He has urgency, frequency and occasional dysuria with the stent in place   PMH: Past Medical History:  Diagnosis Date  . Anal fissure   . Anxiety   . Anxiety disorder, unspecified   . Essential (primary) hypertension   . Gastro-esophageal reflux disease with esophagitis   . GERD (gastroesophageal reflux disease)   . Hypokalemia   . Hypothyroidism   . Kidney stone   . Noninfective gastroenteritis and colitis, unspecified   . Sleep apnea     Surgical History: Past Surgical History:  Procedure Laterality Date  . BIOPSY  09/02/2018   Procedure: BIOPSY;  Surgeon: Daneil Dolin, MD;  Location: [facility];  Service: Endoscopy;;  colon   . CHOLECYSTECTOMY  05/19/2011   Procedure: LAPAROSCOPIC CHOLECYSTECTOMY;  Surgeon: Jamesetta So;  Location: [facility];  Service: General;  Laterality: N/A;  . COLONOSCOPY  11/2019   Dr. [name]:  rectal ulceration, TI and IC valve appeared normal, 8 mm polyp in proximal ascending colon that was sessile. Inflammation in transverse colon to cecum, inflammation with altered vascularity, edema, erosions, erythema, friability, shallow ulcerations in a continuous and circumferential pattern from rectum to splenix flexure, single twenty mm ulcer in distal rectum. Small and large-  . COLONOSCOPY WITH PROPOFOL N/A 09/02/2018   Dr. Gala Romney: Proctocolitis limited to the left side with some exudate versus partial pseudomembrane formation, erosions and ulceration somewhat superficial.  Terminal ileum normal.  Diverticulosis.  Descending/sigmoid colon biopsy showing severe  active colitis, diagnostic features of IBD not identified.  Differential includes infection, ischemia.  Rectal biopsies with granulation tissue c/w ulcer  . CYSTOSCOPY W/ URETERAL STENT PLACEMENT Right 05/08/2019   Procedure: CYSTOSCOPY WITH RETROGRADE PYELOGRAM/URETERAL STENT PLACEMENT;  Surgeon: Festus Aloe, MD;  Location: [facility];  Service: Urology;  Laterality: Right;  . CYSTOSCOPY W/ URETERAL STENT PLACEMENT Left 05/17/2020   Procedure: CYSTOSCOPY WITH RETROGRADE PYELOGRAM/URETERAL STENT PLACEMENT;  Surgeon: Robley Fries, MD;  Location: [facility];  Service: Urology;  Laterality: Left;  . CYSTOSCOPY WITH URETEROSCOPY Right 06/11/2019   Procedure: CYSTOSCOPY WITH DIAGNOSTIC URETEROSCOPY; URETERAL STENT REMOVAL;  Surgeon: Cleon Gustin, MD;  Location: [facility];  Service: Urology;  Laterality: Right;  . HEMORRHOID SURGERY N/A 09/16/2019   Procedure: EXTENSIVE HEMORRHOIDECTOMY;  Surgeon: Aviva Signs, MD;  Location: [facility];  Service: General;  Laterality: N/A;  . RECTAL BIOPSY N/A 09/16/2019   Procedure: BIOPSY RECTAL;  Surgeon: Aviva Signs, MD;  Location: [facility];  Service: General;  Laterality: N/A;  . TUBAL LIGATION      Home Medications:  Allergies as of 06/09/2020      Reactions   Tramadol    Says "blew my head up."      Medication List       Accurate as of June 09, 2020  3:28 PM. If you have any questions, ask your nurse or doctor.        acidophilus Caps capsule Take 1 capsule by mouth daily.   ALPRAZolam 0.5 MG tablet Commonly known as: XANAX Take 0.5 mg by mouth at bedtime as needed for sleep.   amLODipine 2.5 MG tablet Commonly  known as: NORVASC Take 1 tablet (2.5 mg total) by mouth daily as needed (high BP).   azaTHIOprine 50 MG tablet Commonly known as: IMURAN Take 1 tablet (50 mg total) by mouth daily.   budesonide 3 MG 24 hr capsule Commonly known as: ENTOCORT EC Take 3 capsules (9 mg total) by mouth daily.   dicyclomine 10 MG capsule Commonly  known as: BENTYL TAKE 1 CAPSULE (10 MG TOTAL) BY MOUTH 4 (FOUR) TIMES DAILY - BEFORE MEALS AND AT BEDTIME.   ferrous gluconate 324 MG tablet Commonly known as: FERGON TAKE 1 TABLET BY MOUTH DAILY WITH BREAKFAST   HYDROcodone-acetaminophen 5-325 MG tablet Commonly known as: NORCO/VICODIN   HYDROCORTISONE ACE (RECTAL) 30 MG Supp Place 1 suppository (30 mg total) rectally in the morning and at bedtime.   levothyroxine 75 MCG tablet Commonly known as: SYNTHROID Take 0.5 tablets (37.5 mcg total) by mouth daily before breakfast.   mesalamine 1.2 g EC tablet Commonly known as: LIALDA TAKE 4 TABLETS BY MOUTH DAILY WITH BREAKFAST.   ondansetron 8 MG disintegrating tablet Commonly known as: Zofran ODT Take 1 tablet (8 mg total) by mouth every 8 (eight) hours as needed for nausea or vomiting.   oxyCODONE-acetaminophen 5-325 MG tablet Commonly known as: PERCOCET/ROXICET Take 1 tablet by mouth every 4 (four) hours as needed for moderate pain or severe pain.   pantoprazole 40 MG tablet Commonly known as: PROTONIX Take 1 tablet (40 mg total) by mouth 2 (two) times daily before a meal.   traZODone 100 MG tablet Commonly known as: DESYREL Take 50-100 mg by mouth at bedtime as needed for sleep.       Allergies:  Allergies  Allergen Reactions  . Tramadol     Says "blew my head up."    Family History: Family History  Problem Relation Age of Onset  . Cancer Father        mouth  . Heart disease Sister   . Heart disease Brother   . Cancer Brother        head  . Cancer Daughter        ? type  . Anesthesia problems Neg Hx   . Hypotension Neg Hx   . Malignant hyperthermia Neg Hx   . Pseudochol deficiency Neg Hx   . Colon cancer Neg Hx   . Esophageal cancer Neg Hx   . Inflammatory bowel disease Neg Hx   . Liver disease Neg Hx   . Pancreatic cancer Neg Hx   . Rectal cancer Neg Hx   . Stomach cancer Neg Hx     Social History:  reports that she has never smoked. She has never  used smokeless tobacco. She reports previous alcohol use. She reports that she does not use drugs.  ROS: All other review of systems were reviewed and are negative except what is noted above in HPI  Physical Exam: BP (!) 154/74   Pulse (!) 105   Temp 97.7 F (36.5 C)   Ht 5' 5.5" (1.664 m)   Wt 157 lb (71.2 kg)   BMI 25.73 kg/m   Constitutional:  Alert and oriented, No acute distress. HEENT: [place] AT, moist mucus membranes.  Trachea midline, no masses. Cardiovascular: No clubbing, cyanosis, or edema. Respiratory: Normal respiratory effort, no increased work of breathing. GI: Abdomen is soft, nontender, nondistended, no abdominal masses GU: No CVA tenderness.  Lymph: No cervical or inguinal lymphadenopathy. Skin: No rashes, bruises or suspicious lesions. Neurologic: Grossly intact, no focal deficits, moving  all 4 extremities. Psychiatric: Normal mood and affect.  Laboratory Data: Lab Results  Component Value Date   WBC 11.8 (H) 06/03/2020   HGB 10.1 (L) 06/03/2020   HCT 32.5 (L) 06/03/2020   MCV 75.6 (L) 06/03/2020   PLT 669.0 (H) 06/03/2020    Lab Results  Component Value Date   CREATININE 1.16 06/03/2020    No results found for: PSA  No results found for: TESTOSTERONE  Lab Results  Component Value Date   HGBA1C 6.2 (H) 05/08/2019    Urinalysis    Component Value Date/Time   COLORURINE AMBER (A) 05/18/2020 0418   APPEARANCEUR TURBID (A) 05/18/2020 0418   LABSPEC 1.010 05/18/2020 0418   PHURINE 5.0 05/18/2020 0418   GLUCOSEU NEGATIVE 05/18/2020 0418   GLUCOSEU NEGATIVE 04/23/2020 1102   HGBUR LARGE (A) 05/18/2020 0418   BILIRUBINUR NEGATIVE 05/18/2020 0418   KETONESUR 5 (A) 05/18/2020 0418   PROTEINUR 30 (A) 05/18/2020 0418   UROBILINOGEN 0.2 04/23/2020 1102   NITRITE NEGATIVE 05/18/2020 0418   LEUKOCYTESUR LARGE (A) 05/18/2020 0418    Lab Results  Component Value Date   BACTERIA RARE (A) 05/18/2020    Pertinent Imaging: CT abd/pelvis 05/20/2020:  Images reviewed and discussed with the patient No results found for this or any previous visit.  No results found for this or any previous visit.  No results found for this or any previous visit.  No results found for this or any previous visit.  No results found for this or any previous visit.  No results found for this or any previous visit.  No results found for this or any previous visit.  Results for orders placed during the hospital encounter of 05/08/19  CT Renal Stone Study  Narrative CLINICAL DATA:  Flank pain and hematuria  EXAM: CT ABDOMEN AND PELVIS WITHOUT CONTRAST  TECHNIQUE: Multidetector CT imaging of the abdomen and pelvis was performed following the standard protocol without oral or IV contrast.  COMPARISON:  August 29, 2018  FINDINGS: Lower chest: There is no lung base edema or consolidation. There is a small hiatal hernia.  Hepatobiliary: There are scattered tiny calcifications in the liver, likely indicative of prior granulomatous disease. Beyond the small calcifications, no focal liver lesions are evident. Gallbladder is absent. There is no appreciable biliary duct dilatation.  Pancreas: There is fatty infiltration in portions of pancreas. No pancreatic mass or inflammatory focus evident.  Spleen: No splenic lesions appreciable.  Adrenals/Urinary Tract: Right adrenal appears normal. There is a stable left adrenal mass measuring 2.2 x 2.0 cm, indeterminate by CT criteria. Right kidney is mildly edematous. There is no evident pelvic mass on either side. There is mild hydronephrosis on the right. There is no appreciable hydronephrosis on the left. There is no intrarenal calculus on either side. There is a calculus in the right ureter at the mid sacral level measuring 6 x 4 mm. No other ureteral calculi are evident. The wall of the urinary bladder is thickened diffusely. There is mild mesenteric stranding adjacent to the urinary  bladder.  Stomach/Bowel: There are multiple sigmoid diverticula. There is slight thickening of the adjacent mesentery at the levels of the distal descending colon and proximal sigmoid colon. Suspect mild and potentially chronic diverticulitis. No associated fluid. No abscess or perforation. There is no appreciable bowel wall or mesenteric thickening. No evident bowel obstruction. Terminal ileum appears normal. No evident free air or portal venous air.  Vascular/Lymphatic: No abdominal aortic aneurysm. There are foci of aortic atherosclerosis.  There is no appreciable adenopathy in the abdomen or pelvis.  Reproductive: Uterus is midline.  No pelvic masses are evident.  Other: The appendix appears normal. There is no appreciable abscess or ascites in the abdomen or pelvis. Slight fat is noted in the umbilicus.  Musculoskeletal: There is spinal stenosis at L4-5 due to diffuse disc protrusion and bony hypertrophy. There is degenerative change at multiple sites in the lower thoracic and lumbar regions. No blastic or lytic bone lesions evident. No intramuscular lesions are appreciable.  IMPRESSION: 1. 6 x 4 mm calculus in the right ureter at the mid sacral level causing mild hydronephrosis on the right. Right kidney is subtly edematous.  2. Urinary bladder wall is diffusely thickened. There is soft tissue stranding adjacent to the urinary bladder, likely of inflammatory etiology.  3. Extensive sigmoid and distal descending colonic diverticulosis. Mild mesenteric thickening at the level of the distal descending colon and proximal sigmoid colon may represent mild and potentially chronic diverticulitis. Somewhat similar changes noted on prior CT. No fluid in this area of suspected mild inflammation of the distal descending and proximal sigmoid colon. No perforation or abscess.  4. Spinal stenosis at L4-5 due to diffuse disc protrusion and bony hypertrophy.  5. Stable left adrenal  mass. This mass has been followed with stability for less than 1 year. A lesion of this nature should be followed without change for a minimum of 1 year. In this regard, a follow-up CT in 1 year to confirm stability of the left adrenal mass advised. Note that based on attenuation criteria, it is an indeterminate category.  6. No bowel obstruction. No abscess in the abdomen or pelvis. Appendix appears normal.  7.  Small hiatal hernia.   Electronically Signed By: Lowella Grip III M.D. On: 05/08/2019 11:12   Assessment & Plan:    1. Nephrolithiasis -We discussed the management of kidney stones. These options include observation, ureteroscopy, shockwave lithotripsy (ESWL) and percutaneous nephrolithotomy (PCNL). We discussed which options are relevant to the patient's stone(s). We discussed the natural history of kidney stones as well as the complications of untreated stones and the impact on quality of life without treatment as well as with each of the above listed treatments. We also discussed the efficacy of each treatment in its ability to clear the stone burden. With any of these management options I discussed the signs and symptoms of infection and the need for emergent treatment should these be experienced. For each option we discussed the ability of each procedure to clear the patient of their stone burden.   For observation I described the risks which include but are not limited to silent renal damage, life-threatening infection, need for emergent surgery, failure to pass stone and pain.   For ureteroscopy I described the risks which include bleeding, infection, damage to contiguous structures, positioning injury, ureteral stricture, ureteral avulsion, ureteral injury, need for prolonged ureteral stent, inability to perform ureteroscopy, need for an interval procedure, inability to clear stone burden, stent discomfort/pain, heart attack, stroke, pulmonary embolus and the inherent  risks with general anesthesia.   For shockwave lithotripsy I described the risks which include arrhythmia, kidney contusion, kidney hemorrhage, need for transfusion, pain, inability to adequately break up stone, inability to pass stone fragments, Steinstrasse, infection associated with obstructing stones, need for alternate surgical procedure, need for repeat shockwave lithotripsy, MI, CVA, PE and the inherent risks with anesthesia/conscious sedation.   For PCNL I described the risks including positioning injury, pneumothorax, hydrothorax, need  for chest tube, inability to clear stone burden, renal laceration, arterial venous fistula or malformation, need for embolization of kidney, loss of kidney or renal function, need for repeat procedure, need for prolonged nephrostomy tube, ureteral avulsion, MI, CVA, PE and the inherent risks of general anesthesia.   - The patient would like to proceed with left ureteroscopic stone extraction   No follow-ups on file.  Nicolette Bang, MD  [facility]

## 2020-06-18 ENCOUNTER — Encounter (HOSPITAL_COMMUNITY)
Admission: RE | Admit: 2020-06-18 | Discharge: 2020-06-18 | Disposition: A | Discharge status: Home / Self Care | Payer: Medicare HMO | Source: Ambulatory Visit | Attending: Urology | Admitting: Urology

## 2020-06-18 ENCOUNTER — Other Ambulatory Visit: Payer: Self-pay

## 2020-06-21 ENCOUNTER — Other Ambulatory Visit: Payer: Self-pay

## 2020-06-21 ENCOUNTER — Other Ambulatory Visit (HOSPITAL_COMMUNITY)
Admission: RE | Admit: 2020-06-21 | Discharge: 2020-06-21 | Disposition: A | Discharge status: Home / Self Care | Payer: Medicare HMO | Source: Ambulatory Visit | Attending: Urology | Admitting: Urology

## 2020-06-21 DIAGNOSIS — Z01812 Encounter for preprocedural laboratory examination: Secondary | ICD-10-CM | POA: Diagnosis not present

## 2020-06-21 DIAGNOSIS — Z20822 Contact with and (suspected) exposure to covid-19: Secondary | ICD-10-CM | POA: Diagnosis not present

## 2020-06-21 LAB — SARS CORONAVIRUS 2 (TAT 6-24 HRS): SARS Coronavirus 2: NEGATIVE

## 2020-06-23 ENCOUNTER — Ambulatory Visit (HOSPITAL_COMMUNITY): Payer: Medicare HMO | Admitting: Anesthesiology

## 2020-06-23 ENCOUNTER — Ambulatory Visit (HOSPITAL_COMMUNITY): Payer: Medicare HMO

## 2020-06-23 ENCOUNTER — Encounter (HOSPITAL_COMMUNITY)
Admission: RE | Disposition: A | Discharge status: Home / Self Care | Payer: Self-pay | Source: Home / Self Care | Attending: Urology

## 2020-06-23 ENCOUNTER — Ambulatory Visit (HOSPITAL_COMMUNITY)
Admission: RE | Admit: 2020-06-23 | Discharge: 2020-06-23 | Disposition: A | Discharge status: Home / Self Care | Payer: Medicare HMO | Attending: Urology | Admitting: Urology

## 2020-06-23 ENCOUNTER — Encounter (HOSPITAL_COMMUNITY): Payer: Self-pay | Admitting: Urology

## 2020-06-23 DIAGNOSIS — N201 Calculus of ureter: Secondary | ICD-10-CM | POA: Insufficient documentation

## 2020-06-23 DIAGNOSIS — Z888 Allergy status to other drugs, medicaments and biological substances status: Secondary | ICD-10-CM | POA: Diagnosis not present

## 2020-06-23 DIAGNOSIS — Z87442 Personal history of urinary calculi: Secondary | ICD-10-CM

## 2020-06-23 HISTORY — PX: CYSTOSCOPY WITH RETROGRADE PYELOGRAM, URETEROSCOPY AND STENT PLACEMENT: SHX5789

## 2020-06-23 HISTORY — PX: STONE EXTRACTION WITH BASKET: SHX5318

## 2020-06-23 SURGERY — CYSTOURETEROSCOPY, WITH RETROGRADE PYELOGRAM AND STENT INSERTION
Anesthesia: General | Laterality: Left

## 2020-06-23 MED ORDER — DIATRIZOATE MEGLUMINE 30 % UR SOLN
URETHRAL | Status: AC
  Filled 2020-06-23: qty 100

## 2020-06-23 MED ORDER — SODIUM CHLORIDE 0.9 % IV SOLN
INTRAVENOUS | Status: AC
  Filled 2020-06-23: qty 20

## 2020-06-23 MED ORDER — LIDOCAINE 2% (20 MG/ML) 5 ML SYRINGE
INTRAMUSCULAR | Status: AC
  Filled 2020-06-23: qty 5

## 2020-06-23 MED ORDER — DIATRIZOATE MEGLUMINE 30 % UR SOLN
URETHRAL | Status: DC | PRN
  Administered 2020-06-23: 8.5 mL via URETHRAL

## 2020-06-23 MED ORDER — SODIUM CHLORIDE 0.9 % IV SOLN
2.0000 g | INTRAVENOUS | Status: AC
  Administered 2020-06-23: 2 g via INTRAVENOUS

## 2020-06-23 MED ORDER — LACTATED RINGERS IV SOLN: INTRAVENOUS | Status: DC | PRN

## 2020-06-23 MED ORDER — PROPOFOL 10 MG/ML IV BOLUS
INTRAVENOUS | Status: DC | PRN
  Administered 2020-06-23: 20 mg via INTRAVENOUS
  Administered 2020-06-23: 160 mg via INTRAVENOUS

## 2020-06-23 MED ORDER — PROPOFOL 10 MG/ML IV BOLUS
INTRAVENOUS | Status: AC
  Filled 2020-06-23: qty 40

## 2020-06-23 MED ORDER — FENTANYL CITRATE (PF) 100 MCG/2ML IJ SOLN: 25.0000 ug | INTRAMUSCULAR | Status: DC | PRN

## 2020-06-23 MED ORDER — FENTANYL CITRATE (PF) 100 MCG/2ML IJ SOLN
50.0000 ug | Freq: Once | INTRAMUSCULAR | Status: AC
  Administered 2020-06-23: 50 ug via INTRAVENOUS

## 2020-06-23 MED ORDER — CHLORHEXIDINE GLUCONATE 0.12 % MT SOLN
15.0000 mL | Freq: Once | OROMUCOSAL | Status: AC
  Administered 2020-06-23: 15 mL via OROMUCOSAL

## 2020-06-23 MED ORDER — FENTANYL CITRATE (PF) 100 MCG/2ML IJ SOLN
INTRAMUSCULAR | Status: AC
  Filled 2020-06-23: qty 2

## 2020-06-23 MED ORDER — LACTATED RINGERS IV SOLN
Freq: Once | INTRAVENOUS | Status: AC
  Administered 2020-06-23: 1000 mL via INTRAVENOUS

## 2020-06-23 MED ORDER — SODIUM CHLORIDE 0.9 % IR SOLN
Status: DC | PRN
  Administered 2020-06-23: 3000 mL

## 2020-06-23 MED ORDER — FENTANYL CITRATE (PF) 100 MCG/2ML IJ SOLN
INTRAMUSCULAR | Status: DC | PRN
  Administered 2020-06-23 (×4): 25 ug via INTRAVENOUS

## 2020-06-23 MED ORDER — ONDANSETRON HCL 4 MG/2ML IJ SOLN
INTRAMUSCULAR | Status: AC
  Filled 2020-06-23: qty 2

## 2020-06-23 MED ORDER — OXYCODONE-ACETAMINOPHEN 5-325 MG PO TABS: 1.0000 | ORAL_TABLET | Freq: Four times a day (QID) | ORAL | 0 refills | Status: DC | PRN

## 2020-06-23 MED ORDER — LIDOCAINE HCL (CARDIAC) PF 50 MG/5ML IV SOSY
PREFILLED_SYRINGE | INTRAVENOUS | Status: DC | PRN
  Administered 2020-06-23: 60 mg via INTRAVENOUS

## 2020-06-23 MED ORDER — ONDANSETRON HCL 4 MG/2ML IJ SOLN: 4.0000 mg | Freq: Once | INTRAMUSCULAR | Status: DC | PRN

## 2020-06-23 MED ORDER — ONDANSETRON HCL 4 MG/2ML IJ SOLN
INTRAMUSCULAR | Status: DC | PRN
  Administered 2020-06-23: 4 mg via INTRAVENOUS

## 2020-06-23 MED ORDER — ORAL CARE MOUTH RINSE: 15.0000 mL | Freq: Once | OROMUCOSAL | Status: AC

## 2020-06-23 MED ORDER — WATER FOR IRRIGATION, STERILE IR SOLN
Status: DC | PRN
  Administered 2020-06-23: 1000 mL

## 2020-06-23 SURGICAL SUPPLY — 30 items
BAG DRAIN URO TABLE W/ADPT NS (BAG) ×3 IMPLANT
BAG DRN 8 ADPR NS SKTRN CSTL (BAG) ×2
BAG HAMPER (MISCELLANEOUS) ×3 IMPLANT
CATH INTERMIT  6FR 70CM (CATHETERS) ×3 IMPLANT
CLOTH BEACON ORANGE TIMEOUT ST (SAFETY) ×3 IMPLANT
DECANTER SPIKE VIAL GLASS SM (MISCELLANEOUS) ×3 IMPLANT
EXTRACTOR STONE NITINOL NGAGE (UROLOGICAL SUPPLIES) ×1 IMPLANT
GLOVE BIO SURGEON STRL SZ8 (GLOVE) ×3 IMPLANT
GLOVE BIOGEL PI IND STRL 7.0 (GLOVE) ×6 IMPLANT
GLOVE BIOGEL PI IND STRL 7.5 (GLOVE) IMPLANT
GLOVE BIOGEL PI INDICATOR 7.0 (GLOVE) ×3
GLOVE BIOGEL PI INDICATOR 7.5 (GLOVE) ×1
GLOVE ECLIPSE 7.0 STRL STRAW (GLOVE) ×1 IMPLANT
GOWN STRL REUS W/TWL LRG LVL3 (GOWN DISPOSABLE) ×3 IMPLANT
GOWN STRL REUS W/TWL XL LVL3 (GOWN DISPOSABLE) ×3 IMPLANT
GUIDEWIRE ANG ZIPWIRE 038X150 (WIRE) ×1 IMPLANT
GUIDEWIRE STR DUAL SENSOR (WIRE) ×3 IMPLANT
GUIDEWIRE STR ZIPWIRE 035X150 (MISCELLANEOUS) ×3 IMPLANT
IV NS IRRIG 3000ML ARTHROMATIC (IV SOLUTION) ×6 IMPLANT
KIT TURNOVER CYSTO (KITS) ×3 IMPLANT
MANIFOLD NEPTUNE II (INSTRUMENTS) ×3 IMPLANT
PACK CYSTO (CUSTOM PROCEDURE TRAY) ×3 IMPLANT
PAD ARMBOARD 7.5X6 YLW CONV (MISCELLANEOUS) ×3 IMPLANT
STENT URET 6FRX24 CONTOUR (STENTS) ×1 IMPLANT
STENT URET 6FRX26 CONTOUR (STENTS) ×3 IMPLANT
SYR 10ML LL (SYRINGE) ×3 IMPLANT
TOWEL OR 17X26 4PK STRL BLUE (TOWEL DISPOSABLE) ×3 IMPLANT
TRACTIP FLEXIVA PULS ID 200XHI (Laser) ×2 IMPLANT
TRACTIP FLEXIVA PULSE ID 200 (Laser) ×3
WATER STERILE IRR 500ML POUR (IV SOLUTION) ×3 IMPLANT

## 2020-06-23 NOTE — Anesthesia Procedure Notes (Signed)
Procedure Name: LMA Insertion Date/Time: 06/23/2020 8:01 AM Performed by: Ollen Bowl, CRNA Pre-anesthesia Checklist: Patient identified, Patient being monitored, Emergency Drugs available, Timeout performed and Suction available Patient Re-evaluated:Patient Re-evaluated prior to induction Oxygen Delivery Method: Circle System Utilized Preoxygenation: Pre-oxygenation with 100% oxygen Induction Type: IV induction Ventilation: Mask ventilation without difficulty LMA: LMA inserted LMA Size: 3.0 Number of attempts: 1 Placement Confirmation: positive ETCO2 and breath sounds checked- equal and bilateral

## 2020-06-23 NOTE — Anesthesia Postprocedure Evaluation (Signed)
Anesthesia Post Note  Patient: KIYAH DEMARTINI  Procedure(s) Performed: CYSTOSCOPY WITH LEFT RETROGRADE PYELOGRAM, LEFT URETEROSCOPY AND STENT EXCHANGE (Left ) LEFT URETERAL STONE EXTRACTION WITH BASKET  Patient location during evaluation: PACU Anesthesia Type: General Level of consciousness: awake and alert and oriented Pain management: pain level controlled Vital Signs Assessment: post-procedure vital signs reviewed and stable Respiratory status: spontaneous breathing Cardiovascular status: blood pressure returned to baseline and stable Postop Assessment: no apparent nausea or vomiting Anesthetic complications: no   No complications documented.   Last Vitals:  Vitals:   06/23/20 0902 06/23/20 0911  BP:  (!) 150/62  Pulse: 84 89  Resp: 15 18  Temp:  36.9 C  SpO2: 97% 98%    Last Pain:  Vitals:   06/23/20 0911  TempSrc: Oral  PainSc: 4                  Austina Constantin

## 2020-06-23 NOTE — Op Note (Signed)
.  Preoperative diagnosis: Left ureteral stone  Postoperative diagnosis: Same  Procedure: 1 cystoscopy 2. Left retrograde pyelography 3.  Intraoperative fluoroscopy, under one hour, with interpretation 4.  Left ureteroscopic stone manipulation with basket extraction 5.  Left 6 x 24 JJ stent exchange  Attending: Rosie Fate  Anesthesia: General  Estimated blood loss: None  Drains: Left 6 x 24 JJ ureteral stent with tether  Specimens: stone for analysis  Antibiotics: rocephin  Findings: left mid ureteral stone. No hydronephrosis. No masses/lesions in the bladder. Ureteral orifices in normal anatomic location. Stone was very soft and broke when grasped with the basket. The fragments were removed.   Indications: Patient is a 74 year old female with a history of left ureteral stone and who underwent stent placement 3 weeks ago for sepsis  After discussing treatment options, she decided proceed with left ureteroscopic stone manipulation.  Procedure her in detail: The patient was brought to the operating room and a brief timeout was done to ensure correct patient, correct procedure, correct site.  General anesthesia was administered patient was placed in dorsal lithotomy position.  Her genitalia was then prepped and draped in usual sterile fashion.  A rigid 90 French cystoscope was passed in the urethra and the bladder.  Bladder was inspected free masses or lesions.  the ureteral orifices were in the normal orthotopic locations.  a 6 french ureteral catheter was then instilled into the left ureteral orifice.  a gentle retrograde was obtained and findings noted above.  we then placed a zip wire through the ureteral catheter and advanced up to the renal pelvis.  we then removed the cystoscope and cannulated the left ureteral orifice with a semirigid ureteroscope.  We encountered the stone in the ureter. Using an NGage basket we attmepted to remove the calculus and the stone fragmented.    the  fragments were then removed with a Ngage basket.   We then placed a 6 x 24 double-j ureteral stent over the original zip wire.  We then removed the wire and good coil was noted in the the renal pelvis under fluoroscopy and the bladder under direct vision. the bladder was then drained and this concluded the procedure which was well tolerated by patient.  Complications: None  Condition: Stable, extubated, transferred to PACU  Plan: Patient is to be discharged home as to follow-up in one week f

## 2020-06-23 NOTE — Transfer of Care (Signed)
Immediate Anesthesia Transfer of Care Note  Patient: Rhonda Bailey  Procedure(s) Performed: CYSTOSCOPY WITH LEFT RETROGRADE PYELOGRAM, LEFT URETEROSCOPY AND STENT EXCHANGE (Left )  Patient Location: PACU  Anesthesia Type:General  Level of Consciousness: awake  Airway & Oxygen Therapy: Patient Spontanous Breathing  Post-op Assessment: Report given to RN  Post vital signs: Reviewed  Last Vitals:  Vitals Value Taken Time  BP 143/66 06/23/20 0841  Temp    Pulse 85 06/23/20 0844  Resp 14 06/23/20 0844  SpO2 95 % 06/23/20 0844  Vitals shown include unvalidated device data.  Last Pain:  Vitals:   06/23/20 0640  TempSrc: Oral  PainSc: 8       Patients Stated Pain Goal: 9 (11/65/46 1243)  Complications: No complications documented.

## 2020-06-23 NOTE — Discharge Instructions (Signed)
Ureteral Stent Implantation, Care After This sheet gives you information about how to care for yourself after your procedure. Your health care provider may also give you more specific instructions. If you have problems or questions, contact your health care provider. What can I expect after the procedure? After the procedure, it is common to have:  Nausea.  Mild pain when you urinate. You may feel this pain in your lower back or lower abdomen. The pain should stop within a few minutes after you urinate. This may last for up to 1 week.  A small amount of blood in your urine for several days. Follow these instructions at home: Medicines  Take over-the-counter and prescription medicines only as told by your health care provider.  If you were prescribed an antibiotic medicine, take it as told by your health care provider. Do not stop taking the antibiotic even if you start to feel better.  Do not drive for 24 hours if you were given a sedative during your procedure.  Ask your health care provider if the medicine prescribed to you requires you to avoid driving or using heavy machinery. Activity  Rest as told by your health care provider.  Avoid sitting for a long time without moving. Get up to take short walks every 1-2 hours. This is important to improve blood flow and breathing. Ask for help if you feel weak or unsteady.  Return to your normal activities as told by your health care provider. Ask your health care provider what activities are safe for you. General instructions   Watch for any blood in your urine. Call your health care provider if the amount of blood in your urine increases.  If you have a catheter: ? Follow instructions from your health care provider about taking care of your catheter and collection bag. ? Do not take baths, swim, or use a hot tub until your health care provider approves. Ask your health care provider if you may take showers. You may only be allowed to  take sponge baths.  Drink enough fluid to keep your urine pale yellow.  Do not use any products that contain nicotine or tobacco, such as cigarettes, e-cigarettes, and chewing tobacco. These can delay healing after surgery. If you need help quitting, ask your health care provider.  Keep all follow-up visits as told by your health care provider. This is important. Contact a health care provider if:  You have pain that gets worse or does not get better with medicine, especially pain when you urinate.  You have difficulty urinating.  You feel nauseous or you vomit repeatedly during a period of more than 2 days after the procedure. Get help right away if:  Your urine is dark red or has blood clots in it.  You are leaking urine (have incontinence).  The end of the stent comes out of your urethra.  You cannot urinate.  You have sudden, sharp, or severe pain in your abdomen or lower back.  You have a fever.  You have swelling or pain in your legs.  You have difficulty breathing. Summary  After the procedure, it is common to have mild pain when you urinate that goes away within a few minutes after you urinate. This may last for up to 1 week.  Watch for any blood in your urine. Call your health care provider if the amount of blood in your urine increases.  Take over-the-counter and prescription medicines only as told by your health care provider.  Drink  enough fluid to keep your urine pale yellow. This information is not intended to replace advice given to you by your health care provider. Make sure you discuss any questions you have with your health care provider. Document Revised: 04/30/2018 Document Reviewed: 05/01/2018 Elsevier Patient Education  2020 [place] STENT IN 72 HOURS BY GENTLY PULLING THE STRING      General Anesthesia, Adult, Care After This sheet gives you information about how to care for yourself after your procedure. Your health  care provider may also give you more specific instructions. If you have problems or questions, contact your health care provider. What can I expect after the procedure? After the procedure, the following side effects are common:  Pain or discomfort at the IV site.  Nausea.  Vomiting.  Sore throat.  Trouble concentrating.  Feeling cold or chills.  Weak or tired.  Sleepiness and fatigue.  Soreness and body aches. These side effects can affect parts of the body that were not involved in surgery. Follow these instructions at home:  For at least 24 hours after the procedure:  Have a responsible adult stay with you. It is important to have someone help care for you until you are awake and alert.  Rest as needed.  Do not: ? Participate in activities in which you could fall or become injured. ? Drive. ? Use heavy machinery. ? Drink alcohol. ? Take sleeping pills or medicines that cause drowsiness. ? Make important decisions or sign legal documents. ? Take care of children on your own. Eating and drinking  Follow any instructions from your health care provider about eating or drinking restrictions.  When you feel hungry, start by eating small amounts of foods that are soft and easy to digest (bland), such as toast. Gradually return to your regular diet.  Drink enough fluid to keep your urine pale yellow.  If you vomit, rehydrate by drinking water, juice, or clear broth. General instructions  If you have sleep apnea, surgery and certain medicines can increase your risk for breathing problems. Follow instructions from your health care provider about wearing your sleep device: ? Anytime you are sleeping, including during daytime naps. ? While taking prescription pain medicines, sleeping medicines, or medicines that make you drowsy.  Return to your normal activities as told by your health care provider. Ask your health care provider what activities are safe for you.  Take  over-the-counter and prescription medicines only as told by your health care provider.  If you smoke, do not smoke without supervision.  Keep all follow-up visits as told by your health care provider. This is important. Contact a health care provider if:  You have nausea or vomiting that does not get better with medicine.  You cannot eat or drink without vomiting.  You have pain that does not get better with medicine.  You are unable to pass urine.  You develop a skin rash.  You have a fever.  You have redness around your IV site that gets worse. Get help right away if:  You have difficulty breathing.  You have chest pain.  You have blood in your urine or stool, or you vomit blood. Summary  After the procedure, it is common to have a sore throat or nausea. It is also common to feel tired.  Have a responsible adult stay with you for the first 24 hours after general anesthesia. It is important to have someone help care for you until you are awake  and alert.  When you feel hungry, start by eating small amounts of foods that are soft and easy to digest (bland), such as toast. Gradually return to your regular diet.  Drink enough fluid to keep your urine pale yellow.  Return to your normal activities as told by your health care provider. Ask your health care provider what activities are safe for you. This information is not intended to replace advice given to you by your health care provider. Make sure you discuss any questions you have with your health care provider. Document Revised: 07/27/2017 Document Reviewed: 03/09/2017 Elsevier Patient Education  [place].

## 2020-06-23 NOTE — Interval H&P Note (Signed)
History and Physical Interval Note:  06/23/2020 7:50 AM  Joellyn Rued  has presented today for surgery, with the diagnosis of left ureteral calculus.  The various methods of treatment have been discussed with the patient and family. After consideration of risks, benefits and other options for treatment, the patient has consented to  Procedure(s): CYSTOSCOPY WITH LEFT RETROGRADE PYELOGRAM, LEFT URETEROSCOPY AND STENT EXCHANGE (Left) HOLMIUM LASER APPLICATION LEFT URETERAL CALCULUS (Left) as a surgical intervention.  The patient's history has been reviewed, patient examined, no change in status, stable for surgery.  I have reviewed the patient's chart and labs.  Questions were answered to the patient's satisfaction.     Nicolette Bang

## 2020-06-23 NOTE — Anesthesia Preprocedure Evaluation (Addendum)
Anesthesia Evaluation  Patient identified by MRN, date of birth, ID band Patient awake    Reviewed: Allergy & Precautions, NPO status , Patient's Chart, lab work & pertinent test results  Airway Mallampati: II  TM Distance: >3 FB Neck ROM: Full    Dental  (+) Dental Advisory Given Permanent bridges :   Pulmonary sleep apnea ,    Pulmonary exam normal breath sounds clear to auscultation       Cardiovascular Exercise Tolerance: Good hypertension, Pt. on medications Normal cardiovascular exam Rhythm:Regular Rate:Normal  17-May-2020 09:55:27 [facility] ROUTINE RECORD Sinus tachycardia Prolonged QT interval No significant change since last tracing Confirmed by Calvert Cantor [phone]) on 05/17/2020 9:58:16 AM   Neuro/Psych PSYCHIATRIC DISORDERS Anxiety negative neurological ROS     GI/Hepatic PUD, GERD  Medicated,  Endo/Other  Hypothyroidism   Renal/GU Renal InsufficiencyRenal disease     Musculoskeletal   Abdominal   Peds  Hematology  (+) anemia ,   Anesthesia Other Findings   Reproductive/Obstetrics                            Anesthesia Physical Anesthesia Plan  ASA: II  Anesthesia Plan: General   Post-op Pain Management:    Induction: Intravenous  PONV Risk Score and Plan: 4 or greater and Ondansetron  Airway Management Planned: LMA and Oral ETT  Additional Equipment:   Intra-op Plan:   Post-operative Plan: Extubation in OR  Informed Consent: I have reviewed the patients History and Physical, chart, labs and discussed the procedure including the risks, benefits and alternatives for the proposed anesthesia with the patient or authorized representative who has indicated his/her understanding and acceptance.     Dental advisory given  Plan Discussed with: CRNA and Surgeon  Anesthesia Plan Comments:         Anesthesia Quick Evaluation

## 2020-06-25 ENCOUNTER — Other Ambulatory Visit: Payer: Self-pay

## 2020-06-25 ENCOUNTER — Ambulatory Visit (INDEPENDENT_AMBULATORY_CARE_PROVIDER_SITE_OTHER): Payer: Medicare HMO

## 2020-06-25 ENCOUNTER — Other Ambulatory Visit: Payer: Self-pay | Admitting: Family

## 2020-06-25 DIAGNOSIS — N200 Calculus of kidney: Secondary | ICD-10-CM

## 2020-06-25 DIAGNOSIS — D649 Anemia, unspecified: Secondary | ICD-10-CM

## 2020-06-25 DIAGNOSIS — D500 Iron deficiency anemia secondary to blood loss (chronic): Secondary | ICD-10-CM

## 2020-06-25 NOTE — Progress Notes (Signed)
Pt came in today to have tethered post op stent removed. Tape removed from string. Stent pulled completely out without any difficulty. Pt tolerated procedure no difficulties.  Pt will keep scheduled post op appt on 06/30/2020

## 2020-06-28 ENCOUNTER — Encounter: Payer: Self-pay | Admitting: Family

## 2020-06-28 ENCOUNTER — Telehealth: Payer: Self-pay

## 2020-06-28 ENCOUNTER — Other Ambulatory Visit: Payer: Self-pay

## 2020-06-28 ENCOUNTER — Inpatient Hospital Stay (HOSPITAL_BASED_OUTPATIENT_CLINIC_OR_DEPARTMENT_OTHER): Payer: Medicare HMO | Admitting: Family

## 2020-06-28 ENCOUNTER — Other Ambulatory Visit: Payer: Self-pay | Admitting: Family

## 2020-06-28 ENCOUNTER — Inpatient Hospital Stay: Payer: Medicare HMO | Attending: Family

## 2020-06-28 ENCOUNTER — Telehealth: Payer: Self-pay | Admitting: *Deleted

## 2020-06-28 VITALS — BP 151/70 | HR 96 | Temp 97.7°F | Resp 18 | Ht 65.0 in | Wt 155.0 lb

## 2020-06-28 DIAGNOSIS — Z79899 Other long term (current) drug therapy: Secondary | ICD-10-CM | POA: Insufficient documentation

## 2020-06-28 DIAGNOSIS — D649 Anemia, unspecified: Secondary | ICD-10-CM

## 2020-06-28 DIAGNOSIS — M545 Low back pain, unspecified: Secondary | ICD-10-CM

## 2020-06-28 DIAGNOSIS — D500 Iron deficiency anemia secondary to blood loss (chronic): Secondary | ICD-10-CM | POA: Diagnosis not present

## 2020-06-28 DIAGNOSIS — Z808 Family history of malignant neoplasm of other organs or systems: Secondary | ICD-10-CM

## 2020-06-28 DIAGNOSIS — R197 Diarrhea, unspecified: Secondary | ICD-10-CM | POA: Insufficient documentation

## 2020-06-28 DIAGNOSIS — R109 Unspecified abdominal pain: Secondary | ICD-10-CM

## 2020-06-28 DIAGNOSIS — K589 Irritable bowel syndrome without diarrhea: Secondary | ICD-10-CM | POA: Diagnosis not present

## 2020-06-28 LAB — CBC WITH DIFFERENTIAL (CANCER CENTER ONLY)
Abs Immature Granulocytes: 0.15 10*3/uL — ABNORMAL HIGH (ref 0.00–0.07)
Basophils Absolute: 0.1 10*3/uL (ref 0.0–0.1)
Basophils Relative: 1 %
Eosinophils Absolute: 0.2 10*3/uL (ref 0.0–0.5)
Eosinophils Relative: 1 %
HCT: 34.7 % — ABNORMAL LOW (ref 36.0–46.0)
Hemoglobin: 10.2 g/dL — ABNORMAL LOW (ref 12.0–15.0)
Immature Granulocytes: 1 %
Lymphocytes Relative: 19 %
Lymphs Abs: 2.3 10*3/uL (ref 0.7–4.0)
MCH: 23.6 pg — ABNORMAL LOW (ref 26.0–34.0)
MCHC: 29.4 g/dL — ABNORMAL LOW (ref 30.0–36.0)
MCV: 80.1 fL (ref 80.0–100.0)
Monocytes Absolute: 0.6 10*3/uL (ref 0.1–1.0)
Monocytes Relative: 5 %
Neutro Abs: 8.6 10*3/uL — ABNORMAL HIGH (ref 1.7–7.7)
Neutrophils Relative %: 73 %
Platelet Count: 447 10*3/uL — ABNORMAL HIGH (ref 150–400)
RBC: 4.33 MIL/uL (ref 3.87–5.11)
RDW: 19.2 % — ABNORMAL HIGH (ref 11.5–15.5)
WBC Count: 11.9 10*3/uL — ABNORMAL HIGH (ref 4.0–10.5)
nRBC: 0 % (ref 0.0–0.2)

## 2020-06-28 LAB — RETICULOCYTES
Immature Retic Fract: 23 % — ABNORMAL HIGH (ref 2.3–15.9)
RBC.: 4.34 MIL/uL (ref 3.87–5.11)
Retic Count, Absolute: 62.1 10*3/uL (ref 19.0–186.0)
Retic Ct Pct: 1.4 % (ref 0.4–3.1)

## 2020-06-28 LAB — CMP (CANCER CENTER ONLY)
ALT: 4 U/L (ref 0–44)
AST: 7 U/L — ABNORMAL LOW (ref 15–41)
Albumin: 3.8 g/dL (ref 3.5–5.0)
Alkaline Phosphatase: 87 U/L (ref 38–126)
Anion gap: 13 (ref 5–15)
BUN: 13 mg/dL (ref 8–23)
CO2: 28 mmol/L (ref 22–32)
Calcium: 9.7 mg/dL (ref 8.9–10.3)
Chloride: 104 mmol/L (ref 98–111)
Creatinine: 0.98 mg/dL (ref 0.44–1.00)
GFR, Estimated: >60 mL/min (ref >60)
Glucose, Bld: 188 mg/dL — ABNORMAL HIGH (ref 70–99)
Potassium: 2.5 mmol/L — CL (ref 3.5–5.1)
Sodium: 145 mmol/L (ref 135–145)
Total Bilirubin: 0.3 mg/dL (ref 0.3–1.2)
Total Protein: 7.3 g/dL (ref 6.5–8.1)

## 2020-06-28 LAB — LACTATE DEHYDROGENASE: LDH: 130 U/L (ref 98–192)

## 2020-06-28 LAB — IRON AND TIBC
Iron: 14 ug/dL — ABNORMAL LOW (ref 41–142)
Saturation Ratios: 6 % — ABNORMAL LOW (ref 21–57)
TIBC: 245 ug/dL (ref 236–444)
UIBC: 231 ug/dL (ref 120–384)

## 2020-06-28 LAB — SAVE SMEAR(SSMR), FOR PROVIDER SLIDE REVIEW

## 2020-06-28 LAB — FERRITIN: Ferritin: 111 ng/mL (ref 11–307)

## 2020-06-28 NOTE — Telephone Encounter (Signed)
S/w pts daughter and she is aware of the appts per 06/28/20 los and [employer]..... AOM

## 2020-06-28 NOTE — Telephone Encounter (Signed)
No 06/28/20 LOS.... AOM

## 2020-06-28 NOTE — Telephone Encounter (Signed)
Critical Potassium reported by lab.  Patient seeing [name] NP today in office.  Notified of results.

## 2020-06-28 NOTE — Progress Notes (Signed)
Hematology/Oncology Consultation   Name: Rhonda Bailey      MRN: [id number]    Location: Room/bed info not found  Date: 06/28/2020 Time:9:43 AM   REFERRING PHYSICIAN:  Irving Copas., MD  REASON FOR CONSULT: Iron deficiency anemia due to chronic blood loss   DIAGNOSIS: Iron deficiency anemia due to chronic blood loss with IBS (Crohn's vs. UC)  HISTORY OF PRESENT ILLNESS: Rhonda Bailey is a very pleasant 74 yo caucasian female with iron deficiency secondary to chronic blood loss with IBS (Crohn's vs. UC).  She developed antibodies th Humira and is currently waiting to find out the cost between Remicade and Entyvio.  She has not seen any obvious blood in her stool. No abnormal bruising or petechiae.  She has episodes of low "belly pain" with flares which cause diarrhea.  No family history of anemia.  She denies any personal history of cancer. Her father had an unknown primary and daughter has history of sarcoma.  She has history of nephrolithiasis and stent placement. She had a left ureteral stone extraction last week on Wednesday and then had her stent removed Friday. She is having a lot of back pain (9/10 at this time) and is quite uncomfortable sitting. I encouraged her to contact her urologist and let them know how she is feeling. She states that she is emptying her bladder without difficulty.  She has a follow-up appointment with urology on Wednesday.  No fever, chills, n/v, cough, rash, dizziness, SOB, chest pain, palpitations or changes in bowel habits.  No swelling, tenderness, numbness or tingling in her extremities. No diabetes or thyroid disease.   No falls or syncope. She ambulates with a Rolator for added support.  No smoking, ETOH or recreational drug use.  She has a good appetite and is doing her best to stay well hydrated. Her weight is stable.  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   ROS: All other 10 point review of systems is negative.    PAST MEDICAL HISTORY:   Past Medical History:  Diagnosis Date  . Anal fissure   . Anxiety   . Anxiety disorder, unspecified   . Essential (primary) hypertension   . Gastro-esophageal reflux disease with esophagitis   . GERD (gastroesophageal reflux disease)   . Hypokalemia   . Hypothyroidism   . Kidney stone   . Noninfective gastroenteritis and colitis, unspecified   . Sleep apnea     ALLERGIES: Allergies  Allergen Reactions  . Tramadol     Says "blew my head up."      MEDICATIONS:  Current Outpatient Medications on File Prior to Visit  Medication Sig Dispense Refill  . acidophilus (RISAQUAD) CAPS capsule Take 1 capsule by mouth daily.    Marland Kitchen ALPRAZolam (XANAX) 0.5 MG tablet Take 0.5 mg by mouth at bedtime as needed for sleep.    Marland Kitchen amLODipine (NORVASC) 2.5 MG tablet Take 1 tablet (2.5 mg total) by mouth daily as needed (high BP).    . budesonide (ENTOCORT EC) 3 MG 24 hr capsule Take 3 capsules (9 mg total) by mouth daily. 90 capsule 0  . levothyroxine (SYNTHROID) 25 MCG tablet Take 25 mcg by mouth daily before breakfast.    . mesalamine (LIALDA) 1.2 g EC tablet TAKE 4 TABLETS BY MOUTH DAILY WITH BREAKFAST. (Patient taking differently: Take 4.8 g by mouth daily with breakfast. ) 120 tablet 2  . mirabegron ER (MYRBETRIQ) 25 MG TB24 tablet Take 1 tablet (25 mg  total) by mouth daily. 30 tablet 0  . Multiple Vitamin (MULTIVITAMIN WITH MINERALS) TABS tablet Take 1 tablet by mouth daily.    Marland Kitchen oxyCODONE-acetaminophen (PERCOCET/ROXICET) 5-325 MG tablet Take 1 tablet by mouth every 6 (six) hours as needed for moderate pain or severe pain. 30 tablet 0  . pantoprazole (PROTONIX) 40 MG tablet Take 1 tablet (40 mg total) by mouth 2 (two) times daily before a meal. (Patient taking differently: Take 40 mg by mouth daily. ) 60 tablet 1  . tamsulosin (FLOMAX) 0.4 MG CAPS capsule Take 1 capsule (0.4 mg total) by mouth daily. 30 capsule 1   No current facility-administered medications on file  prior to visit.     PAST SURGICAL HISTORY Past Surgical History:  Procedure Laterality Date  . BIOPSY  09/02/2018   Procedure: BIOPSY;  Surgeon: Daneil Dolin, MD;  [facility]: [facility];  Service: Endoscopy;;  colon   . CHOLECYSTECTOMY  05/19/2011   Procedure: LAPAROSCOPIC CHOLECYSTECTOMY;  Surgeon: Jamesetta So;  Location: [facility];  Service: General;  Laterality: N/A;  . COLONOSCOPY  11/2019   Dr. [name]:  rectal ulceration, TI and IC valve appeared normal, 8 mm polyp in proximal ascending colon that was sessile. Inflammation in transverse colon to cecum, inflammation with altered vascularity, edema, erosions, erythema, friability, shallow ulcerations in a continuous and circumferential pattern from rectum to splenix flexure, single twenty mm ulcer in distal rectum. Small and large-  . COLONOSCOPY WITH PROPOFOL N/A 09/02/2018   Dr. Gala Romney: Proctocolitis limited to the left side with some exudate versus partial pseudomembrane formation, erosions and ulceration somewhat superficial.  Terminal ileum normal.  Diverticulosis.  Descending/sigmoid colon biopsy showing severe active colitis, diagnostic features of IBD not identified.  Differential includes infection, ischemia.  Rectal biopsies with granulation tissue c/w ulcer  . CYSTOSCOPY W/ URETERAL STENT PLACEMENT Right 05/08/2019   Procedure: CYSTOSCOPY WITH RETROGRADE PYELOGRAM/URETERAL STENT PLACEMENT;  Surgeon: Festus Aloe, MD;  Location: [facility];  Service: Urology;  Laterality: Right;  . CYSTOSCOPY W/ URETERAL STENT PLACEMENT Left 05/17/2020   Procedure: CYSTOSCOPY WITH RETROGRADE PYELOGRAM/URETERAL STENT PLACEMENT;  Surgeon: Robley Fries, MD;  Location: [facility];  Service: Urology;  Laterality: Left;  . CYSTOSCOPY WITH RETROGRADE PYELOGRAM, URETEROSCOPY AND STENT PLACEMENT Left 06/23/2020   Procedure: CYSTOSCOPY WITH LEFT RETROGRADE PYELOGRAM, LEFT URETEROSCOPY AND STENT EXCHANGE;  Surgeon: Cleon Gustin, MD;  Location: [facility];  Service: Urology;  Laterality: Left;  . CYSTOSCOPY WITH URETEROSCOPY Right 06/11/2019   Procedure: CYSTOSCOPY WITH DIAGNOSTIC URETEROSCOPY; URETERAL STENT REMOVAL;  Surgeon: Cleon Gustin, MD;  Location: [facility];  Service: Urology;  Laterality: Right;  . HEMORRHOID SURGERY N/A 09/16/2019   Procedure: EXTENSIVE HEMORRHOIDECTOMY;  Surgeon: Aviva Signs, MD;  Location: [facility];  Service: General;  Laterality: N/A;  . RECTAL BIOPSY N/A 09/16/2019   Procedure: BIOPSY RECTAL;  Surgeon: Aviva Signs, MD;  Location: [facility];  Service: General;  Laterality: N/A;  . STONE EXTRACTION WITH BASKET  06/23/2020   Procedure: LEFT URETERAL STONE EXTRACTION WITH BASKET;  Surgeon: Cleon Gustin, MD;  Location: [facility];  Service: Urology;;  . TUBAL LIGATION      FAMILY HISTORY: Family History  Problem Relation Age of Onset  . Cancer Father        mouth  . Heart disease Sister   . Heart disease Brother   . Cancer Brother        head  . Cancer Daughter        ?  type  . Anesthesia problems Neg Hx   . Hypotension Neg Hx   . Malignant hyperthermia Neg Hx   . Pseudochol deficiency Neg Hx   . Colon cancer Neg Hx   . Esophageal cancer Neg Hx   . Inflammatory bowel disease Neg Hx   . Liver disease Neg Hx   . Pancreatic cancer Neg Hx   . Rectal cancer Neg Hx   . Stomach cancer Neg Hx     SOCIAL HISTORY:  reports that she has never smoked. She has never used smokeless tobacco. She reports previous alcohol use. She reports that she does not use drugs.  PERFORMANCE STATUS: The patient's performance status is 1 - Symptomatic but completely ambulatory  PHYSICAL EXAM: Most Recent Vital Signs: Blood pressure (!) 151/70, pulse 96, temperature 97.7 F (36.5 C), temperature source Oral, resp. rate 18, height 5\' 5"  (1.651 m), weight 155 lb (70.3 kg), SpO2 100 %. BP (!) 151/70 (BP Location: Left Arm, Patient Position: Sitting)   Pulse 96   Temp 97.7 F (36.5 C) (Oral)   Resp 18   Ht 5\' 5"  (1.651  m)   Wt 155 lb (70.3 kg)   SpO2 100%   BMI 25.79 kg/m   General Appearance:    Alert, cooperative, no distress, appears stated age  Head:    Normocephalic, without obvious abnormality, atraumatic  Eyes:    PERRL, conjunctiva/corneas clear, EOM's intact, fundi    benign, both eyes        Throat:   Lips, mucosa, and tongue normal; teeth and gums normal  Neck:   Supple, symmetrical, trachea midline, no adenopathy;    thyroid:  no enlargement/tenderness/nodules; no carotid   bruit or JVD  Back:     Symmetric, no curvature, ROM normal, no CVA tenderness  Lungs:     Clear to auscultation bilaterally, respirations unlabored  Chest Wall:    No tenderness or deformity   Heart:    Regular rate and rhythm, S1 and S2 normal, no murmur, rub   or gallop     Abdomen:     Soft, non-tender, bowel sounds active all four quadrants,    no masses, no organomegaly        Extremities:   Extremities normal, atraumatic, no cyanosis or edema  Pulses:   2+ and symmetric all extremities  Skin:   Skin color, texture, turgor normal, no rashes or lesions  Lymph nodes:   Cervical, supraclavicular, and axillary nodes normal  Neurologic:   CNII-XII intact, normal strength, sensation and reflexes    throughout    LABORATORY DATA:  Results for orders placed or performed in visit on 06/28/20 (from the past 48 hour(s))  Reticulocytes     Status: Abnormal   Collection Time: 06/28/20  8:41 AM  Result Value Ref Range   Retic Ct Pct 1.4 0.4 - 3.1 %   RBC. 4.34 3.87 - 5.11 MIL/uL   Retic Count, Absolute 62.1 19.0 - 186.0 K/uL   Immature Retic Fract 23.0 (H) 2.3 - 15.9 %    Comment: Performed at [facility] at [facility], [address] [place], [place] [address]  CBC with Differential (Cancer Center Only)     Status: Abnormal (Preliminary result)   Collection Time: 06/28/20  8:41 AM  Result Value Ref Range   WBC Count 11.9 (H) 4.0 - 10.5 K/uL   RBC 4.33 3.87 - 5.11 MIL/uL    Hemoglobin 10.2 (L) 12.0 - 15.0 g/dL  HCT 34.7 (L) 36 - 46 %   MCV 80.1 80.0 - 100.0 fL   MCH 23.6 (L) 26.0 - 34.0 pg   MCHC 29.4 (L) 30.0 - 36.0 g/dL   RDW 19.2 (H) 11.5 - 15.5 %   Platelet Count 447 (H) 150 - 400 K/uL   nRBC 0.0 0.0 - 0.2 %    Comment: Performed at [facility] Lab at [facility], [address] [place], [place] [address]   Neutrophils Relative % PENDING %   Neutro Abs PENDING 1.7 - 7.7 K/uL   Band Neutrophils PENDING %   Lymphocytes Relative PENDING %   Lymphs Abs PENDING 0.7 - 4.0 K/uL   Monocytes Relative PENDING %   Monocytes Absolute PENDING 0.1 - 1.0 K/uL   Eosinophils Relative PENDING %   Eosinophils Absolute PENDING 0.0 - 0.5 K/uL   Basophils Relative PENDING %   Basophils Absolute PENDING 0.0 - 0.1 K/uL   WBC Morphology PENDING    RBC Morphology PENDING    Smear Review PENDING    Other PENDING %   nRBC PENDING 0 /100 WBC   Metamyelocytes Relative PENDING %   Myelocytes PENDING %   Promyelocytes Relative PENDING %   Blasts PENDING %   Immature Granulocytes PENDING %   Abs Immature Granulocytes PENDING 0.00 - 0.07 K/uL      RADIOGRAPHY: No results found.     PATHOLOGY: None    ASSESSMENT/PLAN: Ms. Laguardia is a very pleasant 74 yo caucasian female with iron deficiency secondary to chronic blood loss with IBS (Crohn's vs. UC).  Anemia panel is pending. We will get her set up for IV iron if needed.  Follow-up in 8 weeks.  She states that she will contact urology today to let them know about her persistent pain since her procedure.   All questions were answered and she is in agreement with the plan. She can contact our office with any questions or concerns. We can certainly see her sooner if needed.    [name], NP

## 2020-06-29 ENCOUNTER — Telehealth: Payer: Self-pay

## 2020-06-29 LAB — CALCULI, WITH PHOTOGRAPH (CLINICAL LAB)
Hydroxyapatite: 100 %
Weight Calculi: 5 mg

## 2020-06-29 LAB — ERYTHROPOIETIN: Erythropoietin: 50.2 m[IU]/mL — ABNORMAL HIGH (ref 2.6–18.5)

## 2020-06-30 ENCOUNTER — Telehealth: Payer: Self-pay

## 2020-06-30 ENCOUNTER — Encounter: Payer: Self-pay | Admitting: Urology

## 2020-06-30 ENCOUNTER — Other Ambulatory Visit: Payer: Self-pay

## 2020-06-30 ENCOUNTER — Ambulatory Visit (INDEPENDENT_AMBULATORY_CARE_PROVIDER_SITE_OTHER): Payer: Medicare HMO | Admitting: Urology

## 2020-06-30 VITALS — BP 140/57 | HR 103 | Temp 98.0°F | Ht 65.0 in | Wt 155.0 lb

## 2020-06-30 DIAGNOSIS — N200 Calculus of kidney: Secondary | ICD-10-CM | POA: Diagnosis not present

## 2020-06-30 DIAGNOSIS — R300 Dysuria: Secondary | ICD-10-CM | POA: Diagnosis not present

## 2020-06-30 DIAGNOSIS — N3000 Acute cystitis without hematuria: Secondary | ICD-10-CM | POA: Diagnosis not present

## 2020-06-30 MED ORDER — NITROFURANTOIN MONOHYD MACRO 100 MG PO CAPS: 100.0000 mg | ORAL_CAPSULE | Freq: Two times a day (BID) | ORAL | 0 refills | Status: DC

## 2020-06-30 MED ORDER — SODIUM BICARBONATE 650 MG PO TABS: 650.0000 mg | ORAL_TABLET | Freq: Every day | ORAL | 11 refills | Status: DC

## 2020-06-30 MED ORDER — OXYCODONE HCL ER 20 MG PO T12A: 20.0000 mg | EXTENDED_RELEASE_TABLET | Freq: Two times a day (BID) | ORAL | 0 refills | Status: DC

## 2020-06-30 NOTE — Progress Notes (Signed)
06/30/2020 2:00 PM   Rhonda Bailey [birth year] [id number]  Referring provider: [name], MD [address] [place],  [place] [address]  followup nephrolithiasis  HPI: Ms Rhonda Bailey is a 74yo here for followup for nephrolithiasis. She had her tethered stent removed on 11/19 without issue. UA today shows bacteria. She is having dysuria and back pain.    PMH: Past Medical History:  Diagnosis Date  . Anal fissure   . Anxiety   . Anxiety disorder, unspecified   . Essential (primary) hypertension   . Gastro-esophageal reflux disease with esophagitis   . GERD (gastroesophageal reflux disease)   . Hypokalemia   . Hypothyroidism   . Kidney stone   . Noninfective gastroenteritis and colitis, unspecified   . Sleep apnea     Surgical History: Past Surgical History:  Procedure Laterality Date  . BIOPSY  09/02/2018   Procedure: BIOPSY;  Surgeon: Daneil Dolin, MD;  [facility]: [facility];  Service: Endoscopy;;  colon   . CHOLECYSTECTOMY  05/19/2011   Procedure: LAPAROSCOPIC CHOLECYSTECTOMY;  Surgeon: Jamesetta So;  Location: [facility];  Service: General;  Laterality: N/A;  . COLONOSCOPY  11/2019   Dr. [name]:  rectal ulceration, TI and IC valve appeared normal, 8 mm polyp in proximal ascending colon that was sessile. Inflammation in transverse colon to cecum, inflammation with altered vascularity, edema, erosions, erythema, friability, shallow ulcerations in a continuous and circumferential pattern from rectum to splenix flexure, single twenty mm ulcer in distal rectum. Small and large-  . COLONOSCOPY WITH PROPOFOL N/A 09/02/2018   Dr. Gala Romney: Proctocolitis limited to the left side with some exudate versus partial pseudomembrane formation, erosions and ulceration somewhat superficial.  Terminal ileum normal.  Diverticulosis.  Descending/sigmoid colon biopsy showing severe active colitis, diagnostic features of IBD not identified.  Differential includes infection,  ischemia.  Rectal biopsies with granulation tissue c/w ulcer  . CYSTOSCOPY W/ URETERAL STENT PLACEMENT Right 05/08/2019   Procedure: CYSTOSCOPY WITH RETROGRADE PYELOGRAM/URETERAL STENT PLACEMENT;  Surgeon: Festus Aloe, MD;  Location: [facility];  Service: Urology;  Laterality: Right;  . CYSTOSCOPY W/ URETERAL STENT PLACEMENT Left 05/17/2020   Procedure: CYSTOSCOPY WITH RETROGRADE PYELOGRAM/URETERAL STENT PLACEMENT;  Surgeon: Robley Fries, MD;  Location: [facility];  Service: Urology;  Laterality: Left;  . CYSTOSCOPY WITH RETROGRADE PYELOGRAM, URETEROSCOPY AND STENT PLACEMENT Left 06/23/2020   Procedure: CYSTOSCOPY WITH LEFT RETROGRADE PYELOGRAM, LEFT URETEROSCOPY AND STENT EXCHANGE;  Surgeon: Cleon Gustin, MD;  Location: [facility];  Service: Urology;  Laterality: Left;  . CYSTOSCOPY WITH URETEROSCOPY Right 06/11/2019   Procedure: CYSTOSCOPY WITH DIAGNOSTIC URETEROSCOPY; URETERAL STENT REMOVAL;  Surgeon: Cleon Gustin, MD;  Location: [facility];  Service: Urology;  Laterality: Right;  . HEMORRHOID SURGERY N/A 09/16/2019   Procedure: EXTENSIVE HEMORRHOIDECTOMY;  Surgeon: Aviva Signs, MD;  Location: [facility];  Service: General;  Laterality: N/A;  . RECTAL BIOPSY N/A 09/16/2019   Procedure: BIOPSY RECTAL;  Surgeon: Aviva Signs, MD;  Location: [facility];  Service: General;  Laterality: N/A;  . STONE EXTRACTION WITH BASKET  06/23/2020   Procedure: LEFT URETERAL STONE EXTRACTION WITH BASKET;  Surgeon: Cleon Gustin, MD;  Location: [facility];  Service: Urology;;  . TUBAL LIGATION      Home Medications:  Allergies as of 06/30/2020      Reactions   Tramadol    Says "blew my head up."      Medication List       Accurate as of June 30, 2020  2:00 PM. If you have any questions, ask your nurse or doctor.        acidophilus Caps capsule Take 1 capsule by mouth daily.   ALPRAZolam 0.5 MG tablet Commonly known as: XANAX Take 0.5 mg by mouth at bedtime as needed for sleep.   amLODipine  2.5 MG tablet Commonly known as: NORVASC Take 1 tablet (2.5 mg total) by mouth daily as needed (high BP).   budesonide 3 MG 24 hr capsule Commonly known as: ENTOCORT EC Take 3 capsules (9 mg total) by mouth daily.   levothyroxine 25 MCG tablet Commonly known as: SYNTHROID Take 25 mcg by mouth daily before breakfast.   mesalamine 1.2 g EC tablet Commonly known as: LIALDA TAKE 4 TABLETS BY MOUTH DAILY WITH BREAKFAST. What changed: See the new instructions.   mirabegron ER 25 MG Tb24 tablet Commonly known as: MYRBETRIQ Take 1 tablet (25 mg total) by mouth daily.   multivitamin with minerals Tabs tablet Take 1 tablet by mouth daily.   oxyCODONE-acetaminophen 5-325 MG tablet Commonly known as: PERCOCET/ROXICET Take 1 tablet by mouth every 6 (six) hours as needed for moderate pain or severe pain.   pantoprazole 40 MG tablet Commonly known as: PROTONIX Take 1 tablet (40 mg total) by mouth 2 (two) times daily before a meal. What changed: when to take this   tamsulosin 0.4 MG Caps capsule Commonly known as: FLOMAX Take 1 capsule (0.4 mg total) by mouth daily.       Allergies:  Allergies  Allergen Reactions  . Tramadol     Says "blew my head up."    Family History: Family History  Problem Relation Age of Onset  . Cancer Father        mouth  . Heart disease Sister   . Heart disease Brother   . Cancer Brother        head  . Cancer Daughter        ? type  . Anesthesia problems Neg Hx   . Hypotension Neg Hx   . Malignant hyperthermia Neg Hx   . Pseudochol deficiency Neg Hx   . Colon cancer Neg Hx   . Esophageal cancer Neg Hx   . Inflammatory bowel disease Neg Hx   . Liver disease Neg Hx   . Pancreatic cancer Neg Hx   . Rectal cancer Neg Hx   . Stomach cancer Neg Hx     Social History:  reports that she has never smoked. She has never used smokeless tobacco. She reports previous alcohol use. She reports that she does not use drugs.  ROS: All other review of  systems were reviewed and are negative except what is noted above in HPI  Physical Exam: BP (!) 140/57   Pulse (!) 103   Temp 98 F (36.7 C)   Ht 5' 5"  (1.651 m)   Wt 155 lb (70.3 kg)   BMI 25.79 kg/m   Constitutional:  Alert and oriented, No acute distress. HEENT: [place] AT, moist mucus membranes.  Trachea midline, no masses. Cardiovascular: No clubbing, cyanosis, or edema. Respiratory: Normal respiratory effort, no increased work of breathing. GI: Abdomen is soft, nontender, nondistended, no abdominal masses GU: No CVA tenderness.  Lymph: No cervical or inguinal lymphadenopathy. Skin: No rashes, bruises or suspicious lesions. Neurologic: Grossly intact, no focal deficits, moving all 4 extremities. Psychiatric: Normal mood and affect.  Laboratory Data: Lab Results  Component Value Date   WBC 11.9 (H) 06/28/2020   HGB 10.2 (  L) 06/28/2020   HCT 34.7 (L) 06/28/2020   MCV 80.1 06/28/2020   PLT 447 (H) 06/28/2020    Lab Results  Component Value Date   CREATININE 0.98 06/28/2020    No results found for: PSA  No results found for: TESTOSTERONE  Lab Results  Component Value Date   HGBA1C 6.2 (H) 05/08/2019    Urinalysis    Component Value Date/Time   COLORURINE AMBER (A) 05/18/2020 0418   APPEARANCEUR Clear 06/09/2020 1632   LABSPEC 1.010 05/18/2020 0418   PHURINE 5.0 05/18/2020 0418   GLUCOSEU Negative 06/09/2020 1632   GLUCOSEU NEGATIVE 04/23/2020 1102   HGBUR LARGE (A) 05/18/2020 0418   BILIRUBINUR Negative 06/09/2020 1632   KETONESUR 5 (A) 05/18/2020 0418   PROTEINUR 2+ (A) 06/09/2020 1632   PROTEINUR 30 (A) 05/18/2020 0418   UROBILINOGEN 0.2 04/23/2020 1102   NITRITE Negative 06/09/2020 1632   NITRITE NEGATIVE 05/18/2020 0418   LEUKOCYTESUR 1+ (A) 06/09/2020 1632   LEUKOCYTESUR LARGE (A) 05/18/2020 0418    Lab Results  Component Value Date   LABMICR See below: 06/09/2020   WBCUA 11-30 (A) 06/09/2020   LABEPIT 0-10 06/09/2020   MUCUS Present 06/09/2020    BACTERIA Moderate (A) 06/09/2020    Pertinent Imaging:  No results found for this or any previous visit.  No results found for this or any previous visit.  No results found for this or any previous visit.  No results found for this or any previous visit.  No results found for this or any previous visit.  No results found for this or any previous visit.  No results found for this or any previous visit.  Results for orders placed during the hospital encounter of 05/08/19  CT Renal Stone Study  Narrative CLINICAL DATA:  Flank pain and hematuria  EXAM: CT ABDOMEN AND PELVIS WITHOUT CONTRAST  TECHNIQUE: Multidetector CT imaging of the abdomen and pelvis was performed following the standard protocol without oral or IV contrast.  COMPARISON:  August 29, 2018  FINDINGS: Lower chest: There is no lung base edema or consolidation. There is a small hiatal hernia.  Hepatobiliary: There are scattered tiny calcifications in the liver, likely indicative of prior granulomatous disease. Beyond the small calcifications, no focal liver lesions are evident. Gallbladder is absent. There is no appreciable biliary duct dilatation.  Pancreas: There is fatty infiltration in portions of pancreas. No pancreatic mass or inflammatory focus evident.  Spleen: No splenic lesions appreciable.  Adrenals/Urinary Tract: Right adrenal appears normal. There is a stable left adrenal mass measuring 2.2 x 2.0 cm, indeterminate by CT criteria. Right kidney is mildly edematous. There is no evident pelvic mass on either side. There is mild hydronephrosis on the right. There is no appreciable hydronephrosis on the left. There is no intrarenal calculus on either side. There is a calculus in the right ureter at the mid sacral level measuring 6 x 4 mm. No other ureteral calculi are evident. The wall of the urinary bladder is thickened diffusely. There is mild mesenteric stranding adjacent to the urinary  bladder.  Stomach/Bowel: There are multiple sigmoid diverticula. There is slight thickening of the adjacent mesentery at the levels of the distal descending colon and proximal sigmoid colon. Suspect mild and potentially chronic diverticulitis. No associated fluid. No abscess or perforation. There is no appreciable bowel wall or mesenteric thickening. No evident bowel obstruction. Terminal ileum appears normal. No evident free air or portal venous air.  Vascular/Lymphatic: No abdominal aortic aneurysm. There are  foci of aortic atherosclerosis. There is no appreciable adenopathy in the abdomen or pelvis.  Reproductive: Uterus is midline.  No pelvic masses are evident.  Other: The appendix appears normal. There is no appreciable abscess or ascites in the abdomen or pelvis. Slight fat is noted in the umbilicus.  Musculoskeletal: There is spinal stenosis at L4-5 due to diffuse disc protrusion and bony hypertrophy. There is degenerative change at multiple sites in the lower thoracic and lumbar regions. No blastic or lytic bone lesions evident. No intramuscular lesions are appreciable.  IMPRESSION: 1. 6 x 4 mm calculus in the right ureter at the mid sacral level causing mild hydronephrosis on the right. Right kidney is subtly edematous.  2. Urinary bladder wall is diffusely thickened. There is soft tissue stranding adjacent to the urinary bladder, likely of inflammatory etiology.  3. Extensive sigmoid and distal descending colonic diverticulosis. Mild mesenteric thickening at the level of the distal descending colon and proximal sigmoid colon may represent mild and potentially chronic diverticulitis. Somewhat similar changes noted on prior CT. No fluid in this area of suspected mild inflammation of the distal descending and proximal sigmoid colon. No perforation or abscess.  4. Spinal stenosis at L4-5 due to diffuse disc protrusion and bony hypertrophy.  5. Stable left adrenal  mass. This mass has been followed with stability for less than 1 year. A lesion of this nature should be followed without change for a minimum of 1 year. In this regard, a follow-up CT in 1 year to confirm stability of the left adrenal mass advised. Note that based on attenuation criteria, it is an indeterminate category.  6. No bowel obstruction. No abscess in the abdomen or pelvis. Appendix appears normal.  7.  Small hiatal hernia.   Electronically Signed By: Lowella Grip III M.D. On: 05/08/2019 11:12   Assessment & Plan:    1. Nephrolithiasis -RTC 1 month with renal US - Urinalysis, Routine w reflex microscopic  2. UTI -Urine for culture -macrobid 164m BID for 7 days   No follow-ups on file.  PNicolette Bang MD  [facility]

## 2020-06-30 NOTE — Progress Notes (Signed)
Urological Symptom Review  Patient is experiencing the following symptoms: Frequent urination Hard to postpone urination Get up at night to urinate Leakage of urine  Kidney stones   Review of Systems  Gastrointestinal (upper)  : Indigestion/heartburn  Gastrointestinal (lower) : Diarrhea  Constitutional : Fatigue  Skin: Negative for skin symptoms  Eyes: Negative for eye symptoms  Ear/Nose/Throat : Negative for Ear/Nose/Throat symptoms  Hematologic/Lymphatic: Negative for Hematologic/Lymphatic symptoms  Cardiovascular : Negative for cardiovascular symptoms  Respiratory : Negative for respiratory symptoms  Endocrine: Negative for endocrine symptoms  Musculoskeletal: Back pain  Neurological: Negative for neurological symptoms  Psychologic: Negative for psychiatric symptoms

## 2020-06-30 NOTE — Patient Instructions (Signed)

## 2020-07-01 ENCOUNTER — Other Ambulatory Visit: Payer: Self-pay | Admitting: Urology

## 2020-07-01 LAB — URINALYSIS, ROUTINE W REFLEX MICROSCOPIC
Bilirubin, UA: NEGATIVE
Glucose, UA: NEGATIVE
Ketones, UA: NEGATIVE
Nitrite, UA: NEGATIVE
RBC, UA: NEGATIVE
Specific Gravity, UA: 1.02 (ref 1.005–1.030)
Urobilinogen, Ur: 0.2 mg/dL (ref 0.2–1.0)
pH, UA: 6 (ref 5.0–7.5)

## 2020-07-01 LAB — MICROSCOPIC EXAMINATION: Epithelial Cells (non renal): NONE SEEN /hpf (ref 0–10)

## 2020-07-05 ENCOUNTER — Telehealth: Payer: Self-pay

## 2020-07-06 ENCOUNTER — Other Ambulatory Visit: Payer: Self-pay | Admitting: Urology

## 2020-07-06 MED ORDER — OXYCODONE HCL ER 20 MG PO T12A: 20.0000 mg | EXTENDED_RELEASE_TABLET | Freq: Two times a day (BID) | ORAL | 0 refills | Status: DC

## 2020-07-07 ENCOUNTER — Telehealth: Payer: Self-pay | Admitting: Hematology & Oncology

## 2020-07-07 NOTE — Telephone Encounter (Signed)
Pt seen

## 2020-07-07 NOTE — Telephone Encounter (Signed)
Patient called requesting information on total cost of iron infusions that have been scheduled .  I gave the patient the number [phone] to call.

## 2020-07-08 ENCOUNTER — Other Ambulatory Visit: Payer: Self-pay

## 2020-07-08 ENCOUNTER — Inpatient Hospital Stay: Payer: Medicare HMO | Attending: Family

## 2020-07-08 VITALS — BP 119/60 | HR 79 | Temp 98.1°F | Resp 18

## 2020-07-08 DIAGNOSIS — K589 Irritable bowel syndrome without diarrhea: Secondary | ICD-10-CM | POA: Diagnosis not present

## 2020-07-08 DIAGNOSIS — D500 Iron deficiency anemia secondary to blood loss (chronic): Secondary | ICD-10-CM | POA: Diagnosis not present

## 2020-07-08 MED ORDER — SODIUM CHLORIDE 0.9 % IV SOLN
200.0000 mg | Freq: Once | INTRAVENOUS | Status: AC
  Administered 2020-07-08: 200 mg via INTRAVENOUS
  Filled 2020-07-08: qty 200

## 2020-07-08 MED ORDER — SODIUM CHLORIDE 0.9 % IV SOLN
Freq: Once | INTRAVENOUS | Status: AC
  Filled 2020-07-08: qty 250

## 2020-07-08 NOTE — Patient Instructions (Signed)

## 2020-07-08 NOTE — Telephone Encounter (Signed)
rx sent

## 2020-07-12 NOTE — Telephone Encounter (Signed)
See prior note. MD has seen results.

## 2020-07-13 ENCOUNTER — Ambulatory Visit: Payer: Medicare HMO | Admitting: Gastroenterology

## 2020-07-13 ENCOUNTER — Telehealth: Payer: Self-pay

## 2020-07-13 NOTE — Telephone Encounter (Signed)
Pt called and requested to cancel 08/03/20 appt. Appt canceled.

## 2020-07-15 ENCOUNTER — Inpatient Hospital Stay: Payer: Medicare HMO

## 2020-07-15 ENCOUNTER — Other Ambulatory Visit: Payer: Self-pay

## 2020-07-15 VITALS — BP 138/59 | HR 76 | Temp 97.8°F | Resp 17

## 2020-07-15 DIAGNOSIS — E559 Vitamin D deficiency, unspecified: Secondary | ICD-10-CM | POA: Diagnosis not present

## 2020-07-15 DIAGNOSIS — D500 Iron deficiency anemia secondary to blood loss (chronic): Secondary | ICD-10-CM

## 2020-07-15 DIAGNOSIS — R5381 Other malaise: Secondary | ICD-10-CM | POA: Diagnosis not present

## 2020-07-15 DIAGNOSIS — M545 Low back pain, unspecified: Secondary | ICD-10-CM | POA: Diagnosis not present

## 2020-07-15 DIAGNOSIS — K589 Irritable bowel syndrome without diarrhea: Secondary | ICD-10-CM | POA: Diagnosis not present

## 2020-07-15 DIAGNOSIS — Z79899 Other long term (current) drug therapy: Secondary | ICD-10-CM | POA: Diagnosis not present

## 2020-07-15 MED ORDER — SODIUM CHLORIDE 0.9 % IV SOLN
200.0000 mg | Freq: Once | INTRAVENOUS | Status: AC
  Administered 2020-07-15: 200 mg via INTRAVENOUS
  Filled 2020-07-15: qty 200

## 2020-07-15 MED ORDER — SODIUM CHLORIDE 0.9 % IV SOLN
Freq: Once | INTRAVENOUS | Status: AC
  Filled 2020-07-15: qty 250

## 2020-07-15 NOTE — Patient Instructions (Addendum)

## 2020-07-22 ENCOUNTER — Inpatient Hospital Stay: Payer: Medicare HMO

## 2020-07-22 ENCOUNTER — Other Ambulatory Visit: Payer: Self-pay

## 2020-07-22 VITALS — BP 133/56 | HR 73 | Resp 20

## 2020-07-22 DIAGNOSIS — K589 Irritable bowel syndrome without diarrhea: Secondary | ICD-10-CM | POA: Diagnosis not present

## 2020-07-22 DIAGNOSIS — D500 Iron deficiency anemia secondary to blood loss (chronic): Secondary | ICD-10-CM

## 2020-07-22 MED ORDER — IRON SUCROSE 20 MG/ML IV SOLN
200.0000 mg | Freq: Once | INTRAVENOUS | Status: AC
  Administered 2020-07-22: 200 mg via INTRAVENOUS
  Filled 2020-07-22: qty 200

## 2020-07-22 MED ORDER — SODIUM CHLORIDE 0.9 % IV SOLN
Freq: Once | INTRAVENOUS | Status: AC
  Filled 2020-07-22: qty 250

## 2020-07-22 NOTE — Patient Instructions (Signed)

## 2020-07-29 ENCOUNTER — Other Ambulatory Visit: Payer: Self-pay

## 2020-07-29 ENCOUNTER — Inpatient Hospital Stay: Payer: Medicare HMO

## 2020-07-29 VITALS — BP 122/63 | HR 96 | Temp 97.8°F | Resp 18

## 2020-07-29 DIAGNOSIS — K589 Irritable bowel syndrome without diarrhea: Secondary | ICD-10-CM | POA: Diagnosis not present

## 2020-07-29 DIAGNOSIS — D500 Iron deficiency anemia secondary to blood loss (chronic): Secondary | ICD-10-CM | POA: Diagnosis not present

## 2020-07-29 MED ORDER — SODIUM CHLORIDE 0.9 % IV SOLN
200.0000 mg | Freq: Once | INTRAVENOUS | Status: AC
  Administered 2020-07-29: 200 mg via INTRAVENOUS
  Filled 2020-07-29: qty 200

## 2020-07-29 MED ORDER — SODIUM CHLORIDE 0.9 % IV SOLN
Freq: Once | INTRAVENOUS | Status: AC
  Filled 2020-07-29: qty 250

## 2020-07-29 NOTE — Patient Instructions (Signed)
Iron Sucrose injection (Venofer) What is this medicine? IRON SUCROSE (AHY ern SOO krohs) is an iron complex. Iron is used to make healthy red blood cells, which carry oxygen and nutrients throughout the body. This medicine is used to treat iron deficiency anemia in people with chronic kidney disease. This medicine may be used for other purposes; ask your health care provider or pharmacist if you have questions. COMMON BRAND NAME(S): Venofer What should I tell my health care provider before I take this medicine? They need to know if you have any of these conditions:  anemia not caused by low iron levels  heart disease  high levels of iron in the blood  kidney disease  liver disease  an unusual or allergic reaction to iron, other medicines, foods, dyes, or preservatives  pregnant or trying to get pregnant  breast-feeding How should I use this medicine? This medicine is for infusion into a vein. It is given by a health care professional in a hospital or clinic setting. Talk to your pediatrician regarding the use of this medicine in children. While this drug may be prescribed for children as young as 2 years for selected conditions, precautions do apply. Overdosage: If you think you have taken too much of this medicine contact a poison control center or emergency room at once. NOTE: This medicine is only for you. Do not share this medicine with others. What if I miss a dose? It is important not to miss your dose. Call your doctor or health care professional if you are unable to keep an appointment. What may interact with this medicine? Do not take this medicine with any of the following medications:  deferoxamine  dimercaprol  other iron products This medicine may also interact with the following medications:  chloramphenicol  deferasirox This list may not describe all possible interactions. Give your health care provider a list of all the medicines, herbs, non-prescription  drugs, or dietary supplements you use. Also tell them if you smoke, drink alcohol, or use illegal drugs. Some items may interact with your medicine. What should I watch for while using this medicine? Visit your doctor or healthcare professional regularly. Tell your doctor or healthcare professional if your symptoms do not start to get better or if they get worse. You may need blood work done while you are taking this medicine. You may need to follow a special diet. Talk to your doctor. Foods that contain iron include: whole grains/cereals, dried fruits, beans, or peas, leafy green vegetables, and organ meats (liver, kidney). What side effects may I notice from receiving this medicine? Side effects that you should report to your doctor or health care professional as soon as possible:  allergic reactions like skin rash, itching or hives, swelling of the face, lips, or tongue  breathing problems  changes in blood pressure  cough  fast, irregular heartbeat  feeling faint or lightheaded, falls  fever or chills  flushing, sweating, or hot feelings  joint or muscle aches/pains  seizures  swelling of the ankles or feet  unusually weak or tired Side effects that usually do not require medical attention (report to your doctor or health care professional if they continue or are bothersome):  diarrhea  feeling achy  headache  irritation at site where injected  nausea, vomiting  stomach upset  tiredness This list may not describe all possible side effects. Call your doctor for medical advice about side effects. You may report side effects to FDA at 1-800-FDA-1088. Where should I  keep my medicine? This drug is given in a hospital or clinic and will not be stored at home. NOTE: This sheet is a summary. It may not cover all possible information. If you have questions about this medicine, talk to your doctor, pharmacist, or health care provider.  2020 Elsevier/Gold Standard  (2011-05-04 17:14:35)

## 2020-07-29 NOTE — Progress Notes (Signed)
Patient does not want to stay for the 30 minute post IV iron observation. Patient discharged ambulatory with walker without complications or concerns.

## 2020-08-03 ENCOUNTER — Ambulatory Visit: Payer: Medicare HMO | Admitting: Urology

## 2020-08-04 ENCOUNTER — Other Ambulatory Visit: Payer: Self-pay | Admitting: Urology

## 2020-08-05 ENCOUNTER — Inpatient Hospital Stay: Payer: Medicare HMO

## 2020-08-05 ENCOUNTER — Other Ambulatory Visit: Payer: Self-pay

## 2020-08-05 VITALS — BP 120/53 | HR 79 | Temp 98.2°F | Resp 18

## 2020-08-05 DIAGNOSIS — K589 Irritable bowel syndrome without diarrhea: Secondary | ICD-10-CM | POA: Diagnosis not present

## 2020-08-05 DIAGNOSIS — D500 Iron deficiency anemia secondary to blood loss (chronic): Secondary | ICD-10-CM | POA: Diagnosis not present

## 2020-08-05 MED ORDER — SODIUM CHLORIDE 0.9 % IV SOLN
Freq: Once | INTRAVENOUS | Status: AC
  Filled 2020-08-05: qty 250

## 2020-08-05 MED ORDER — SODIUM CHLORIDE 0.9 % IV SOLN
200.0000 mg | Freq: Once | INTRAVENOUS | Status: AC
  Administered 2020-08-05: 200 mg via INTRAVENOUS
  Filled 2020-08-05: qty 200

## 2020-08-05 NOTE — Patient Instructions (Signed)
Iron Sucrose injection (Venofer) What is this medicine? IRON SUCROSE (AHY ern SOO krohs) is an iron complex. Iron is used to make healthy red blood cells, which carry oxygen and nutrients throughout the body. This medicine is used to treat iron deficiency anemia in people with chronic kidney disease. This medicine may be used for other purposes; ask your health care provider or pharmacist if you have questions. COMMON BRAND NAME(S): Venofer What should I tell my health care provider before I take this medicine? They need to know if you have any of these conditions:  anemia not caused by low iron levels  heart disease  high levels of iron in the blood  kidney disease  liver disease  an unusual or allergic reaction to iron, other medicines, foods, dyes, or preservatives  pregnant or trying to get pregnant  breast-feeding How should I use this medicine? This medicine is for infusion into a vein. It is given by a health care professional in a hospital or clinic setting. Talk to your pediatrician regarding the use of this medicine in children. While this drug may be prescribed for children as young as 2 years for selected conditions, precautions do apply. Overdosage: If you think you have taken too much of this medicine contact a poison control center or emergency room at once. NOTE: This medicine is only for you. Do not share this medicine with others. What if I miss a dose? It is important not to miss your dose. Call your doctor or health care professional if you are unable to keep an appointment. What may interact with this medicine? Do not take this medicine with any of the following medications:  deferoxamine  dimercaprol  other iron products This medicine may also interact with the following medications:  chloramphenicol  deferasirox This list may not describe all possible interactions. Give your health care provider a list of all the medicines, herbs, non-prescription  drugs, or dietary supplements you use. Also tell them if you smoke, drink alcohol, or use illegal drugs. Some items may interact with your medicine. What should I watch for while using this medicine? Visit your doctor or healthcare professional regularly. Tell your doctor or healthcare professional if your symptoms do not start to get better or if they get worse. You may need blood work done while you are taking this medicine. You may need to follow a special diet. Talk to your doctor. Foods that contain iron include: whole grains/cereals, dried fruits, beans, or peas, leafy green vegetables, and organ meats (liver, kidney). What side effects may I notice from receiving this medicine? Side effects that you should report to your doctor or health care professional as soon as possible:  allergic reactions like skin rash, itching or hives, swelling of the face, lips, or tongue  breathing problems  changes in blood pressure  cough  fast, irregular heartbeat  feeling faint or lightheaded, falls  fever or chills  flushing, sweating, or hot feelings  joint or muscle aches/pains  seizures  swelling of the ankles or feet  unusually weak or tired Side effects that usually do not require medical attention (report to your doctor or health care professional if they continue or are bothersome):  diarrhea  feeling achy  headache  irritation at site where injected  nausea, vomiting  stomach upset  tiredness This list may not describe all possible side effects. Call your doctor for medical advice about side effects. You may report side effects to FDA at 1-800-FDA-1088. Where should I  keep my medicine? This drug is given in a hospital or clinic and will not be stored at home. NOTE: This sheet is a summary. It may not cover all possible information. If you have questions about this medicine, talk to your doctor, pharmacist, or health care provider.  2020 Elsevier/Gold Standard  (2011-05-04 17:14:35)

## 2020-08-05 NOTE — Progress Notes (Signed)
Pt. refused to wait 30 minutes post infusion. Pt discharged in no apparent distress. Pt left ambulatory without assistance. Pt aware of discharge instructions and verbalized understanding and had no further questions. Stable and asymptomatic upon discharge.

## 2020-08-09 ENCOUNTER — Telehealth: Payer: Self-pay | Admitting: Gastroenterology

## 2020-08-09 MED ORDER — MESALAMINE 1.2 G PO TBEC: 4.8000 g | DELAYED_RELEASE_TABLET | Freq: Every day | ORAL | 2 refills | Status: DC

## 2020-08-09 NOTE — Telephone Encounter (Signed)
Patient needs to refill mesalamine. Please send it to [facility] in [facility].

## 2020-08-09 NOTE — Telephone Encounter (Signed)
Refill sent to [place]

## 2020-08-12 ENCOUNTER — Other Ambulatory Visit: Payer: Self-pay | Admitting: Physical Medicine & Rehabilitation

## 2020-08-12 DIAGNOSIS — M25551 Pain in right hip: Secondary | ICD-10-CM | POA: Diagnosis not present

## 2020-08-12 DIAGNOSIS — M545 Low back pain, unspecified: Secondary | ICD-10-CM

## 2020-08-12 DIAGNOSIS — M25552 Pain in left hip: Secondary | ICD-10-CM | POA: Diagnosis not present

## 2020-08-17 ENCOUNTER — Other Ambulatory Visit: Payer: Self-pay

## 2020-08-17 DIAGNOSIS — N200 Calculus of kidney: Secondary | ICD-10-CM

## 2020-08-17 MED ORDER — TAMSULOSIN HCL 0.4 MG PO CAPS: 0.4000 mg | ORAL_CAPSULE | Freq: Every day | ORAL | 1 refills | Status: DC

## 2020-08-30 ENCOUNTER — Encounter: Payer: Self-pay | Admitting: Family

## 2020-08-30 ENCOUNTER — Inpatient Hospital Stay: Payer: Medicare HMO | Attending: Family

## 2020-08-30 ENCOUNTER — Inpatient Hospital Stay (HOSPITAL_BASED_OUTPATIENT_CLINIC_OR_DEPARTMENT_OTHER): Payer: Medicare HMO | Admitting: Family

## 2020-08-30 ENCOUNTER — Telehealth: Payer: Self-pay | Admitting: Family

## 2020-08-30 ENCOUNTER — Other Ambulatory Visit: Payer: Self-pay

## 2020-08-30 VITALS — BP 134/60 | HR 76 | Temp 97.4°F | Resp 18 | Ht 65.0 in | Wt 150.0 lb

## 2020-08-30 DIAGNOSIS — K589 Irritable bowel syndrome without diarrhea: Secondary | ICD-10-CM | POA: Diagnosis not present

## 2020-08-30 DIAGNOSIS — D500 Iron deficiency anemia secondary to blood loss (chronic): Secondary | ICD-10-CM | POA: Diagnosis not present

## 2020-08-30 DIAGNOSIS — Z79899 Other long term (current) drug therapy: Secondary | ICD-10-CM | POA: Insufficient documentation

## 2020-08-30 LAB — IRON AND TIBC
Iron: 72 ug/dL (ref 41–142)
Saturation Ratios: 31 % (ref 21–57)
TIBC: 231 ug/dL — ABNORMAL LOW (ref 236–444)
UIBC: 160 ug/dL (ref 120–384)

## 2020-08-30 LAB — CBC WITH DIFFERENTIAL (CANCER CENTER ONLY)
Abs Immature Granulocytes: 0.05 10*3/uL (ref 0.00–0.07)
Basophils Absolute: 0 10*3/uL (ref 0.0–0.1)
Basophils Relative: 0 %
Eosinophils Absolute: 0.2 10*3/uL (ref 0.0–0.5)
Eosinophils Relative: 2 %
HCT: 42.9 % (ref 36.0–46.0)
Hemoglobin: 12.8 g/dL (ref 12.0–15.0)
Immature Granulocytes: 1 %
Lymphocytes Relative: 40 %
Lymphs Abs: 2.8 10*3/uL (ref 0.7–4.0)
MCH: 25.3 pg — ABNORMAL LOW (ref 26.0–34.0)
MCHC: 29.8 g/dL — ABNORMAL LOW (ref 30.0–36.0)
MCV: 85 fL (ref 80.0–100.0)
Monocytes Absolute: 0.6 10*3/uL (ref 0.1–1.0)
Monocytes Relative: 8 %
Neutro Abs: 3.4 10*3/uL (ref 1.7–7.7)
Neutrophils Relative %: 49 %
Platelet Count: 256 10*3/uL (ref 150–400)
RBC: 5.05 MIL/uL (ref 3.87–5.11)
RDW: 18.7 % — ABNORMAL HIGH (ref 11.5–15.5)
WBC Count: 7 10*3/uL (ref 4.0–10.5)
nRBC: 0 % (ref 0.0–0.2)

## 2020-08-30 LAB — CMP (CANCER CENTER ONLY)
ALT: 11 U/L (ref 0–44)
AST: 17 U/L (ref 15–41)
Albumin: 4.2 g/dL (ref 3.5–5.0)
Alkaline Phosphatase: 89 U/L (ref 38–126)
Anion gap: 9 (ref 5–15)
BUN: 18 mg/dL (ref 8–23)
CO2: 26 mmol/L (ref 22–32)
Calcium: 10.1 mg/dL (ref 8.9–10.3)
Chloride: 106 mmol/L (ref 98–111)
Creatinine: 1.14 mg/dL — ABNORMAL HIGH (ref 0.44–1.00)
GFR, Estimated: 51 mL/min — ABNORMAL LOW (ref >60)
Glucose, Bld: 98 mg/dL (ref 70–99)
Potassium: 4.5 mmol/L (ref 3.5–5.1)
Sodium: 141 mmol/L (ref 135–145)
Total Bilirubin: 0.4 mg/dL (ref 0.3–1.2)
Total Protein: 7.6 g/dL (ref 6.5–8.1)

## 2020-08-30 LAB — RETICULOCYTES
Immature Retic Fract: 14.2 % (ref 2.3–15.9)
RBC.: 5.04 MIL/uL (ref 3.87–5.11)
Retic Count, Absolute: 48.9 10*3/uL (ref 19.0–186.0)
Retic Ct Pct: 1 % (ref 0.4–3.1)

## 2020-08-30 LAB — FERRITIN: Ferritin: 358 ng/mL — ABNORMAL HIGH (ref 11–307)

## 2020-08-30 NOTE — Telephone Encounter (Signed)
Appointments scheduled calendar printed & mailed per 1/24 los

## 2020-08-30 NOTE — Progress Notes (Signed)
Hematology and Oncology Follow Up Visit  Rhonda Bailey [id number] 06/01/1946 75 y.o. 08/30/2020   Principle Diagnosis:  Iron deficiency anemia due to chronic blood loss with IBS (Crohn's vs. UC)  Current Therapy:  IV iron as indicated    Interim History:  Rhonda Bailey is here today with her daughter for follow-up. She is feeling much better since receiving IV iron but still notes some fatigue and not much of an appetite.  She states that she has not been eating well but is staying properly hydrated throughout the day. Weight is 150 lbs.  No fever, chills, n/v, cough, rash, dizziness, SOB, chest pain, palpitations, abdominal pain or changes in bowel or bladder habits.  She states that if she was eating better she feels that her IBS flares would be better controlled. She has not followed up regarding starting Entyvio or Remicade.  She has not noted any blood loss. No abnormal bruising, no petechiae.  No swelling, tenderness, numbness or tingling in her extremities.  No falls or syncope.   ECOG Performance Status: 1 - Symptomatic but completely ambulatory  Medications:  Allergies as of 08/30/2020      Reactions   Tramadol    Says "blew my head up."      Medication List       Accurate as of August 30, 2020  8:56 AM. If you have any questions, ask your nurse or doctor.        acidophilus Caps capsule Take 1 capsule by mouth daily.   ALPRAZolam 0.5 MG tablet Commonly known as: XANAX Take 0.5 mg by mouth at bedtime as needed for sleep.   amLODipine 2.5 MG tablet Commonly known as: NORVASC Take 1 tablet (2.5 mg total) by mouth daily as needed (high BP).   budesonide 3 MG 24 hr capsule Commonly known as: ENTOCORT EC Take 3 capsules (9 mg total) by mouth daily.   levothyroxine 25 MCG tablet Commonly known as: SYNTHROID Take 25 mcg by mouth daily before breakfast.   mesalamine 1.2 g EC tablet Commonly known as: LIALDA Take 4 tablets (4.8 g total) by mouth daily with  breakfast.   mirabegron ER 25 MG Tb24 tablet Commonly known as: MYRBETRIQ Take 1 tablet (25 mg total) by mouth daily.   multivitamin with minerals Tabs tablet Take 1 tablet by mouth daily.   nitrofurantoin (macrocrystal-monohydrate) 100 MG capsule Commonly known as: MACROBID Take 1 capsule (100 mg total) by mouth every 12 (twelve) hours.   oxyCODONE 20 mg 12 hr tablet Commonly known as: OXYCONTIN Take 1 tablet (20 mg total) by mouth every 12 (twelve) hours.   oxyCODONE-acetaminophen 5-325 MG tablet Commonly known as: PERCOCET/ROXICET Take 1 tablet by mouth every 6 (six) hours as needed for moderate pain or severe pain.   pantoprazole 40 MG tablet Commonly known as: PROTONIX Take 1 tablet (40 mg total) by mouth 2 (two) times daily before a meal. What changed: when to take this   sodium bicarbonate 650 MG tablet Take 1 tablet (650 mg total) by mouth daily.   tamsulosin 0.4 MG Caps capsule Commonly known as: FLOMAX Take 1 capsule (0.4 mg total) by mouth daily.       Allergies:  Allergies  Allergen Reactions  . Tramadol     Says "blew my head up."    Past Medical History, Surgical history, Social history, and Family History were reviewed and updated.  Review of Systems: All other 10 point review of systems is negative.   Physical Exam:  vitals were not taken for this visit.   Wt Readings from Last 3 Encounters:  06/30/20 155 lb (70.3 kg)  06/28/20 155 lb (70.3 kg)  06/23/20 160 lb (72.6 kg)    Ocular: Sclerae unicteric, pupils equal, round and reactive to light Ear-nose-throat: Oropharynx clear, dentition fair Lymphatic: No cervical or supraclavicular adenopathy Lungs no rales or rhonchi, good excursion bilaterally Heart regular rate and rhythm, no murmur appreciated Abd soft, nontender, positive bowel sounds MSK no focal spinal tenderness, no joint edema Neuro: non-focal, well-oriented, appropriate affect Breasts: Deferred   Lab Results  Component  Value Date   WBC 7.0 08/30/2020   HGB 12.8 08/30/2020   HCT 42.9 08/30/2020   MCV 85.0 08/30/2020   PLT 256 08/30/2020   Lab Results  Component Value Date   FERRITIN 111 06/28/2020   IRON 14 (L) 06/28/2020   TIBC 245 06/28/2020   UIBC 231 06/28/2020   IRONPCTSAT 6 (L) 06/28/2020   Lab Results  Component Value Date   RETICCTPCT 1.0 08/30/2020   RBC 5.04 08/30/2020   RETICCTABS 62,860 12/09/2019   No results found for: Nils Pyle [facility] Lab Results  Component Value Date   IGGSERUM 870 12/09/2019   No results found for: Odetta Pink, SPEI   Chemistry      Component Value Date/Time   NA 141 08/30/2020 0813   NA 142 03/19/2019 0000   K 4.5 08/30/2020 0813   CL 106 08/30/2020 0813   CO2 26 08/30/2020 0813   BUN 18 08/30/2020 0813   BUN 16 03/19/2019 0000   CREATININE 1.14 (H) 08/30/2020 0813   GLU 101 03/19/2019 0000      Component Value Date/Time   CALCIUM 10.1 08/30/2020 0813   ALKPHOS 89 08/30/2020 0813   AST 17 08/30/2020 0813   ALT 11 08/30/2020 0813   BILITOT 0.4 08/30/2020 0813       Impression and Plan: Rhonda Bailey is a very pleasant 75 yo caucasian female with iron deficiency secondary to chronic blood loss with IBS (Crohn's vs. UC).  Iron studies pending. We will replace if needed.  She was encouraged to increase her protein intake and to follow-up with GI regarding treatment for IBS.  Follow-up in 3 months.  She can contact our office with any questions or concerns.   [name], NP 1/24/20228:56 AM

## 2020-08-31 ENCOUNTER — Other Ambulatory Visit: Payer: Medicare HMO

## 2020-09-02 ENCOUNTER — Ambulatory Visit
Admission: RE | Admit: 2020-09-02 | Discharge: 2020-09-02 | Disposition: A | Discharge status: Home / Self Care | Payer: Medicare HMO | Source: Ambulatory Visit | Attending: Physical Medicine & Rehabilitation | Admitting: Physical Medicine & Rehabilitation

## 2020-09-02 DIAGNOSIS — M48061 Spinal stenosis, lumbar region without neurogenic claudication: Secondary | ICD-10-CM | POA: Diagnosis not present

## 2020-09-02 DIAGNOSIS — M545 Low back pain, unspecified: Secondary | ICD-10-CM

## 2020-09-06 DIAGNOSIS — M25551 Pain in right hip: Secondary | ICD-10-CM | POA: Diagnosis not present

## 2020-09-06 DIAGNOSIS — M5416 Radiculopathy, lumbar region: Secondary | ICD-10-CM | POA: Diagnosis not present

## 2020-09-07 ENCOUNTER — Other Ambulatory Visit: Payer: Self-pay | Admitting: Gastroenterology

## 2020-09-13 DIAGNOSIS — M5416 Radiculopathy, lumbar region: Secondary | ICD-10-CM | POA: Diagnosis not present

## 2020-09-13 DIAGNOSIS — M25551 Pain in right hip: Secondary | ICD-10-CM | POA: Diagnosis not present

## 2020-09-14 ENCOUNTER — Other Ambulatory Visit: Payer: Self-pay | Admitting: Gastroenterology

## 2020-09-15 ENCOUNTER — Telehealth: Payer: Self-pay | Admitting: Gastroenterology

## 2020-09-16 NOTE — Telephone Encounter (Signed)
Dr.Mansouraty pt's insurance will no longer cover Lialda. Can you advise on another medication that you would consider? I will contact insurance company after that.

## 2020-09-17 NOTE — Telephone Encounter (Signed)
I called and spoke with the patient this afternoon.  She describes significant continued improvement while just remaining on mesalamine products.  She is not sure why the infusion center and [facility] company never reached out to her in regards to trying to get set up for her infusion.  She did not however try to reach [place] either.  Thankfully, she seems to be doing okay.  It is sad to hear that mesalamine may not be covered especially when she is doing so well.  Can we try to reach out to the pharmacy and see if another mesalamine product could be considered such that we could switch her especially if she is doing well. I also discussed with the patient that we may consider repeat colonoscopy to reevaluate the significant inflammation that she had because if she is improving on azathioprine and mesalamine maybe we can stave her from needing to be on biologic therapy but we will see. She needs a follow-up in clinic with me in the next 4 weeks or with one of the APP's. Please keep me up-to-date with what medication we can get for her instead of Lialda, if at all possible. Thank you. GM

## 2020-09-17 NOTE — Telephone Encounter (Signed)
Dr.Mansouraty - pt states " I am not on Remicade."She states that the only infusion that she has received most recently is the IV iron infusion.I am not sure why she's not getting Remicade Infusion's, but I assume oral therapy is needed at this point because she is not receiving Remicade.If you can tell me what options you would recommend orally, I will call patients insurance company and see what type of coverage she may have with other oral options.

## 2020-09-17 NOTE — Telephone Encounter (Signed)
Since the patient has been initiated on Remicade, we may be able to stop oral therapy.  If she is okay with trialing this then we should go ahead and stop the mesalamine.  If she is scheduled for a follow-up with me or one of the APP is in the next few weeks? Thanks. GM

## 2020-09-22 ENCOUNTER — Telehealth: Payer: Self-pay | Admitting: Gastroenterology

## 2020-09-22 NOTE — Telephone Encounter (Signed)
Pt would like an update on a medication that Dr. [name] told her hat we were going to try to find for her.

## 2020-09-23 ENCOUNTER — Other Ambulatory Visit: Payer: Self-pay | Admitting: Gastroenterology

## 2020-09-23 MED ORDER — SULFASALAZINE 500 MG PO TABS: ORAL_TABLET | ORAL | 1 refills | Status: DC

## 2020-09-23 NOTE — Addendum Note (Signed)
Addended byDebbe Mounts on: 09/23/2020 02:52 PM   Modules accepted: Orders

## 2020-09-23 NOTE — Telephone Encounter (Addendum)
Lialda( mesalamine) has been d/c. Prescription for Sulfasalazine 1,03m( BID)  Total of (2,0071m daily has been sent to pt's pharmacy. Called and left detail message for pt.

## 2020-09-24 ENCOUNTER — Encounter: Payer: Self-pay | Admitting: Gastroenterology

## 2020-09-24 NOTE — Telephone Encounter (Signed)
Wonders if she can take the new medication that was ordered for her together with her other medications? Scared they will interfere with each other.

## 2020-09-24 NOTE — Telephone Encounter (Signed)
Returned call to pt. Informed pt that she could take Sulfasalazine in the morning and evening, or she could take both tablets together in the morning. Advised that she could wait 2 hours before taking additional medications if she still had some concerns. Pt voiced understanding.

## 2020-09-29 DIAGNOSIS — M5416 Radiculopathy, lumbar region: Secondary | ICD-10-CM | POA: Diagnosis not present

## 2020-10-14 NOTE — Telephone Encounter (Signed)
Patient requesting a call back please.  States she is having some issues after starting medication.  Please advise.

## 2020-10-18 NOTE — Telephone Encounter (Signed)
Left message

## 2020-10-19 NOTE — Telephone Encounter (Signed)
Pt returned call to let us know that she is actually doing well on Sulfasalazine. She states that the first couple of days were a little uneasy, but she had adapted well to medication. Pt will let us know if anything changes.

## 2020-11-01 ENCOUNTER — Telehealth: Payer: Self-pay

## 2020-11-01 NOTE — Telephone Encounter (Signed)
Pt called in to r/s her appt to be on a Friday, done    Rhonda Bailey

## 2020-11-10 ENCOUNTER — Other Ambulatory Visit: Payer: Self-pay | Admitting: Gastroenterology

## 2020-11-11 NOTE — Telephone Encounter (Signed)
Patient can be continued on stool softeners for now. Thanks. GM

## 2020-11-11 NOTE — Telephone Encounter (Signed)
Patient calling requesting a call back please to give an update on how she is doing on the medication.

## 2020-11-11 NOTE — Telephone Encounter (Signed)
Return pt's call. Pt states that she is having some constipation with Sulfasalazine. She has notice some discharge in stool, but no blood. Pt states that hemorrhoids have been a little flared, but taking stool softeners to help with that. Overall patient states that she is doing well on Sulfasalazine, just some occasional constipation.

## 2020-11-15 NOTE — Telephone Encounter (Signed)
Pt informed to continue stool softeners.

## 2020-11-29 ENCOUNTER — Other Ambulatory Visit: Payer: Medicare HMO

## 2020-11-29 ENCOUNTER — Ambulatory Visit: Payer: Medicare HMO | Admitting: Family

## 2020-12-03 ENCOUNTER — Observation Stay (HOSPITAL_COMMUNITY)
Admission: EM | Admit: 2020-12-03 | Discharge: 2020-12-06 | Disposition: A | Discharge status: Home / Self Care | Payer: Medicare HMO | Attending: Internal Medicine | Admitting: Internal Medicine

## 2020-12-03 ENCOUNTER — Other Ambulatory Visit: Payer: Self-pay

## 2020-12-03 ENCOUNTER — Inpatient Hospital Stay (HOSPITAL_BASED_OUTPATIENT_CLINIC_OR_DEPARTMENT_OTHER): Payer: Medicare HMO | Admitting: Family

## 2020-12-03 ENCOUNTER — Inpatient Hospital Stay: Payer: Medicare HMO | Attending: Family

## 2020-12-03 VITALS — BP 133/56 | HR 78 | Temp 97.9°F | Resp 17 | Ht 65.0 in | Wt 165.0 lb

## 2020-12-03 DIAGNOSIS — I10 Essential (primary) hypertension: Secondary | ICD-10-CM | POA: Insufficient documentation

## 2020-12-03 DIAGNOSIS — K589 Irritable bowel syndrome without diarrhea: Secondary | ICD-10-CM | POA: Insufficient documentation

## 2020-12-03 DIAGNOSIS — D500 Iron deficiency anemia secondary to blood loss (chronic): Secondary | ICD-10-CM

## 2020-12-03 DIAGNOSIS — R58 Hemorrhage, not elsewhere classified: Secondary | ICD-10-CM | POA: Diagnosis not present

## 2020-12-03 DIAGNOSIS — K922 Gastrointestinal hemorrhage, unspecified: Secondary | ICD-10-CM | POA: Diagnosis not present

## 2020-12-03 DIAGNOSIS — E039 Hypothyroidism, unspecified: Secondary | ICD-10-CM | POA: Diagnosis present

## 2020-12-03 DIAGNOSIS — Z79899 Other long term (current) drug therapy: Secondary | ICD-10-CM | POA: Insufficient documentation

## 2020-12-03 DIAGNOSIS — F419 Anxiety disorder, unspecified: Secondary | ICD-10-CM | POA: Diagnosis present

## 2020-12-03 DIAGNOSIS — K625 Hemorrhage of anus and rectum: Secondary | ICD-10-CM

## 2020-12-03 DIAGNOSIS — Z20822 Contact with and (suspected) exposure to covid-19: Secondary | ICD-10-CM | POA: Diagnosis not present

## 2020-12-03 DIAGNOSIS — K529 Noninfective gastroenteritis and colitis, unspecified: Secondary | ICD-10-CM | POA: Diagnosis present

## 2020-12-03 LAB — CBC WITH DIFFERENTIAL (CANCER CENTER ONLY)
Abs Immature Granulocytes: 0.02 10*3/uL (ref 0.00–0.07)
Basophils Absolute: 0.1 10*3/uL (ref 0.0–0.1)
Basophils Relative: 1 %
Eosinophils Absolute: 0.2 10*3/uL (ref 0.0–0.5)
Eosinophils Relative: 3 %
HCT: 43 % (ref 36.0–46.0)
Hemoglobin: 13.2 g/dL (ref 12.0–15.0)
Immature Granulocytes: 0 %
Lymphocytes Relative: 33 %
Lymphs Abs: 2.2 10*3/uL (ref 0.7–4.0)
MCH: 26.7 pg (ref 26.0–34.0)
MCHC: 30.7 g/dL (ref 30.0–36.0)
MCV: 87 fL (ref 80.0–100.0)
Monocytes Absolute: 0.6 10*3/uL (ref 0.1–1.0)
Monocytes Relative: 9 %
Neutro Abs: 3.5 10*3/uL (ref 1.7–7.7)
Neutrophils Relative %: 54 %
Platelet Count: 289 10*3/uL (ref 150–400)
RBC: 4.94 MIL/uL (ref 3.87–5.11)
RDW: 17.2 % — ABNORMAL HIGH (ref 11.5–15.5)
WBC Count: 6.6 10*3/uL (ref 4.0–10.5)
nRBC: 0 % (ref 0.0–0.2)

## 2020-12-03 LAB — RETICULOCYTES
Immature Retic Fract: 18.7 % — ABNORMAL HIGH (ref 2.3–15.9)
RBC.: 4.89 MIL/uL (ref 3.87–5.11)
Retic Count, Absolute: 96.8 10*3/uL (ref 19.0–186.0)
Retic Ct Pct: 2 % (ref 0.4–3.1)

## 2020-12-03 LAB — BASIC METABOLIC PANEL
Anion gap: 7 (ref 5–15)
BUN: 18 mg/dL (ref 8–23)
CO2: 22 mmol/L (ref 22–32)
Calcium: 8.5 mg/dL — ABNORMAL LOW (ref 8.9–10.3)
Chloride: 109 mmol/L (ref 98–111)
Creatinine, Ser: 1.01 mg/dL — ABNORMAL HIGH (ref 0.44–1.00)
GFR, Estimated: 58 mL/min — ABNORMAL LOW (ref >60)
Glucose, Bld: 158 mg/dL — ABNORMAL HIGH (ref 70–99)
Potassium: 3.5 mmol/L (ref 3.5–5.1)
Sodium: 138 mmol/L (ref 135–145)

## 2020-12-03 LAB — CBC WITH DIFFERENTIAL/PLATELET
Abs Immature Granulocytes: 0.06 10*3/uL (ref 0.00–0.07)
Basophils Absolute: 0.1 10*3/uL (ref 0.0–0.1)
Basophils Relative: 0 %
Eosinophils Absolute: 0.1 10*3/uL (ref 0.0–0.5)
Eosinophils Relative: 1 %
HCT: 37.3 % (ref 36.0–46.0)
Hemoglobin: 11.3 g/dL — ABNORMAL LOW (ref 12.0–15.0)
Immature Granulocytes: 0 %
Lymphocytes Relative: 10 %
Lymphs Abs: 1.6 10*3/uL (ref 0.7–4.0)
MCH: 27.2 pg (ref 26.0–34.0)
MCHC: 30.3 g/dL (ref 30.0–36.0)
MCV: 89.9 fL (ref 80.0–100.0)
Monocytes Absolute: 0.9 10*3/uL (ref 0.1–1.0)
Monocytes Relative: 5 %
Neutro Abs: 14.4 10*3/uL — ABNORMAL HIGH (ref 1.7–7.7)
Neutrophils Relative %: 84 %
Platelets: 290 10*3/uL (ref 150–400)
RBC: 4.15 MIL/uL (ref 3.87–5.11)
RDW: 17.3 % — ABNORMAL HIGH (ref 11.5–15.5)
WBC: 17.1 10*3/uL — ABNORMAL HIGH (ref 4.0–10.5)
nRBC: 0 % (ref 0.0–0.2)

## 2020-12-03 LAB — POC OCCULT BLOOD, ED: Fecal Occult Bld: POSITIVE — AB

## 2020-12-03 LAB — IRON AND TIBC
Iron: 57 ug/dL (ref 28–170)
Saturation Ratios: 21 % (ref 10.4–31.8)
TIBC: 276 ug/dL (ref 250–450)
UIBC: 219 ug/dL

## 2020-12-03 LAB — FERRITIN: Ferritin: 87 ng/mL (ref 11–307)

## 2020-12-03 MED ORDER — SODIUM CHLORIDE 0.9 % IV SOLN
8.0000 mg/h | INTRAVENOUS | Status: DC
  Administered 2020-12-03: 8 mg/h via INTRAVENOUS
  Filled 2020-12-03: qty 80

## 2020-12-03 MED ORDER — PANTOPRAZOLE SODIUM 40 MG IV SOLR: 40.0000 mg | Freq: Two times a day (BID) | INTRAVENOUS | Status: DC

## 2020-12-03 MED ORDER — SODIUM CHLORIDE 0.9 % IV BOLUS
1000.0000 mL | Freq: Once | INTRAVENOUS | Status: AC
  Administered 2020-12-03: 1000 mL via INTRAVENOUS

## 2020-12-03 NOTE — H&P (Signed)
History and Physical    Rhonda Bailey SHF:[id number] DOB: 10-29-1945 DOA: 12/03/2020  PCP: [name], MD  Patient coming from: Home via EMS  I have personally briefly reviewed patient's old medical records in [place]  Chief Complaint: Rectal bleeding  HPI: Rhonda Bailey is a 75 y.o. female with medical history significant for IBD (Crohn's colitis versus pan ulcerative colitis per GI), iron deficiency anemia due to chronic blood loss, hemorrhoids, hypertension, hypothyroidism, GERD, anxiety who presents to the ED for evaluation of rectal bleeding.  Patient saw her hematology clinic morning of 4/29 for follow-up of iron deficiency anemia due to chronic blood loss with IBD which she has been managed with IV iron as needed. Labs were obtained and notable for hemoglobin 13.2 and ferritin 87.  Patient returned home and in the evening began to have significant bleeding from her rectum.  She says some of it was bright red but majority appeared black.  She says there was a large volume of bleeding.  She became anxious and took Xanax.  She says she did become lightheaded and passed out.  When she awoke she called EMS to come to the ED.  She denies any nausea, vomiting, abdominal pain, chest pain, dyspnea.  She denies using any blood thinners or NSAIDs.  ED Course:  Initial vitals showed BP 127/67, pulse 83, RR 16, temp 98.0 F, SPO2 98% on room air.  Labs show hemoglobin 11.3 (13.2 ten hours prior), hematocrit 37.3, WBC 17.1, platelets 290,000, sodium 138, potassium 3.5, bicarb 22, BUN 18, creatinine 1.01, serum glucose 158.  FOBT positive.  SARS-CoV-2 PCR is collected and pending.  Patient was given 1 L normal saline and placed on continuous IV Protonix infusion.  The hospitalist service was consulted to admit for further evaluation and management.  Review of Systems: All systems reviewed and are negative except as documented in history of present illness above.   Past Medical  History:  Diagnosis Date  . Anal fissure   . Anxiety   . Anxiety disorder, unspecified   . Essential (primary) hypertension   . Gastro-esophageal reflux disease with esophagitis   . GERD (gastroesophageal reflux disease)   . Hypokalemia   . Hypothyroidism   . Kidney stone   . Noninfective gastroenteritis and colitis, unspecified   . Sleep apnea     Past Surgical History:  Procedure Laterality Date  . BIOPSY  09/02/2018   Procedure: BIOPSY;  Surgeon: Daneil Dolin, MD;  Location: [facility];  Service: Endoscopy;;  colon   . CHOLECYSTECTOMY  05/19/2011   Procedure: LAPAROSCOPIC CHOLECYSTECTOMY;  Surgeon: Jamesetta So;  Location: [facility];  Service: General;  Laterality: N/A;  . COLONOSCOPY  11/2019   Dr. [name]:  rectal ulceration, TI and IC valve appeared normal, 8 mm polyp in proximal ascending colon that was sessile. Inflammation in transverse colon to cecum, inflammation with altered vascularity, edema, erosions, erythema, friability, shallow ulcerations in a continuous and circumferential pattern from rectum to splenix flexure, single twenty mm ulcer in distal rectum. Small and large-  . COLONOSCOPY WITH PROPOFOL N/A 09/02/2018   Dr. Gala Romney: Proctocolitis limited to the left side with some exudate versus partial pseudomembrane formation, erosions and ulceration somewhat superficial.  Terminal ileum normal.  Diverticulosis.  Descending/sigmoid colon biopsy showing severe active colitis, diagnostic features of IBD not identified.  Differential includes infection, ischemia.  Rectal biopsies with granulation tissue c/w ulcer  . CYSTOSCOPY W/ URETERAL STENT PLACEMENT Right 05/08/2019   Procedure:  CYSTOSCOPY WITH RETROGRADE PYELOGRAM/URETERAL STENT PLACEMENT;  Surgeon: Festus Aloe, MD;  Location: [facility];  Service: Urology;  Laterality: Right;  . CYSTOSCOPY W/ URETERAL STENT PLACEMENT Left 05/17/2020   Procedure: CYSTOSCOPY WITH RETROGRADE PYELOGRAM/URETERAL STENT PLACEMENT;   Surgeon: Robley Fries, MD;  Location: [facility];  Service: Urology;  Laterality: Left;  . CYSTOSCOPY WITH RETROGRADE PYELOGRAM, URETEROSCOPY AND STENT PLACEMENT Left 06/23/2020   Procedure: CYSTOSCOPY WITH LEFT RETROGRADE PYELOGRAM, LEFT URETEROSCOPY AND STENT EXCHANGE;  Surgeon: Cleon Gustin, MD;  Location: [facility];  Service: Urology;  Laterality: Left;  . CYSTOSCOPY WITH URETEROSCOPY Right 06/11/2019   Procedure: CYSTOSCOPY WITH DIAGNOSTIC URETEROSCOPY; URETERAL STENT REMOVAL;  Surgeon: Cleon Gustin, MD;  Location: [facility];  Service: Urology;  Laterality: Right;  . HEMORRHOID SURGERY N/A 09/16/2019   Procedure: EXTENSIVE HEMORRHOIDECTOMY;  Surgeon: Aviva Signs, MD;  Location: [facility];  Service: General;  Laterality: N/A;  . RECTAL BIOPSY N/A 09/16/2019   Procedure: BIOPSY RECTAL;  Surgeon: Aviva Signs, MD;  Location: [facility];  Service: General;  Laterality: N/A;  . STONE EXTRACTION WITH BASKET  06/23/2020   Procedure: LEFT URETERAL STONE EXTRACTION WITH BASKET;  Surgeon: Cleon Gustin, MD;  Location: [facility];  Service: Urology;;  . TUBAL LIGATION      Social History:  reports that she has never smoked. She has never used smokeless tobacco. She reports previous alcohol use. She reports that she does not use drugs.  Allergies  Allergen Reactions  . Tramadol Other (See Comments)    Says "blew my head up."    Family History  Problem Relation Age of Onset  . Cancer Father        mouth  . Heart disease Sister   . Heart disease Brother   . Cancer Brother        head  . Cancer Daughter        ? type  . Anesthesia problems Neg Hx   . Hypotension Neg Hx   . Malignant hyperthermia Neg Hx   . Pseudochol deficiency Neg Hx   . Colon cancer Neg Hx   . Esophageal cancer Neg Hx   . Inflammatory bowel disease Neg Hx   . Liver disease Neg Hx   . Pancreatic cancer Neg Hx   . Rectal cancer Neg Hx   . Stomach cancer Neg Hx      Prior to Admission medications   Medication  Sig Start Date End Date Taking? Authorizing Provider  acidophilus (RISAQUAD) CAPS capsule Take 1 capsule by mouth daily.   Yes [provider]  ALPRAZolam Duanne Moron) 0.5 MG tablet Take 0.5 mg by mouth at bedtime as needed for sleep.   Yes [provider]  amLODipine (NORVASC) 2.5 MG tablet Take 1 tablet (2.5 mg total) by mouth daily as needed (high BP). Patient taking differently: Take 2.5 mg by mouth daily. 05/27/20  Yes Eugenie Filler, MD  gabapentin (NEURONTIN) 300 MG capsule Take 300 mg by mouth in the morning and at bedtime. 11/17/20  Yes [provider]  levothyroxine (SYNTHROID) 50 MCG tablet Take 50 mcg by mouth every morning. 11/10/20  Yes [provider]  Multiple Vitamin (MULTIVITAMIN WITH MINERALS) TABS tablet Take 1 tablet by mouth daily.   Yes [provider]  pantoprazole (PROTONIX) 40 MG tablet Take 1 tablet (40 mg total) by mouth 2 (two) times daily before a meal. Patient taking differently: Take 40 mg by mouth daily. 05/24/20  Yes Eugenie Filler, MD  sodium bicarbonate 650 MG tablet Take 1 tablet (650 mg total) by mouth daily. 06/30/20  Yes McKenzie, Candee Furbish, MD  sulfaSALAzine (AZULFIDINE) 500 MG tablet TAKE 1 TABLET BY MOUTH TWICE A DAY Patient taking differently: Take 500 mg by mouth 2 (two) times daily. 11/11/20  Yes Mansouraty, Telford Nab., MD  budesonide (ENTOCORT EC) 3 MG 24 hr capsule Take 3 capsules (9 mg total) by mouth daily. Patient not taking: Reported on 12/03/2020 05/25/20   Eugenie Filler, MD    Physical Exam: Vitals:   12/03/20 2115 12/03/20 2130 12/03/20 2145 12/03/20 2215  BP: 127/67 (!) 125/53 93/72 125/62  Pulse: 83 83 80 77  Resp:  16 (!) 21 18  Temp: 98 F (36.7 C)     TempSrc: Oral     SpO2: 98% 97% 97% 98%  Weight:      Height:       Constitutional: Resting supine in bed, NAD, calm, comfortable Eyes: PERRL, lids and conjunctivae normal ENMT: Mucous membranes are moist. Posterior pharynx clear  of any exudate or lesions.Normal dentition.  Neck: normal, supple, no masses. Respiratory: clear to auscultation bilaterally, no wheezing, no crackles. Normal respiratory effort. No accessory muscle use.  Cardiovascular: Regular rate and rhythm, no murmurs / rubs / gallops. No extremity edema. 2+ pedal pulses. Abdomen: no tenderness, no masses palpated. Bowel sounds positive.  Musculoskeletal: no clubbing / cyanosis. No joint deformity upper and lower extremities. Good ROM, no contractures. Normal muscle tone.  Skin: no rashes, lesions, ulcers. No induration Neurologic: CN 2-12 grossly intact. Sensation intact. Strength 5/5 in all 4.  Psychiatric: Normal judgment and insight. Alert and oriented x 3. Normal mood.   Labs on Admission: I have personally reviewed following labs and imaging studies  CBC: Recent Labs  Lab 12/03/20 1043 12/03/20 2141  WBC 6.6 17.1*  NEUTROABS 3.5 14.4*  HGB 13.2 11.3*  HCT 43.0 37.3  MCV 87.0 89.9  PLT 289 308   Basic Metabolic Panel: Recent Labs  Lab 12/03/20 2141  NA 138  K 3.5  CL 109  CO2 22  GLUCOSE 158*  BUN 18  CREATININE 1.01*  CALCIUM 8.5*   GFR: Estimated Creatinine Clearance: 49.8 mL/min (A) (by C-G formula based on SCr of 1.01 mg/dL (H)). Liver Function Tests: No results for input(s): AST, ALT, ALKPHOS, BILITOT, PROT, ALBUMIN in the last 168 hours. No results for input(s): LIPASE, AMYLASE in the last 168 hours. No results for input(s): AMMONIA in the last 168 hours. Coagulation Profile: No results for input(s): INR, PROTIME in the last 168 hours. Cardiac Enzymes: No results for input(s): CKTOTAL, CKMB, CKMBINDEX, TROPONINI in the last 168 hours. BNP (last 3 results) No results for input(s): PROBNP in the last 8760 hours. HbA1C: No results for input(s): HGBA1C in the last 72 hours. CBG: No results for input(s): GLUCAP in the last 168 hours. Lipid Profile: No results for input(s): CHOL, HDL, LDLCALC, TRIG, CHOLHDL, LDLDIRECT  in the last 72 hours. Thyroid Function Tests: No results for input(s): TSH, T4TOTAL, FREET4, T3FREE, THYROIDAB in the last 72 hours. Anemia Panel: Recent Labs    12/03/20 1042  FERRITIN 87  TIBC 276  IRON 57  RETICCTPCT 2.0   Urine analysis:    Component Value Date/Time   COLORURINE AMBER (A) 05/18/2020 0418   APPEARANCEUR Clear 06/30/2020 1344   LABSPEC 1.010 05/18/2020 0418   PHURINE 5.0 05/18/2020 0418   GLUCOSEU Negative 06/30/2020 1344   GLUCOSEU NEGATIVE 04/23/2020 1102   HGBUR LARGE (A)  05/18/2020 0418   BILIRUBINUR Negative 06/30/2020 1344   KETONESUR 5 (A) 05/18/2020 0418   PROTEINUR 1+ (A) 06/30/2020 1344   PROTEINUR 30 (A) 05/18/2020 0418   UROBILINOGEN 0.2 04/23/2020 1102   NITRITE Negative 06/30/2020 1344   NITRITE NEGATIVE 05/18/2020 0418   LEUKOCYTESUR Trace (A) 06/30/2020 1344   LEUKOCYTESUR LARGE (A) 05/18/2020 0418    Radiological Exams on Admission: No results found.  EKG: Personally reviewed. Normal sinus rhythm without acute ischemic changes, early R wave transition.  Not significantly changed when compared to prior.  Assessment/Plan Principal Problem:   Acute GI bleeding Active Problems:   Hypothyroidism   Anxiety disorder, unspecified   Essential (primary) hypertension   IBD (inflammatory bowel disease)   Iron deficiency anemia due to chronic blood loss   Rhonda Bailey is a 75 y.o. female with medical history significant for IBD (Crohn's colitis versus pan ulcerative colitis per GI), iron deficiency anemia due to chronic blood loss, hemorrhoids, hypertension, hypothyroidism, GERD, anxiety who is admitted with symptomatic acute GI bleed.  Symptomatic acute GI bleed Iron deficiency anemia due to chronic blood loss Hemorrhoids IBD (Crohn's colitis versus pan ulcerative colitis): Reports significant rectal bleeding, mixed appearance of bright red and dark black appearance per patient.  Hemoglobin 11.3 on admission.  Denies any abdominal or  rectal pain. -Repeat CBC, transfuse with PRBC if hemoglobin has significant drop -DC continuous Protonix, switch to IV Protonix 40 mg twice daily -Start IV fluid hydration overnight  Hypertension: Holding amlodipine for now.  Hypothyroidism: Continue Synthroid.  Anxiety: Holding home Xanax.  She says she uses only as needed, approximately once a month.  DVT prophylaxis: SCDs Code Status: Full code, confirmed with patient on admission Family Communication: Discussed with patient, she has discussed with family Disposition Plan: From home and likely discharged home pending further evaluation Consults called: None Level of care: Med-Surg Admission status:  Status is: Observation  The patient remains OBS appropriate and will d/c before 2 midnights.  Dispo: The patient is from: Home              Anticipated d/c is to: Home              Patient currently is not medically stable to d/c.   Difficult to place patient No  Zada Finders MD [facility]  If 7PM-7AM, please contact night-coverage [email or web address]  12/03/2020, 11:22 PM

## 2020-12-03 NOTE — ED Provider Notes (Signed)
[facility] EMERGENCY DEPARTMENT Provider Note   CSN: [id number] Arrival date & time: 12/03/20  2104     History Chief Complaint  Patient presents with  . Rectal Bleeding    Rhonda Bailey is a 75 y.o. female.  Patient presents chief complaint of rectal bleeding.  She was just seen at the cancer center for history of inflammatory bowel syndrome had labs done earlier today that were normal.  She was at home when she suddenly had a gush of bright red blood from her rectum.  She got lightheaded and passed out.  She noticed blood all around her and came into the ER.  She states that she is no longer bleeding.  Denies headache or neck pain.  No chest pain or abdominal pain.        Past Medical History:  Diagnosis Date  . Anal fissure   . Anxiety   . Anxiety disorder, unspecified   . Essential (primary) hypertension   . Gastro-esophageal reflux disease with esophagitis   . GERD (gastroesophageal reflux disease)   . Hypokalemia   . Hypothyroidism   . Kidney stone   . Noninfective gastroenteritis and colitis, unspecified   . Sleep apnea     Patient Active Problem List   Diagnosis Date Noted  . Acute cystitis without hematuria 06/30/2020  . Dysuria 06/30/2020  . History of kidney stones 06/06/2020  . Hypoxia 05/20/2020  . Bacterial infection due to Proteus mirabilis 05/20/2020  . Sepsis due to gram-negative urinary tract infection ([place])   . Hypervolemia   . Bacteremia   . Abnormal CT scan, gastrointestinal tract   . Acute renal failure ([place])   . Anxiety   . Sepsis ([place]) 05/18/2020  . Acute right-sided low back pain without sciatica 04/23/2020  . Immunosuppression ([place]) 04/23/2020  . Right flank pain 04/23/2020  . Long term current use of immunosuppressive drug 03/15/2020  . Rectal bleeding 12/13/2019  . IBD (inflammatory bowel disease) 12/12/2019  . Crohn's disease of colon with rectal bleeding ([place]) 12/12/2019  . Iron deficiency anemia due to  chronic blood loss 12/12/2019  . Diarrhea   . Colitis with fistula 10/13/2019  . Rectal ulcer   . Hemorrhoids 07/29/2019  . Anxiety disorder, unspecified   . Gastro-esophageal reflux disease with esophagitis   . Essential (primary) hypertension   . Noninfective gastroenteritis and colitis, unspecified   . Pyelonephritis   . Sepsis with acute organ dysfunction ([place]) 05/08/2019  . Nephrolithiasis 05/08/2019  . Rigors 05/08/2019  . Hypothyroidism   . UTI (urinary tract infection)   . Gross hematuria   . Renal colic on right side   . Ureteral stone with hydronephrosis   . Dehydration   . AKI (acute kidney injury) ([place])   . Thrombocytosis   . Dysphagia 01/21/2019  . Microcytic anemia 01/21/2019  . Anemia 08/29/2018  . Insomnia 08/29/2018  . Abnormal CT of the abdomen 08/29/2018  . Diarrhea of infectious origin   . Colitis 08/28/2018  . Adrenal nodule ([place]) 08/28/2018  . Hypokalemia 08/28/2018  . Dehydration with hyponatremia 08/28/2018  . Leukocytosis 08/28/2018    Past Surgical History:  Procedure Laterality Date  . BIOPSY  09/02/2018   Procedure: BIOPSY;  Surgeon: Daneil Dolin, MD;  Location: [facility];  Service: Endoscopy;;  colon   . CHOLECYSTECTOMY  05/19/2011   Procedure: LAPAROSCOPIC CHOLECYSTECTOMY;  Surgeon: Jamesetta So;  Location: [facility];  Service: General;  Laterality: N/A;  . COLONOSCOPY  11/2019  Dr. [name]:  rectal ulceration, TI and IC valve appeared normal, 8 mm polyp in proximal ascending colon that was sessile. Inflammation in transverse colon to cecum, inflammation with altered vascularity, edema, erosions, erythema, friability, shallow ulcerations in a continuous and circumferential pattern from rectum to splenix flexure, single twenty mm ulcer in distal rectum. Small and large-  . COLONOSCOPY WITH PROPOFOL N/A 09/02/2018   Dr. Gala Romney: Proctocolitis limited to the left side with some exudate versus partial pseudomembrane formation, erosions and  ulceration somewhat superficial.  Terminal ileum normal.  Diverticulosis.  Descending/sigmoid colon biopsy showing severe active colitis, diagnostic features of IBD not identified.  Differential includes infection, ischemia.  Rectal biopsies with granulation tissue c/w ulcer  . CYSTOSCOPY W/ URETERAL STENT PLACEMENT Right 05/08/2019   Procedure: CYSTOSCOPY WITH RETROGRADE PYELOGRAM/URETERAL STENT PLACEMENT;  Surgeon: Festus Aloe, MD;  Location: [facility];  Service: Urology;  Laterality: Right;  . CYSTOSCOPY W/ URETERAL STENT PLACEMENT Left 05/17/2020   Procedure: CYSTOSCOPY WITH RETROGRADE PYELOGRAM/URETERAL STENT PLACEMENT;  Surgeon: Robley Fries, MD;  Location: [facility];  Service: Urology;  Laterality: Left;  . CYSTOSCOPY WITH RETROGRADE PYELOGRAM, URETEROSCOPY AND STENT PLACEMENT Left 06/23/2020   Procedure: CYSTOSCOPY WITH LEFT RETROGRADE PYELOGRAM, LEFT URETEROSCOPY AND STENT EXCHANGE;  Surgeon: Cleon Gustin, MD;  Location: [facility];  Service: Urology;  Laterality: Left;  . CYSTOSCOPY WITH URETEROSCOPY Right 06/11/2019   Procedure: CYSTOSCOPY WITH DIAGNOSTIC URETEROSCOPY; URETERAL STENT REMOVAL;  Surgeon: Cleon Gustin, MD;  Location: [facility];  Service: Urology;  Laterality: Right;  . HEMORRHOID SURGERY N/A 09/16/2019   Procedure: EXTENSIVE HEMORRHOIDECTOMY;  Surgeon: Aviva Signs, MD;  Location: [facility];  Service: General;  Laterality: N/A;  . RECTAL BIOPSY N/A 09/16/2019   Procedure: BIOPSY RECTAL;  Surgeon: Aviva Signs, MD;  Location: [facility];  Service: General;  Laterality: N/A;  . STONE EXTRACTION WITH BASKET  06/23/2020   Procedure: LEFT URETERAL STONE EXTRACTION WITH BASKET;  Surgeon: Cleon Gustin, MD;  Location: [facility];  Service: Urology;;  . TUBAL LIGATION       OB History   No obstetric history on file.     Family History  Problem Relation Age of Onset  . Cancer Father        mouth  . Heart disease Sister   . Heart disease Brother   . Cancer Brother         head  . Cancer Daughter        ? type  . Anesthesia problems Neg Hx   . Hypotension Neg Hx   . Malignant hyperthermia Neg Hx   . Pseudochol deficiency Neg Hx   . Colon cancer Neg Hx   . Esophageal cancer Neg Hx   . Inflammatory bowel disease Neg Hx   . Liver disease Neg Hx   . Pancreatic cancer Neg Hx   . Rectal cancer Neg Hx   . Stomach cancer Neg Hx     Social History   Tobacco Use  . Smoking status: Never Smoker  . Smokeless tobacco: Never Used  Vaping Use  . Vaping Use: Never used  Substance Use Topics  . Alcohol use: Not Currently    Comment: once a year  . Drug use: No    Home Medications Prior to Admission medications   Medication Sig Start Date End Date Taking? Authorizing Provider  acidophilus (RISAQUAD) CAPS capsule Take 1 capsule by mouth daily.    [provider]  ALPRAZolam Duanne Moron) 0.5 MG tablet Take 0.5 mg  by mouth at bedtime as needed for sleep.    [provider]  amLODipine (NORVASC) 2.5 MG tablet Take 1 tablet (2.5 mg total) by mouth daily as needed (high BP). 05/27/20   Eugenie Filler, MD  budesonide (ENTOCORT EC) 3 MG 24 hr capsule Take 3 capsules (9 mg total) by mouth daily. 05/25/20   Eugenie Filler, MD  levothyroxine (SYNTHROID) 25 MCG tablet Take 25 mcg by mouth daily before breakfast.    [provider]  Multiple Vitamin (MULTIVITAMIN WITH MINERALS) TABS tablet Take 1 tablet by mouth daily.    [provider]  pantoprazole (PROTONIX) 40 MG tablet Take 1 tablet (40 mg total) by mouth 2 (two) times daily before a meal. Patient taking differently: Take 40 mg by mouth daily. 05/24/20   Eugenie Filler, MD  sodium bicarbonate 650 MG tablet Take 1 tablet (650 mg total) by mouth daily. 06/30/20   McKenzie, Candee Furbish, MD  sulfaSALAzine (AZULFIDINE) 500 MG tablet TAKE 1 TABLET BY MOUTH TWICE A DAY 11/11/20   Mansouraty, Telford Nab., MD    Allergies    Tramadol  Review of Systems   Review of Systems   Constitutional: Negative for fever.  HENT: Negative for ear pain.   Eyes: Negative for pain.  Respiratory: Negative for cough.   Cardiovascular: Negative for chest pain.  Gastrointestinal: Negative for abdominal pain.  Genitourinary: Negative for flank pain.  Musculoskeletal: Negative for back pain.  Skin: Negative for rash.  Neurological: Positive for syncope. Negative for headaches.    Physical Exam Updated Vital Signs BP 125/62   Pulse 77   Temp 98 F (36.7 C) (Oral)   Resp 18   Ht 5' 6"  (1.676 m)   Wt 74.8 kg   SpO2 98%   BMI 26.63 kg/m   Physical Exam Constitutional:      General: She is not in acute distress.    Appearance: Normal appearance.  HENT:     Head: Normocephalic.     Nose: Nose normal.  Eyes:     Extraocular Movements: Extraocular movements intact.  Cardiovascular:     Rate and Rhythm: Normal rate.  Pulmonary:     Effort: Pulmonary effort is normal.  Musculoskeletal:        General: Normal range of motion.     Cervical back: Normal range of motion.  Neurological:     General: No focal deficit present.     Mental Status: She is alert and oriented to person, place, and time. Mental status is at baseline.     Cranial Nerves: No cranial nerve deficit.     Motor: No weakness.     Gait: Gait normal.     ED Results / Procedures / Treatments   Labs (all labs ordered are listed, but only abnormal results are displayed) Labs Reviewed  BASIC METABOLIC PANEL - Abnormal; Notable for the following components:      Result Value   Glucose, Bld 158 (*)    Creatinine, Ser 1.01 (*)    Calcium 8.5 (*)    GFR, Estimated 58 (*)    All other components within normal limits  CBC WITH DIFFERENTIAL/PLATELET - Abnormal; Notable for the following components:   WBC 17.1 (*)    Hemoglobin 11.3 (*)    RDW 17.3 (*)    Neutro Abs 14.4 (*)    All other components within normal limits  POC OCCULT BLOOD, ED - Abnormal; Notable for the following components:   Fecal  Occult Bld POSITIVE (*)    All other components within normal limits  RESP PANEL BY RT-PCR (FLU A&B, COVID) ARPGX2  URINALYSIS, ROUTINE W REFLEX MICROSCOPIC    EKG None  Radiology No results found.  Procedures .Critical Care Performed by: Luna Fuse, MD Authorized by: Luna Fuse, MD   Critical care provider statement:    Critical care time (minutes):  30   Critical care time was exclusive of:  Separately billable procedures and treating other patients   Critical care was necessary to treat or prevent imminent or life-threatening deterioration of the following conditions:  CNS failure or compromise Comments:     GI bleed with syncope requiring IV Protonix drip.     Medications Ordered in ED Medications  pantoprazole (PROTONIX) 80 mg in sodium chloride 0.9 % 100 mL (0.8 mg/mL) infusion (has no administration in time range)  pantoprazole (PROTONIX) injection 40 mg (has no administration in time range)  sodium chloride 0.9 % bolus 1,000 mL (0 mLs Intravenous Stopped 12/03/20 2300)    ED Course  I have reviewed the triage vital signs and the nursing notes.  Pertinent labs & imaging results that were available during my care of the patient were reviewed by me and considered in my medical decision making (see chart for details).    MDM Rules/Calculators/A&P                          Patient is well-appearing on exam blood pressure shows slightly low levels but otherwise within normal limits.  Abdominal is benign no guarding or rebound noted. Hemoglobin is low at 11.  Hemoglobin just a few hours ago was 13.  Rectal exam shows brown/light red-colored stool that is guaiac positive.  Given her syncopal episode and hemoglobin drop, will be brought into the hospitalist team for admission.  Final Clinical Impression(s) / ED hemoglobin is diagnoses Final diagnoses:  Rectal bleeding    Rx / DC Orders ED Discharge Orders    None       Luna Fuse, MD 12/03/20  2302

## 2020-12-03 NOTE — ED Triage Notes (Signed)
BIB [facility] from home. Pt states about 3 hours ago pt started having large bright red bloody BM with clots. Pt states she has passed out a few times. When EMS arrived pt SBP 90. 356m given.   100/72

## 2020-12-03 NOTE — Progress Notes (Signed)
Hematology and Oncology Follow Up Visit  Rhonda Bailey [id number] [birth year] 75 y.o. 12/03/2020   Principle Diagnosis:  Iron deficiency anemia due to chronic blood loss with IBS (Crohn's vs. UC)  Current Therapy:  IV iron as indicated    Interim History:  Rhonda Bailey is here today for follow-up. She is doing well and has no complaints at this time.  Hgb is now 13.2, MCV 87, platelets 289 and WBC count 6.6.  She has maintained a good appetite an is staying well hydrated. Her weight is stable at 165 lbs.  No blood loss noted. No bruising or petechiae.  No fever, chills, n/v, cough, rash, dizziness, SOB, chest pain, palpitations, abdominal pain or changes in bowel or bladder habits.  She has a non bleeding hemorrhoid that can be uncomfortable at times.  No swelling, tenderness, numbness or tingling in her extremities.  No falls or syncope.   ECOG Performance Status: 0 - Asymptomatic  Medications:  Allergies as of 12/03/2020      Reactions   Tramadol Other (See Comments)   Says "blew my head up."      Medication List       Accurate as of December 03, 2020 11:15 AM. If you have any questions, ask your nurse or doctor.        acidophilus Caps capsule Take 1 capsule by mouth daily.   ALPRAZolam 0.5 MG tablet Commonly known as: XANAX Take 0.5 mg by mouth at bedtime as needed for sleep.   amLODipine 2.5 MG tablet Commonly known as: NORVASC Take 1 tablet (2.5 mg total) by mouth daily as needed (high BP).   budesonide 3 MG 24 hr capsule Commonly known as: ENTOCORT EC Take 3 capsules (9 mg total) by mouth daily.   levothyroxine 25 MCG tablet Commonly known as: SYNTHROID Take 25 mcg by mouth daily before breakfast.   multivitamin with minerals Tabs tablet Take 1 tablet by mouth daily.   pantoprazole 40 MG tablet Commonly known as: PROTONIX Take 1 tablet (40 mg total) by mouth 2 (two) times daily before a meal. What changed: when to take this   sodium bicarbonate 650 MG  tablet Take 1 tablet (650 mg total) by mouth daily.   sulfaSALAzine 500 MG tablet Commonly known as: AZULFIDINE TAKE 1 TABLET BY MOUTH TWICE A DAY       Allergies:  Allergies  Allergen Reactions  . Tramadol Other (See Comments)    Says "blew my head up."    Past Medical History, Surgical history, Social history, and Family History were reviewed and updated.  Review of Systems: All other 10 point review of systems is negative.   Physical Exam:  vitals were not taken for this visit.   Wt Readings from Last 3 Encounters:  08/30/20 150 lb (68 kg)  06/30/20 155 lb (70.3 kg)  06/28/20 155 lb (70.3 kg)    Ocular: Sclerae unicteric, pupils equal, round and reactive to light Ear-nose-throat: Oropharynx clear, dentition fair Lymphatic: No cervical or supraclavicular adenopathy Lungs no rales or rhonchi, good excursion bilaterally Heart regular rate and rhythm, no murmur appreciated Abd soft, nontender, positive bowel sounds MSK no focal spinal tenderness, no joint edema Neuro: non-focal, well-oriented, appropriate affect Breasts: Deferred   Lab Results  Component Value Date   WBC 6.6 12/03/2020   HGB 13.2 12/03/2020   HCT 43.0 12/03/2020   MCV 87.0 12/03/2020   PLT 289 12/03/2020   Lab Results  Component Value Date   FERRITIN 358 (H)  08/30/2020   IRON 72 08/30/2020   TIBC 231 (L) 08/30/2020   UIBC 160 08/30/2020   IRONPCTSAT 31 08/30/2020   Lab Results  Component Value Date   RETICCTPCT 2.0 12/03/2020   RBC 4.94 12/03/2020   RETICCTABS 62,860 12/09/2019   No results found for: Nils Pyle [facility] Lab Results  Component Value Date   IGGSERUM 870 12/09/2019   No results found for: Odetta Pink, SPEI   Chemistry      Component Value Date/Time   NA 141 08/30/2020 0813   NA 142 03/19/2019 0000   K 4.5 08/30/2020 0813   CL 106 08/30/2020 0813   CO2 26 08/30/2020 0813   BUN 18  08/30/2020 0813   BUN 16 03/19/2019 0000   CREATININE 1.14 (H) 08/30/2020 0813   GLU 101 03/19/2019 0000      Component Value Date/Time   CALCIUM 10.1 08/30/2020 0813   ALKPHOS 89 08/30/2020 0813   AST 17 08/30/2020 0813   ALT 11 08/30/2020 0813   BILITOT 0.4 08/30/2020 0813       Impression and Plan: Rhonda Bailey is a very pleasant 75 yo caucasian female with iron deficiency secondary to chronic blood loss with IBS (Crohn's vs. UC).  Iron studies are pending. We will replace if needed.  Follow-up in another 4 months.  She can contact our office with any questions or concerns.   Rhonda Peace, NP 4/29/202211:15 AM

## 2020-12-04 DIAGNOSIS — K922 Gastrointestinal hemorrhage, unspecified: Secondary | ICD-10-CM | POA: Diagnosis not present

## 2020-12-04 DIAGNOSIS — K921 Melena: Secondary | ICD-10-CM

## 2020-12-04 DIAGNOSIS — K5731 Diverticulosis of large intestine without perforation or abscess with bleeding: Secondary | ICD-10-CM | POA: Diagnosis not present

## 2020-12-04 DIAGNOSIS — D62 Acute posthemorrhagic anemia: Secondary | ICD-10-CM | POA: Diagnosis not present

## 2020-12-04 LAB — CBC
HCT: 32.7 % — ABNORMAL LOW (ref 36.0–46.0)
Hemoglobin: 10.1 g/dL — ABNORMAL LOW (ref 12.0–15.0)
MCH: 27.5 pg (ref 26.0–34.0)
MCHC: 30.9 g/dL (ref 30.0–36.0)
MCV: 89.1 fL (ref 80.0–100.0)
Platelets: 252 10*3/uL (ref 150–400)
RBC: 3.67 MIL/uL — ABNORMAL LOW (ref 3.87–5.11)
RDW: 17.3 % — ABNORMAL HIGH (ref 11.5–15.5)
WBC: 11 10*3/uL — ABNORMAL HIGH (ref 4.0–10.5)
nRBC: 0 % (ref 0.0–0.2)

## 2020-12-04 LAB — BASIC METABOLIC PANEL
Anion gap: 8 (ref 5–15)
BUN: 15 mg/dL (ref 8–23)
CO2: 25 mmol/L (ref 22–32)
Calcium: 8.3 mg/dL — ABNORMAL LOW (ref 8.9–10.3)
Chloride: 108 mmol/L (ref 98–111)
Creatinine, Ser: 0.94 mg/dL (ref 0.44–1.00)
GFR, Estimated: >60 mL/min (ref >60)
Glucose, Bld: 119 mg/dL — ABNORMAL HIGH (ref 70–99)
Potassium: 4 mmol/L (ref 3.5–5.1)
Sodium: 141 mmol/L (ref 135–145)

## 2020-12-04 LAB — RESP PANEL BY RT-PCR (FLU A&B, COVID) ARPGX2
Influenza A by PCR: NEGATIVE
Influenza B by PCR: NEGATIVE
SARS Coronavirus 2 by RT PCR: NEGATIVE

## 2020-12-04 LAB — HEMOGLOBIN AND HEMATOCRIT, BLOOD
HCT: 29.1 % — ABNORMAL LOW (ref 36.0–46.0)
Hemoglobin: 8.9 g/dL — ABNORMAL LOW (ref 12.0–15.0)

## 2020-12-04 MED ORDER — ALPRAZOLAM 0.5 MG PO TABS
0.5000 mg | ORAL_TABLET | Freq: Every evening | ORAL | Status: DC | PRN
  Administered 2020-12-04 – 2020-12-05 (×2): 0.5 mg via ORAL
  Filled 2020-12-04 (×2): qty 1

## 2020-12-04 MED ORDER — ONDANSETRON HCL 4 MG/2ML IJ SOLN: 4.0000 mg | Freq: Four times a day (QID) | INTRAMUSCULAR | Status: DC | PRN

## 2020-12-04 MED ORDER — ACETAMINOPHEN 650 MG RE SUPP: 650.0000 mg | Freq: Four times a day (QID) | RECTAL | Status: DC | PRN

## 2020-12-04 MED ORDER — LACTATED RINGERS IV SOLN: INTRAVENOUS | Status: AC

## 2020-12-04 MED ORDER — ONDANSETRON HCL 4 MG PO TABS: 4.0000 mg | ORAL_TABLET | Freq: Four times a day (QID) | ORAL | Status: DC | PRN

## 2020-12-04 MED ORDER — ACETAMINOPHEN 325 MG PO TABS
650.0000 mg | ORAL_TABLET | Freq: Four times a day (QID) | ORAL | Status: DC | PRN
Start: 2020-12-04 — End: 2020-12-06
  Administered 2020-12-04 – 2020-12-06 (×2): 650 mg via ORAL
  Filled 2020-12-04 (×2): qty 2

## 2020-12-04 MED ORDER — RISAQUAD PO CAPS
1.0000 | ORAL_CAPSULE | Freq: Every day | ORAL | Status: DC
  Administered 2020-12-04 – 2020-12-06 (×3): 1 via ORAL
  Filled 2020-12-04 (×3): qty 1

## 2020-12-04 MED ORDER — GABAPENTIN 300 MG PO CAPS
300.0000 mg | ORAL_CAPSULE | Freq: Two times a day (BID) | ORAL | Status: DC
  Administered 2020-12-04 – 2020-12-06 (×5): 300 mg via ORAL
  Filled 2020-12-04 (×5): qty 1

## 2020-12-04 MED ORDER — LEVOTHYROXINE SODIUM 50 MCG PO TABS
50.0000 ug | ORAL_TABLET | Freq: Every day | ORAL | Status: DC
  Administered 2020-12-04 – 2020-12-06 (×3): 50 ug via ORAL
  Filled 2020-12-04 (×3): qty 1

## 2020-12-04 MED ORDER — SODIUM BICARBONATE 650 MG PO TABS
650.0000 mg | ORAL_TABLET | Freq: Every day | ORAL | Status: DC
  Administered 2020-12-04 – 2020-12-06 (×3): 650 mg via ORAL
  Filled 2020-12-04 (×3): qty 1

## 2020-12-04 MED ORDER — PANTOPRAZOLE SODIUM 40 MG IV SOLR
40.0000 mg | Freq: Two times a day (BID) | INTRAVENOUS | Status: DC
  Administered 2020-12-04 – 2020-12-06 (×6): 40 mg via INTRAVENOUS
  Filled 2020-12-04 (×6): qty 40

## 2020-12-04 MED ORDER — SULFASALAZINE 500 MG PO TABS
500.0000 mg | ORAL_TABLET | Freq: Two times a day (BID) | ORAL | Status: DC
  Administered 2020-12-04 – 2020-12-06 (×5): 500 mg via ORAL
  Filled 2020-12-04 (×6): qty 1

## 2020-12-04 NOTE — Progress Notes (Signed)
PROGRESS NOTE    Rhonda Bailey  TKZ:[id number] DOB: 1946/02/18 DOA: 12/03/2020 PCP: [name], MD    Brief Narrative:  75 year old female with history of IBD, rectal ulcer, and since anemia due to chronic blood loss, hemorrhoids, hypertension, hypothyroidism, GERD and anxiety presented to the ER with rectal bleeding.  Patient also goes to hematology office for iron transfusions.  She had occasional rectal bleeding but on the day of admission had large volume painless rectal bleeding.  She became anxious and took 1 dose of Xanax, became lightheaded and passed out.  She woke up and called EMS and came to ER. In the emergency room hemoglobin 11.3, 13.2 few hours ago.  WBC 17.1.  Admitted due to significant symptoms.   Assessment & Plan:   Principal Problem:   Acute GI bleeding Active Problems:   Hypothyroidism   Anxiety disorder, unspecified   Essential (primary) hypertension   IBD (inflammatory bowel disease)   Iron deficiency anemia due to chronic blood loss  Bright red blood per rectum/painless rectal bleeding: Suspected diverticular bleed or bleeding from her previous rectal ulcer.  Currently clinical evidence of slowing down bleeding. Hemoglobin is 11, continue to monitor every 12 hours and transfuse for any hemodynamically instability or hemoglobin less than 8 with ongoing bleeding.  Recheck in the evening.  Continue IV fluids.  Continue Protonix. Called and discussed with gastroenterology, seen in consultation.  Planning conservative management, colonoscopy if ongoing bleeding.  Regional bowel syndrome: Patient currently on sulfasalazine that is continued.  Hypertension: Blood pressure is stable.  Holding antihypertensives now.  Hypothyroidism: On Synthroid.  Continued.  Anxiety disorder: Uses Xanax at night.  She can use it.    DVT prophylaxis: SCDs Start: 12/04/20 0110   Code Status: Full code Family Communication: None Disposition Plan: Status is:  Observation  The patient will require care spanning > 2 midnights and should be moved to inpatient because: Inpatient level of care appropriate due to severity of illness  Dispo: The patient is from: Home              Anticipated d/c is to: Home              Patient currently is not medically stable to d/c.   Difficult to place patient No         Consultants:   Gastroenterology  Procedures:   None  Antimicrobials:   None   Subjective: Patient seen and examined.  Could not sleep all night.  Denies any abdominal pain nausea vomiting.  She had 1 small bowel movement with minimal trace of blood today morning. She wants to avoid need for colonoscopy as much as possible.  Objective: Vitals:   12/04/20 0134 12/04/20 0351 12/04/20 0949 12/04/20 1303  BP: (!) 128/54 123/61 (!) 117/55 (!) 120/41  Pulse: 75 79 78 79  Resp: 17 16 18 19   Temp: 97.8 F (36.6 C) 97.8 F (36.6 C) 98.5 F (36.9 C) 97.9 F (36.6 C)  TempSrc: Oral Oral Oral Oral  SpO2: 98% 96% 97% 96%  Weight:      Height:        Intake/Output Summary (Last 24 hours) at 12/04/2020 1425 Last data filed at 12/04/2020 0700 Gross per 24 hour  Intake 1885.86 ml  Output --  Net 1885.86 ml   Filed Weights   12/03/20 2111  Weight: 74.8 kg    Examination:  General exam: Appears calm and comfortable, slightly anxious. Respiratory system: Clear to auscultation. Respiratory effort normal. Cardiovascular  system: S1 & S2 heard, RRR. No JVD, murmurs, rubs, gallops or clicks. No pedal edema. Gastrointestinal system: Abdomen is nondistended, soft and nontender. No organomegaly or masses felt. Normal bowel sounds heard. Central nervous system: Alert and oriented. No focal neurological deficits. Extremities: Symmetric 5 x 5 power. Skin: No rashes, lesions or ulcers Psychiatry: Judgement and insight appear normal. Mood & affect appropriate.     Data Reviewed: I have personally reviewed following labs and imaging  studies  CBC: Recent Labs  Lab 12/03/20 1043 12/03/20 2141 12/04/20 0133  WBC 6.6 17.1* 11.0*  NEUTROABS 3.5 14.4*  --   HGB 13.2 11.3* 10.1*  HCT 43.0 37.3 32.7*  MCV 87.0 89.9 89.1  PLT 289 290 240   Basic Metabolic Panel: Recent Labs  Lab 12/03/20 2141 12/04/20 0133  NA 138 141  K 3.5 4.0  CL 109 108  CO2 22 25  GLUCOSE 158* 119*  BUN 18 15  CREATININE 1.01* 0.94  CALCIUM 8.5* 8.3*   GFR: Estimated Creatinine Clearance: 53.5 mL/min (by C-G formula based on SCr of 0.94 mg/dL). Liver Function Tests: No results for input(s): AST, ALT, ALKPHOS, BILITOT, PROT, ALBUMIN in the last 168 hours. No results for input(s): LIPASE, AMYLASE in the last 168 hours. No results for input(s): AMMONIA in the last 168 hours. Coagulation Profile: No results for input(s): INR, PROTIME in the last 168 hours. Cardiac Enzymes: No results for input(s): CKTOTAL, CKMB, CKMBINDEX, TROPONINI in the last 168 hours. BNP (last 3 results) No results for input(s): PROBNP in the last 8760 hours. HbA1C: No results for input(s): HGBA1C in the last 72 hours. CBG: No results for input(s): GLUCAP in the last 168 hours. Lipid Profile: No results for input(s): CHOL, HDL, LDLCALC, TRIG, CHOLHDL, LDLDIRECT in the last 72 hours. Thyroid Function Tests: No results for input(s): TSH, T4TOTAL, FREET4, T3FREE, THYROIDAB in the last 72 hours. Anemia Panel: Recent Labs    12/03/20 1042  FERRITIN 87  TIBC 276  IRON 57  RETICCTPCT 2.0   Sepsis Labs: No results for input(s): PROCALCITON, LATICACIDVEN in the last 168 hours.  Recent Results (from the past 240 hour(s))  Resp Panel by RT-PCR (Flu A&B, Covid) Nasopharyngeal Swab     Status: None   Collection Time: 12/03/20 11:48 PM   Specimen: Nasopharyngeal Swab; Nasopharyngeal(NP) swabs in vial transport medium  Result Value Ref Range Status   SARS Coronavirus 2 by RT PCR NEGATIVE NEGATIVE Final    Comment: (NOTE) SARS-CoV-2 target nucleic acids are NOT  DETECTED.  The SARS-CoV-2 RNA is generally detectable in upper respiratory specimens during the acute phase of infection. The lowest concentration of SARS-CoV-2 viral copies this assay can detect is 138 copies/mL. A negative result does not preclude SARS-Cov-2 infection and should not be used as the sole basis for treatment or other patient management decisions. A negative result may occur with  improper specimen collection/handling, submission of specimen other than nasopharyngeal swab, presence of viral mutation(s) within the areas targeted by this assay, and inadequate number of viral copies(<138 copies/mL). A negative result must be combined with clinical observations, patient history, and epidemiological information. The expected result is Negative.  Fact Sheet for Patients:  EntrepreneurPulse.com.au  Fact Sheet for Healthcare Providers:  IncredibleEmployment.be  This test is no t yet approved or cleared by the [place] FDA and  has been authorized for detection and/or diagnosis of SARS-CoV-2 by FDA under an Emergency Use Authorization (EUA). This EUA will remain  in effect (meaning this test  can be used) for the duration of the COVID-19 declaration under Section 564(b)(1) of the Act, 21 U.S.C.section 360bbb-3(b)(1), unless the authorization is terminated  or revoked sooner.       Influenza A by PCR NEGATIVE NEGATIVE Final   Influenza B by PCR NEGATIVE NEGATIVE Final    Comment: (NOTE) The Xpert Xpress SARS-CoV-2/FLU/RSV plus assay is intended as an aid in the diagnosis of influenza from Nasopharyngeal swab specimens and should not be used as a sole basis for treatment. Nasal washings and aspirates are unacceptable for Xpert Xpress SARS-CoV-2/FLU/RSV testing.  Fact Sheet for Patients: EntrepreneurPulse.com.au  Fact Sheet for Healthcare Providers: IncredibleEmployment.be  This test is not yet  approved or cleared by the [place] FDA and has been authorized for detection and/or diagnosis of SARS-CoV-2 by FDA under an Emergency Use Authorization (EUA). This EUA will remain in effect (meaning this test can be used) for the duration of the COVID-19 declaration under Section 564(b)(1) of the Act, 21 U.S.C. section 360bbb-3(b)(1), unless the authorization is terminated or revoked.  Performed at [facility], [address] [place], [place] [address]          Radiology Studies: No results found.      Scheduled Meds: . acidophilus  1 capsule Oral Daily  . gabapentin  300 mg Oral BID  . levothyroxine  50 mcg Oral Q0600  . pantoprazole  40 mg Intravenous Q12H  . sodium bicarbonate  650 mg Oral Daily  . sulfaSALAzine  500 mg Oral BID   Continuous Infusions: . lactated ringers       LOS: 0 days    Time spent: 30 minutes    Barb Merino, MD [facility] Pager [phone]

## 2020-12-04 NOTE — ED Notes (Signed)
Attempted to call report to the floor x1. No answer.

## 2020-12-04 NOTE — Progress Notes (Signed)
Received pt from ED, AOx4, VSS, denies any pain, skin intact, oriented to room, bed controls, call light and plan of care.  Patient now resting on bed comfortably watching TV.  Will monitor.

## 2020-12-04 NOTE — Progress Notes (Signed)
Patient noted to have small red liquid stool mixed with urine while using BSC.  Patient able to stand up, march in place, move 2 steps forward and backward using front wheel walker.  Patient now back on bed. Will monitor and endorse to day shift RN appropriately.

## 2020-12-04 NOTE — Plan of Care (Signed)
  Problem: Education: Goal: Knowledge of General Education information will improve Description: Including pain rating scale, medication(s)/side effects and non-pharmacologic comfort measures Outcome: Progressing   Problem: Clinical Measurements: Goal: Will remain free from infection Outcome: Progressing   

## 2020-12-04 NOTE — Consult Note (Signed)
Referring Provider: No ref. provider found Primary Care Physician:  [name], MD  Primary Gastroenterologist: Luciana Axe MD  Reason for Consultation:  GI bleed   IMPRESSION:  Painless hematochezia Acute on chronic normocytic anemia with acute GI blood loss anemia Pancolonic IBD - either Crohn's or ulcerative colitis Pancolonic diverticulosis seen on colonoscopy and CT Rectal ulcer seen on last colonoscopy 11/2019   Clinical presentation is most consistent with diverticular bleeding with frequency and volume of painless hematochezia decreasing. But, difficult to delineate from flare of IBD or bleeding from known rectal ulcer. She would prefer to avoid colonoscopy if at all possible  As she is feeling so much better than at the time of admission, will continue close observation. Plan colonoscopy tomorrow if bleeding volume or frequency increases over the course of day. Discussed with her bedside nurse at the time of my consultation.    PLAN: - Clear liquid diet today - Serial hgb/hct with transfusion as indicated - Monitor symptoms - Consider colon prep tonight for colonoscopy tomorrow if she is not continuing to improve   HPI: Rhonda Bailey is a 75 y.o. female seen in consultation at the request of the hospitalist team for further evaluation of GI bleed. The history is obtained through the patient, her daughter who is at the bedside, and review of her electronic health record.  She has hypertension, anxiety, hypothyroidism, sleep apnea, and iron deficiency anemi  She is followed by Dr. [name] for IBD - pancolonic Crohn's colitis versus panulcerative colitis treated with mesalamine and sulfasalazine.  Weyman Rodney has been discussed but not started. Last colonoscopy 12/04/19 showed pancolonic colitis, pancolonic diverticulosis, solitary rectal ulcer, and hemorrhoids. TI biopsies were normal. Her colitis symptoms is predominant diarrhea.  Sulfasalazine has caused some  constipation and resultant flare in hemorrhoids.  Seen in routine follow-up with Hematology/Oncology yesterday and had no symptoms at the time of that appointent.  While shopping at the farmer's market yesterday afternoon, she developed painless hematochezia. Had multiple large volume episodes over the course of the day. Volume and frequency have significant decreased since last night. No associated abdominal pain, tenesmus, or systemic symptoms. She does not feel these symptoms are similar to those that she experienced with prior colitis flares. No fevers, chills, night sweats, diarrhea, or extraGI manifestations of IBD.    Hemoglobin at [place] yesterday was 13.2. Hemoglobin on admission 11.3,  Labs this morning show normal BMP except glucose 119. WBC 11, hgb 10.1, platelets 252, MCV 89.1, RDW 17.3.    Last abdominal imaging 05/20/20: scattered diverticular disease, inflammatory change in the descending colon, subcutaneous stranding in the fat of the left abdomen    Past Medical History:  Diagnosis Date  . Anal fissure   . Anxiety   . Anxiety disorder, unspecified   . Essential (primary) hypertension   . Gastro-esophageal reflux disease with esophagitis   . GERD (gastroesophageal reflux disease)   . Hypokalemia   . Hypothyroidism   . Kidney stone   . Noninfective gastroenteritis and colitis, unspecified   . Sleep apnea     Past Surgical History:  Procedure Laterality Date  . BIOPSY  09/02/2018   Procedure: BIOPSY;  Surgeon: Daneil Dolin, MD;  Location: [facility];  Service: Endoscopy;;  colon   . CHOLECYSTECTOMY  05/19/2011   Procedure: LAPAROSCOPIC CHOLECYSTECTOMY;  Surgeon: Jamesetta So;  Location: [facility];  Service: General;  Laterality: N/A;  . COLONOSCOPY  11/2019   Dr. [name]:  rectal ulceration, TI  and IC valve appeared normal, 8 mm polyp in proximal ascending colon that was sessile. Inflammation in transverse colon to cecum, inflammation with altered  vascularity, edema, erosions, erythema, friability, shallow ulcerations in a continuous and circumferential pattern from rectum to splenix flexure, single twenty mm ulcer in distal rectum. Small and large-  . COLONOSCOPY WITH PROPOFOL N/A 09/02/2018   Dr. Gala Romney: Proctocolitis limited to the left side with some exudate versus partial pseudomembrane formation, erosions and ulceration somewhat superficial.  Terminal ileum normal.  Diverticulosis.  Descending/sigmoid colon biopsy showing severe active colitis, diagnostic features of IBD not identified.  Differential includes infection, ischemia.  Rectal biopsies with granulation tissue c/w ulcer  . CYSTOSCOPY W/ URETERAL STENT PLACEMENT Right 05/08/2019   Procedure: CYSTOSCOPY WITH RETROGRADE PYELOGRAM/URETERAL STENT PLACEMENT;  Surgeon: Festus Aloe, MD;  Location: [facility];  Service: Urology;  Laterality: Right;  . CYSTOSCOPY W/ URETERAL STENT PLACEMENT Left 05/17/2020   Procedure: CYSTOSCOPY WITH RETROGRADE PYELOGRAM/URETERAL STENT PLACEMENT;  Surgeon: Robley Fries, MD;  Location: [facility];  Service: Urology;  Laterality: Left;  . CYSTOSCOPY WITH RETROGRADE PYELOGRAM, URETEROSCOPY AND STENT PLACEMENT Left 06/23/2020   Procedure: CYSTOSCOPY WITH LEFT RETROGRADE PYELOGRAM, LEFT URETEROSCOPY AND STENT EXCHANGE;  Surgeon: Cleon Gustin, MD;  Location: [facility];  Service: Urology;  Laterality: Left;  . CYSTOSCOPY WITH URETEROSCOPY Right 06/11/2019   Procedure: CYSTOSCOPY WITH DIAGNOSTIC URETEROSCOPY; URETERAL STENT REMOVAL;  Surgeon: Cleon Gustin, MD;  Location: [facility];  Service: Urology;  Laterality: Right;  . HEMORRHOID SURGERY N/A 09/16/2019   Procedure: EXTENSIVE HEMORRHOIDECTOMY;  Surgeon: Aviva Signs, MD;  Location: [facility];  Service: General;  Laterality: N/A;  . RECTAL BIOPSY N/A 09/16/2019   Procedure: BIOPSY RECTAL;  Surgeon: Aviva Signs, MD;  Location: [facility];  Service: General;  Laterality: N/A;  . STONE EXTRACTION WITH BASKET   06/23/2020   Procedure: LEFT URETERAL STONE EXTRACTION WITH BASKET;  Surgeon: Cleon Gustin, MD;  Location: [facility];  Service: Urology;;  . TUBAL LIGATION      Current Facility-Administered Medications  Medication Dose Route Frequency Provider Last Rate Last Admin  . acetaminophen (TYLENOL) tablet 650 mg  650 mg Oral Q6H PRN Lenore Cordia, MD       Or  . acetaminophen (TYLENOL) suppository 650 mg  650 mg Rectal Q6H PRN Zada Finders R, MD      . acidophilus (RISAQUAD) capsule 1 capsule  1 capsule Oral Daily Barb Merino, MD   1 capsule at 12/04/20 1008  . ALPRAZolam Duanne Moron) tablet 0.5 mg  0.5 mg Oral QHS PRN Barb Merino, MD      . gabapentin (NEURONTIN) capsule 300 mg  300 mg Oral BID Barb Merino, MD   300 mg at 12/04/20 1008  . levothyroxine (SYNTHROID) tablet 50 mcg  50 mcg Oral Q0600 Lenore Cordia, MD   50 mcg at 12/04/20 0559  . ondansetron (ZOFRAN) tablet 4 mg  4 mg Oral Q6H PRN Lenore Cordia, MD       Or  . ondansetron (ZOFRAN) injection 4 mg  4 mg Intravenous Q6H PRN Zada Finders R, MD      . pantoprazole (PROTONIX) injection 40 mg  40 mg Intravenous Q12H Zada Finders R, MD   40 mg at 12/04/20 1008  . sodium bicarbonate tablet 650 mg  650 mg Oral Daily Barb Merino, MD   650 mg at 12/04/20 1008  . sulfaSALAzine (AZULFIDINE) tablet 500 mg  500 mg Oral BID Barb Merino, MD  Allergies as of 12/03/2020 - Review Complete 12/03/2020  Allergen Reaction Noted  . Tramadol Other (See Comments) 05/08/2019    Family History  Problem Relation Age of Onset  . Cancer Father        mouth  . Heart disease Sister   . Heart disease Brother   . Cancer Brother        head  . Cancer Daughter        ? type  . Anesthesia problems Neg Hx   . Hypotension Neg Hx   . Malignant hyperthermia Neg Hx   . Pseudochol deficiency Neg Hx   . Colon cancer Neg Hx   . Esophageal cancer Neg Hx   . Inflammatory bowel disease Neg Hx   . Liver disease Neg Hx   . Pancreatic  cancer Neg Hx   . Rectal cancer Neg Hx   . Stomach cancer Neg Hx      Physical Exam: General:   Alert,  well-nourished, pleasant and cooperative in NAD. Pale. Appears her stated age.  Head:  Normocephalic and atraumatic. Eyes:  Sclera clear, no icterus.   Conjunctiva pink. Ears:  Normal auditory acuity. Nose:  No deformity, discharge,  or lesions. Mouth:  No deformity or lesions.   Neck:  Supple; no masses or thyromegaly. Lungs:  Clear throughout to auscultation.   No wheezes. Heart:  Regular rate and rhythm; no murmurs. Abdomen:  Soft, nontender, nondistended, normal bowel sounds, no rebound or guarding. No hepatosplenomegaly.   Rectal:  Deferred until time of colonoscopy Msk:  Symmetrical. No boney deformities LAD: No inguinal or umbilical LAD Extremities:  No clubbing or edema. Neurologic:  Alert and  oriented x4;  grossly nonfocal Skin:  Intact without significant lesions or rashes. Psych:  Alert and cooperative. Normal mood and affect.   Lab Results: Recent Labs    12/03/20 1043 12/03/20 2141 12/04/20 0133  WBC 6.6 17.1* 11.0*  HGB 13.2 11.3* 10.1*  HCT 43.0 37.3 32.7*  PLT 289 290 252   BMET Recent Labs    12/03/20 2141 12/04/20 0133  NA 138 141  K 3.5 4.0  CL 109 108  CO2 22 25  GLUCOSE 158* 119*  BUN 18 15  CREATININE 1.01* 0.94  CALCIUM 8.5* 8.3*    Keyanah Kozicki L. Tarri Glenn, MD, MPH 12/04/2020, 12:37 PM

## 2020-12-05 DIAGNOSIS — K5731 Diverticulosis of large intestine without perforation or abscess with bleeding: Secondary | ICD-10-CM

## 2020-12-05 DIAGNOSIS — K921 Melena: Secondary | ICD-10-CM | POA: Diagnosis not present

## 2020-12-05 DIAGNOSIS — D62 Acute posthemorrhagic anemia: Secondary | ICD-10-CM

## 2020-12-05 DIAGNOSIS — K922 Gastrointestinal hemorrhage, unspecified: Secondary | ICD-10-CM | POA: Diagnosis not present

## 2020-12-05 LAB — HEMOGLOBIN AND HEMATOCRIT, BLOOD
HCT: 29.7 % — ABNORMAL LOW (ref 36.0–46.0)
Hemoglobin: 9.1 g/dL — ABNORMAL LOW (ref 12.0–15.0)

## 2020-12-05 MED ORDER — LACTATED RINGERS IV SOLN: INTRAVENOUS | Status: AC

## 2020-12-05 NOTE — Care Management Obs Status (Signed)
[facility] NOTIFICATION   Patient Details  Name: Rhonda Bailey MRN: [id number] Date of Birth: September 01, 1945   Medicare Observation Status Notification Given:  Yes    Carles Collet, RN 12/05/2020, 8:42 AM

## 2020-12-05 NOTE — Progress Notes (Addendum)
    Progress Note   Subjective  Chief Complaint: Diverticular bleed  Today, patient is found sitting in bed she had some clear liquids for breakfast and tolerated this well.  Apparently had a bowel movement this morning which only had a small amount of bright red blood.  Feels well.  Asking if she can eat regular food as her friend brought in some [place].   Objective   Vital signs in last 24 hours: Temp:  [97.9 F (36.6 C)-98.7 F (37.1 C)] 98.7 F (37.1 C) (04/30 2030) Pulse Rate:  [79-86] 86 (04/30 2030) Resp:  [18-19] 18 (04/30 2030) BP: (120-141)/(41-53) 141/53 (04/30 2030) SpO2:  [96 %] 96 % (04/30 2030) Last BM Date: 12/04/20 General:    Elderly white female in NAD Heart:  Regular rate and rhythm; no murmurs Lungs: Respirations even and unlabored, lungs CTA bilaterally Abdomen:  Soft, nontender and nondistended. Normal bowel sounds. Psych:  Cooperative. Normal mood and affect.  Intake/Output from previous day: 04/30 0701 - 05/01 0700 In: 1984.1 [P.O.:360; I.V.:1624.1] Out: -  Lab Results: Recent Labs    12/03/20 1043 12/03/20 1043 12/03/20 2141 12/04/20 0133 12/04/20 1650 12/05/20 0631  WBC 6.6  --  17.1* 11.0*  --   --   HGB 13.2   < > 11.3* 10.1* 8.9* 9.1*  HCT 43.0  --  37.3 32.7* 29.1* 29.7*  PLT 289  --  290 252  --   --    < > = values in this interval not displayed.   BMET Recent Labs    12/03/20 2141 12/04/20 0133  NA 138 141  K 3.5 4.0  CL 109 108  CO2 22 25  GLUCOSE 158* 119*  BUN 18 15  CREATININE 1.01* 0.94  CALCIUM 8.5* 8.3*     Assessment / Plan:   Assessment: 1.  Painless hematochezia: Suspected diverticular bleed 2.  Acute on chronic normocytic anemia: Hemoglobin 8.9--> 9.1 overnight 3.  Pancolonic IBD-either Crohn's or ulcerative colitis 4.  Rectal ulcer seen on last colonoscopy 4/21  Plan: 1.  Patient has done well overnight, hemoglobin stable and only a scant amount of bright red blood with bowel movement this morning.   Hopefully this means her diverticular bleed is resolving. 2.  We will advance diet to regular and see how she does. 3.  Please await any further recommendations from Dr. Henrene Pastor, but if no further bleeding patient can likely be discharged  Thank you for kind consultation.   LOS: 0 days   Levin Erp  12/05/2020, 9:51 AM  GI ATTENDING  Interval history data reviewed.  Agree with interval progress note.  No further bleeding.  Hemoglobin stable.  The clinical suspicion is diverticular bleed.  Okay to advance diet.  Continue to monitor stool and blood counts.  If no further bleeding today or overnight, the patient may be discharged home tomorrow.  GI will follow.  Docia Chuck. Geri Seminole., M.D. [facility] Division of Gastroenterology

## 2020-12-05 NOTE — Progress Notes (Signed)
PROGRESS NOTE    Rhonda Bailey  RDE:[id number] DOB: August 11, 1945 DOA: 12/03/2020 PCP: [name], MD    Brief Narrative:  75 year old female with history of IBD, rectal ulcer, and since anemia due to chronic blood loss, hemorrhoids, hypertension, hypothyroidism, GERD and anxiety presented to the ER with rectal bleeding.  Patient also goes to hematology office for iron transfusions.  She had occasional rectal bleeding but on the day of admission had large volume painless rectal bleeding.  She became anxious and took 1 dose of Xanax, became lightheaded and passed out.  She woke up and called EMS and came to ER. In the emergency room hemoglobin 11.3.  WBC 17.1.  Admitted due to significant symptoms.   Assessment & Plan:   Principal Problem:   Acute GI bleeding Active Problems:   Hypothyroidism   Anxiety disorder, unspecified   Essential (primary) hypertension   IBD (inflammatory bowel disease)   Iron deficiency anemia due to chronic blood loss  Bright red blood per rectum/painless rectal bleeding: Suspected diverticular bleed or bleeding from her previous rectal ulcer.  Currently clinical evidence of slowing down bleeding. Hemoglobin baseline is 13 -presented with 11 -10 -8.9 -9.   Continue IV fluids.  Continue Protonix. Followed by gastroenterology, they are recommending conservative management as this is already improving.  Advancing diet today, monitor for stool and bleeding.  Inflammatory bowel syndrome: Patient currently on sulfasalazine that is continued.  Hypertension: Blood pressure is stable.  Holding antihypertensives now.  Hypothyroidism: On Synthroid.  Continued.  Anxiety disorder: Uses Xanax at night.  She can use it.    DVT prophylaxis: SCDs Start: 12/04/20 0110   Code Status: Full code Family Communication: Daughter at the bedside. Disposition Plan: Status is: Observation  The patient will require care spanning > 2 midnights and should be moved to inpatient  because: Inpatient level of care appropriate due to severity of illness  Dispo: The patient is from: Home              Anticipated d/c is to: Home              Patient currently is not medically stable to d/c.   Difficult to place patient No         Consultants:   Gastroenterology  Procedures:   None  Antimicrobials:   None   Subjective: Patient seen and examined.  Daughter was at the bedside.  Denies any complaints.  She had 2 bowel movements since yesterday morning and both with streaks of blood but no frank bleeding.  Denies any abdominal pain or cramping.  Denies any nausea or vomiting.  Objective: Vitals:   12/04/20 0351 12/04/20 0949 12/04/20 1303 12/04/20 2030  BP: 123/61 (!) 117/55 (!) 120/41 (!) 141/53  Pulse: 79 78 79 86  Resp: 16 18 19 18   Temp: 97.8 F (36.6 C) 98.5 F (36.9 C) 97.9 F (36.6 C) 98.7 F (37.1 C)  TempSrc: Oral Oral Oral Oral  SpO2: 96% 97% 96% 96%  Weight:      Height:        Intake/Output Summary (Last 24 hours) at 12/05/2020 1319 Last data filed at 12/05/2020 0830 Gross per 24 hour  Intake 2284.06 ml  Output --  Net 2284.06 ml   Filed Weights   12/03/20 2111  Weight: 74.8 kg    Examination:  General exam: Appears calm and comfortable, sitting at the edge of the bed. Respiratory system: Clear to auscultation. Respiratory effort normal. Cardiovascular system: S1 &  S2 heard, RRR. No JVD, murmurs, rubs, gallops or clicks. No pedal edema. Gastrointestinal system: Abdomen is nondistended, soft and nontender. No organomegaly or masses felt. Normal bowel sounds heard. Central nervous system: Alert and oriented. No focal neurological deficits. Extremities: Symmetric 5 x 5 power. Skin: No rashes, lesions or ulcers Psychiatry: Judgement and insight appear normal. Mood & affect appropriate.     Data Reviewed: I have personally reviewed following labs and imaging studies  CBC: Recent Labs  Lab 12/03/20 1043 12/03/20 2141  12/04/20 0133 12/04/20 1650 12/05/20 0631  WBC 6.6 17.1* 11.0*  --   --   NEUTROABS 3.5 14.4*  --   --   --   HGB 13.2 11.3* 10.1* 8.9* 9.1*  HCT 43.0 37.3 32.7* 29.1* 29.7*  MCV 87.0 89.9 89.1  --   --   PLT 289 290 252  --   --    Basic Metabolic Panel: Recent Labs  Lab 12/03/20 2141 12/04/20 0133  NA 138 141  K 3.5 4.0  CL 109 108  CO2 22 25  GLUCOSE 158* 119*  BUN 18 15  CREATININE 1.01* 0.94  CALCIUM 8.5* 8.3*   GFR: Estimated Creatinine Clearance: 53.5 mL/min (by C-G formula based on SCr of 0.94 mg/dL). Liver Function Tests: No results for input(s): AST, ALT, ALKPHOS, BILITOT, PROT, ALBUMIN in the last 168 hours. No results for input(s): LIPASE, AMYLASE in the last 168 hours. No results for input(s): AMMONIA in the last 168 hours. Coagulation Profile: No results for input(s): INR, PROTIME in the last 168 hours. Cardiac Enzymes: No results for input(s): CKTOTAL, CKMB, CKMBINDEX, TROPONINI in the last 168 hours. BNP (last 3 results) No results for input(s): PROBNP in the last 8760 hours. HbA1C: No results for input(s): HGBA1C in the last 72 hours. CBG: No results for input(s): GLUCAP in the last 168 hours. Lipid Profile: No results for input(s): CHOL, HDL, LDLCALC, TRIG, CHOLHDL, LDLDIRECT in the last 72 hours. Thyroid Function Tests: No results for input(s): TSH, T4TOTAL, FREET4, T3FREE, THYROIDAB in the last 72 hours. Anemia Panel: Recent Labs    12/03/20 1042  FERRITIN 87  TIBC 276  IRON 57  RETICCTPCT 2.0   Sepsis Labs: No results for input(s): PROCALCITON, LATICACIDVEN in the last 168 hours.  Recent Results (from the past 240 hour(s))  Resp Panel by RT-PCR (Flu A&B, Covid) Nasopharyngeal Swab     Status: None   Collection Time: 12/03/20 11:48 PM   Specimen: Nasopharyngeal Swab; Nasopharyngeal(NP) swabs in vial transport medium  Result Value Ref Range Status   SARS Coronavirus 2 by RT PCR NEGATIVE NEGATIVE Final    Comment: (NOTE) SARS-CoV-2  target nucleic acids are NOT DETECTED.  The SARS-CoV-2 RNA is generally detectable in upper respiratory specimens during the acute phase of infection. The lowest concentration of SARS-CoV-2 viral copies this assay can detect is 138 copies/mL. A negative result does not preclude SARS-Cov-2 infection and should not be used as the sole basis for treatment or other patient management decisions. A negative result may occur with  improper specimen collection/handling, submission of specimen other than nasopharyngeal swab, presence of viral mutation(s) within the areas targeted by this assay, and inadequate number of viral copies(<138 copies/mL). A negative result must be combined with clinical observations, patient history, and epidemiological information. The expected result is Negative.  Fact Sheet for Patients:  EntrepreneurPulse.com.au  Fact Sheet for Healthcare Providers:  IncredibleEmployment.be  This test is no t yet approved or cleared by the [place] FDA  and  has been authorized for detection and/or diagnosis of SARS-CoV-2 by FDA under an Emergency Use Authorization (EUA). This EUA will remain  in effect (meaning this test can be used) for the duration of the COVID-19 declaration under Section 564(b)(1) of the Act, 21 U.S.C.section 360bbb-3(b)(1), unless the authorization is terminated  or revoked sooner.       Influenza A by PCR NEGATIVE NEGATIVE Final   Influenza B by PCR NEGATIVE NEGATIVE Final    Comment: (NOTE) The Xpert Xpress SARS-CoV-2/FLU/RSV plus assay is intended as an aid in the diagnosis of influenza from Nasopharyngeal swab specimens and should not be used as a sole basis for treatment. Nasal washings and aspirates are unacceptable for Xpert Xpress SARS-CoV-2/FLU/RSV testing.  Fact Sheet for Patients: EntrepreneurPulse.com.au  Fact Sheet for Healthcare  Providers: IncredibleEmployment.be  This test is not yet approved or cleared by the [place] FDA and has been authorized for detection and/or diagnosis of SARS-CoV-2 by FDA under an Emergency Use Authorization (EUA). This EUA will remain in effect (meaning this test can be used) for the duration of the COVID-19 declaration under Section 564(b)(1) of the Act, 21 U.S.C. section 360bbb-3(b)(1), unless the authorization is terminated or revoked.  Performed at [facility], [place] [address], [place], [place] [address]          Radiology Studies: No results found.      Scheduled Meds: . acidophilus  1 capsule Oral Daily  . gabapentin  300 mg Oral BID  . levothyroxine  50 mcg Oral Q0600  . pantoprazole  40 mg Intravenous Q12H  . sodium bicarbonate  650 mg Oral Daily  . sulfaSALAzine  500 mg Oral BID   Continuous Infusions: . lactated ringers 100 mL/hr at 12/05/20 1155     LOS: 0 days    Time spent: 30 minutes    Barb Merino, MD [facility] Pager [phone]

## 2020-12-06 ENCOUNTER — Telehealth: Payer: Self-pay

## 2020-12-06 DIAGNOSIS — K922 Gastrointestinal hemorrhage, unspecified: Secondary | ICD-10-CM

## 2020-12-06 DIAGNOSIS — D500 Iron deficiency anemia secondary to blood loss (chronic): Secondary | ICD-10-CM

## 2020-12-06 DIAGNOSIS — K921 Melena: Secondary | ICD-10-CM | POA: Diagnosis not present

## 2020-12-06 DIAGNOSIS — D62 Acute posthemorrhagic anemia: Secondary | ICD-10-CM | POA: Diagnosis not present

## 2020-12-06 DIAGNOSIS — K5731 Diverticulosis of large intestine without perforation or abscess with bleeding: Secondary | ICD-10-CM | POA: Diagnosis not present

## 2020-12-06 LAB — CBC WITH DIFFERENTIAL/PLATELET
Abs Immature Granulocytes: 0.03 10*3/uL (ref 0.00–0.07)
Basophils Absolute: 0 10*3/uL (ref 0.0–0.1)
Basophils Relative: 1 %
Eosinophils Absolute: 0.3 10*3/uL (ref 0.0–0.5)
Eosinophils Relative: 4 %
HCT: 28.8 % — ABNORMAL LOW (ref 36.0–46.0)
Hemoglobin: 9.1 g/dL — ABNORMAL LOW (ref 12.0–15.0)
Immature Granulocytes: 0 %
Lymphocytes Relative: 32 %
Lymphs Abs: 2.2 10*3/uL (ref 0.7–4.0)
MCH: 27.7 pg (ref 26.0–34.0)
MCHC: 31.6 g/dL (ref 30.0–36.0)
MCV: 87.8 fL (ref 80.0–100.0)
Monocytes Absolute: 0.7 10*3/uL (ref 0.1–1.0)
Monocytes Relative: 10 %
Neutro Abs: 3.6 10*3/uL (ref 1.7–7.7)
Neutrophils Relative %: 53 %
Platelets: 234 10*3/uL (ref 150–400)
RBC: 3.28 MIL/uL — ABNORMAL LOW (ref 3.87–5.11)
RDW: 17.2 % — ABNORMAL HIGH (ref 11.5–15.5)
WBC: 6.8 10*3/uL (ref 4.0–10.5)
nRBC: 0 % (ref 0.0–0.2)

## 2020-12-06 NOTE — Plan of Care (Signed)
  Problem: Education: Goal: Knowledge of General Education information will improve Description: Including pain rating scale, medication(s)/side effects and non-pharmacologic comfort measures 12/06/2020 1053 by Elon Jester, RN Outcome: Completed/Met 12/06/2020 0827 by Elon Jester, RN Outcome: Progressing   Problem: Health Behavior/Discharge Planning: Goal: Ability to manage health-related needs will improve 12/06/2020 1053 by Elon Jester, RN Outcome: Completed/Met 12/06/2020 0827 by Elon Jester, RN Outcome: Progressing   Problem: Clinical Measurements: Goal: Ability to maintain clinical measurements within normal limits will improve 12/06/2020 1053 by Elon Jester, RN Outcome: Completed/Met 12/06/2020 0827 by Elon Jester, RN Outcome: Progressing Goal: Will remain free from infection 12/06/2020 1053 by Elon Jester, RN Outcome: Completed/Met 12/06/2020 0827 by Elon Jester, RN Outcome: Progressing Goal: Diagnostic test results will improve 12/06/2020 1053 by Elon Jester, RN Outcome: Completed/Met 12/06/2020 0827 by Elon Jester, RN Outcome: Progressing Goal: Respiratory complications will improve 12/06/2020 1053 by Elon Jester, RN Outcome: Completed/Met 12/06/2020 0827 by Elon Jester, RN Outcome: Progressing Goal: Cardiovascular complication will be avoided 12/06/2020 1053 by Elon Jester, RN Outcome: Completed/Met 12/06/2020 0827 by Elon Jester, RN Outcome: Progressing   Problem: Activity: Goal: Risk for activity intolerance will decrease 12/06/2020 1053 by Elon Jester, RN Outcome: Completed/Met 12/06/2020 0827 by Elon Jester, RN Outcome: Progressing   Problem: Nutrition: Goal: Adequate nutrition will be maintained 12/06/2020 1053 by Elon Jester, RN Outcome: Completed/Met 12/06/2020 0827 by Elon Jester, RN Outcome: Progressing   Problem: Coping: Goal: Level of anxiety will decrease 12/06/2020 1053 by Elon Jester, RN Outcome: Completed/Met 12/06/2020 0827 by Elon Jester, RN Outcome: Progressing   Problem: Elimination: Goal: Will not experience complications related to bowel motility 12/06/2020 1053 by Elon Jester, RN Outcome: Completed/Met 12/06/2020 0827 by Elon Jester, RN Outcome: Progressing Goal: Will not experience complications related to urinary retention 12/06/2020 1053 by Elon Jester, RN Outcome: Completed/Met 12/06/2020 0827 by Elon Jester, RN Outcome: Progressing   Problem: Pain Managment: Goal: General experience of comfort will improve 12/06/2020 1053 by Elon Jester, RN Outcome: Completed/Met 12/06/2020 0827 by Elon Jester, RN Outcome: Progressing   Problem: Safety: Goal: Ability to remain free from injury will improve 12/06/2020 1053 by Elon Jester, RN Outcome: Completed/Met 12/06/2020 0827 by Elon Jester, RN Outcome: Progressing   Problem: Skin Integrity: Goal: Risk for impaired skin integrity will decrease 12/06/2020 1053 by Elon Jester, RN Outcome: Completed/Met 12/06/2020 0827 by Elon Jester, RN Outcome: Progressing

## 2020-12-06 NOTE — Progress Notes (Addendum)
Attending physician's note   I have taken reviewed the chart and discussed her care on rounds. I agree with the Advanced Practitioner's note, impression, and recommendations as outlined.   Rubi Tooley, DO, FACG [phone] office                                                                     Daily Rounding Note  12/06/2020, 11:30 AM  LOS: 0 days   SUBJECTIVE:   Chief complaint:      Large brown stool yesterday, smaller brown stool this morning.  Feels well.  Tolerating solid food.  No dizziness, lightheadedness.  No abdominal pain.  OBJECTIVE:         Vital signs in last 24 hours:    Temp:  [97.9 F (36.6 C)-98.6 F (37 C)] 97.9 F (36.6 C) (05/02 0501) Pulse Rate:  [78-81] 79 (05/02 0501) Resp:  [17-18] 17 (05/02 0501) BP: (110-122)/(46-58) 110/46 (05/02 0501) SpO2:  [94 %-99 %] 94 % (05/02 0501) Last BM Date: 12/04/20 Filed Weights   12/03/20 2111  Weight: 74.8 kg   General: Does not look ill. Heart: RRR. Chest: No labored breathing or cough Abdomen: Soft without tenderness.  Active bowel sounds.  Not distended. Extremities: No CCE. Neuro/Psych: Pleasant, cooperative, fluid speech.  No gross deficits.  Intake/Output from previous day: 05/01 0701 - 05/02 0700 In: 1681.5 [P.O.:650; I.V.:1031.5] Out: -   Intake/Output this shift: No intake/output data recorded.  Lab Results: Recent Labs    12/03/20 2141 12/04/20 0133 12/04/20 1650 12/05/20 0631 12/06/20 0612  WBC 17.1* 11.0*  --   --  6.8  HGB 11.3* 10.1* 8.9* 9.1* 9.1*  HCT 37.3 32.7* 29.1* 29.7* 28.8*  PLT 290 252  --   --  234   BMET Recent Labs    12/03/20 2141 12/04/20 0133  NA 138 141  K 3.5 4.0  CL 109 108  CO2 22 25  GLUCOSE 158* 119*  BUN 18 15  CREATININE 1.01* 0.94  CALCIUM 8.5* 8.3*   LFT No results for input(s): PROT, ALBUMIN, AST, ALT, ALKPHOS, BILITOT, BILIDIR, IBILI in the last 72 hours. PT/INR No results for input(s): LABPROT, INR in the last 72  hours. Hepatitis Panel No results for input(s): HEPBSAG, HCVAB, HEPAIGM, HEPBIGM in the last 72 hours.  Studies/Results: No results found.  ASSESMENT:   *    Painless hematochezia.  Suspect acute diverticular bleed Pan colonic IBD, either Crohn's or UC.  . Latest colonoscopy 12/04/2019 with pan colitis, pandiverticulosis, solitary rectal ulcer, hemorrhoids.  Colon biopsies confirmed chronic active colitis.  Normal TI biopsies. 09/2019 hemorrhoidectomy. Home meds consist of Azulfidine 500 mg bid.  Entyvio discussed but not started.  Her insurance would not cover Lialda.  Currently her colitis symptoms are well controlled. Last CT imaging was 05/2020.    *    acute on chronic [place] anemia.  Hx iron deficiency.  Followed by heme.  Previous Venofer infusions 07/2020.  Hgb 11.3 >> 9.1.  No transfusions.   PLAN   *   Going home today.  Pt will continue to be in phone contact with Dr. Donneta Romberg office as she has been doing.  *    continue the Azulfidine.  Resume previous solid diet.  Azucena Freed  12/06/2020, 11:30 AM Phone [phone]

## 2020-12-06 NOTE — Telephone Encounter (Signed)
S/w pt and she is aware of her f/u appt per 12/03/20 los   [employer]

## 2020-12-06 NOTE — Discharge Summary (Signed)
Physician Discharge Summary  Rhonda Bailey ZOX:[id number] DOB: February 06, 1946 DOA: 12/03/2020  PCP: [name], MD  Admit date: 12/03/2020 Discharge date: 12/06/2020  Admitted From: Home Disposition: Home  Recommendations for Outpatient Follow-up:  1. Follow up with PCP in 1-2 weeks 2. Please obtain CBC in 1 week 3. Report to GI office for any excessive bleeding or continues ongoing bleed.  Home Health: Not applicable Equipment/Devices: Not applicable  Discharge Condition: Stable CODE STATUS: Full code Diet recommendation: Low-salt diet, laxatives and stool softeners.  Avoid constipation.  Discharge summary: 75 year old female with history of IBD, rectal ulcer, and chronic iron deficiency anemia due to chronic blood loss, hemorrhoids, hypertension, hypothyroidism, GERD and anxiety presented to the ER with rectal bleeding.  Patient also goes to hematology office for iron transfusions.  She had occasional rectal bleeding but on the day of admission had large volume painless rectal bleeding.  She became anxious and took 1 dose of Xanax, became lightheaded and passed out.  She woke up and called EMS and came to ER. In the emergency room hemoglobin 11.3.  WBC 17.1.    Admitted to hospital due to significant symptoms.  Seen and followed by gastroenterology.  Bright red blood per rectum/painless rectal bleeding: Suspected diverticular bleed or bleeding from her previous rectal ulcer.  Currently clinical evidence of slowing down bleeding. Hemoglobin baseline is 13 -presented with 11 -10 -8.9 -9-9.  Remained stable.   Followed by gastroenterology, they are recommending conservative management as this is already improving.  Now with normal bowel function and traces of bleeding that is anticipated from likely diverticular bleed.  Patient going home today.  She will monitor her symptoms and excess bleeding and report to GI office if needed. Requested repeat hemoglobin check at PCP office in 1  week.  Inflammatory bowel syndrome: Patient currently on sulfasalazine that is continued.  No evidence of exacerbation.  Hypertension: Blood pressure is stable.  Holding antihypertensives now.  Hypothyroidism: On Synthroid.  Continued.  Anxiety disorder: Uses Xanax at night.  She can use it.  I advised patient not to use Xanax while she is having active symptoms like bleeding as it may mask real problems and make her sleepy.  Patient is a stabilized enough to go home as per GI recommendation with outpatient follow-up.     Discharge Diagnoses:  Principal Problem:   Acute GI bleeding Active Problems:   Hypothyroidism   Anxiety disorder, unspecified   Essential (primary) hypertension   IBD (inflammatory bowel disease)   Iron deficiency anemia due to chronic blood loss    Discharge Instructions  Discharge Instructions    Call MD for:   Complete by: As directed    Please report back to the emergency room for any dizziness lightheadedness, excessive bleeding. Traces of blood is expected.   Call MD for:  extreme fatigue   Complete by: As directed    Call MD for:  persistant dizziness or light-headedness   Complete by: As directed    Diet - low sodium heart healthy   Complete by: As directed    Discharge instructions   Complete by: As directed    Take over-the-counter laxatives and stool softener to ensure 1-2 soft bowel movement a day.   Increase activity slowly   Complete by: As directed      Allergies as of 12/06/2020      Reactions   Tramadol Other (See Comments)   Says "blew my head up."      Medication List  STOP taking these medications   budesonide 3 MG 24 hr capsule Commonly known as: ENTOCORT EC     TAKE these medications   acidophilus Caps capsule Take 1 capsule by mouth daily.   ALPRAZolam 0.5 MG tablet Commonly known as: XANAX Take 0.5 mg by mouth at bedtime as needed for sleep.   amLODipine 2.5 MG tablet Commonly known as: NORVASC Take  1 tablet (2.5 mg total) by mouth daily as needed (high BP). What changed: when to take this   gabapentin 300 MG capsule Commonly known as: NEURONTIN Take 300 mg by mouth in the morning and at bedtime.   levothyroxine 50 MCG tablet Commonly known as: SYNTHROID Take 50 mcg by mouth every morning.   multivitamin with minerals Tabs tablet Take 1 tablet by mouth daily.   pantoprazole 40 MG tablet Commonly known as: PROTONIX Take 1 tablet (40 mg total) by mouth 2 (two) times daily before a meal. What changed: when to take this   sodium bicarbonate 650 MG tablet Take 1 tablet (650 mg total) by mouth daily.   sulfaSALAzine 500 MG tablet Commonly known as: AZULFIDINE TAKE 1 TABLET BY MOUTH TWICE A DAY What changed:   how much to take  how to take this  when to take this  additional instructions       Follow-up Information    Koirala, Dibas, MD Follow up in 1 week(s).   Specialty: Family Medicine Why: Please recheck her CBC Contact information: [address] [place] [address] [phone]              Allergies  Allergen Reactions  . Tramadol Other (See Comments)    Says "blew my head up."    Consultations:  Gastroenterology   Procedures/Studies:  No results found. (Echo, Carotid, EGD, Colonoscopy, ERCP)    Subjective: Patient seen and examined.  No overnight events.  She had 3 bowel movement since yesterday morning.  Eating regular diet.  Denies any abdominal pain.  Last bowel movement today morning with trace of blood mixed with stool.   Discharge Exam: Vitals:   12/05/20 2138 12/06/20 0501  BP: (!) 122/58 (!) 110/46  Pulse: 78 79  Resp: 17 17  Temp: 98 F (36.7 C) 97.9 F (36.6 C)  SpO2: 94% 94%   Vitals:   12/04/20 2030 12/05/20 1433 12/05/20 2138 12/06/20 0501  BP: (!) 141/53 (!) 122/47 (!) 122/58 (!) 110/46  Pulse: 86 81 78 79  Resp: 18 18 17 17   Temp: 98.7 F (37.1 C) 98.6 F (37 C) 98 F (36.7 C) 97.9 F  (36.6 C)  TempSrc: Oral Oral Oral   SpO2: 96% 99% 94% 94%  Weight:      Height:        General: Pt is alert, awake, not in acute distress Cardiovascular: RRR, S1/S2 +, no rubs, no gallops Respiratory: CTA bilaterally, no wheezing, no rhonchi Abdominal: Soft, NT, ND, bowel sounds + Extremities: no edema, no cyanosis    The results of significant diagnostics from this hospitalization (including imaging, microbiology, ancillary and laboratory) are listed below for reference.     Microbiology: Recent Results (from the past 240 hour(s))  Resp Panel by RT-PCR (Flu A&B, Covid) Nasopharyngeal Swab     Status: None   Collection Time: 12/03/20 11:48 PM   Specimen: Nasopharyngeal Swab; Nasopharyngeal(NP) swabs in vial transport medium  Result Value Ref Range Status   SARS Coronavirus 2 by RT PCR NEGATIVE NEGATIVE Final  Comment: (NOTE) SARS-CoV-2 target nucleic acids are NOT DETECTED.  The SARS-CoV-2 RNA is generally detectable in upper respiratory specimens during the acute phase of infection. The lowest concentration of SARS-CoV-2 viral copies this assay can detect is 138 copies/mL. A negative result does not preclude SARS-Cov-2 infection and should not be used as the sole basis for treatment or other patient management decisions. A negative result may occur with  improper specimen collection/handling, submission of specimen other than nasopharyngeal swab, presence of viral mutation(s) within the areas targeted by this assay, and inadequate number of viral copies(<138 copies/mL). A negative result must be combined with clinical observations, patient history, and epidemiological information. The expected result is Negative.  Fact Sheet for Patients:  EntrepreneurPulse.com.au  Fact Sheet for Healthcare Providers:  IncredibleEmployment.be  This test is no t yet approved or cleared by the [place] FDA and  has been authorized for detection  and/or diagnosis of SARS-CoV-2 by FDA under an Emergency Use Authorization (EUA). This EUA will remain  in effect (meaning this test can be used) for the duration of the COVID-19 declaration under Section 564(b)(1) of the Act, 21 U.S.C.section 360bbb-3(b)(1), unless the authorization is terminated  or revoked sooner.       Influenza A by PCR NEGATIVE NEGATIVE Final   Influenza B by PCR NEGATIVE NEGATIVE Final    Comment: (NOTE) The Xpert Xpress SARS-CoV-2/FLU/RSV plus assay is intended as an aid in the diagnosis of influenza from Nasopharyngeal swab specimens and should not be used as a sole basis for treatment. Nasal washings and aspirates are unacceptable for Xpert Xpress SARS-CoV-2/FLU/RSV testing.  Fact Sheet for Patients: EntrepreneurPulse.com.au  Fact Sheet for Healthcare Providers: IncredibleEmployment.be  This test is not yet approved or cleared by the [place] FDA and has been authorized for detection and/or diagnosis of SARS-CoV-2 by FDA under an Emergency Use Authorization (EUA). This EUA will remain in effect (meaning this test can be used) for the duration of the COVID-19 declaration under Section 564(b)(1) of the Act, 21 U.S.C. section 360bbb-3(b)(1), unless the authorization is terminated or revoked.  Performed at [facility], [place] [address], [place], [place] [address]      Labs: BNP (last 3 results) Recent Labs    05/20/20 0834  BNP 174.0*   Basic Metabolic Panel: Recent Labs  Lab 12/03/20 2141 12/04/20 0133  NA 138 141  K 3.5 4.0  CL 109 108  CO2 22 25  GLUCOSE 158* 119*  BUN 18 15  CREATININE 1.01* 0.94  CALCIUM 8.5* 8.3*   Liver Function Tests: No results for input(s): AST, ALT, ALKPHOS, BILITOT, PROT, ALBUMIN in the last 168 hours. No results for input(s): LIPASE, AMYLASE in the last 168 hours. No results for input(s): AMMONIA in the last 168 hours. CBC: Recent Labs  Lab 12/03/20 1043  12/03/20 2141 12/04/20 0133 12/04/20 1650 12/05/20 0631 12/06/20 0612  WBC 6.6 17.1* 11.0*  --   --  6.8  NEUTROABS 3.5 14.4*  --   --   --  3.6  HGB 13.2 11.3* 10.1* 8.9* 9.1* 9.1*  HCT 43.0 37.3 32.7* 29.1* 29.7* 28.8*  MCV 87.0 89.9 89.1  --   --  87.8  PLT 289 290 252  --   --  234   Cardiac Enzymes: No results for input(s): CKTOTAL, CKMB, CKMBINDEX, TROPONINI in the last 168 hours. BNP: Invalid input(s): POCBNP CBG: No results for input(s): GLUCAP in the last 168 hours. D-Dimer No results for input(s): DDIMER in the  last 72 hours. Hgb A1c No results for input(s): HGBA1C in the last 72 hours. Lipid Profile No results for input(s): CHOL, HDL, LDLCALC, TRIG, CHOLHDL, LDLDIRECT in the last 72 hours. Thyroid function studies No results for input(s): TSH, T4TOTAL, T3FREE, THYROIDAB in the last 72 hours.  Invalid input(s): FREET3 Anemia work up No results for input(s): VITAMINB12, FOLATE, FERRITIN, TIBC, IRON, RETICCTPCT in the last 72 hours. Urinalysis    Component Value Date/Time   COLORURINE AMBER (A) 05/18/2020 0418   APPEARANCEUR Clear 06/30/2020 1344   LABSPEC 1.010 05/18/2020 0418   PHURINE 5.0 05/18/2020 0418   GLUCOSEU Negative 06/30/2020 1344   GLUCOSEU NEGATIVE 04/23/2020 1102   HGBUR LARGE (A) 05/18/2020 0418   BILIRUBINUR Negative 06/30/2020 1344   KETONESUR 5 (A) 05/18/2020 0418   PROTEINUR 1+ (A) 06/30/2020 1344   PROTEINUR 30 (A) 05/18/2020 0418   UROBILINOGEN 0.2 04/23/2020 1102   NITRITE Negative 06/30/2020 1344   NITRITE NEGATIVE 05/18/2020 0418   LEUKOCYTESUR Trace (A) 06/30/2020 1344   LEUKOCYTESUR LARGE (A) 05/18/2020 0418   Sepsis Labs Invalid input(s): PROCALCITONIN,  WBC,  LACTICIDVEN Microbiology Recent Results (from the past 240 hour(s))  Resp Panel by RT-PCR (Flu A&B, Covid) Nasopharyngeal Swab     Status: None   Collection Time: 12/03/20 11:48 PM   Specimen: Nasopharyngeal Swab; Nasopharyngeal(NP) swabs in vial transport medium   Result Value Ref Range Status   SARS Coronavirus 2 by RT PCR NEGATIVE NEGATIVE Final    Comment: (NOTE) SARS-CoV-2 target nucleic acids are NOT DETECTED.  The SARS-CoV-2 RNA is generally detectable in upper respiratory specimens during the acute phase of infection. The lowest concentration of SARS-CoV-2 viral copies this assay can detect is 138 copies/mL. A negative result does not preclude SARS-Cov-2 infection and should not be used as the sole basis for treatment or other patient management decisions. A negative result may occur with  improper specimen collection/handling, submission of specimen other than nasopharyngeal swab, presence of viral mutation(s) within the areas targeted by this assay, and inadequate number of viral copies(<138 copies/mL). A negative result must be combined with clinical observations, patient history, and epidemiological information. The expected result is Negative.  Fact Sheet for Patients:  EntrepreneurPulse.com.au  Fact Sheet for Healthcare Providers:  IncredibleEmployment.be  This test is no t yet approved or cleared by the [place] FDA and  has been authorized for detection and/or diagnosis of SARS-CoV-2 by FDA under an Emergency Use Authorization (EUA). This EUA will remain  in effect (meaning this test can be used) for the duration of the COVID-19 declaration under Section 564(b)(1) of the Act, 21 U.S.C.section 360bbb-3(b)(1), unless the authorization is terminated  or revoked sooner.       Influenza A by PCR NEGATIVE NEGATIVE Final   Influenza B by PCR NEGATIVE NEGATIVE Final    Comment: (NOTE) The Xpert Xpress SARS-CoV-2/FLU/RSV plus assay is intended as an aid in the diagnosis of influenza from Nasopharyngeal swab specimens and should not be used as a sole basis for treatment. Nasal washings and aspirates are unacceptable for Xpert Xpress SARS-CoV-2/FLU/RSV testing.  Fact Sheet for  Patients: EntrepreneurPulse.com.au  Fact Sheet for Healthcare Providers: IncredibleEmployment.be  This test is not yet approved or cleared by the [place] FDA and has been authorized for detection and/or diagnosis of SARS-CoV-2 by FDA under an Emergency Use Authorization (EUA). This EUA will remain in effect (meaning this test can be used) for the duration of the COVID-19 declaration under Section 564(b)(1) of the Act, 21  U.S.C. section 360bbb-3(b)(1), unless the authorization is terminated or revoked.  Performed at [facility], [place] [address], [place], [place] [address]      Time coordinating discharge:  32 minutes  SIGNED:   Barb Merino, MD  [facility] 12/06/2020, 10:51 AM

## 2020-12-06 NOTE — Plan of Care (Signed)

## 2020-12-06 NOTE — Plan of Care (Signed)
Pt stable to go home.  All needs met.

## 2020-12-14 DIAGNOSIS — K50918 Crohn's disease, unspecified, with other complication: Secondary | ICD-10-CM | POA: Diagnosis not present

## 2020-12-14 DIAGNOSIS — G4700 Insomnia, unspecified: Secondary | ICD-10-CM | POA: Diagnosis not present

## 2020-12-14 DIAGNOSIS — Z79899 Other long term (current) drug therapy: Secondary | ICD-10-CM | POA: Diagnosis not present

## 2020-12-14 DIAGNOSIS — K529 Noninfective gastroenteritis and colitis, unspecified: Secondary | ICD-10-CM | POA: Diagnosis not present

## 2020-12-14 DIAGNOSIS — K625 Hemorrhage of anus and rectum: Secondary | ICD-10-CM | POA: Diagnosis not present

## 2021-01-18 DIAGNOSIS — M5416 Radiculopathy, lumbar region: Secondary | ICD-10-CM | POA: Diagnosis not present

## 2021-01-19 ENCOUNTER — Other Ambulatory Visit: Payer: Self-pay | Admitting: Gastroenterology

## 2021-02-03 DIAGNOSIS — M5416 Radiculopathy, lumbar region: Secondary | ICD-10-CM | POA: Diagnosis not present

## 2021-03-08 ENCOUNTER — Telehealth: Payer: Self-pay | Admitting: *Deleted

## 2021-03-08 NOTE — Telephone Encounter (Signed)
Called and lvm with patient - called and spoke to daughter about rescheduling appointment with Judson Roch - Confirmed - mailed new updated calendar

## 2021-03-09 ENCOUNTER — Other Ambulatory Visit: Payer: Self-pay | Admitting: Gastroenterology

## 2021-04-01 ENCOUNTER — Inpatient Hospital Stay: Payer: Medicare HMO | Admitting: Family

## 2021-04-01 ENCOUNTER — Inpatient Hospital Stay: Payer: Medicare HMO | Attending: Family

## 2021-04-01 ENCOUNTER — Other Ambulatory Visit: Payer: Self-pay | Admitting: Gastroenterology

## 2021-04-01 ENCOUNTER — Encounter: Payer: Self-pay | Admitting: Family

## 2021-04-01 ENCOUNTER — Other Ambulatory Visit: Payer: Self-pay

## 2021-04-01 VITALS — BP 135/62 | HR 90 | Temp 97.8°F | Resp 19 | Ht 65.0 in | Wt 176.0 lb

## 2021-04-01 DIAGNOSIS — D500 Iron deficiency anemia secondary to blood loss (chronic): Secondary | ICD-10-CM | POA: Insufficient documentation

## 2021-04-01 DIAGNOSIS — K589 Irritable bowel syndrome without diarrhea: Secondary | ICD-10-CM | POA: Insufficient documentation

## 2021-04-01 DIAGNOSIS — Z79899 Other long term (current) drug therapy: Secondary | ICD-10-CM | POA: Diagnosis not present

## 2021-04-01 LAB — CBC WITH DIFFERENTIAL (CANCER CENTER ONLY)
Abs Immature Granulocytes: 0.07 10*3/uL (ref 0.00–0.07)
Basophils Absolute: 0.1 10*3/uL (ref 0.0–0.1)
Basophils Relative: 1 %
Eosinophils Absolute: 0.3 10*3/uL (ref 0.0–0.5)
Eosinophils Relative: 2 %
HCT: 42.3 % (ref 36.0–46.0)
Hemoglobin: 12.8 g/dL (ref 12.0–15.0)
Immature Granulocytes: 1 %
Lymphocytes Relative: 17 %
Lymphs Abs: 2.6 10*3/uL (ref 0.7–4.0)
MCH: 24 pg — ABNORMAL LOW (ref 26.0–34.0)
MCHC: 30.3 g/dL (ref 30.0–36.0)
MCV: 79.4 fL — ABNORMAL LOW (ref 80.0–100.0)
Monocytes Absolute: 1.2 10*3/uL — ABNORMAL HIGH (ref 0.1–1.0)
Monocytes Relative: 8 %
Neutro Abs: 10.8 10*3/uL — ABNORMAL HIGH (ref 1.7–7.7)
Neutrophils Relative %: 71 %
Platelet Count: 344 10*3/uL (ref 150–400)
RBC: 5.33 MIL/uL — ABNORMAL HIGH (ref 3.87–5.11)
RDW: 18.4 % — ABNORMAL HIGH (ref 11.5–15.5)
WBC Count: 15.1 10*3/uL — ABNORMAL HIGH (ref 4.0–10.5)
nRBC: 0 % (ref 0.0–0.2)

## 2021-04-01 LAB — RETICULOCYTES
Immature Retic Fract: 29 % — ABNORMAL HIGH (ref 2.3–15.9)
RBC.: 5.27 MIL/uL — ABNORMAL HIGH (ref 3.87–5.11)
Retic Count, Absolute: 76.9 10*3/uL (ref 19.0–186.0)
Retic Ct Pct: 1.5 % (ref 0.4–3.1)

## 2021-04-01 LAB — FERRITIN: Ferritin: 21 ng/mL (ref 11–307)

## 2021-04-01 LAB — IRON AND TIBC
Iron: 34 ug/dL — ABNORMAL LOW (ref 41–142)
Saturation Ratios: 10 % — ABNORMAL LOW (ref 21–57)
TIBC: 344 ug/dL (ref 236–444)
UIBC: 309 ug/dL (ref 120–384)

## 2021-04-01 NOTE — Progress Notes (Signed)
Hematology and Oncology Follow Up Visit  Rhonda Bailey [id number] July 25, 1946 75 y.o. 04/01/2021   Principle Diagnosis:  Iron deficiency anemia due to chronic blood loss with IBS (Crohn's vs. UC)   Current Therapy:  IV iron as indicated    Interim History:  Rhonda Bailey is here today for follow-up. She is doing well at this time. After leaving her last visit with [place] she had lunch and later that day had a flare with bloody stool and spent several days in the hospital.  She has had no other blood loss since that time. No bruising or petechiae.  Her Hgb remains stable at 12.8, MCV 79, platelets 344 and WBC count 15.1.  She has some mild fatigue at this time.  She states that she though she was getting a head cold but did a nebulizer treatment and is feeling better.  No fever, chills, n/v, cough, rash, dizziness, SOB, chest pain, palpitations, abdominal pain or changes in bowel or bladder habits.  No swelling, tenderness, numbness or tingling in her extremities.  No falls or syncope.  She has maintained a good appetite and is staying well hydrated. She is being careful to avoid foods that may trigger her IBS. Her weight is stable at 176 lbs.   ECOG Performance Status: 1 - Symptomatic but completely ambulatory  Medications:  Allergies as of 04/01/2021       Reactions   Tramadol Other (See Comments)   Says "blew my head up."        Medication List        Accurate as of April 01, 2021 10:49 AM. If you have any questions, ask your nurse or doctor.          acidophilus Caps capsule Take 1 capsule by mouth daily.   ALPRAZolam 0.5 MG tablet Commonly known as: XANAX Take 0.5 mg by mouth at bedtime as needed for sleep.   amLODipine 2.5 MG tablet Commonly known as: NORVASC Take 1 tablet (2.5 mg total) by mouth daily as needed (high BP). What changed: when to take this   gabapentin 300 MG capsule Commonly known as: NEURONTIN Take 300 mg by mouth in the morning and at bedtime.    levothyroxine 50 MCG tablet Commonly known as: SYNTHROID Take 50 mcg by mouth every morning.   multivitamin with minerals Tabs tablet Take 1 tablet by mouth daily.   pantoprazole 40 MG tablet Commonly known as: PROTONIX Take 1 tablet (40 mg total) by mouth 2 (two) times daily before a meal. What changed: when to take this   sodium bicarbonate 650 MG tablet Take 1 tablet (650 mg total) by mouth daily.   sulfaSALAzine 500 MG tablet Commonly known as: AZULFIDINE TAKE 1 TABLET BY MOUTH TWICE A DAY        Allergies:  Allergies  Allergen Reactions   Tramadol Other (See Comments)    Says "blew my head up."    Past Medical History, Surgical history, Social history, and Family History were reviewed and updated.  Review of Systems: All other 10 point review of systems is negative.   Physical Exam:  height is 5' 5"  (1.651 m) and weight is 176 lb (79.8 kg). Her oral temperature is 97.8 F (36.6 C). Her blood pressure is 135/62 and her pulse is 90. Her respiration is 19 and oxygen saturation is 98%.   Wt Readings from Last 3 Encounters:  04/01/21 176 lb (79.8 kg)  12/03/20 165 lb (74.8 kg)  12/03/20 165 lb (74.8  kg)    Ocular: Sclerae unicteric, pupils equal, round and reactive to light Ear-nose-throat: Oropharynx clear, dentition fair Lymphatic: No cervical or supraclavicular adenopathy Lungs no rales or rhonchi, good excursion bilaterally Heart regular rate and rhythm, no murmur appreciated Abd soft, nontender, positive bowel sounds MSK no focal spinal tenderness, no joint edema Neuro: non-focal, well-oriented, appropriate affect Breasts: Deferred   Lab Results  Component Value Date   WBC 15.1 (H) 04/01/2021   HGB 12.8 04/01/2021   HCT 42.3 04/01/2021   MCV 79.4 (L) 04/01/2021   PLT 344 04/01/2021   Lab Results  Component Value Date   FERRITIN 87 12/03/2020   IRON 57 12/03/2020   TIBC 276 12/03/2020   UIBC 219 12/03/2020   IRONPCTSAT 21 12/03/2020   Lab  Results  Component Value Date   RETICCTPCT 1.5 04/01/2021   RBC 5.27 (H) 04/01/2021   RETICCTABS 62,860 12/09/2019   No results found for: Nils Pyle, [facility] Lab Results  Component Value Date   IGGSERUM 870 12/09/2019   No results found for: Odetta Pink, SPEI   Chemistry      Component Value Date/Time   NA 141 12/04/2020 0133   NA 142 03/19/2019 0000   K 4.0 12/04/2020 0133   CL 108 12/04/2020 0133   CO2 25 12/04/2020 0133   BUN 15 12/04/2020 0133   BUN 16 03/19/2019 0000   CREATININE 0.94 12/04/2020 0133   CREATININE 1.14 (H) 08/30/2020 0813   GLU 101 03/19/2019 0000      Component Value Date/Time   CALCIUM 8.3 (L) 12/04/2020 0133   ALKPHOS 89 08/30/2020 0813   AST 17 08/30/2020 0813   ALT 11 08/30/2020 0813   BILITOT 0.4 08/30/2020 0813       Impression and Plan: Rhonda Bailey is a very pleasant 75 yo caucasian female with iron deficiency secondary to chronic blood loss with IBS (Crohn's vs. UC).  Iron studies are pending. We will replace if needed.  Follow-up in 4 months.  She can contact our office with any questions or concerns.   [name], NP 8/26/202210:49 AM

## 2021-04-04 ENCOUNTER — Telehealth: Payer: Self-pay | Admitting: *Deleted

## 2021-04-04 ENCOUNTER — Telehealth: Payer: Self-pay

## 2021-04-04 NOTE — Telephone Encounter (Signed)
Per 04/01/21 LOS - Called and gave upcoming appointments - confirmed - mailed calendar

## 2021-04-06 ENCOUNTER — Ambulatory Visit: Payer: Medicare HMO | Admitting: Family

## 2021-04-06 ENCOUNTER — Other Ambulatory Visit: Payer: Medicare HMO

## 2021-04-18 ENCOUNTER — Other Ambulatory Visit: Payer: Self-pay

## 2021-04-18 ENCOUNTER — Inpatient Hospital Stay: Payer: Medicare HMO | Attending: Family

## 2021-04-18 ENCOUNTER — Ambulatory Visit: Payer: Medicare HMO

## 2021-04-18 VITALS — BP 148/79 | HR 79 | Temp 97.8°F | Resp 17

## 2021-04-18 DIAGNOSIS — K589 Irritable bowel syndrome without diarrhea: Secondary | ICD-10-CM | POA: Diagnosis not present

## 2021-04-18 DIAGNOSIS — D500 Iron deficiency anemia secondary to blood loss (chronic): Secondary | ICD-10-CM | POA: Diagnosis not present

## 2021-04-18 MED ORDER — SODIUM CHLORIDE 0.9 % IV SOLN
200.0000 mg | Freq: Once | INTRAVENOUS | Status: AC
Start: 2021-04-18 — End: 2021-04-18
  Administered 2021-04-18: 200 mg via INTRAVENOUS
  Filled 2021-04-18: qty 200

## 2021-04-18 MED ORDER — SODIUM CHLORIDE 0.9 % IV SOLN
Freq: Once | INTRAVENOUS | Status: AC
Start: 2021-04-18 — End: 2021-04-18

## 2021-04-18 NOTE — Patient Instructions (Signed)

## 2021-04-24 ENCOUNTER — Other Ambulatory Visit: Payer: Self-pay | Admitting: Gastroenterology

## 2021-04-25 ENCOUNTER — Ambulatory Visit: Payer: Medicare HMO

## 2021-04-25 ENCOUNTER — Inpatient Hospital Stay: Payer: Medicare HMO

## 2021-04-25 ENCOUNTER — Other Ambulatory Visit: Payer: Self-pay

## 2021-04-25 VITALS — BP 131/59 | HR 73 | Temp 98.0°F | Resp 16

## 2021-04-25 DIAGNOSIS — K589 Irritable bowel syndrome without diarrhea: Secondary | ICD-10-CM | POA: Diagnosis not present

## 2021-04-25 DIAGNOSIS — D500 Iron deficiency anemia secondary to blood loss (chronic): Secondary | ICD-10-CM

## 2021-04-25 MED ORDER — SODIUM CHLORIDE 0.9 % IV SOLN
200.0000 mg | Freq: Once | INTRAVENOUS | Status: AC
  Administered 2021-04-25: 200 mg via INTRAVENOUS
  Filled 2021-04-25: qty 200

## 2021-04-25 MED ORDER — SODIUM CHLORIDE 0.9 % IV SOLN: Freq: Once | INTRAVENOUS | Status: AC

## 2021-04-25 NOTE — Progress Notes (Signed)
Pt. Refused to wait 30 minutes post infusion. Released stable and ASX.

## 2021-04-25 NOTE — Patient Instructions (Signed)

## 2021-04-27 ENCOUNTER — Telehealth: Payer: Self-pay | Admitting: Gastroenterology

## 2021-04-27 NOTE — Telephone Encounter (Signed)
The pt has been having constipation episodes.  No bleeding or abd pain.  She has been scheduled to see Dr [name] on 10/20 since she was last seen 1 year ago.  She will take miralax daily for now.  She will call back if her symptoms worsen or change.

## 2021-05-02 ENCOUNTER — Inpatient Hospital Stay: Payer: Medicare HMO

## 2021-05-02 ENCOUNTER — Other Ambulatory Visit: Payer: Self-pay

## 2021-05-02 ENCOUNTER — Ambulatory Visit: Payer: Medicare HMO

## 2021-05-02 VITALS — BP 148/77 | HR 77 | Temp 97.9°F | Resp 16

## 2021-05-02 DIAGNOSIS — K589 Irritable bowel syndrome without diarrhea: Secondary | ICD-10-CM | POA: Diagnosis not present

## 2021-05-02 DIAGNOSIS — D500 Iron deficiency anemia secondary to blood loss (chronic): Secondary | ICD-10-CM

## 2021-05-02 MED ORDER — SODIUM CHLORIDE 0.9 % IV SOLN
200.0000 mg | Freq: Once | INTRAVENOUS | Status: AC
  Administered 2021-05-02: 200 mg via INTRAVENOUS
  Filled 2021-05-02: qty 200

## 2021-05-02 MED ORDER — SODIUM CHLORIDE 0.9 % IV SOLN: Freq: Once | INTRAVENOUS | Status: AC

## 2021-05-02 NOTE — Patient Instructions (Signed)

## 2021-05-09 ENCOUNTER — Inpatient Hospital Stay: Payer: Medicare HMO | Attending: Family

## 2021-05-09 ENCOUNTER — Other Ambulatory Visit: Payer: Self-pay

## 2021-05-09 ENCOUNTER — Ambulatory Visit: Payer: Medicare HMO

## 2021-05-09 VITALS — BP 141/47 | HR 83 | Temp 98.0°F | Resp 17

## 2021-05-09 DIAGNOSIS — K589 Irritable bowel syndrome without diarrhea: Secondary | ICD-10-CM | POA: Insufficient documentation

## 2021-05-09 DIAGNOSIS — D500 Iron deficiency anemia secondary to blood loss (chronic): Secondary | ICD-10-CM | POA: Diagnosis not present

## 2021-05-09 MED ORDER — SODIUM CHLORIDE 0.9 % IV SOLN
200.0000 mg | Freq: Once | INTRAVENOUS | Status: AC
  Administered 2021-05-09: 200 mg via INTRAVENOUS
  Filled 2021-05-09: qty 200

## 2021-05-09 MED ORDER — SODIUM CHLORIDE 0.9 % IV SOLN: Freq: Once | INTRAVENOUS | Status: AC

## 2021-05-09 NOTE — Patient Instructions (Signed)

## 2021-05-26 ENCOUNTER — Ambulatory Visit: Payer: Medicare HMO | Admitting: Gastroenterology

## 2021-05-26 ENCOUNTER — Other Ambulatory Visit (INDEPENDENT_AMBULATORY_CARE_PROVIDER_SITE_OTHER): Payer: Medicare HMO

## 2021-05-26 ENCOUNTER — Encounter: Payer: Self-pay | Admitting: Gastroenterology

## 2021-05-26 VITALS — BP 118/72 | HR 82 | Ht 65.0 in | Wt 176.0 lb

## 2021-05-26 DIAGNOSIS — Z8719 Personal history of other diseases of the digestive system: Secondary | ICD-10-CM

## 2021-05-26 DIAGNOSIS — D509 Iron deficiency anemia, unspecified: Secondary | ICD-10-CM

## 2021-05-26 DIAGNOSIS — K529 Noninfective gastroenteritis and colitis, unspecified: Secondary | ICD-10-CM | POA: Diagnosis not present

## 2021-05-26 DIAGNOSIS — K649 Unspecified hemorrhoids: Secondary | ICD-10-CM

## 2021-05-26 LAB — IBC + FERRITIN
Ferritin: 151.7 ng/mL (ref 10.0–291.0)
Iron: 34 ug/dL — ABNORMAL LOW (ref 42–145)
Saturation Ratios: 11.7 % — ABNORMAL LOW (ref 20.0–50.0)
TIBC: 291.2 ug/dL (ref 250.0–450.0)
Transferrin: 208 mg/dL — ABNORMAL LOW (ref 212.0–360.0)

## 2021-05-26 LAB — CBC
HCT: 44.9 % (ref 36.0–46.0)
Hemoglobin: 14.2 g/dL (ref 12.0–15.0)
MCHC: 31.6 g/dL (ref 30.0–36.0)
MCV: 82.4 fl (ref 78.0–100.0)
Platelets: 313 10*3/uL (ref 150.0–400.0)
RBC: 5.45 Mil/uL — ABNORMAL HIGH (ref 3.87–5.11)
RDW: 22.2 % — ABNORMAL HIGH (ref 11.5–15.5)
WBC: 8.2 10*3/uL (ref 4.0–10.5)

## 2021-05-26 LAB — COMPREHENSIVE METABOLIC PANEL
ALT: 8 U/L (ref 0–35)
AST: 13 U/L (ref 0–37)
Albumin: 4.4 g/dL (ref 3.5–5.2)
Alkaline Phosphatase: 86 U/L (ref 39–117)
BUN: 16 mg/dL (ref 6–23)
CO2: 29 mEq/L (ref 19–32)
Calcium: 9.4 mg/dL (ref 8.4–10.5)
Chloride: 105 mEq/L (ref 96–112)
Creatinine, Ser: 1.11 mg/dL (ref 0.40–1.20)
GFR: 48.58 mL/min — ABNORMAL LOW (ref >60.00)
Glucose, Bld: 95 mg/dL (ref 70–99)
Potassium: 4.5 mEq/L (ref 3.5–5.1)
Sodium: 141 mEq/L (ref 135–145)
Total Bilirubin: 0.4 mg/dL (ref 0.2–1.2)
Total Protein: 7.1 g/dL (ref 6.0–8.3)

## 2021-05-26 LAB — C-REACTIVE PROTEIN: CRP: 1 mg/dL (ref 0.5–20.0)

## 2021-05-26 LAB — SEDIMENTATION RATE: Sed Rate: 9 mm/hr (ref 0–30)

## 2021-05-26 MED ORDER — HYDROCORTISONE ACETATE 25 MG RE SUPP: 25.0000 mg | Freq: Every evening | RECTAL | 0 refills | Status: DC

## 2021-05-26 MED ORDER — SULFASALAZINE 500 MG PO TABS: 500.0000 mg | ORAL_TABLET | Freq: Two times a day (BID) | ORAL | 3 refills | Status: DC

## 2021-05-26 NOTE — Patient Instructions (Addendum)
We have sent the following medications to your pharmacy for you to pick up at your convenience: Anusol Suppositories, Sulfasalazine   Check when you get home to see if you have a prescription bottle for Imuran. Please let us know.   Your provider has requested that you go to the basement level for lab work before leaving today. Press "B" on the elevator. The lab is located at the first door on the left as you exit the elevator.  It has been recommended to you by your physician that you have a(n) Colon/EGD in the [facility] completed. We did not schedule the procedure(s) today.This needs to be done in January 2023. Please contact our office in December  at [phone]  to have the procedure scheduled.. You will be scheduled for a pre-visit and procedure at that time.    If you are age 61 or older, your body mass index should be between 23-30. Your Body mass index is 29.29 kg/m. If this is out of the aforementioned range listed, please consider follow up with your Primary Care Provider.  ________________________________________________________  The [facility] providers would like to encourage you to use [facility] to communicate with providers for non-urgent requests or questions.  Due to long hold times on the telephone, sending your provider a message by [facility] may be a faster and more efficient way to get a response.  Please allow 48 business hours for a response.  Please remember that this is for non-urgent requests.  _______________________________________________________  Thank you for choosing me and [facility].  Dr. [name]

## 2021-05-26 NOTE — Progress Notes (Addendum)
[facility] VISIT   Primary Care Provider [place], [place], [place] [address] [place] [id number] [phone]  Patient Profile: Rhonda Bailey is a 75 y.o. female with a pmh significant for hypertension, anxiety, hypothyroidism, sleep apnea, iron deficiency anemia, nephrolithiasis, hemorrhoids (status post resection), GERD, diverticulosis (recent diverticular hemorrhage in 2022), IBD (most likely Crohn's colitis versus pan ulcerative colitis -recently on AZA/Humira but developed antibodies so transitioned to Lialda and then to sulfasalazine due to insurance-somehow came off azathioprine).  The patient presents to the [facility] for an evaluation and management of problem(s) noted below:  Problem List 1. IBD (inflammatory bowel disease)   2. Iron deficiency anemia, unspecified iron deficiency anemia type   3. Hemorrhoids, unspecified hemorrhoid type   4. History of GI diverticular bleed      History of Present Illness Please see prior notes for full details of HPI.  Interval History The patient returns for follow-up.  She was admitted to the hospital at the end of April and discharged in early May with what was initially concerning for potential flare of her IBD but turned out to likely be a diverticular hemorrhage.  She did not require endoscopic evaluation and she was able to be discharged.  The patient had to be transitioned earlier this year from Lialda to sulfasalazine as a result of insurance issues.  The patient had been on Humira and azathioprine but as a result of Humira development antibodies we had to stop that.  Somehow the patient states that she has not been taking azathioprine.  Thankfully, the patient seems to be doing very well just on sulfasalazine therapy.  Today her daughter describes this also to be the case.  She is not having new joint pains or discomforts.  She is not noting any blood in her stools.  She  is actually had more constipation episodes in the past but she is fine with that because it has given her a life.  She hopes to never go back to the way things were before.  Interestingly however, the patient has required multiple IV iron infusions due to iron deficiency and the etiology of this has not been completely clear.  IBD diarrhea as follows: BMs per day - 1-3 times daily (usually 2 in the morning and potentially 1 in the afternoon) and these are formed Nocturnal BMs -none Blood -none Mucous -noted on early morning bowel movement but not in the rest of the day Tenesmus -none Urgency -much less than before Skin Manifestations -no new issues Eye Manifestations -no new issues Joint Manifestations -no new issues  GI Review of Systems Positive as above Negative for dysphagia, odynophagia, pyrosis, nausea, vomiting, pain, melena, hematochezia   Review of Systems General: Denies fevers/chills/unintentional weight loss HEENT: Denies new oral lesions  Cardiovascular: Denies chest pain Pulmonary: Denies shortness of breath Gastroenterological: See HPI Genitourinary: Denies darkened urine Hematological: Positive for easy bruising/bleeding which is longstanding Dermatological: Denies jaundice Psychological: Mood is stable   Medications Current Outpatient Medications  Medication Sig Dispense Refill   acidophilus (RISAQUAD) CAPS capsule Take 1 capsule by mouth daily.     ALPRAZolam (XANAX) 0.5 MG tablet Take 0.5 mg by mouth at bedtime as needed for sleep.     amLODipine (NORVASC) 2.5 MG tablet Take 1 tablet (2.5 mg total) by mouth daily as needed (high BP). (Patient taking differently: Take 2.5 mg by mouth daily.)     bisacodyl 5 MG EC tablet Take 5 mg by  mouth daily as needed for moderate constipation.     cholecalciferol (VITAMIN D3) 25 MCG (1000 UNIT) tablet Take 2,000 Units by mouth daily.     Cyanocobalamin (VITAMIN B-12 PO) Take 2,000 mg by mouth.     gabapentin (NEURONTIN) 300  MG capsule Take 300 mg by mouth in the morning and at bedtime.     hydrocortisone (ANUSOL-HC) 25 MG suppository Place 1 suppository (25 mg total) rectally at bedtime. For 3-4 nights. 12 suppository 0   levothyroxine (SYNTHROID) 50 MCG tablet Take 50 mcg by mouth every morning.     Multiple Vitamin (MULTIVITAMIN WITH MINERALS) TABS tablet Take 1 tablet by mouth daily.     pantoprazole (PROTONIX) 40 MG tablet Take 1 tablet (40 mg total) by mouth 2 (two) times daily before a meal. (Patient taking differently: Take 40 mg by mouth daily.) 60 tablet 1   sodium bicarbonate 650 MG tablet Take 1 tablet (650 mg total) by mouth daily. 30 tablet 11   sulfaSALAzine (AZULFIDINE) 500 MG tablet Take 1 tablet (500 mg total) by mouth 2 (two) times daily. 60 tablet 3   No current facility-administered medications for this visit.    Allergies Allergies  Allergen Reactions   Tramadol Other (See Comments)    Says "blew my head up."    Histories Past Medical History:  Diagnosis Date   Anal fissure    Anxiety    Anxiety disorder, unspecified    Essential (primary) hypertension    Gastro-esophageal reflux disease with esophagitis    GERD (gastroesophageal reflux disease)    Hypokalemia    Hypothyroidism    Kidney stone    Noninfective gastroenteritis and colitis, unspecified    Pinched vertebral nerve    back   Sleep apnea    Past Surgical History:  Procedure Laterality Date   BIOPSY  09/02/2018   Procedure: BIOPSY;  Surgeon: Daneil Dolin, MD;  Location: [facility];  Service: Endoscopy;;  colon    CHOLECYSTECTOMY  05/19/2011   Procedure: LAPAROSCOPIC CHOLECYSTECTOMY;  Surgeon: Jamesetta So;  Location: [facility];  Service: General;  Laterality: N/A;   COLONOSCOPY  11/2019   Dr. [name]:  rectal ulceration, TI and IC valve appeared normal, 8 mm polyp in proximal ascending colon that was sessile. Inflammation in transverse colon to cecum, inflammation with altered vascularity, edema, erosions,  erythema, friability, shallow ulcerations in a continuous and circumferential pattern from rectum to splenix flexure, single twenty mm ulcer in distal rectum. Small and large-   COLONOSCOPY WITH PROPOFOL N/A 09/02/2018   Dr. Gala Romney: Proctocolitis limited to the left side with some exudate versus partial pseudomembrane formation, erosions and ulceration somewhat superficial.  Terminal ileum normal.  Diverticulosis.  Descending/sigmoid colon biopsy showing severe active colitis, diagnostic features of IBD not identified.  Differential includes infection, ischemia.  Rectal biopsies with granulation tissue c/w ulcer   CYSTOSCOPY W/ URETERAL STENT PLACEMENT Right 05/08/2019   Procedure: CYSTOSCOPY WITH RETROGRADE PYELOGRAM/URETERAL STENT PLACEMENT;  Surgeon: Festus Aloe, MD;  Location: [facility];  Service: Urology;  Laterality: Right;   CYSTOSCOPY W/ URETERAL STENT PLACEMENT Left 05/17/2020   Procedure: CYSTOSCOPY WITH RETROGRADE PYELOGRAM/URETERAL STENT PLACEMENT;  Surgeon: Robley Fries, MD;  Location: [facility];  Service: Urology;  Laterality: Left;   CYSTOSCOPY WITH RETROGRADE PYELOGRAM, URETEROSCOPY AND STENT PLACEMENT Left 06/23/2020   Procedure: CYSTOSCOPY WITH LEFT RETROGRADE PYELOGRAM, LEFT URETEROSCOPY AND STENT EXCHANGE;  Surgeon: Cleon Gustin, MD;  Location: [facility];  Service: Urology;  Laterality: Left;  CYSTOSCOPY WITH URETEROSCOPY Right 06/11/2019   Procedure: CYSTOSCOPY WITH DIAGNOSTIC URETEROSCOPY; URETERAL STENT REMOVAL;  Surgeon: Cleon Gustin, MD;  Location: [facility];  Service: Urology;  Laterality: Right;   HEMORRHOID SURGERY N/A 09/16/2019   Procedure: EXTENSIVE HEMORRHOIDECTOMY;  Surgeon: Aviva Signs, MD;  Location: [facility];  Service: General;  Laterality: N/A;   RECTAL BIOPSY N/A 09/16/2019   Procedure: BIOPSY RECTAL;  Surgeon: Aviva Signs, MD;  Location: [facility];  Service: General;  Laterality: N/A;   STONE EXTRACTION WITH BASKET  06/23/2020   Procedure: LEFT URETERAL  STONE EXTRACTION WITH BASKET;  Surgeon: Cleon Gustin, MD;  Location: [facility];  Service: Urology;;   TUBAL LIGATION     Social History   Socioeconomic History   Marital status: Divorced    Spouse name: Not on file   Number of children: 2   Years of education: Not on file   Highest education level: Not on file  Occupational History   Occupation: retired  Tobacco Use   Smoking status: Never   Smokeless tobacco: Never  Vaping Use   Vaping Use: Never used  Substance and Sexual Activity   Alcohol use: Not Currently    Comment: once a year   Drug use: No   Sexual activity: Not Currently  Other Topics Concern   Not on file  Social History Narrative   Not on file   Social Determinants of Health   Financial Resource Strain: Not on file  Food Insecurity: Not on file  Transportation Needs: Not on file  Physical Activity: Not on file  Stress: Not on file  Social Connections: Not on file  Intimate Partner Violence: Not on file   Family History  Problem Relation Age of Onset   Cancer Father        mouth   Heart disease Sister    Heart disease Brother    Cancer Brother        head   Cancer Daughter        ? type   Anesthesia problems Neg Hx    Hypotension Neg Hx    Malignant hyperthermia Neg Hx    Pseudochol deficiency Neg Hx    Colon cancer Neg Hx    Esophageal cancer Neg Hx    Inflammatory bowel disease Neg Hx    Liver disease Neg Hx    Pancreatic cancer Neg Hx    Rectal cancer Neg Hx    Stomach cancer Neg Hx    I have reviewed her medical, social, and family history in detail and updated the electronic medical record as necessary.    PHYSICAL EXAMINATION  BP 118/72   Pulse 82   Ht _0  (1.651 m)   Wt 176 lb (79.8 kg)   SpO2 94%   BMI 29.29 kg/m  Wt Readings from Last 3 Encounters:  05/26/21 176 lb (79.8 kg)  04/01/21 176 lb (79.8 kg)  12/03/20 165 lb (74.8 kg)  GEN: No acute distress, nontoxic appearing, accompanied by daughter PSYCH: Cooperative,  without pressured speech EYE: Conjunctivae pink, sclerae anicteric ENT: MMM, without oral ulcers CV: Nontachycardic RESP: No audible wheezing GI: NABS, soft, mildly protuberant, nontender, no rebound MSK/EXT: No significant lower extremity edema SKIN: No jaundice NEURO:  Alert & Oriented x 3, no focal deficits   REVIEW OF DATA  I reviewed the following data at the time of this encounter:  GI Procedures and Studies  Previously reviewed  Laboratory Studies  Reviewed those in epic  Imaging  Studies  October 2021 CT abdomen pelvis without contrast IMPRESSION: There is new stranding in subcutaneous fat of the left abdomen which may be due to dependent change but is nonspecific. Trace bilateral pleural effusions and dependent atelectasis have mildly increased since the prior CT. Resolved left hydronephrosis with a new double-J ureteral stone in good position. 3-4 punctate left renal stones are again seen. Some improvement of inflammatory change about the descending colon. Fatty infiltration of the liver. Aortic Atherosclerosis (ICD10-I70.0).   ASSESSMENT  Ms. Lynne is a 75 y.o. female with a pmh significant for hypertension, anxiety, hypothyroidism, sleep apnea, iron deficiency anemia, nephrolithiasis, hemorrhoids (status post resection), GERD, diverticulosis (recent diverticular hemorrhage in 2022), IBD (most likely Crohn's colitis versus pan ulcerative colitis -recently on AZA/Humira but developed antibodies so transitioned to Lialda and then to sulfasalazine due to insurance-somehow came off azathioprine).  The patient is seen today for evaluation and management of:  1. IBD (inflammatory bowel disease)   2. Iron deficiency anemia, unspecified iron deficiency anemia type   3. Hemorrhoids, unspecified hemorrhoid type   4. History of GI diverticular bleed    The patient is clinically and hemodynamically stable.  She has done exceedingly well just on sulfasalazine therapy.  With this  being said, why the azathioprine fell off I am not sure but she is going to ensure at home that she is not taking this because her PCP records suggest that she may be on it still.  The patient will need to undergo a colonoscopy to evaluate for mucosal healing.  Because even though she is clinically coming towards or in any remission I am worried as to why she has continued iron deficiency and anemia.  In the setting of this I would likely consider repeat endoscopic evaluation from both above and below.  If she does not have active disease and we do not find anything on an upper and lower endoscopy that she would require a video capsule endoscopy to ensure there are no other lesions that could be a source for iron deficiency.  If her disease activity is still present that could be a reason for iron deficiency and then we may need to consider other Biologics and reinitiating of azathioprine/6-MP.  Time will tell.  Patient will do some inflammatory biomarkers and also bring in a fecal calprotectin stool test to see where things stand.   We will plan for endoscopic evaluation in early 2023 or earlier if significant iron deficiency and anemia are present.  All patient questions were answered to the best of my ability, and the patient agrees to the aforementioned plan of action with follow-up as indicated.   PLAN  Laboratories as outlined below Fecal calprotectin to be obtained Continue sulfasalazine 1000 mg daily (may be able to increase up to 2000 mg daily if patient tolerates or as necessary) Will need to confirm that patient is no longer taking azathioprine Biologic therapy will need to be considered if patient has active disease still present Appreciate hematology evaluation and IV iron infusions as needed If hemorrhoidal bleeding recurs then Anusol suppositories can be used nightly for 4 nights Continue stool softeners daily Consider initiation of FiberCon daily   Orders Placed This Encounter   Procedures   Calprotectin, Fecal   CBC   Comp Met (CMET)   Sedimentation rate   C-reactive protein   IBC + Ferritin     New Prescriptions   HYDROCORTISONE (ANUSOL-HC) 25 MG SUPPOSITORY    Place 1 suppository (25 mg total)  rectally at bedtime. For 3-4 nights.   Modified Medications   Modified Medication Previous Medication   SULFASALAZINE (AZULFIDINE) 500 MG TABLET sulfaSALAzine (AZULFIDINE) 500 MG tablet      Take 1 tablet (500 mg total) by mouth 2 (two) times daily.    TAKE 1 TABLET BY MOUTH TWICE A DAY    Planned Follow Up No follow-ups on file.   Total Time in Face-to-Face and in Coordination of Care for patient including independent/personal interpretation/review of prior testing, medical history, examination, medication adjustment, communicating results with the patient directly, and documentation with the EHR is more than 30 minutes.   Justice Britain, MD [facility] Office # [phone]

## 2021-05-27 DIAGNOSIS — Z8719 Personal history of other diseases of the digestive system: Secondary | ICD-10-CM | POA: Insufficient documentation

## 2021-06-01 ENCOUNTER — Other Ambulatory Visit: Payer: Medicare HMO

## 2021-06-08 LAB — CALPROTECTIN, FECAL: Calprotectin, Fecal: 1419 ug/g — ABNORMAL HIGH (ref 0–120)

## 2021-07-14 ENCOUNTER — Other Ambulatory Visit: Payer: Self-pay | Admitting: Urology

## 2021-07-14 NOTE — Telephone Encounter (Signed)
I called patient and scheduled appt for next available to see Dr. [name]. Patient denies any issues.

## 2021-07-14 NOTE — Telephone Encounter (Signed)
Pt has not been seen by urology in more than one year. Rx for Sodium Bicarb refilled for 30 days/1 rf. Pt needs appt prior to additional RF.

## 2021-08-02 ENCOUNTER — Inpatient Hospital Stay: Payer: Medicare HMO

## 2021-08-02 ENCOUNTER — Inpatient Hospital Stay: Payer: Medicare HMO | Admitting: Family

## 2021-08-07 ENCOUNTER — Other Ambulatory Visit: Payer: Self-pay | Admitting: Physician Assistant

## 2021-08-17 ENCOUNTER — Other Ambulatory Visit: Payer: Self-pay

## 2021-08-17 ENCOUNTER — Inpatient Hospital Stay: Payer: Medicare HMO | Attending: Family

## 2021-08-17 ENCOUNTER — Encounter: Payer: Self-pay | Admitting: Family

## 2021-08-17 ENCOUNTER — Inpatient Hospital Stay: Payer: Medicare HMO | Admitting: Family

## 2021-08-17 VITALS — BP 127/51 | HR 80 | Temp 97.9°F | Resp 19 | Ht 65.0 in | Wt 179.1 lb

## 2021-08-17 DIAGNOSIS — Z79899 Other long term (current) drug therapy: Secondary | ICD-10-CM | POA: Diagnosis not present

## 2021-08-17 DIAGNOSIS — K589 Irritable bowel syndrome without diarrhea: Secondary | ICD-10-CM | POA: Diagnosis not present

## 2021-08-17 DIAGNOSIS — D500 Iron deficiency anemia secondary to blood loss (chronic): Secondary | ICD-10-CM

## 2021-08-17 LAB — CBC WITH DIFFERENTIAL (CANCER CENTER ONLY)
Abs Immature Granulocytes: 0.12 10*3/uL — ABNORMAL HIGH (ref 0.00–0.07)
Basophils Absolute: 0.1 10*3/uL (ref 0.0–0.1)
Basophils Relative: 1 %
Eosinophils Absolute: 0.1 10*3/uL (ref 0.0–0.5)
Eosinophils Relative: 1 %
HCT: 44.9 % (ref 36.0–46.0)
Hemoglobin: 14 g/dL (ref 12.0–15.0)
Immature Granulocytes: 1 %
Lymphocytes Relative: 25 %
Lymphs Abs: 2.5 10*3/uL (ref 0.7–4.0)
MCH: 28.6 pg (ref 26.0–34.0)
MCHC: 31.2 g/dL (ref 30.0–36.0)
MCV: 91.8 fL (ref 80.0–100.0)
Monocytes Absolute: 0.8 10*3/uL (ref 0.1–1.0)
Monocytes Relative: 8 %
Neutro Abs: 6.3 10*3/uL (ref 1.7–7.7)
Neutrophils Relative %: 64 %
Platelet Count: 281 10*3/uL (ref 150–400)
RBC: 4.89 MIL/uL (ref 3.87–5.11)
RDW: 15.7 % — ABNORMAL HIGH (ref 11.5–15.5)
WBC Count: 10 10*3/uL (ref 4.0–10.5)
nRBC: 0 % (ref 0.0–0.2)

## 2021-08-17 LAB — RETICULOCYTES
Immature Retic Fract: 19.3 % — ABNORMAL HIGH (ref 2.3–15.9)
RBC.: 4.84 MIL/uL (ref 3.87–5.11)
Retic Count, Absolute: 117.6 10*3/uL (ref 19.0–186.0)
Retic Ct Pct: 2.4 % (ref 0.4–3.1)

## 2021-08-17 NOTE — Progress Notes (Signed)
Hematology and Oncology Follow Up Visit  ZERINA HALLINAN [id number] 1946/04/20 76 y.o. 08/17/2021   Principle Diagnosis:  Iron deficiency anemia due to chronic blood loss with IBS (Crohn's vs. UC)   Current Therapy:  IV iron as indicated    Interim History:  Ms. Furber is here today for follow-up. She is doing well but has some occasional fatigue and SOB with over exertion.  She has not noted any obvious blood loss. No bruising or petechiae.  She is now on Sulfasalazine and states that she has not had any recent GI flares.  No fever, chills, n/v, cough, rash, dizziness, chest pain, palpitations, abdominal pain or changes in bowel or bladder habits.  No swelling, tenderness, numbness or tingling in her extremities.  No falls or syncope.  She is eating well and staying hydrated. Her weight is stable at 179 lbs.   ECOG Performance Status: 1 - Symptomatic but completely ambulatory  Medications:  Allergies as of 08/17/2021       Reactions   Tramadol Other (See Comments)   Says "blew my head up."        Medication List        Accurate as of August 17, 2021  2:08 PM. If you have any questions, ask your nurse or doctor.          acidophilus Caps capsule Take 1 capsule by mouth daily.   ALPRAZolam 0.5 MG tablet Commonly known as: XANAX Take 0.5 mg by mouth at bedtime as needed for sleep.   amLODipine 2.5 MG tablet Commonly known as: NORVASC Take 1 tablet (2.5 mg total) by mouth daily as needed (high BP). What changed: when to take this   bisacodyl 5 MG EC tablet Generic drug: bisacodyl Take 5 mg by mouth daily as needed for moderate constipation.   cholecalciferol 25 MCG (1000 UNIT) tablet Commonly known as: VITAMIN D3 Take 2,000 Units by mouth daily.   gabapentin 300 MG capsule Commonly known as: NEURONTIN Take 300 mg by mouth in the morning and at bedtime.   hydrocortisone 25 MG suppository Commonly known as: ANUSOL-HC Place 1 suppository (25 mg total)  rectally at bedtime. For 3-4 nights.   levothyroxine 50 MCG tablet Commonly known as: SYNTHROID Take 50 mcg by mouth every morning.   multivitamin with minerals Tabs tablet Take 1 tablet by mouth daily.   pantoprazole 40 MG tablet Commonly known as: PROTONIX Take 1 tablet (40 mg total) by mouth 2 (two) times daily before a meal. What changed: when to take this   sodium bicarbonate 650 MG tablet TAKE 1 TABLET BY MOUTH EVERY DAY   sulfaSALAzine 500 MG tablet Commonly known as: AZULFIDINE Take 1 tablet (500 mg total) by mouth 2 (two) times daily.   VITAMIN B-12 PO Take 2,000 mg by mouth.        Allergies:  Allergies  Allergen Reactions   Tramadol Other (See Comments)    Says "blew my head up."    Past Medical History, Surgical history, Social history, and Family History were reviewed and updated.  Review of Systems: All other 10 point review of systems is negative.   Physical Exam:  height is 5\' 5"  (1.651 m) and weight is 179 lb 1.9 oz (81.2 kg). Her oral temperature is 97.9 F (36.6 C). Her blood pressure is 127/51 (abnormal) and her pulse is 80. Her respiration is 19 and oxygen saturation is 96%.   Wt Readings from Last 3 Encounters:  08/17/21 179 lb 1.9 oz (  81.2 kg)  05/26/21 176 lb (79.8 kg)  04/01/21 176 lb (79.8 kg)    Ocular: Sclerae unicteric, pupils equal, round and reactive to light Ear-nose-throat: Oropharynx clear, dentition fair Lymphatic: No cervical or supraclavicular adenopathy Lungs no rales or rhonchi, good excursion bilaterally Heart regular rate and rhythm, no murmur appreciated Abd soft, nontender, positive bowel sounds MSK no focal spinal tenderness, no joint edema Neuro: non-focal, well-oriented, appropriate affect Breasts: Deferred   Lab Results  Component Value Date   WBC 10.0 08/17/2021   HGB 14.0 08/17/2021   HCT 44.9 08/17/2021   MCV 91.8 08/17/2021   PLT 281 08/17/2021   Lab Results  Component Value Date   FERRITIN 151.7  05/26/2021   IRON 34 (L) 05/26/2021   TIBC 291.2 05/26/2021   UIBC 309 04/01/2021   IRONPCTSAT 11.7 (L) 05/26/2021   Lab Results  Component Value Date   RETICCTPCT 2.4 08/17/2021   RBC 4.89 08/17/2021   RBC 4.84 08/17/2021   RETICCTABS 62,860 12/09/2019   No results found for: Nils Pyle [facility] Lab Results  Component Value Date   IGGSERUM 870 12/09/2019   No results found for: Odetta Pink, SPEI   Chemistry      Component Value Date/Time   NA 141 05/26/2021 1417   NA 142 03/19/2019 0000   K 4.5 05/26/2021 1417   CL 105 05/26/2021 1417   CO2 29 05/26/2021 1417   BUN 16 05/26/2021 1417   BUN 16 03/19/2019 0000   CREATININE 1.11 05/26/2021 1417   CREATININE 1.14 (H) 08/30/2020 0813   GLU 101 03/19/2019 0000      Component Value Date/Time   CALCIUM 9.4 05/26/2021 1417   ALKPHOS 86 05/26/2021 1417   AST 13 05/26/2021 1417   AST 17 08/30/2020 0813   ALT 8 05/26/2021 1417   ALT 11 08/30/2020 0813   BILITOT 0.4 05/26/2021 1417   BILITOT 0.4 08/30/2020 0813       Impression and Plan: Ms. Velador is a very pleasant 76 yo caucasian female with iron deficiency secondary to chronic blood loss with IBS (Crohn's vs. UC).  Iron studies are pending.  Follow-up in 4 months.   Lottie Dawson, NP 1/11/20232:08 PM

## 2021-08-18 LAB — IRON AND IRON BINDING CAPACITY (CC-WL,HP ONLY)
Iron: 37 ug/dL (ref 28–170)
Saturation Ratios: 13 % (ref 10.4–31.8)
TIBC: 291 ug/dL (ref 250–450)
UIBC: 254 ug/dL (ref 148–442)

## 2021-08-18 LAB — FERRITIN: Ferritin: 128 ng/mL (ref 11–307)

## 2021-08-19 ENCOUNTER — Telehealth: Payer: Self-pay | Admitting: *Deleted

## 2021-08-19 ENCOUNTER — Telehealth: Payer: Self-pay

## 2021-08-19 NOTE — Telephone Encounter (Signed)
Per 08/17/21 los - called and gave upcoming appointments - confirmed

## 2021-08-19 NOTE — Telephone Encounter (Signed)
Called patient to let her know that her iron level has improved and at this time no IV iron is indicated. Pt appreciative of call and happy she does not need any IV iron. Pt educated that she can call office with any questions or concerns. Pt verbalized understanding and had no further questions.

## 2021-08-19 NOTE — Telephone Encounter (Deleted)
-----   Message from Volanda Napoleon, MD sent at 08/18/2021  5:29 PM EST ----- Please call her let her know that the iron level is getting better.  She does not need any IV iron right now.  Laurey Arrow

## 2021-08-31 ENCOUNTER — Ambulatory Visit: Payer: Medicare HMO | Admitting: Urology

## 2021-08-31 DIAGNOSIS — R300 Dysuria: Secondary | ICD-10-CM

## 2021-09-06 ENCOUNTER — Other Ambulatory Visit: Payer: Self-pay | Admitting: Physician Assistant

## 2021-09-09 DIAGNOSIS — Z0001 Encounter for general adult medical examination with abnormal findings: Secondary | ICD-10-CM | POA: Diagnosis not present

## 2021-09-09 DIAGNOSIS — E7800 Pure hypercholesterolemia, unspecified: Secondary | ICD-10-CM | POA: Diagnosis not present

## 2021-09-09 DIAGNOSIS — G4700 Insomnia, unspecified: Secondary | ICD-10-CM | POA: Diagnosis not present

## 2021-09-09 DIAGNOSIS — Z79899 Other long term (current) drug therapy: Secondary | ICD-10-CM | POA: Diagnosis not present

## 2021-09-09 DIAGNOSIS — E039 Hypothyroidism, unspecified: Secondary | ICD-10-CM | POA: Diagnosis not present

## 2021-09-09 DIAGNOSIS — K50918 Crohn's disease, unspecified, with other complication: Secondary | ICD-10-CM | POA: Diagnosis not present

## 2021-09-09 DIAGNOSIS — E559 Vitamin D deficiency, unspecified: Secondary | ICD-10-CM | POA: Diagnosis not present

## 2021-09-09 DIAGNOSIS — Z23 Encounter for immunization: Secondary | ICD-10-CM | POA: Diagnosis not present

## 2021-09-09 DIAGNOSIS — I10 Essential (primary) hypertension: Secondary | ICD-10-CM | POA: Diagnosis not present

## 2021-09-13 ENCOUNTER — Telehealth: Payer: Self-pay | Admitting: Gastroenterology

## 2021-09-13 NOTE — Telephone Encounter (Signed)
Inbound call from patient, stated that she was given Azulfidine in a different type of pill that was making her use the bathroom 12 to 14 times a day and that it was a small pill. Patient is requesting a new proscription with the same pill she took before. Seeking advice, please advise.

## 2021-09-14 MED ORDER — SULFASALAZINE 500 MG PO TABS: 500.0000 mg | ORAL_TABLET | Freq: Two times a day (BID) | ORAL | 3 refills | Status: DC

## 2021-09-14 NOTE — Telephone Encounter (Signed)
Called patient to figure exactly what it was that she needed. She states that she just needs a refill on her Sulfasalazine. She was confused about tablets, but had already called and spoke with pharmacist regarding that. Refill for Sulfasalazine has been sent to pharmacy and patient will let me know if she needs anything further.

## 2021-10-04 ENCOUNTER — Other Ambulatory Visit: Payer: Self-pay | Admitting: Physician Assistant

## 2021-11-17 ENCOUNTER — Other Ambulatory Visit: Payer: Self-pay

## 2021-11-17 MED ORDER — SODIUM BICARBONATE 650 MG PO TABS: 650.0000 mg | ORAL_TABLET | Freq: Every day | ORAL | 0 refills | Status: AC

## 2021-12-13 ENCOUNTER — Other Ambulatory Visit: Payer: Self-pay | Admitting: Physician Assistant

## 2021-12-16 ENCOUNTER — Other Ambulatory Visit: Payer: Medicare HMO

## 2021-12-16 ENCOUNTER — Encounter: Payer: Self-pay | Admitting: Gastroenterology

## 2021-12-16 ENCOUNTER — Ambulatory Visit: Payer: Medicare HMO | Admitting: Family

## 2022-01-04 ENCOUNTER — Telehealth: Payer: Self-pay | Admitting: Gastroenterology

## 2022-01-04 NOTE — Telephone Encounter (Signed)
Patient called states she received the recall letter however she said she has been doing great with the medication. Would like to ask you if she in fact still needs to get another colonoscopy.

## 2022-01-04 NOTE — Telephone Encounter (Signed)
Called patient to advise her of Dr. Donneta Romberg recommendations below left voicemail.

## 2022-01-04 NOTE — Telephone Encounter (Signed)
She does need the follow up colonoscopy, but if she wants to talk further in clinic before scheduling, then please get her set up for a clinic visit.  OK to use a held slot or a 350PM slot. Let me know what she decides. Thanks. GM

## 2022-02-03 ENCOUNTER — Other Ambulatory Visit: Payer: Self-pay | Admitting: Urology

## 2022-02-03 ENCOUNTER — Other Ambulatory Visit: Payer: Self-pay | Admitting: Gastroenterology

## 2022-02-05 IMAGING — DX DG CHEST 1V PORT
1 series · 1 of 1 positions shown · non-contrast
Comparison: October 14, 2019.

CLINICAL DATA: Weakness.

EXAM:
PORTABLE CHEST 1 VIEW

[chest ap]
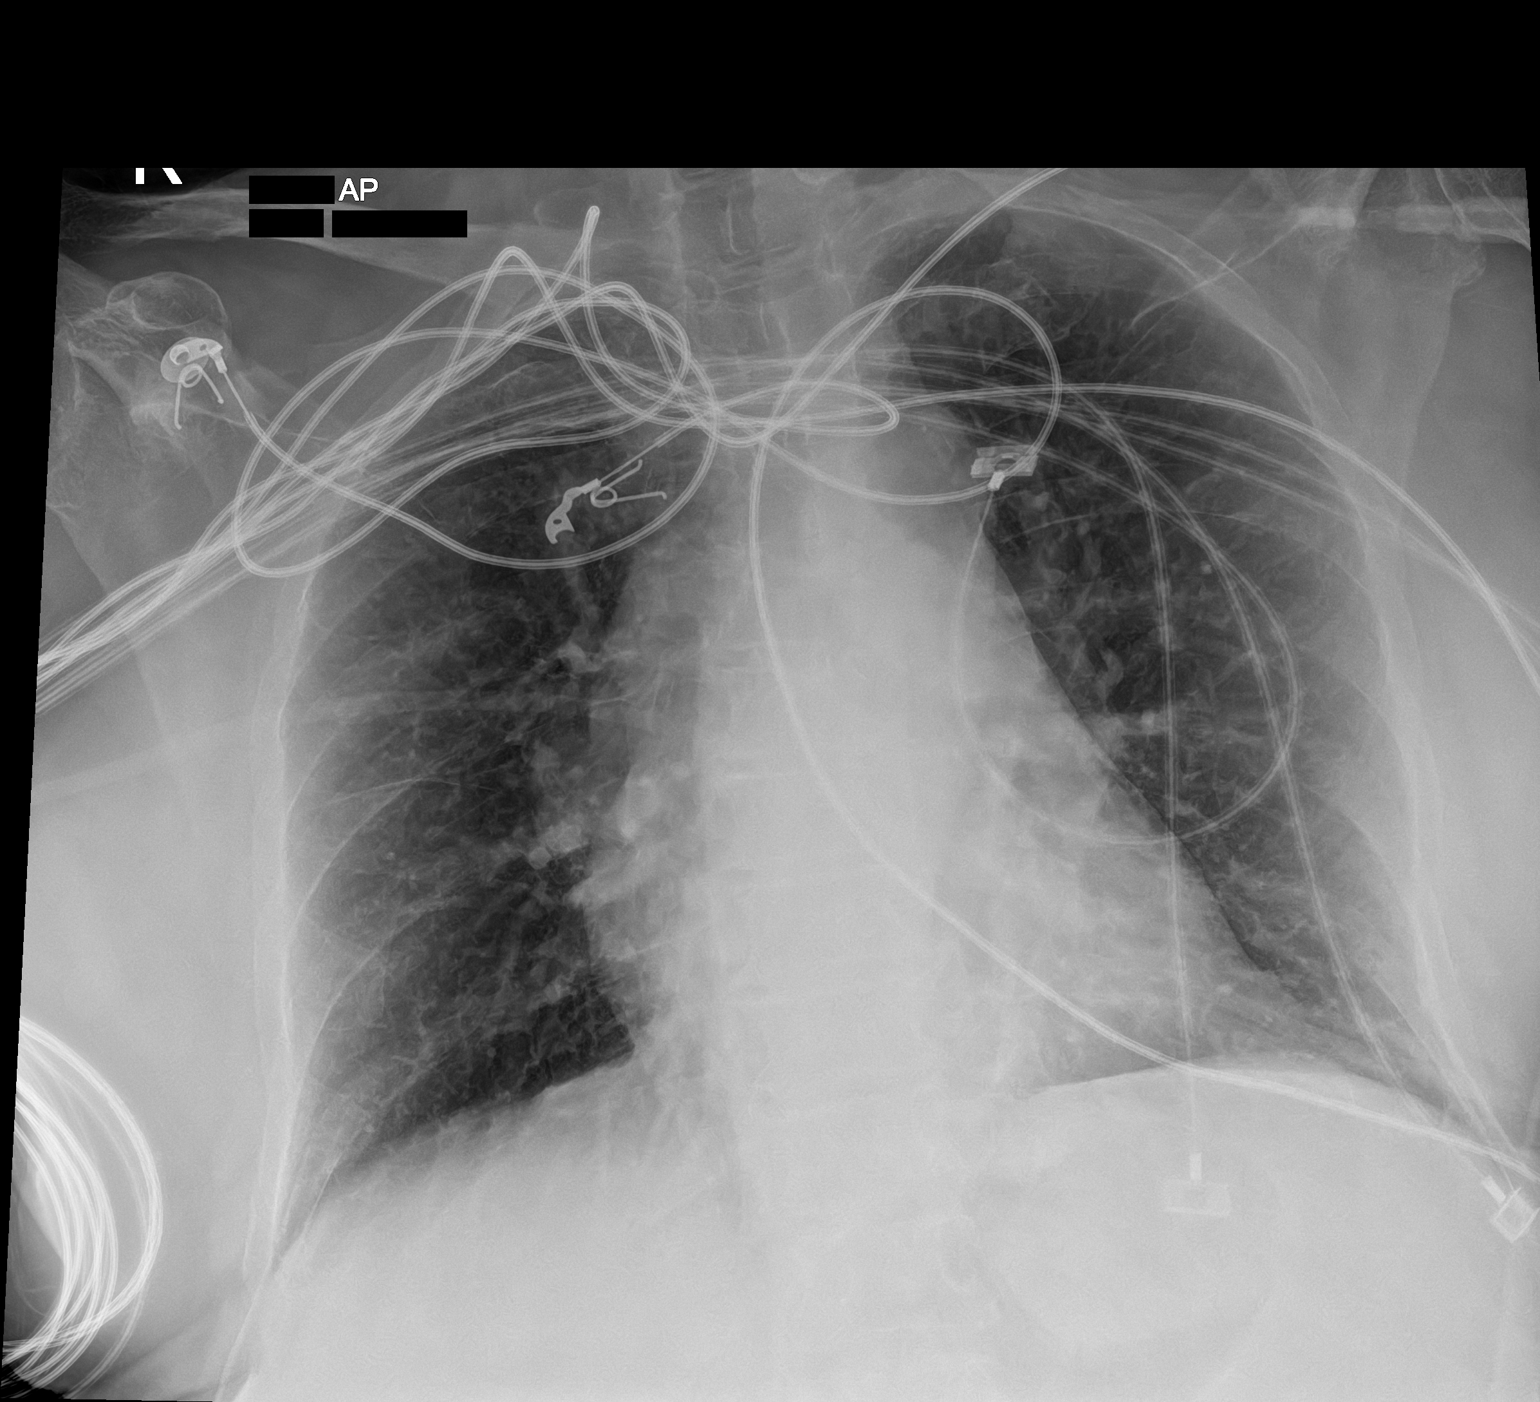

[1 of 1 positions shown; findings below may reference images not displayed]

FINDINGS: The heart size and mediastinal contours are within normal limits.
Both lungs are clear. The visualized skeletal structures are
unremarkable.
IMPRESSION: No active disease.

## 2022-02-05 IMAGING — CT CT ABD-PELV W/O CM
2 of 4 series · 16 of 46 positions shown, 18 images · non-contrast
Comparison: 10/13/2019

CLINICAL DATA: Weakness, vomiting, diarrhea

EXAM:
CT ABDOMEN AND PELVIS WITHOUT CONTRAST
TECHNIQUE: Multidetector CT imaging of the abdomen and pelvis was performed
following the standard protocol without IV contrast.

[Series 2: axial st · axial · 0.71mm/px · z∈[-463,-38]mm · 13 of 97 slices shown, 15 images]
[im 6/97  soft-tissue]
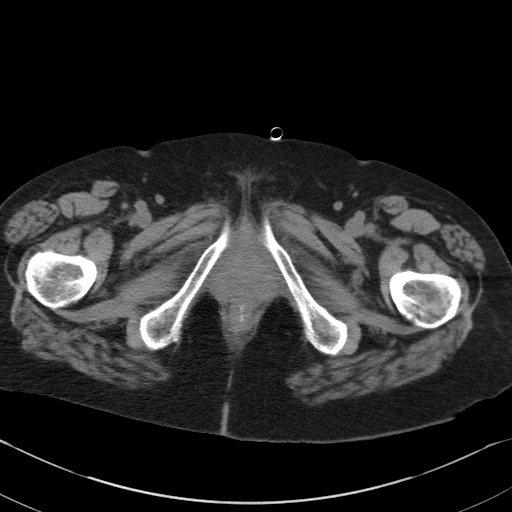
[im 6/97  bone]
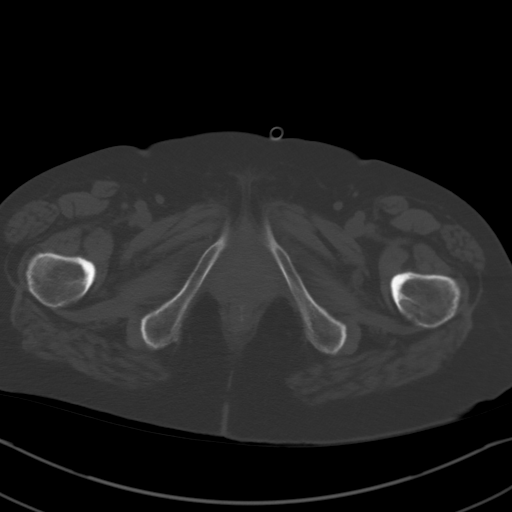
[im 12/97  soft-tissue]
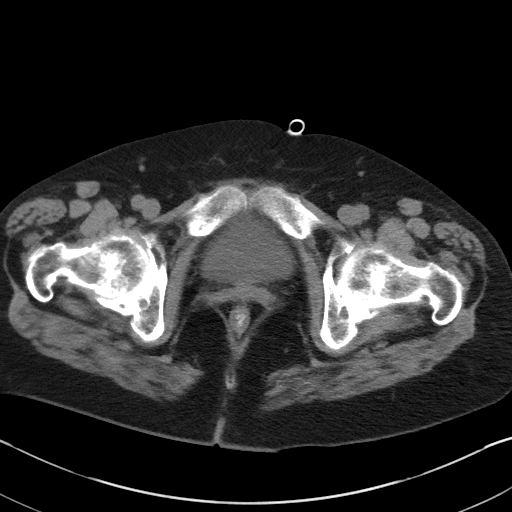
[im 23/97  soft-tissue]
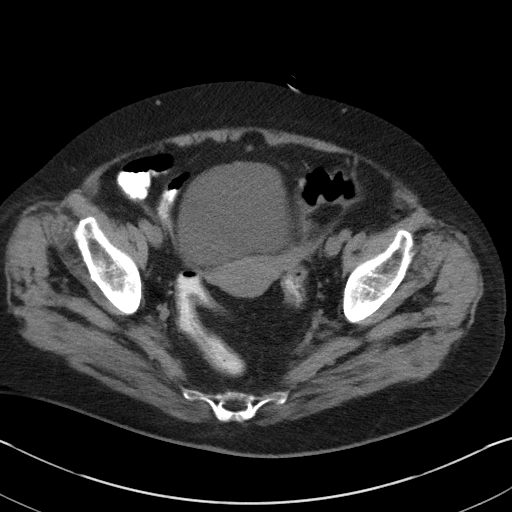
[im 29/97  soft-tissue]
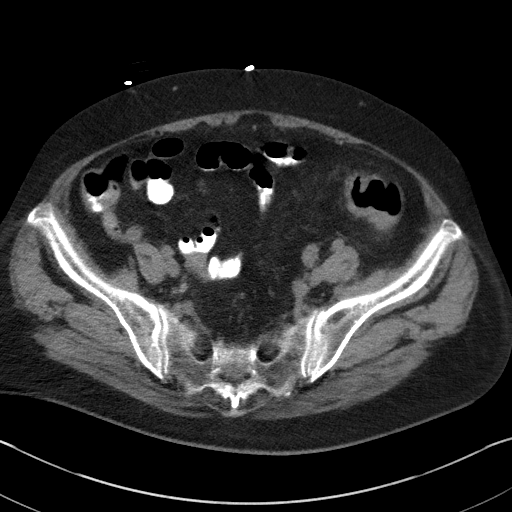
[im 34/97  soft-tissue]
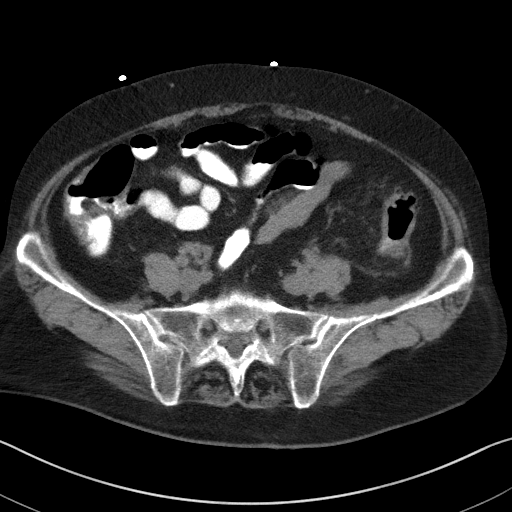
[im 40/97  soft-tissue]
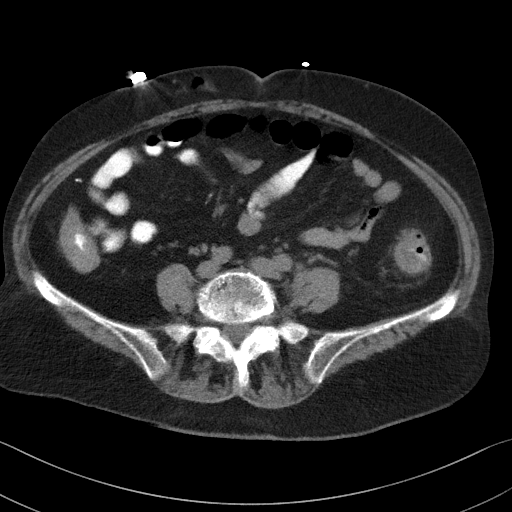
[im 51/97  soft-tissue]
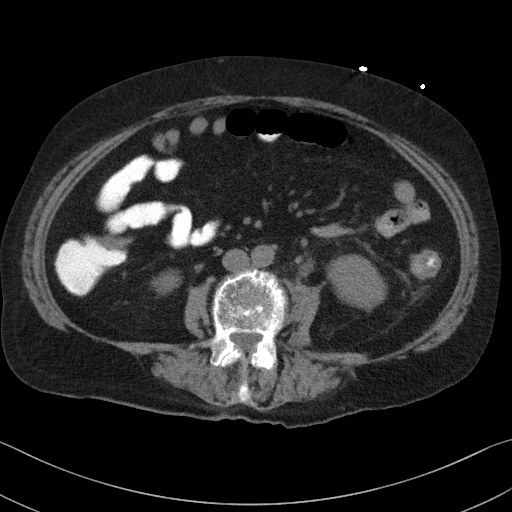
[im 57/97  soft-tissue]
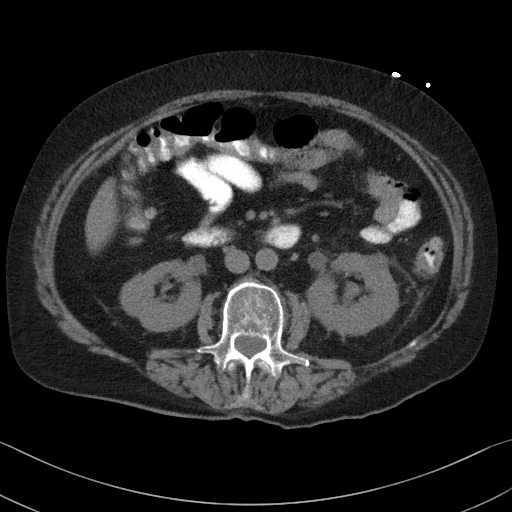
[im 63/97  soft-tissue]
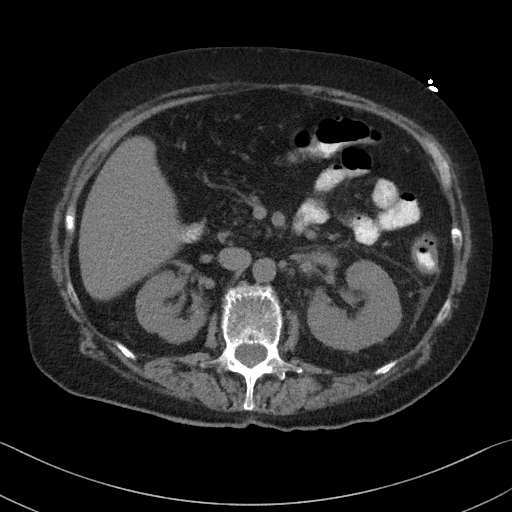
[im 63/97  bone]
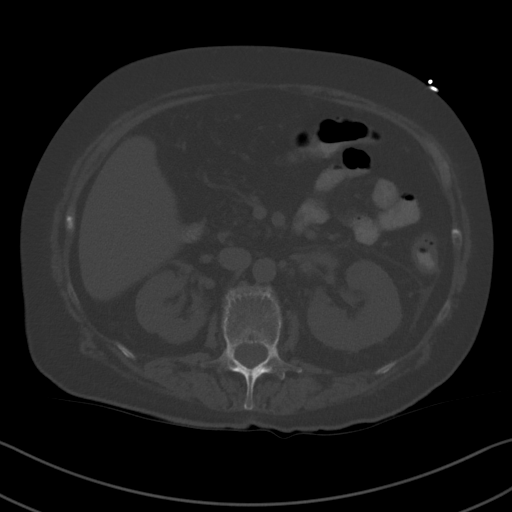
[im 68/97  soft-tissue]
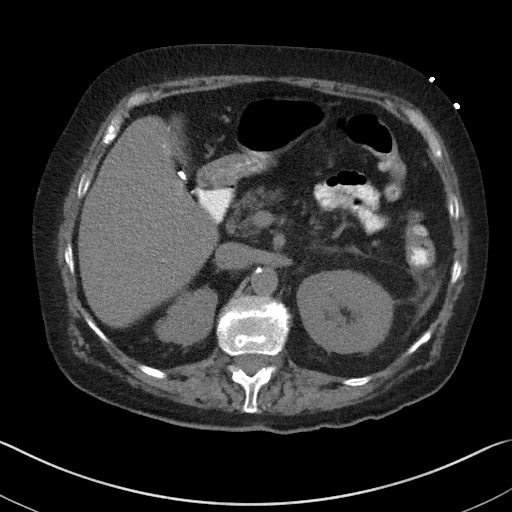
[im 74/97  soft-tissue]
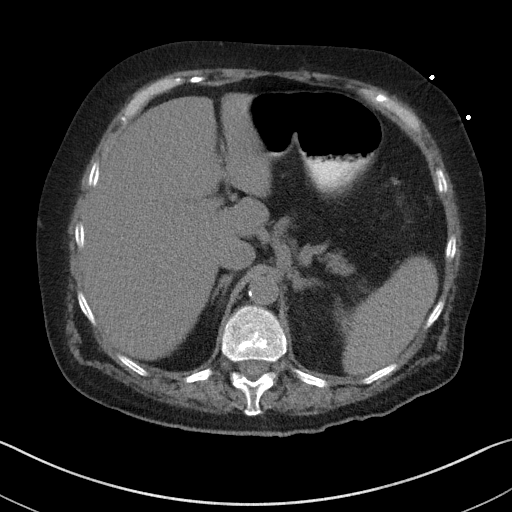
[im 85/97  soft-tissue]
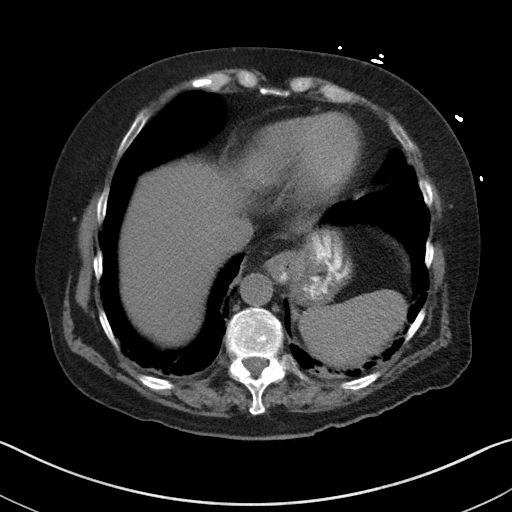
[im 91/97  soft-tissue]
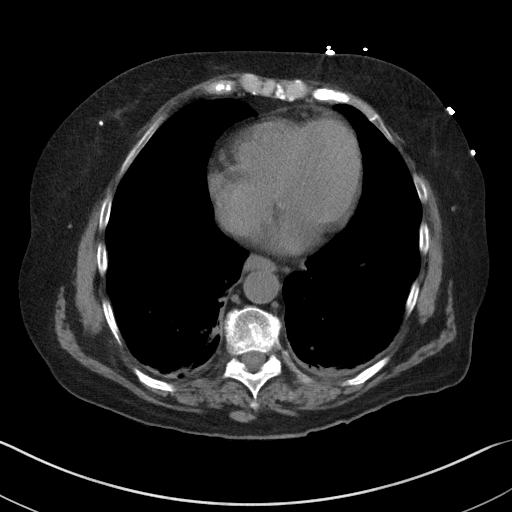

[Series 5: coronal st · coronal · 0.70mm/px · 3 of 100 slices shown]
[im 34/100  soft-tissue]
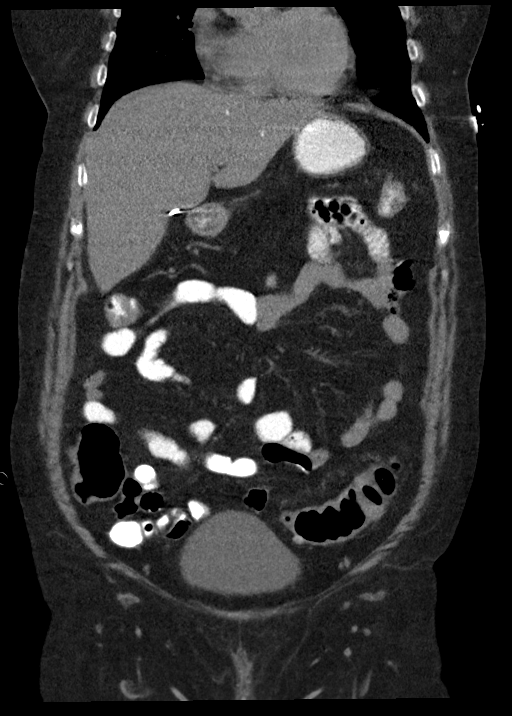
[im 45/100  soft-tissue]
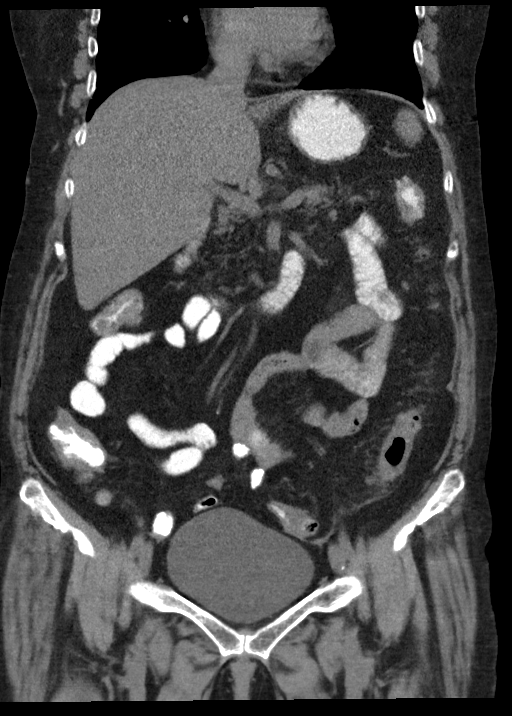
[im 56/100  soft-tissue]
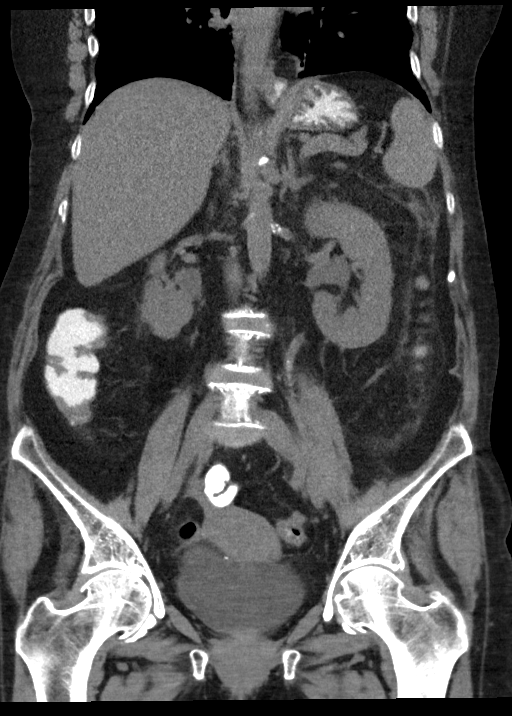

[16 of 46 positions shown; findings below may reference images not displayed]

FINDINGS: Lower chest: Bibasilar atelectasis/scarring.

Hepatobiliary: Few scattered calcified granulomas liver.
Cholecystectomy. No unexpected biliary dilatation.

Pancreas: Unremarkable apart from fatty replacement.

Spleen: Unremarkable.

Adrenals/Urinary Tract: Stable 2 cm lesion of the left adrenal with
stability since 7676 suggesting an adenoma. Right adrenal is
unremarkable. Right kidney is unremarkable. There is mild left
hydronephrosis and proximal hydroureter. Obstructing calculus or
calculi present within the mid left ureter measuring 2 mm axially
and 2.5 cm craniocaudally. Bladder is unremarkable.

Stomach/Bowel: Stomach is within normal limits. Bowel is normal in
caliber. There is distal colonic diverticulosis. Wall thickening
involving much of the colon.

Vascular/Lymphatic: Mild aortic atherosclerosis. No enlarged lymph
nodes identified.

Reproductive: Uterus and bilateral adnexa are unremarkable.

Other: No ascites.  Abdominal wall is unremarkable.

Musculoskeletal: New but chronic appearing compression deformity of
L1 and L2. Degenerative changes of the included spine and hips.
IMPRESSION: Obstructing calculus or adjacent calculi within the mid left ureter
with mild proximal hydroureteronephrosis.

Wall thickening involving much of the colon, which could be on the
basis of underdistention or reflect infectious/inflammatory colitis
given provided history.

Distal colonic diverticulosis.

## 2022-02-10 ENCOUNTER — Telehealth: Payer: Self-pay | Admitting: *Deleted

## 2022-02-10 NOTE — Telephone Encounter (Signed)
Called and lvm about rescheduling appointment - requested call back to confirm

## 2022-02-22 ENCOUNTER — Inpatient Hospital Stay: Payer: Medicare HMO

## 2022-02-22 ENCOUNTER — Ambulatory Visit: Payer: Medicare HMO | Admitting: Family

## 2022-03-01 ENCOUNTER — Encounter: Payer: Self-pay | Admitting: Family

## 2022-03-01 ENCOUNTER — Inpatient Hospital Stay: Payer: Medicare HMO | Attending: Hematology & Oncology

## 2022-03-01 ENCOUNTER — Inpatient Hospital Stay: Payer: Medicare HMO | Admitting: Family

## 2022-03-01 VITALS — BP 143/49 | HR 76 | Temp 97.8°F | Resp 17 | Wt 182.8 lb

## 2022-03-01 DIAGNOSIS — D500 Iron deficiency anemia secondary to blood loss (chronic): Secondary | ICD-10-CM

## 2022-03-01 DIAGNOSIS — K922 Gastrointestinal hemorrhage, unspecified: Secondary | ICD-10-CM | POA: Diagnosis not present

## 2022-03-01 DIAGNOSIS — E559 Vitamin D deficiency, unspecified: Secondary | ICD-10-CM

## 2022-03-01 DIAGNOSIS — K921 Melena: Secondary | ICD-10-CM | POA: Diagnosis not present

## 2022-03-01 DIAGNOSIS — K589 Irritable bowel syndrome without diarrhea: Secondary | ICD-10-CM | POA: Insufficient documentation

## 2022-03-01 DIAGNOSIS — E538 Deficiency of other specified B group vitamins: Secondary | ICD-10-CM

## 2022-03-01 DIAGNOSIS — R0602 Shortness of breath: Secondary | ICD-10-CM | POA: Insufficient documentation

## 2022-03-01 DIAGNOSIS — R5383 Other fatigue: Secondary | ICD-10-CM | POA: Insufficient documentation

## 2022-03-01 DIAGNOSIS — K5900 Constipation, unspecified: Secondary | ICD-10-CM | POA: Insufficient documentation

## 2022-03-01 DIAGNOSIS — Z79899 Other long term (current) drug therapy: Secondary | ICD-10-CM | POA: Insufficient documentation

## 2022-03-01 LAB — CBC WITH DIFFERENTIAL (CANCER CENTER ONLY)
Abs Immature Granulocytes: 0.04 10*3/uL (ref 0.00–0.07)
Basophils Absolute: 0.1 10*3/uL (ref 0.0–0.1)
Basophils Relative: 1 %
Eosinophils Absolute: 0.2 10*3/uL (ref 0.0–0.5)
Eosinophils Relative: 2 %
HCT: 46.6 % — ABNORMAL HIGH (ref 36.0–46.0)
Hemoglobin: 14.5 g/dL (ref 12.0–15.0)
Immature Granulocytes: 0 %
Lymphocytes Relative: 22 %
Lymphs Abs: 2.3 10*3/uL (ref 0.7–4.0)
MCH: 28.3 pg (ref 26.0–34.0)
MCHC: 31.1 g/dL (ref 30.0–36.0)
MCV: 91 fL (ref 80.0–100.0)
Monocytes Absolute: 0.8 10*3/uL (ref 0.1–1.0)
Monocytes Relative: 8 %
Neutro Abs: 6.9 10*3/uL (ref 1.7–7.7)
Neutrophils Relative %: 67 %
Platelet Count: 284 10*3/uL (ref 150–400)
RBC: 5.12 MIL/uL — ABNORMAL HIGH (ref 3.87–5.11)
RDW: 15.4 % (ref 11.5–15.5)
WBC Count: 10.3 10*3/uL (ref 4.0–10.5)
nRBC: 0 % (ref 0.0–0.2)

## 2022-03-01 LAB — IRON AND IRON BINDING CAPACITY (CC-WL,HP ONLY)
Iron: 43 ug/dL (ref 28–170)
Saturation Ratios: 14 % (ref 10.4–31.8)
TIBC: 300 ug/dL (ref 250–450)
UIBC: 257 ug/dL (ref 148–442)

## 2022-03-01 LAB — FERRITIN: Ferritin: 76 ng/mL (ref 11–307)

## 2022-03-01 LAB — RETICULOCYTES
Immature Retic Fract: 19.9 % — ABNORMAL HIGH (ref 2.3–15.9)
RBC.: 5.06 MIL/uL (ref 3.87–5.11)
Retic Count, Absolute: 115.4 K/uL (ref 19.0–186.0)
Retic Ct Pct: 2.3 % (ref 0.4–3.1)

## 2022-03-01 LAB — VITAMIN D 25 HYDROXY (VIT D DEFICIENCY, FRACTURES): Vit D, 25-Hydroxy: 54.65 ng/mL (ref 30–100)

## 2022-03-01 LAB — VITAMIN B12: Vitamin B-12: 1956 pg/mL — ABNORMAL HIGH (ref 180–914)

## 2022-03-01 NOTE — Progress Notes (Signed)
Hematology and Oncology Follow Up Visit  Rhonda Bailey [id number] 02-04-1946 76 y.o. 03/01/2022   Principle Diagnosis:  Iron deficiency anemia due to chronic blood loss with IBS (Crohn's vs. UC)   Current Therapy:  IV iron as indicated    Interim History:  Rhonda Bailey is here today with her daughter for follow-up. She is doing well and has no complaints at this time.  She does note some fatigue and SOB with over exertion and will take a break to rest and hydrate when needed.  No fever, chills, n/v, cough, rash, dizziness, chest pain, palpitations, abdominal pain or changes in bowel or bladder habits . She has occasional blood in her stool with hemorrhoids and straining. No other blood loss noted. No bruising or petechiae.  No swelling, tenderness, numbness or tingling in her extremities at this time.  No falls or syncope reported.  Appetite and hydration are good. Weight is stable at 182 lbs.   ECOG Performance Status: 1 - Symptomatic but completely ambulatory  Medications:  Allergies as of 03/01/2022       Reactions   Tramadol Other (See Comments)   Says "blew my head up."        Medication List        Accurate as of March 01, 2022  9:59 AM. If you have any questions, ask your nurse or doctor.          acidophilus Caps capsule Take 1 capsule by mouth daily.   ALPRAZolam 0.5 MG tablet Commonly known as: XANAX Take 0.5 mg by mouth at bedtime as needed for sleep.   amLODipine 2.5 MG tablet Commonly known as: NORVASC Take 1 tablet (2.5 mg total) by mouth daily as needed (high BP). What changed: when to take this   bisacodyl 5 MG EC tablet Generic drug: bisacodyl Take 5 mg by mouth daily as needed for moderate constipation.   cholecalciferol 25 MCG (1000 UNIT) tablet Commonly known as: VITAMIN D3 Take 2,000 Units by mouth daily.   gabapentin 300 MG capsule Commonly known as: NEURONTIN Take 300 mg by mouth in the morning and at bedtime.   hydrocortisone 25 MG  suppository Commonly known as: ANUSOL-HC Place 1 suppository (25 mg total) rectally at bedtime. For 3-4 nights.   levothyroxine 50 MCG tablet Commonly known as: SYNTHROID Take 50 mcg by mouth every morning.   multivitamin with minerals Tabs tablet Take 1 tablet by mouth daily.   pantoprazole 40 MG tablet Commonly known as: PROTONIX Take 1 tablet (40 mg total) by mouth 2 (two) times daily before a meal. What changed: when to take this   sodium bicarbonate 650 MG tablet Take 1 tablet (650 mg total) by mouth daily.   sulfaSALAzine 500 MG tablet Commonly known as: AZULFIDINE TAKE 1 TABLET BY MOUTH TWICE A DAY   VITAMIN B-12 PO Take 2,000 mg by mouth.        Allergies:  Allergies  Allergen Reactions   Tramadol Other (See Comments)    Says "blew my head up."    Past Medical History, Surgical history, Social history, and Family History were reviewed and updated.  Review of Systems: All other 10 point review of systems is negative.   Physical Exam:  vitals were not taken for this visit.   Wt Readings from Last 3 Encounters:  08/17/21 179 lb 1.9 oz (81.2 kg)  05/26/21 176 lb (79.8 kg)  04/01/21 176 lb (79.8 kg)    Ocular: Sclerae unicteric, pupils equal, round and  reactive to light Ear-nose-throat: Oropharynx clear, dentition fair Lymphatic: No cervical or supraclavicular adenopathy Lungs no rales or rhonchi, good excursion bilaterally Heart regular rate and rhythm, no murmur appreciated Abd soft, nontender, positive bowel sounds MSK no focal spinal tenderness, no joint edema Neuro: non-focal, well-oriented, appropriate affect Breasts: Deferred   Lab Results  Component Value Date   WBC 10.3 03/01/2022   HGB 14.5 03/01/2022   HCT 46.6 (H) 03/01/2022   MCV 91.0 03/01/2022   PLT 284 03/01/2022   Lab Results  Component Value Date   FERRITIN 128 08/17/2021   IRON 37 08/17/2021   TIBC 291 08/17/2021   UIBC 254 08/17/2021   IRONPCTSAT 13 08/17/2021   Lab  Results  Component Value Date   RETICCTPCT 2.3 03/01/2022   RBC 5.06 03/01/2022   RETICCTABS 62,860 12/09/2019   No results found for: "KPAFRELGTCHN", "LAMBDASER", "KAPLAMBRATIO" Lab Results  Component Value Date   IGGSERUM 870 12/09/2019   No results found for: "TOTALPROTELP", "ALBUMINELP", "A1GS", "A2GS", "BETS", "BETA2SER", "GAMS", "MSPIKE", "SPEI"   Chemistry      Component Value Date/Time   NA 141 05/26/2021 1417   NA 142 03/19/2019 0000   K 4.5 05/26/2021 1417   CL 105 05/26/2021 1417   CO2 29 05/26/2021 1417   BUN 16 05/26/2021 1417   BUN 16 03/19/2019 0000   CREATININE 1.11 05/26/2021 1417   CREATININE 1.14 (H) 08/30/2020 0813   GLU 101 03/19/2019 0000      Component Value Date/Time   CALCIUM 9.4 05/26/2021 1417   ALKPHOS 86 05/26/2021 1417   AST 13 05/26/2021 1417   AST 17 08/30/2020 0813   ALT 8 05/26/2021 1417   ALT 11 08/30/2020 0813   BILITOT 0.4 05/26/2021 1417   BILITOT 0.4 08/30/2020 0813       Impression and Plan: Rhonda Bailey is a very pleasant 76 yo caucasian female with iron deficiency secondary to chronic blood loss with IBS (Crohn's vs. UC).  Iron studies pending.  Follow-up in 4 months.   Lottie Dawson, NP 7/26/20239:59 AM

## 2022-03-10 DIAGNOSIS — I10 Essential (primary) hypertension: Secondary | ICD-10-CM | POA: Diagnosis not present

## 2022-03-10 DIAGNOSIS — K50918 Crohn's disease, unspecified, with other complication: Secondary | ICD-10-CM | POA: Diagnosis not present

## 2022-03-10 DIAGNOSIS — G4700 Insomnia, unspecified: Secondary | ICD-10-CM | POA: Diagnosis not present

## 2022-03-10 DIAGNOSIS — E039 Hypothyroidism, unspecified: Secondary | ICD-10-CM | POA: Diagnosis not present

## 2022-03-10 DIAGNOSIS — E7800 Pure hypercholesterolemia, unspecified: Secondary | ICD-10-CM | POA: Diagnosis not present

## 2022-07-02 ENCOUNTER — Other Ambulatory Visit: Payer: Self-pay | Admitting: Gastroenterology

## 2022-07-03 ENCOUNTER — Encounter: Payer: Self-pay | Admitting: Oncology

## 2022-07-03 ENCOUNTER — Inpatient Hospital Stay: Payer: Medicare HMO | Admitting: Oncology

## 2022-07-03 ENCOUNTER — Inpatient Hospital Stay: Payer: Medicare HMO | Attending: Hematology & Oncology

## 2022-07-03 VITALS — BP 149/86 | HR 80 | Temp 97.9°F | Resp 18 | Ht 66.0 in | Wt 185.0 lb

## 2022-07-03 DIAGNOSIS — E611 Iron deficiency: Secondary | ICD-10-CM | POA: Insufficient documentation

## 2022-07-03 DIAGNOSIS — K922 Gastrointestinal hemorrhage, unspecified: Secondary | ICD-10-CM | POA: Diagnosis not present

## 2022-07-03 DIAGNOSIS — K649 Unspecified hemorrhoids: Secondary | ICD-10-CM | POA: Diagnosis not present

## 2022-07-03 DIAGNOSIS — K589 Irritable bowel syndrome without diarrhea: Secondary | ICD-10-CM | POA: Insufficient documentation

## 2022-07-03 DIAGNOSIS — M549 Dorsalgia, unspecified: Secondary | ICD-10-CM | POA: Insufficient documentation

## 2022-07-03 DIAGNOSIS — Z79899 Other long term (current) drug therapy: Secondary | ICD-10-CM | POA: Insufficient documentation

## 2022-07-03 DIAGNOSIS — D500 Iron deficiency anemia secondary to blood loss (chronic): Secondary | ICD-10-CM

## 2022-07-03 DIAGNOSIS — D508 Other iron deficiency anemias: Secondary | ICD-10-CM | POA: Diagnosis not present

## 2022-07-03 DIAGNOSIS — K921 Melena: Secondary | ICD-10-CM | POA: Diagnosis not present

## 2022-07-03 DIAGNOSIS — R5383 Other fatigue: Secondary | ICD-10-CM | POA: Diagnosis not present

## 2022-07-03 LAB — RETICULOCYTES
Immature Retic Fract: 21.1 % — ABNORMAL HIGH (ref 2.3–15.9)
RBC.: 5.16 MIL/uL — ABNORMAL HIGH (ref 3.87–5.11)
Retic Count, Absolute: 120.7 10*3/uL (ref 19.0–186.0)
Retic Ct Pct: 2.3 % (ref 0.4–3.1)

## 2022-07-03 LAB — CBC WITH DIFFERENTIAL (CANCER CENTER ONLY)
Abs Immature Granulocytes: 0.13 10*3/uL — ABNORMAL HIGH (ref 0.00–0.07)
Basophils Absolute: 0.1 10*3/uL (ref 0.0–0.1)
Basophils Relative: 1 %
Eosinophils Absolute: 0.2 10*3/uL (ref 0.0–0.5)
Eosinophils Relative: 2 %
HCT: 47.1 % — ABNORMAL HIGH (ref 36.0–46.0)
Hemoglobin: 14.1 g/dL (ref 12.0–15.0)
Immature Granulocytes: 1 %
Lymphocytes Relative: 27 %
Lymphs Abs: 2.4 10*3/uL (ref 0.7–4.0)
MCH: 27.8 pg (ref 26.0–34.0)
MCHC: 29.9 g/dL — ABNORMAL LOW (ref 30.0–36.0)
MCV: 92.7 fL (ref 80.0–100.0)
Monocytes Absolute: 0.7 10*3/uL (ref 0.1–1.0)
Monocytes Relative: 8 %
Neutro Abs: 5.6 10*3/uL (ref 1.7–7.7)
Neutrophils Relative %: 61 %
Platelet Count: 317 10*3/uL (ref 150–400)
RBC: 5.08 MIL/uL (ref 3.87–5.11)
RDW: 16 % — ABNORMAL HIGH (ref 11.5–15.5)
WBC Count: 9 10*3/uL (ref 4.0–10.5)
nRBC: 0 % (ref 0.0–0.2)

## 2022-07-03 LAB — IRON AND IRON BINDING CAPACITY (CC-WL,HP ONLY)
Iron: 53 ug/dL (ref 28–170)
Saturation Ratios: 17 % (ref 10.4–31.8)
TIBC: 307 ug/dL (ref 250–450)
UIBC: 254 ug/dL (ref 148–442)

## 2022-07-03 LAB — FERRITIN: Ferritin: 74 ng/mL (ref 11–307)

## 2022-07-03 MED ORDER — FUSION PLUS PO CAPS: 1.0000 | ORAL_CAPSULE | Freq: Every day | ORAL | 5 refills | Status: DC

## 2022-07-03 NOTE — Progress Notes (Signed)
Hematology and Oncology Follow Up Visit  Rhonda Bailey [id number] 03-09-1946 76 y.o. 07/03/2022   Principle Diagnosis:  Iron deficiency anemia due to chronic blood loss with IBS (Crohn's vs. UC)   Current Therapy:  IV iron as indicated    Interim History:  Rhonda Bailey is here today with her daughter for follow-up. She is doing well and has no complaints at this time.  Reports some mild fatigue.  She also reports back pain which is unchanged from baseline. No fever, chills, n/v, cough, rash, dizziness, chest pain, palpitations, abdominal pain or changes in bowel or bladder habits . She has occasional blood in her stool with hemorrhoids and straining. No other blood loss noted. No bruising or petechiae.  No swelling, tenderness, numbness or tingling in her extremities at this time.  No falls or syncope reported.  Appetite and hydration are good.   ECOG Performance Status: 1 - Symptomatic but completely ambulatory  Medications:  Allergies as of 07/03/2022       Reactions   Tramadol Other (See Comments)   Says "blew my head up."        Medication List        Accurate as of July 03, 2022 11:00 AM. If you have any questions, ask your nurse or doctor.          acidophilus Caps capsule Take 1 capsule by mouth daily.   ALPRAZolam 0.5 MG tablet Commonly known as: XANAX Take 0.5 mg by mouth at bedtime as needed for sleep.   amLODipine 2.5 MG tablet Commonly known as: NORVASC Take 1 tablet (2.5 mg total) by mouth daily as needed (high BP). What changed: when to take this   bisacodyl 5 MG EC tablet Generic drug: bisacodyl Take 5 mg by mouth daily as needed for moderate constipation.   cholecalciferol 25 MCG (1000 UNIT) tablet Commonly known as: VITAMIN D3 Take 2,000 Units by mouth daily.   gabapentin 300 MG capsule Commonly known as: NEURONTIN Take 300 mg by mouth in the morning and at bedtime.   hydrocortisone 25 MG suppository Commonly known as:  ANUSOL-HC Place 1 suppository (25 mg total) rectally at bedtime. For 3-4 nights.   levothyroxine 50 MCG tablet Commonly known as: SYNTHROID Take 50 mcg by mouth every morning.   multivitamin with minerals Tabs tablet Take 1 tablet by mouth daily.   pantoprazole 40 MG tablet Commonly known as: PROTONIX Take 1 tablet (40 mg total) by mouth 2 (two) times daily before a meal. What changed: when to take this   sodium bicarbonate 650 MG tablet Take 1 tablet (650 mg total) by mouth daily.   sulfaSALAzine 500 MG tablet Commonly known as: AZULFIDINE TAKE 1 TABLET BY MOUTH TWICE A DAY   VITAMIN B-12 PO Take 2,000 mg by mouth.        Allergies:  Allergies  Allergen Reactions   Tramadol Other (See Comments)    Says "blew my head up."    Past Medical History, Surgical history, Social history, and Family History were reviewed and updated.  Review of Systems: All other 10 point review of systems is negative.   Physical Exam:  height is 5' 6"  (1.676 m) and weight is 83.9 kg. Her oral temperature is 97.9 F (36.6 C). Her blood pressure is 149/86 (abnormal) and her pulse is 80. Her respiration is 18 and oxygen saturation is 95%.   Wt Readings from Last 3 Encounters:  07/03/22 83.9 kg  03/01/22 82.9 kg  08/17/21 81.2 kg  Ocular: Sclerae unicteric, pupils equal, round and reactive to light Ear-nose-throat: Oropharynx clear, dentition fair Lymphatic: No cervical or supraclavicular adenopathy Lungs no rales or rhonchi, good excursion bilaterally Heart regular rate and rhythm, no murmur appreciated Abd soft, nontender, positive bowel sounds MSK no focal spinal tenderness, no joint edema Neuro: non-focal, well-oriented, appropriate affect Breasts: Deferred   Lab Results  Component Value Date   WBC 9.0 07/03/2022   HGB 14.1 07/03/2022   HCT 47.1 (H) 07/03/2022   MCV 92.7 07/03/2022   PLT 317 07/03/2022   Lab Results  Component Value Date   FERRITIN 76 03/01/2022    IRON 43 03/01/2022   TIBC 300 03/01/2022   UIBC 257 03/01/2022   IRONPCTSAT 14 03/01/2022   Lab Results  Component Value Date   RETICCTPCT 2.3 07/03/2022   RBC 5.16 (H) 07/03/2022   RETICCTABS 62,860 12/09/2019   No results found for: "KPAFRELGTCHN", "LAMBDASER", "KAPLAMBRATIO" Lab Results  Component Value Date   IGGSERUM 870 12/09/2019   No results found for: "TOTALPROTELP", "ALBUMINELP", "A1GS", "A2GS", "BETS", "BETA2SER", "GAMS", "MSPIKE", "SPEI"   Chemistry      Component Value Date/Time   NA 141 05/26/2021 1417   NA 142 03/19/2019 0000   K 4.5 05/26/2021 1417   CL 105 05/26/2021 1417   CO2 29 05/26/2021 1417   BUN 16 05/26/2021 1417   BUN 16 03/19/2019 0000   CREATININE 1.11 05/26/2021 1417   CREATININE 1.14 (H) 08/30/2020 0813   GLU 101 03/19/2019 0000      Component Value Date/Time   CALCIUM 9.4 05/26/2021 1417   ALKPHOS 86 05/26/2021 1417   AST 13 05/26/2021 1417   AST 17 08/30/2020 0813   ALT 8 05/26/2021 1417   ALT 11 08/30/2020 0813   BILITOT 0.4 05/26/2021 1417   BILITOT 0.4 08/30/2020 0813       Impression and Plan: Rhonda Bailey is a very pleasant 76 yo caucasian female with iron deficiency secondary to chronic blood loss with IBS (Crohn's vs. UC).  CBC reviewed and is stable.  Iron studies are pending. Prescription for fusion plus once daily sent to her pharmacy per her request. Follow-up in 4 months.   Mikey Bussing, NP 11/27/202311:00 AM

## 2022-07-04 ENCOUNTER — Telehealth: Payer: Self-pay | Admitting: *Deleted

## 2022-07-04 ENCOUNTER — Telehealth: Payer: Self-pay

## 2022-07-04 NOTE — Telephone Encounter (Signed)
-----   Message from Celso Amy, NP sent at 07/04/2022  9:37 AM EST ----- Please let her know her iron studies look good! No intervention needed at this time.  ----- Message ----- From: Buel Ream, Lab In [place] Sent: 07/03/2022  10:12 AM EST To: Celso Amy, NP

## 2022-07-04 NOTE — Telephone Encounter (Signed)
Pt called back about her iron results, informed her iron results were good and no intervention needed at this time. Pt confirmed and states she will still try her iron supplements this month. Informed her that was okay.

## 2022-07-04 NOTE — Telephone Encounter (Signed)
-----   Message from Celso Amy, NP sent at 07/04/2022  9:37 AM EST ----- Please let her know her iron studies look good! No intervention needed at this time.  ----- Message ----- From: Buel Ream, Lab In [place] Sent: 07/03/2022  10:12 AM EST To: Celso Amy, NP

## 2022-07-04 NOTE — Telephone Encounter (Signed)
Called and lm for pt regarding iron studies

## 2022-08-15 ENCOUNTER — Other Ambulatory Visit: Payer: Self-pay | Admitting: Urology

## 2022-08-15 ENCOUNTER — Other Ambulatory Visit: Payer: Self-pay | Admitting: Gastroenterology

## 2022-09-11 ENCOUNTER — Other Ambulatory Visit: Payer: Self-pay | Admitting: Urology

## 2022-09-15 DIAGNOSIS — E7800 Pure hypercholesterolemia, unspecified: Secondary | ICD-10-CM | POA: Diagnosis not present

## 2022-09-15 DIAGNOSIS — I10 Essential (primary) hypertension: Secondary | ICD-10-CM | POA: Diagnosis not present

## 2022-09-15 DIAGNOSIS — N1831 Chronic kidney disease, stage 3a: Secondary | ICD-10-CM | POA: Diagnosis not present

## 2022-09-15 DIAGNOSIS — Z0001 Encounter for general adult medical examination with abnormal findings: Secondary | ICD-10-CM | POA: Diagnosis not present

## 2022-09-15 DIAGNOSIS — E559 Vitamin D deficiency, unspecified: Secondary | ICD-10-CM | POA: Diagnosis not present

## 2022-09-15 DIAGNOSIS — E039 Hypothyroidism, unspecified: Secondary | ICD-10-CM | POA: Diagnosis not present

## 2022-09-15 DIAGNOSIS — Z79899 Other long term (current) drug therapy: Secondary | ICD-10-CM | POA: Diagnosis not present

## 2022-09-15 DIAGNOSIS — E2839 Other primary ovarian failure: Secondary | ICD-10-CM | POA: Diagnosis not present

## 2022-09-15 DIAGNOSIS — K50918 Crohn's disease, unspecified, with other complication: Secondary | ICD-10-CM | POA: Diagnosis not present

## 2022-09-20 ENCOUNTER — Other Ambulatory Visit: Payer: Self-pay | Admitting: Family Medicine

## 2022-09-20 DIAGNOSIS — E2839 Other primary ovarian failure: Secondary | ICD-10-CM

## 2022-09-23 ENCOUNTER — Other Ambulatory Visit: Payer: Self-pay | Admitting: Gastroenterology

## 2022-10-06 ENCOUNTER — Other Ambulatory Visit: Payer: Self-pay | Admitting: Gastroenterology

## 2022-10-19 DIAGNOSIS — M25562 Pain in left knee: Secondary | ICD-10-CM | POA: Diagnosis not present

## 2022-11-01 ENCOUNTER — Encounter: Payer: Self-pay | Admitting: Family

## 2022-11-01 ENCOUNTER — Inpatient Hospital Stay: Payer: Medicare HMO | Admitting: Family

## 2022-11-01 ENCOUNTER — Other Ambulatory Visit: Payer: Self-pay | Admitting: Urology

## 2022-11-01 ENCOUNTER — Inpatient Hospital Stay: Payer: Medicare HMO | Attending: Family

## 2022-11-01 VITALS — BP 146/68 | HR 78 | Temp 97.8°F | Resp 17 | Wt 189.1 lb

## 2022-11-01 DIAGNOSIS — K589 Irritable bowel syndrome without diarrhea: Secondary | ICD-10-CM | POA: Insufficient documentation

## 2022-11-01 DIAGNOSIS — Z79899 Other long term (current) drug therapy: Secondary | ICD-10-CM | POA: Insufficient documentation

## 2022-11-01 DIAGNOSIS — D500 Iron deficiency anemia secondary to blood loss (chronic): Secondary | ICD-10-CM | POA: Insufficient documentation

## 2022-11-01 DIAGNOSIS — D508 Other iron deficiency anemias: Secondary | ICD-10-CM

## 2022-11-01 LAB — IRON AND IRON BINDING CAPACITY (CC-WL,HP ONLY)
Iron: 64 ug/dL (ref 28–170)
Saturation Ratios: 19 % (ref 10.4–31.8)
TIBC: 332 ug/dL (ref 250–450)
UIBC: 268 ug/dL (ref 148–442)

## 2022-11-01 LAB — RETICULOCYTES
Immature Retic Fract: 19.2 % — ABNORMAL HIGH (ref 2.3–15.9)
RBC.: 5.26 MIL/uL — ABNORMAL HIGH (ref 3.87–5.11)
Retic Count, Absolute: 106.3 10*3/uL (ref 19.0–186.0)
Retic Ct Pct: 2 % (ref 0.4–3.1)

## 2022-11-01 LAB — CBC WITH DIFFERENTIAL (CANCER CENTER ONLY)
Abs Immature Granulocytes: 0.15 10*3/uL — ABNORMAL HIGH (ref 0.00–0.07)
Basophils Absolute: 0.1 10*3/uL (ref 0.0–0.1)
Basophils Relative: 1 %
Eosinophils Absolute: 0.2 10*3/uL (ref 0.0–0.5)
Eosinophils Relative: 2 %
HCT: 46.8 % — ABNORMAL HIGH (ref 36.0–46.0)
Hemoglobin: 14.4 g/dL (ref 12.0–15.0)
Immature Granulocytes: 2 %
Lymphocytes Relative: 20 %
Lymphs Abs: 2.1 10*3/uL (ref 0.7–4.0)
MCH: 27.7 pg (ref 26.0–34.0)
MCHC: 30.8 g/dL (ref 30.0–36.0)
MCV: 90 fL (ref 80.0–100.0)
Monocytes Absolute: 0.7 10*3/uL (ref 0.1–1.0)
Monocytes Relative: 7 %
Neutro Abs: 7.1 10*3/uL (ref 1.7–7.7)
Neutrophils Relative %: 68 %
Platelet Count: 334 10*3/uL (ref 150–400)
RBC: 5.2 MIL/uL — ABNORMAL HIGH (ref 3.87–5.11)
RDW: 15.9 % — ABNORMAL HIGH (ref 11.5–15.5)
WBC Count: 10.3 10*3/uL (ref 4.0–10.5)
nRBC: 0 % (ref 0.0–0.2)

## 2022-11-01 LAB — FERRITIN: Ferritin: 26 ng/mL (ref 11–307)

## 2022-11-01 NOTE — Progress Notes (Signed)
Hematology and Oncology Follow Up Visit  [name] [id number] 77 y.o. 11/01/2022   Principle Diagnosis:  Iron deficiency anemia due to chronic blood loss with IBS (Crohn's vs. UC)   Current Therapy:  IV iron as indicated    Interim History:  Rhonda Bailey is here today for follow-up. She has noted increased hunger and constipation with Fusion plus. She can stop this.  She has not noted any blood loss. No recent colitis flares.  No bruising or petechiae.  She is getting out and enjoying her wonderful family.  No fever, chills, n/v, cough, rash, dizziness, SOB, chest pain, palpitations, abdominal pain or changes in bowel or bladder habits.  No swelling, tenderness, numbness or tingling in her extremities.  No falls or syncope.  Appetite and hydration are good. Weight is stable at 189 lbs.   ECOG Performance Status: 1 - Symptomatic but completely ambulatory  Medications:  Allergies as of 11/01/2022       Reactions   Tramadol Other (See Comments)   Says "blew my head up."        Medication List        Accurate as of November 01, 2022 10:51 AM. If you have any questions, ask your nurse or doctor.          acidophilus Caps capsule Take 1 capsule by mouth daily.   ALPRAZolam 0.5 MG tablet Commonly known as: XANAX Take 0.5 mg by mouth at bedtime as needed for sleep.   amLODipine 2.5 MG tablet Commonly known as: NORVASC Take 1 tablet (2.5 mg total) by mouth daily as needed (high BP). What changed: when to take this   bisacodyl 5 MG EC tablet Generic drug: bisacodyl Take 5 mg by mouth daily as needed for moderate constipation.   cholecalciferol 25 MCG (1000 UNIT) tablet Commonly known as: VITAMIN D3 Take 2,000 Units by mouth daily.   Fusion Plus Caps Take 1 capsule by mouth daily.   gabapentin 300 MG capsule Commonly known as: NEURONTIN Take 300 mg by mouth in the morning and at bedtime.   hydrocortisone 25 MG suppository Commonly known as:  ANUSOL-HC Place 1 suppository (25 mg total) rectally at bedtime. For 3-4 nights.   levothyroxine 50 MCG tablet Commonly known as: SYNTHROID Take 50 mcg by mouth every morning.   multivitamin with minerals Tabs tablet Take 1 tablet by mouth daily.   pantoprazole 40 MG tablet Commonly known as: PROTONIX Take 1 tablet (40 mg total) by mouth 2 (two) times daily before a meal. What changed: when to take this   sodium bicarbonate 650 MG tablet Take 1 tablet (650 mg total) by mouth daily.   sulfaSALAzine 500 MG tablet Commonly known as: AZULFIDINE TAKE 1 TABLET BY MOUTH TWICE A DAY   VITAMIN B-12 PO Take 2,000 mg by mouth.        Allergies:  Allergies  Allergen Reactions   Tramadol Other (See Comments)    Says "blew my head up."    Past Medical History, Surgical history, Social history, and Family History were reviewed and updated.  Review of Systems: All other 10 point review of systems is negative.   Physical Exam:  vitals were not taken for this visit.   Wt Readings from Last 3 Encounters:  07/03/22 185 lb (83.9 kg)  03/01/22 182 lb 12.8 oz (82.9 kg)  08/17/21 179 lb 1.9 oz (81.2 kg)    Ocular: Sclerae unicteric, pupils equal, round and reactive to light Ear-nose-throat: Oropharynx clear, dentition  fair Lymphatic: No cervical or supraclavicular adenopathy Lungs no rales or rhonchi, good excursion bilaterally Heart regular rate and rhythm, no murmur appreciated Abd soft, nontender, positive bowel sounds MSK no focal spinal tenderness, no joint edema Neuro: non-focal, well-oriented, appropriate affect Breasts: Deferred   Lab Results  Component Value Date   WBC 10.3 11/01/2022   HGB 14.4 11/01/2022   HCT 46.8 (H) 11/01/2022   MCV 90.0 11/01/2022   PLT 334 11/01/2022   Lab Results  Component Value Date   FERRITIN 74 07/03/2022   IRON 53 07/03/2022   TIBC 307 07/03/2022   UIBC 254 07/03/2022   IRONPCTSAT 17 07/03/2022   Lab Results  Component Value  Date   RETICCTPCT 2.0 11/01/2022   RBC 5.20 (H) 11/01/2022   RBC 5.26 (H) 11/01/2022   RETICCTABS 62,860 12/09/2019   No results found for: "KPAFRELGTCHN", "LAMBDASER", "KAPLAMBRATIO" Lab Results  Component Value Date   IGGSERUM 870 12/09/2019   No results found for: "TOTALPROTELP", "ALBUMINELP", "A1GS", "A2GS", "BETS", "BETA2SER", "GAMS", "MSPIKE", "SPEI"   Chemistry      Component Value Date/Time   NA 141 05/26/2021 1417   NA 142 03/19/2019 0000   K 4.5 05/26/2021 1417   CL 105 05/26/2021 1417   CO2 29 05/26/2021 1417   BUN 16 05/26/2021 1417   BUN 16 03/19/2019 0000   CREATININE 1.11 05/26/2021 1417   CREATININE 1.14 (H) 08/30/2020 0813   GLU 101 03/19/2019 0000      Component Value Date/Time   CALCIUM 9.4 05/26/2021 1417   ALKPHOS 86 05/26/2021 1417   AST 13 05/26/2021 1417   AST 17 08/30/2020 0813   ALT 8 05/26/2021 1417   ALT 11 08/30/2020 0813   BILITOT 0.4 05/26/2021 1417   BILITOT 0.4 08/30/2020 0813       Impression and Plan: Rhonda Bailey is a very pleasant 77 yo caucasian female with iron deficiency secondary to chronic blood loss with IBS (Crohn's vs. UC).  Iron studies pending. We will set up IV iron if needed.  Follow-up in 4 months.   Lottie Dawson, NP 3/27/202410:51 AM

## 2022-11-28 ENCOUNTER — Ambulatory Visit: Payer: Medicare HMO | Admitting: Gastroenterology

## 2022-12-07 ENCOUNTER — Other Ambulatory Visit: Payer: Self-pay | Admitting: Urology

## 2023-03-06 ENCOUNTER — Inpatient Hospital Stay: Payer: Medicare HMO | Admitting: Family

## 2023-03-06 ENCOUNTER — Inpatient Hospital Stay: Payer: Medicare HMO | Attending: Hematology & Oncology

## 2023-03-13 ENCOUNTER — Encounter: Payer: Self-pay | Admitting: Gastroenterology

## 2023-03-13 ENCOUNTER — Ambulatory Visit: Payer: Medicare HMO | Admitting: Gastroenterology

## 2023-03-13 ENCOUNTER — Other Ambulatory Visit (INDEPENDENT_AMBULATORY_CARE_PROVIDER_SITE_OTHER): Payer: Medicare HMO

## 2023-03-13 VITALS — BP 122/68 | HR 90 | Ht 67.0 in | Wt 186.0 lb

## 2023-03-13 DIAGNOSIS — K529 Noninfective gastroenteritis and colitis, unspecified: Secondary | ICD-10-CM | POA: Diagnosis not present

## 2023-03-13 DIAGNOSIS — K649 Unspecified hemorrhoids: Secondary | ICD-10-CM | POA: Diagnosis not present

## 2023-03-13 DIAGNOSIS — K639 Disease of intestine, unspecified: Secondary | ICD-10-CM

## 2023-03-13 DIAGNOSIS — K625 Hemorrhage of anus and rectum: Secondary | ICD-10-CM | POA: Diagnosis not present

## 2023-03-13 LAB — COMPREHENSIVE METABOLIC PANEL
ALT: 11 U/L (ref 0–35)
AST: 16 U/L (ref 0–37)
Albumin: 4.5 g/dL (ref 3.5–5.2)
Alkaline Phosphatase: 80 U/L (ref 39–117)
BUN: 18 mg/dL (ref 6–23)
CO2: 25 mEq/L (ref 19–32)
Calcium: 10.1 mg/dL (ref 8.4–10.5)
Chloride: 103 mEq/L (ref 96–112)
Creatinine, Ser: 1.13 mg/dL (ref 0.40–1.20)
GFR: 46.95 mL/min — ABNORMAL LOW (ref >60.00)
Glucose, Bld: 99 mg/dL (ref 70–99)
Potassium: 4.8 mEq/L (ref 3.5–5.1)
Sodium: 140 mEq/L (ref 135–145)
Total Bilirubin: 0.4 mg/dL (ref 0.2–1.2)
Total Protein: 7.9 g/dL (ref 6.0–8.3)

## 2023-03-13 LAB — CBC
HCT: 45.3 % (ref 36.0–46.0)
Hemoglobin: 14.2 g/dL (ref 12.0–15.0)
MCHC: 31.3 g/dL (ref 30.0–36.0)
MCV: 84.2 fl (ref 78.0–100.0)
Platelets: 374 10*3/uL (ref 150.0–400.0)
RBC: 5.38 Mil/uL — ABNORMAL HIGH (ref 3.87–5.11)
RDW: 18.2 % — ABNORMAL HIGH (ref 11.5–15.5)
WBC: 9.9 10*3/uL (ref 4.0–10.5)

## 2023-03-13 LAB — C-REACTIVE PROTEIN: CRP: 1.3 mg/dL (ref 0.5–20.0)

## 2023-03-13 LAB — SEDIMENTATION RATE: Sed Rate: 31 mm/hr — ABNORMAL HIGH (ref 0–30)

## 2023-03-13 MED ORDER — SULFASALAZINE 500 MG PO TABS: 500.0000 mg | ORAL_TABLET | Freq: Two times a day (BID) | ORAL | 3 refills | Status: DC

## 2023-03-13 NOTE — Progress Notes (Signed)
GASTROENTEROLOGY OUTPATIENT CLINIC VISIT   Primary Care Provider [place], [place], MD [address] [place] [address] [phone]  Patient Profile: Rhonda Bailey is a 77 y.o. female with a pmh significant for hypertension, anxiety, hypothyroidism, sleep apnea, iron deficiency anemia, nephrolithiasis, hemorrhoids (status post resection), GERD, diverticulosis (diverticular hemorrhage in 2022), IBD (most likely Crohn's colitis versus pan ulcerative colitis -recently on AZA/Humira but developed antibodies so transitioned to Lialda and then to sulfasalazine due to insurance and off AZA).  The patient presents to the [facility] for an evaluation and management of problem(s) noted below:  Problem List 1. IBD (inflammatory bowel disease)   2. Hemorrhoids, unspecified hemorrhoid type   3. Rectal bleeding     History of Present Illness Please see prior GI notes for full details of HPI.  Interval History The patient returns for follow-up.  I have not seen her since 2022.  She states that overall she is doing quite well.  She did notice that depending on the pharmacy brand of the sulfasalazine that she received she would have better days and weeks versus others.  The sulfasalazine that has the tablet with initial TV is the most effective for her.  She has dealt with some stressors in her life as a result of her daughter being diagnosed with a cancer and for which she is undergoing treatments.  But overall she is happy with her GI symptoms.  IBD diarrhea as follows: BMs per day - 2-3 times daily (usually 2 in the morning and potentially 1 in the afternoon) and these are formed Nocturnal BMs -none Blood -only notices as a result of her hemorrhoids once every couple of weeks small amount Mucous -none Tenesmus -none Urgency -none Skin Manifestations -no new issues Eye Manifestations -no new issues Joint Manifestations -no new issues  GI Review of  Systems Positive as above Negative for dysphagia, odynophagia, pyrosis, nausea, vomiting, bloating, abdominal pain, melena  Review of Systems General: Denies fevers/chills/unintentional weight loss HEENT: Denies new oral lesions  Cardiovascular: Denies chest pain Pulmonary: Denies shortness of breath Gastroenterological: See HPI Genitourinary: Denies darkened urine Hematological: Denies easy bruising Dermatological: Denies jaundice Psychological: Mood is stable   Medications Current Outpatient Medications  Medication Sig Dispense Refill   ALPRAZolam (XANAX) 0.5 MG tablet Take 0.5 mg by mouth at bedtime as needed for sleep.     amLODipine (NORVASC) 2.5 MG tablet Take 1 tablet (2.5 mg total) by mouth daily as needed (high BP). (Patient taking differently: Take 2.5 mg by mouth daily.)     cholecalciferol (VITAMIN D3) 25 MCG (1000 UNIT) tablet Take 2,000 Units by mouth daily.     diphenhydramine-acetaminophen (TYLENOL PM) 25-500 MG TABS tablet Take 1 tablet by mouth at bedtime as needed.     levothyroxine (SYNTHROID) 50 MCG tablet Take 50 mcg by mouth every morning.     Multiple Vitamin (MULTIVITAMIN WITH MINERALS) TABS tablet Take 1 tablet by mouth daily.     pantoprazole (PROTONIX) 40 MG tablet Take 1 tablet (40 mg total) by mouth 2 (two) times daily before a meal. (Patient taking differently: Take 40 mg by mouth daily.) 60 tablet 1   sodium bicarbonate 650 MG tablet Take 1 tablet (650 mg total) by mouth daily. (Patient taking differently: Take 650 mg by mouth daily as needed.) 30 tablet 0   bisacodyl 5 MG EC tablet Take 5 mg by mouth daily as needed for moderate constipation. (Patient not taking: Reported on 03/13/2023)  sulfaSALAzine (AZULFIDINE) 500 MG tablet Take 1 tablet (500 mg total) by mouth 2 (two) times daily. 180 tablet 3   No current facility-administered medications for this visit.    Allergies Allergies  Allergen Reactions   Tramadol Other (See Comments)    Says  "blew my head up."    Histories Past Medical History:  Diagnosis Date   Anal fissure    Anxiety    Anxiety disorder, unspecified    Essential (primary) hypertension    Gastro-esophageal reflux disease with esophagitis    GERD (gastroesophageal reflux disease)    Hypokalemia    Hypothyroidism    Kidney stone    Noninfective gastroenteritis and colitis, unspecified    Pinched vertebral nerve    back   Sleep apnea    Past Surgical History:  Procedure Laterality Date   BIOPSY  09/02/2018   Procedure: BIOPSY;  Surgeon: Corbin Ade, MD;  Location: [facility];  Service: Endoscopy;;  colon    CHOLECYSTECTOMY  05/19/2011   Procedure: LAPAROSCOPIC CHOLECYSTECTOMY;  Surgeon: Dalia Heading;  Location: [facility];  Service: General;  Laterality: N/A;   COLONOSCOPY  11/2019   Dr. Meridee Score:  rectal ulceration, TI and IC valve appeared normal, 8 mm polyp in proximal ascending colon that was sessile. Inflammation in transverse colon to cecum, inflammation with altered vascularity, edema, erosions, erythema, friability, shallow ulcerations in a continuous and circumferential pattern from rectum to splenix flexure, single twenty mm ulcer in distal rectum. Small and large-   COLONOSCOPY WITH PROPOFOL N/A 09/02/2018   Dr. Jena Gauss: Proctocolitis limited to the left side with some exudate versus partial pseudomembrane formation, erosions and ulceration somewhat superficial.  Terminal ileum normal.  Diverticulosis.  Descending/sigmoid colon biopsy showing severe active colitis, diagnostic features of IBD not identified.  Differential includes infection, ischemia.  Rectal biopsies with granulation tissue c/w ulcer   CYSTOSCOPY W/ URETERAL STENT PLACEMENT Right 05/08/2019   Procedure: CYSTOSCOPY WITH RETROGRADE PYELOGRAM/URETERAL STENT PLACEMENT;  Surgeon: Jerilee Field, MD;  Location: [facility];  Service: Urology;  Laterality: Right;   CYSTOSCOPY W/ URETERAL STENT PLACEMENT Left 05/17/2020   Procedure:  CYSTOSCOPY WITH RETROGRADE PYELOGRAM/URETERAL STENT PLACEMENT;  Surgeon: Noel Christmas, MD;  Location: [facility];  Service: Urology;  Laterality: Left;   CYSTOSCOPY WITH RETROGRADE PYELOGRAM, URETEROSCOPY AND STENT PLACEMENT Left 06/23/2020   Procedure: CYSTOSCOPY WITH LEFT RETROGRADE PYELOGRAM, LEFT URETEROSCOPY AND STENT EXCHANGE;  Surgeon: Malen Gauze, MD;  Location: [facility];  Service: Urology;  Laterality: Left;   CYSTOSCOPY WITH URETEROSCOPY Right 06/11/2019   Procedure: CYSTOSCOPY WITH DIAGNOSTIC URETEROSCOPY; URETERAL STENT REMOVAL;  Surgeon: Malen Gauze, MD;  Location: [facility];  Service: Urology;  Laterality: Right;   HEMORRHOID SURGERY N/A 09/16/2019   Procedure: EXTENSIVE HEMORRHOIDECTOMY;  Surgeon: Franky Macho, MD;  Location: [facility];  Service: General;  Laterality: N/A;   RECTAL BIOPSY N/A 09/16/2019   Procedure: BIOPSY RECTAL;  Surgeon: Franky Macho, MD;  Location: [facility];  Service: General;  Laterality: N/A;   STONE EXTRACTION WITH BASKET  06/23/2020   Procedure: LEFT URETERAL STONE EXTRACTION WITH BASKET;  Surgeon: Malen Gauze, MD;  Location: [facility];  Service: Urology;;   TUBAL LIGATION     Social History   Socioeconomic History   Marital status: Divorced    Spouse name: Not on file   Number of children: 2   Years of education: Not on file   Highest education level: Not on file  Occupational History   Occupation: retired  Tobacco Use   Smoking status: Never   Smokeless tobacco: Never  Vaping Use   Vaping status: Never Used  Substance and Sexual Activity   Alcohol use: Not Currently    Comment: once a year   Drug use: No   Sexual activity: Not Currently  Other Topics Concern   Not on file  Social History Narrative   Not on file   Social Determinants of Health   Financial Resource Strain: Low Risk  (05/08/2019)   Overall Financial Resource Strain (CARDIA)    Difficulty of Paying Living Expenses: Not hard at all  Food Insecurity: No Food  Insecurity (05/08/2019)   Hunger Vital Sign    Worried About Running Out of Food in the Last Year: Never true    Ran Out of Food in the Last Year: Never true  Transportation Needs: No Transportation Needs (05/08/2019)   PRAPARE - Administrator, Civil Service (Medical): No    Lack of Transportation (Non-Medical): No  Physical Activity: Sufficiently Active (05/08/2019)   Exercise Vital Sign    Days of Exercise per Week: 5 days    Minutes of Exercise per Session: 30 min  Stress: No Stress Concern Present (05/08/2019)   [employer] of Occupational Health - Occupational Stress Questionnaire    Feeling of Stress : Not at all  Social Connections: Socially Isolated (05/08/2019)   Social Connection and Isolation Panel [NHANES]    Frequency of Communication with Friends and Family: More than three times a week    Frequency of Social Gatherings with Friends and Family: More than three times a week    Attends Religious Services: Never    Database administrator or Organizations: No    Attends Banker Meetings: Never    Marital Status: Divorced  Catering manager Violence: Not At Risk (05/08/2019)   Humiliation, Afraid, Rape, and Kick questionnaire    Fear of Current or Ex-Partner: No    Emotionally Abused: No    Physically Abused: No    Sexually Abused: No   Family History  Problem Relation Age of Onset   Cancer Father        mouth   Heart disease Sister    Heart disease Brother    Cancer Brother        head   Cancer Daughter        Leiomyosarcoma   Anesthesia problems Neg Hx    Hypotension Neg Hx    Malignant hyperthermia Neg Hx    Pseudochol deficiency Neg Hx    Colon cancer Neg Hx    Esophageal cancer Neg Hx    Inflammatory bowel disease Neg Hx    Liver disease Neg Hx    Pancreatic cancer Neg Hx    Rectal cancer Neg Hx    Stomach cancer Neg Hx    I have reviewed her medical, social, and family history in detail and updated the electronic medical  record as necessary.    PHYSICAL EXAMINATION  BP 122/68   Pulse 90   Ht 5\' 7"  (1.702 m)   Wt 186 lb (84.4 kg)   SpO2 95%   BMI 29.13 kg/m  Wt Readings from Last 3 Encounters:  03/13/23 186 lb (84.4 kg)  11/01/22 189 lb 1.9 oz (85.8 kg)  07/03/22 185 lb (83.9 kg)  GEN: No acute distress, nontoxic appearing, accompanied by her other daughter PSYCH: Cooperative, without pressured speech EYE: Conjunctivae pink, sclerae anicteric ENT: MMM, without oral ulcers CV: Nontachycardic  RESP: No audible wheezing GI: NABS, soft, mildly protuberant, nontender, no rebound MSK/EXT: No significant lower extremity edema SKIN: No jaundice NEURO:  Alert & Oriented x 3, no focal deficits   REVIEW OF DATA  I reviewed the following data at the time of this encounter:  GI Procedures and Studies  Previously reviewed  Laboratory Studies  Reviewed those in epic  Imaging Studies  No new imaging studies to review   ASSESSMENT  Ms. Scheurer is a 76 y.o. female with a pmh significant for hypertension, anxiety, hypothyroidism, sleep apnea, iron deficiency anemia, nephrolithiasis, hemorrhoids (status post resection), GERD, diverticulosis (diverticular hemorrhage in 2022), IBD (most likely Crohn's colitis versus pan ulcerative colitis -recently on AZA/Humira but developed antibodies so transitioned to Lialda and then to sulfasalazine due to insurance and off AZA).  The patient is seen today for evaluation and management of:  1. IBD (inflammatory bowel disease)   2. Hemorrhoids, unspecified hemorrhoid type   3. Rectal bleeding    The patient is hemodynamically and clinically stable from a GI perspective.  She continues to do well just on sulfasalazine therapy.  We had a frank discussion today that it seems like she is in clinical remission but we have not had an endoscopic/mucosal evaluation in quite a few years.  I think it would make sense for [place] to repeat these to ensure that she does not have active  disease but she is very hesitant about this.  Thankfully her previous iron deficiency has improved which goes along with her colon likely being in better shape.  We did finally agree upon a fecal calprotectin being performed and if this were to retain elevated we would be in more alignment of considering colonoscopy that she may agree upon.  At the end of the day she also has the opportunity to decide her own care and as long as she is doing and feeling well and were not seeing other markers that are concerning she can certainly defer on colonoscopy if she desires.  Will see what the fecal calprotectin shows and get some updated laboratories.  Will get a give her Anusol suppositories if she needs them as needed for hemorrhoidal bleeding if it becomes an issue.  Will see her back within a year as long as she is continuing to do well with a normal fecal calprotectin, sooner if needed.  All patient questions were answered to the best of my ability, and the patient agrees to the aforementioned plan of action with follow-up as indicated.   PLAN  Laboratories as outlined below Fecal calprotectin to be obtained Continue sulfasalazine 1000 mg daily (may be able to increase up to 2000 mg daily if patient tolerates or as necessary) Patient defers on repeat colonoscopic evaluation at this time Anusol suppositories nightly x 3 nights if significant hemorrhoidal bleeding Continue stool softeners daily Consider fiber daily   Orders Placed This Encounter  Procedures   Calprotectin, Fecal   CBC   Comp Met (CMET)   Sedimentation rate   C-reactive protein     New Prescriptions   No medications on file   Modified Medications   Modified Medication Previous Medication   SULFASALAZINE (AZULFIDINE) 500 MG TABLET sulfaSALAzine (AZULFIDINE) 500 MG tablet      Take 1 tablet (500 mg total) by mouth 2 (two) times daily.    TAKE 1 TABLET BY MOUTH TWICE A DAY    Planned Follow Up Return in about 1 year (around  03/12/2024).   Total Time in  Face-to-Face and in Coordination of Care for patient including independent/personal interpretation/review of prior testing, medical history, examination, medication adjustment, communicating results with the patient directly, and documentation with the EHR is more than 25 minutes.   Corliss Parish, MD [facility] Office # [phone]

## 2023-03-13 NOTE — Patient Instructions (Addendum)
Your provider has requested that you go to the basement level for lab work before leaving today. Press "B" on the elevator. The lab is located at the first door on the left as you exit the elevator.   We have sent the following medications to your pharmacy for you to pick up at your convenience: Sulfasalazine   Follow up in 1 year. Sooner if needed.   _______________________________________________________  If your blood pressure at your visit was 140/90 or greater, please contact your primary care physician to follow up on this.  _______________________________________________________  If you are age 59 or older, your body mass index should be between 23-30. Your Body mass index is 29.13 kg/m. If this is out of the aforementioned range listed, please consider follow up with your Primary Care Provider.  If you are age 56 or younger, your body mass index should be between 19-25. Your Body mass index is 29.13 kg/m. If this is out of the aformentioned range listed, please consider follow up with your Primary Care Provider.   ________________________________________________________  The [facility] providers would like to encourage you to use [facility] to communicate with providers for non-urgent requests or questions.  Due to long hold times on the telephone, sending your provider a message by [facility] may be a faster and more efficient way to get a response.  Please allow 48 business hours for a response.  Please remember that this is for non-urgent requests.  _______________________________________________________  Due to recent changes in healthcare laws, you may see the results of your imaging and laboratory studies on MyChart before your provider has had a chance to review them.  We understand that in some cases there may be results that are confusing or concerning to you. Not all laboratory results come back in the same time frame and the provider may be waiting for multiple results in order  to interpret others.  Please give [place] 48 hours in order for your provider to thoroughly review all the results before contacting the office for clarification of your results.   Thank you for choosing me and [facility].  Dr. Meridee Score

## 2023-03-21 ENCOUNTER — Ambulatory Visit: Payer: Medicare HMO

## 2023-03-21 DIAGNOSIS — K529 Noninfective gastroenteritis and colitis, unspecified: Secondary | ICD-10-CM

## 2023-03-22 DIAGNOSIS — N1831 Chronic kidney disease, stage 3a: Secondary | ICD-10-CM | POA: Diagnosis not present

## 2023-03-22 DIAGNOSIS — K5090 Crohn's disease, unspecified, without complications: Secondary | ICD-10-CM | POA: Diagnosis not present

## 2023-03-22 DIAGNOSIS — E039 Hypothyroidism, unspecified: Secondary | ICD-10-CM | POA: Diagnosis not present

## 2023-03-22 DIAGNOSIS — I10 Essential (primary) hypertension: Secondary | ICD-10-CM | POA: Diagnosis not present

## 2023-03-25 LAB — CALPROTECTIN, FECAL: Calprotectin, Fecal: 995 ug/g — ABNORMAL HIGH (ref 0–120)

## 2023-03-27 ENCOUNTER — Other Ambulatory Visit: Payer: Self-pay

## 2023-03-27 DIAGNOSIS — K529 Noninfective gastroenteritis and colitis, unspecified: Secondary | ICD-10-CM

## 2023-03-27 DIAGNOSIS — R195 Other fecal abnormalities: Secondary | ICD-10-CM

## 2023-06-29 ENCOUNTER — Other Ambulatory Visit: Payer: Self-pay | Admitting: Gastroenterology

## 2023-10-18 DIAGNOSIS — N1831 Chronic kidney disease, stage 3a: Secondary | ICD-10-CM | POA: Diagnosis not present

## 2023-10-18 DIAGNOSIS — E039 Hypothyroidism, unspecified: Secondary | ICD-10-CM | POA: Diagnosis not present

## 2023-10-18 DIAGNOSIS — I10 Essential (primary) hypertension: Secondary | ICD-10-CM | POA: Diagnosis not present

## 2023-10-18 DIAGNOSIS — Z79899 Other long term (current) drug therapy: Secondary | ICD-10-CM | POA: Diagnosis not present

## 2023-10-18 DIAGNOSIS — E559 Vitamin D deficiency, unspecified: Secondary | ICD-10-CM | POA: Diagnosis not present

## 2023-10-18 DIAGNOSIS — E7800 Pure hypercholesterolemia, unspecified: Secondary | ICD-10-CM | POA: Diagnosis not present

## 2023-10-18 DIAGNOSIS — Z0000 Encounter for general adult medical examination without abnormal findings: Secondary | ICD-10-CM | POA: Diagnosis not present

## 2023-10-18 DIAGNOSIS — K50918 Crohn's disease, unspecified, with other complication: Secondary | ICD-10-CM | POA: Diagnosis not present

## 2023-11-08 DIAGNOSIS — M25562 Pain in left knee: Secondary | ICD-10-CM | POA: Diagnosis not present

## 2023-11-29 DIAGNOSIS — M25562 Pain in left knee: Secondary | ICD-10-CM | POA: Diagnosis not present

## 2024-01-22 DIAGNOSIS — N1831 Chronic kidney disease, stage 3a: Secondary | ICD-10-CM | POA: Diagnosis not present

## 2024-01-22 DIAGNOSIS — I10 Essential (primary) hypertension: Secondary | ICD-10-CM | POA: Diagnosis not present

## 2024-02-04 DIAGNOSIS — I10 Essential (primary) hypertension: Secondary | ICD-10-CM | POA: Diagnosis not present

## 2024-02-04 DIAGNOSIS — E7800 Pure hypercholesterolemia, unspecified: Secondary | ICD-10-CM | POA: Diagnosis not present

## 2024-02-04 DIAGNOSIS — N1831 Chronic kidney disease, stage 3a: Secondary | ICD-10-CM | POA: Diagnosis not present

## 2024-02-04 DIAGNOSIS — E039 Hypothyroidism, unspecified: Secondary | ICD-10-CM | POA: Diagnosis not present

## 2024-02-27 DIAGNOSIS — I10 Essential (primary) hypertension: Secondary | ICD-10-CM | POA: Diagnosis not present

## 2024-02-27 DIAGNOSIS — N1831 Chronic kidney disease, stage 3a: Secondary | ICD-10-CM | POA: Diagnosis not present

## 2024-03-06 DIAGNOSIS — E039 Hypothyroidism, unspecified: Secondary | ICD-10-CM | POA: Diagnosis not present

## 2024-03-06 DIAGNOSIS — N1831 Chronic kidney disease, stage 3a: Secondary | ICD-10-CM | POA: Diagnosis not present

## 2024-03-06 DIAGNOSIS — I10 Essential (primary) hypertension: Secondary | ICD-10-CM | POA: Diagnosis not present

## 2024-03-06 DIAGNOSIS — E7800 Pure hypercholesterolemia, unspecified: Secondary | ICD-10-CM | POA: Diagnosis not present

## 2024-03-27 DIAGNOSIS — J189 Pneumonia, unspecified organism: Secondary | ICD-10-CM | POA: Diagnosis not present

## 2024-03-27 DIAGNOSIS — R051 Acute cough: Secondary | ICD-10-CM | POA: Diagnosis not present

## 2024-03-28 DIAGNOSIS — I10 Essential (primary) hypertension: Secondary | ICD-10-CM | POA: Diagnosis not present

## 2024-03-28 DIAGNOSIS — N1831 Chronic kidney disease, stage 3a: Secondary | ICD-10-CM | POA: Diagnosis not present

## 2024-03-29 ENCOUNTER — Other Ambulatory Visit: Payer: Self-pay | Admitting: Gastroenterology

## 2024-04-06 DIAGNOSIS — I10 Essential (primary) hypertension: Secondary | ICD-10-CM | POA: Diagnosis not present

## 2024-04-06 DIAGNOSIS — N1831 Chronic kidney disease, stage 3a: Secondary | ICD-10-CM | POA: Diagnosis not present

## 2024-04-06 DIAGNOSIS — E7800 Pure hypercholesterolemia, unspecified: Secondary | ICD-10-CM | POA: Diagnosis not present

## 2024-04-06 DIAGNOSIS — E039 Hypothyroidism, unspecified: Secondary | ICD-10-CM | POA: Diagnosis not present

## 2024-04-08 DIAGNOSIS — E039 Hypothyroidism, unspecified: Secondary | ICD-10-CM | POA: Diagnosis not present

## 2024-04-08 DIAGNOSIS — K921 Melena: Secondary | ICD-10-CM | POA: Diagnosis not present

## 2024-04-08 DIAGNOSIS — I10 Essential (primary) hypertension: Secondary | ICD-10-CM | POA: Diagnosis not present

## 2024-04-08 DIAGNOSIS — J209 Acute bronchitis, unspecified: Secondary | ICD-10-CM | POA: Diagnosis not present

## 2024-04-08 DIAGNOSIS — K50918 Crohn's disease, unspecified, with other complication: Secondary | ICD-10-CM | POA: Diagnosis not present

## 2024-04-09 ENCOUNTER — Telehealth: Payer: Self-pay | Admitting: Gastroenterology

## 2024-04-09 NOTE — Telephone Encounter (Signed)
 I spoke with the pt and advised that she should call her PCP or hematology to discuss the low platelets.  She states that she has called and waiting on an appt now.  She will let us  know if there are any other questions or concerns.

## 2024-04-09 NOTE — Telephone Encounter (Signed)
 Inbound call from pt and she stated that she reached out to her PCP and was told to contact our office due to her platelets being down a 9.8. Patient is requesting a call back. Please advise.

## 2024-04-09 NOTE — Telephone Encounter (Signed)
 I spoke with the PCP and they advised she needs to see Dr. Wilhelmenia. He is not available until November and they want her seen by next week. Please advise.

## 2024-04-10 NOTE — Telephone Encounter (Signed)
 Dr Wilhelmenia do you want to see the pt for low platelets?

## 2024-04-11 ENCOUNTER — Other Ambulatory Visit

## 2024-04-11 ENCOUNTER — Ambulatory Visit: Payer: Self-pay | Admitting: Gastroenterology

## 2024-04-11 ENCOUNTER — Other Ambulatory Visit: Payer: Self-pay

## 2024-04-11 DIAGNOSIS — D649 Anemia, unspecified: Secondary | ICD-10-CM | POA: Diagnosis not present

## 2024-04-11 DIAGNOSIS — K625 Hemorrhage of anus and rectum: Secondary | ICD-10-CM | POA: Diagnosis not present

## 2024-04-11 DIAGNOSIS — K921 Melena: Secondary | ICD-10-CM | POA: Diagnosis not present

## 2024-04-11 LAB — CBC WITH DIFFERENTIAL/PLATELET
Basophils Absolute: 0.1 K/uL (ref 0.0–0.1)
Basophils Relative: 0.9 % (ref 0.0–3.0)
Eosinophils Absolute: 0.3 K/uL (ref 0.0–0.7)
Eosinophils Relative: 2.9 % (ref 0.0–5.0)
HCT: 30.7 % — ABNORMAL LOW (ref 36.0–46.0)
Hemoglobin: 9.8 g/dL — ABNORMAL LOW (ref 12.0–15.0)
Lymphocytes Relative: 21.4 % (ref 12.0–46.0)
Lymphs Abs: 2 K/uL (ref 0.7–4.0)
MCHC: 31.9 g/dL (ref 30.0–36.0)
MCV: 77.6 fl — ABNORMAL LOW (ref 78.0–100.0)
Monocytes Absolute: 0.7 K/uL (ref 0.1–1.0)
Monocytes Relative: 7.4 % (ref 3.0–12.0)
Neutro Abs: 6.2 K/uL (ref 1.4–7.7)
Neutrophils Relative %: 67.4 % (ref 43.0–77.0)
Platelets: 472 K/uL — ABNORMAL HIGH (ref 150.0–400.0)
RBC: 3.96 Mil/uL (ref 3.87–5.11)
RDW: 18.4 % — ABNORMAL HIGH (ref 11.5–15.5)
WBC: 9.1 K/uL (ref 4.0–10.5)

## 2024-04-11 LAB — HIGH SENSITIVITY CRP: CRP, High Sensitivity: 8.26 mg/L — ABNORMAL HIGH (ref 0.000–5.000)

## 2024-04-11 LAB — IBC + FERRITIN
Ferritin: 15.6 ng/mL (ref 10.0–291.0)
Iron: 6 ug/dL — ABNORMAL LOW (ref 42–145)
Saturation Ratios: 1.6 % — ABNORMAL LOW (ref 20.0–50.0)
TIBC: 373.8 ug/dL (ref 250.0–450.0)
Transferrin: 267 mg/dL (ref 212.0–360.0)

## 2024-04-11 LAB — SEDIMENTATION RATE: Sed Rate: 29 mm/h (ref 0–30)

## 2024-04-11 LAB — FOLATE: Folate: 15.3 ng/mL (ref >5.9)

## 2024-04-11 LAB — VITAMIN B12: Vitamin B-12: 677 pg/mL (ref 211–911)

## 2024-04-11 MED ORDER — NA SULFATE-K SULFATE-MG SULF 17.5-3.13-1.6 GM/177ML PO SOLN: 1.0000 | Freq: Once | ORAL | 0 refills | Status: AC

## 2024-04-11 MED ORDER — FERROUS SULFATE 325 (65 FE) MG PO TBEC: 325.0000 mg | DELAYED_RELEASE_TABLET | Freq: Every day | ORAL | 3 refills | Status: AC

## 2024-04-11 NOTE — Telephone Encounter (Signed)
 Do we even know the actual CBC. I can't see one in the chart. GM

## 2024-04-11 NOTE — Telephone Encounter (Signed)
 Thank you Patty for helping.

## 2024-04-11 NOTE — Telephone Encounter (Signed)
 The pt has been advised that Dr Wilhelmenia would like the pt to have labs, office appt, and endo colon.  Appt made for office visit on 04/14/24 at 1020 am with Camie, endo colon set up for 05/13/24, labs have been entered as STAT- prep sent and pt aware to pick up within a week.  She does confirm that she has  no GI complaints but has had dark stools for some time.  NO active bleeding. She will come in this afternoon for labs.

## 2024-04-11 NOTE — Telephone Encounter (Signed)
 Patty, I was able to pull up the following, it has nothing to do with PLTs, so not sure how this disconnect occurred.       CBC with Diff Reviewed date:04/09/2024 02:55:10 PM Interpretation: Performing Lab: Notes/Report: Testing Performed at: [facility], [address], [address], [place], [place] [address]  WBC 7.6 4.0-11.0 K/ul    RBC 3.91 4.20-5.40 M/uL    HGB 9.8 12.0-16.0 g/dL    HCT 69.7 62.9-52.9 %    MCV 77.3 81.0-99.0 fL    MCH 25.1 27.0-33.0 pg    MCHC 32.5 32.0-36.0 g/dL    RDW 80.5 88.4-84.4 %    PLT 386 150-400 K/uL     Please find out if she is having GI symptoms or rectal bleeding. Patient should be scheduled for next available APP/Urgent visit or use a held slot or 350PM slot for this patient to see me. Have her come in today and and do a repeat CBC/Iron /TIBC/Ferritin/B12/Folate level/ESR/CRP and have her do a Fecal Calprotectin test ASAP. Schedule her next available EGD/Colonoscopy with me (I see an opening on 10/7) - I would scoot her in as a double even though it is a single slot for now. If inflammatory markers are elevated we will likely plan steroids until procedure is completed. Thanks. GM

## 2024-04-11 NOTE — Progress Notes (Signed)
 Prescriptions have been sent to the pharmacy Referral to hematology made for IV iron  infusions   Left message on machine to call back

## 2024-04-13 NOTE — Progress Notes (Unsigned)
 Rhonda Bailey [id number] [birth year]   Chief Complaint:  Referring Provider: Regino Slater, MD Primary GI MD: Dr. Wilhelmenia  HPI: Rhonda Bailey is a 78 y.o. female with past medical history of HTN, anxiety, hypothyroidism, sleep apnea, IDA, nephrolithiasis, hemorrhoids s/p resection, GERD, diverticulosis with history of hemorrhage in 2022, IBD (most likely Crohn's colitis versus pan ulcerative colitis -recently on AZA/Humira  but developed antibodies so transitioned to Lialda  and then to sulfasalazine  due to insurance and off AZA) who presents today for a complaint of *** .    Patient last seen in office 03/13/2023 by Dr. Wilhelmenia at which time she was hemodynamically and clinically stable from GI perspective and doing well on sulfasalazine  therapy.  It was discussed that though she was in clinical remission she had not had repeat endoscopic/mucosal evaluation in a few years and was advised it would make sense to repeat these but she was hesitant.  Was given Anusol  suppositories for use as needed for hemorrhoidal bleeding.  Advised to continue daily stool softeners, fiber, and sulfasalazine  at 1000 mg daily with increase up to 2000 mg daily if necessary.  Fecal calprotectin was ordered and was elevated to 995.  Colonoscopy was strongly recommended but patient declined.  Patient called 04/09/2024 reporting that she was advised by PCP to contact the office due to abnormal lab findings.  These were done at [facility], had hemoglobin of 9.8.  Labs 04/11/2024: Hemoglobin 9.8, hematocrit 30.7, MCV 77.6, platelets 472, CRP 8.26, sed rate 29, folate 15.3, vitamin B12 677, iron  6, ferritin 15.6  She was advised to start back on ferrous sulfate  325 mg once daily, and referred to hematology for iron  infusions.  Repeat fecal calprotectin has been collected but results not in.  She is scheduled for EGD/colonoscopy on 05/13/2024.   Previous GI Procedures/Imaging   Colonoscopy 12/04/2019 - Rectal tenderness,  hemorrhoids and rectal stricture found on digital rectal exam.  - The examined portion of the ileum was normal. Biopsied.  - 8 mm polyp was found in the proximal ascending colon. The polyp was sessile. The polyp was removed with a cold snare. Resection and retrieval were complete.  - Colitis. Inflammation was found in the transverse colon, in the hepatic flexure, in the ascending colon and at the cecum. This was graded as Mayo Score 1 (mild disease) and graded as Mayo Score 2 (moderate disease) . Biopsied.  - Colitis. Inflammation was found from the rectum to the splenic flexure. This was graded as Mayo Score 3 (severe disease) . Biopsied. - A single (solitary) ulcer in the distal rectum. Biopsied.  - Diverticulosis in the entire examined colon.  - Non- bleeding non- thrombosed external and internal hemorrhoids.  Past Medical History:  Diagnosis Date   Anal fissure    Anxiety    Anxiety disorder, unspecified    Essential (primary) hypertension    Gastro-esophageal reflux disease with esophagitis    GERD (gastroesophageal reflux disease)    Hypokalemia    Hypothyroidism    Kidney stone    Noninfective gastroenteritis and colitis, unspecified    Pinched vertebral nerve    back   Sleep apnea     Past Surgical History:  Procedure Laterality Date   BIOPSY  09/02/2018   Procedure: BIOPSY;  Surgeon: Shaaron Lamar CHRISTELLA, MD;  Location: [facility];  Service: Endoscopy;;  colon    CHOLECYSTECTOMY  05/19/2011   Procedure: LAPAROSCOPIC CHOLECYSTECTOMY;  Surgeon: Oneil DELENA Budge;  Location: [facility];  Service: General;  Laterality: N/A;  COLONOSCOPY  11/2019   Dr. Wilhelmenia:  rectal ulceration, TI and IC valve appeared normal, 8 mm polyp in proximal ascending colon that was sessile. Inflammation in transverse colon to cecum, inflammation with altered vascularity, edema, erosions, erythema, friability, shallow ulcerations in a continuous and circumferential pattern from rectum to splenix flexure,  single twenty mm ulcer in distal rectum. Small and large-   COLONOSCOPY WITH PROPOFOL  N/A 09/02/2018   Dr. Shaaron: Proctocolitis limited to the left side with some exudate versus partial pseudomembrane formation, erosions and ulceration somewhat superficial.  Terminal ileum normal.  Diverticulosis.  Descending/sigmoid colon biopsy showing severe active colitis, diagnostic features of IBD not identified.  Differential includes infection, ischemia.  Rectal biopsies with granulation tissue c/w ulcer   CYSTOSCOPY W/ URETERAL STENT PLACEMENT Right 05/08/2019   Procedure: CYSTOSCOPY WITH RETROGRADE PYELOGRAM/URETERAL STENT PLACEMENT;  Surgeon: Nieves Cough, MD;  Location: [facility];  Service: Urology;  Laterality: Right;   CYSTOSCOPY W/ URETERAL STENT PLACEMENT Left 05/17/2020   Procedure: CYSTOSCOPY WITH RETROGRADE PYELOGRAM/URETERAL STENT PLACEMENT;  Surgeon: Elisabeth Valli BIRCH, MD;  Location: [facility];  Service: Urology;  Laterality: Left;   CYSTOSCOPY WITH RETROGRADE PYELOGRAM, URETEROSCOPY AND STENT PLACEMENT Left 06/23/2020   Procedure: CYSTOSCOPY WITH LEFT RETROGRADE PYELOGRAM, LEFT URETEROSCOPY AND STENT EXCHANGE;  Surgeon: Sherrilee Belvie CROME, MD;  Location: [facility];  Service: Urology;  Laterality: Left;   CYSTOSCOPY WITH URETEROSCOPY Right 06/11/2019   Procedure: CYSTOSCOPY WITH DIAGNOSTIC URETEROSCOPY; URETERAL STENT REMOVAL;  Surgeon: Sherrilee Belvie CROME, MD;  Location: [facility];  Service: Urology;  Laterality: Right;   HEMORRHOID SURGERY N/A 09/16/2019   Procedure: EXTENSIVE HEMORRHOIDECTOMY;  Surgeon: Mavis Anes, MD;  Location: [facility];  Service: General;  Laterality: N/A;   RECTAL BIOPSY N/A 09/16/2019   Procedure: BIOPSY RECTAL;  Surgeon: Mavis Anes, MD;  Location: [facility];  Service: General;  Laterality: N/A;   STONE EXTRACTION WITH BASKET  06/23/2020   Procedure: LEFT URETERAL STONE EXTRACTION WITH BASKET;  Surgeon: Sherrilee Belvie CROME, MD;  Location: [facility];  Service: Urology;;   TUBAL LIGATION       Current Outpatient Medications  Medication Sig Dispense Refill   ferrous sulfate  325 (65 FE) MG EC tablet Take 1 tablet (325 mg total) by mouth daily with breakfast. 30 tablet 3   ALPRAZolam  (XANAX ) 0.5 MG tablet Take 0.5 mg by mouth at bedtime as needed for sleep.     amLODipine  (NORVASC ) 2.5 MG tablet Take 1 tablet (2.5 mg total) by mouth daily as needed (high BP). (Patient taking differently: Take 2.5 mg by mouth daily.)     bisacodyl  5 MG EC tablet Take 5 mg by mouth daily as needed for moderate constipation. (Patient not taking: Reported on 03/13/2023)     cholecalciferol  (VITAMIN D3) 25 MCG (1000 UNIT) tablet Take 2,000 Units by mouth daily.     diphenhydramine-acetaminophen  (TYLENOL  PM) 25-500 MG TABS tablet Take 1 tablet by mouth at bedtime as needed.     levothyroxine  (SYNTHROID ) 50 MCG tablet Take 50 mcg by mouth every morning.     Multiple Vitamin (MULTIVITAMIN WITH MINERALS) TABS tablet Take 1 tablet by mouth daily.     pantoprazole  (PROTONIX ) 40 MG tablet Take 1 tablet (40 mg total) by mouth 2 (two) times daily before a meal. (Patient taking differently: Take 40 mg by mouth daily.) 60 tablet 1   sodium bicarbonate  650 MG tablet Take 1 tablet (650 mg total) by mouth daily. (Patient taking differently: Take 650 mg by mouth daily as needed.)  30 tablet 0   sulfaSALAzine  (AZULFIDINE ) 500 MG tablet TAKE 1 TABLET BY MOUTH TWICE A DAY 180 tablet 2   No current facility-administered medications for this visit.    Allergies as of 04/14/2024 - Review Complete 03/13/2023  Allergen Reaction Noted   Tramadol  Other (See Comments) 05/08/2019    Family History  Problem Relation Age of Onset   Cancer Father        mouth   Heart disease Sister    Heart disease Brother    Cancer Brother        head   Cancer Daughter        Leiomyosarcoma   Anesthesia problems Neg Hx    Hypotension Neg Hx    Malignant hyperthermia Neg Hx    Pseudochol deficiency Neg Hx    Colon cancer Neg Hx     Esophageal cancer Neg Hx    Inflammatory bowel disease Neg Hx    Liver disease Neg Hx    Pancreatic cancer Neg Hx    Rectal cancer Neg Hx    Stomach cancer Neg Hx     Social History   Tobacco Use   Smoking status: Never   Smokeless tobacco: Never  Vaping Use   Vaping status: Never Used  Substance Use Topics   Alcohol use: Not Currently    Comment: once a year   Drug use: No     Review of Systems:    Constitutional: No weight loss, fever, chills, weakness or fatigue Eyes: No change in vision Ears, Nose, Throat:  No change in hearing or congestion Skin: No rash or itching Cardiovascular: No chest pain, chest pressure or palpitations   Respiratory: No SOB or cough Gastrointestinal: See HPI and otherwise negative Genitourinary: No dysuria or change in urinary frequency Neurological: No headache, dizziness or syncope Musculoskeletal: No new muscle or joint pain Hematologic: No bleeding or bruising    Physical Exam:  Vital signs: There were no vitals taken for this visit.  Constitutional: NAD, Well developed, Well nourished, alert and cooperative Head:  Normocephalic and atraumatic.  Eyes: No scleral icterus. Conjunctiva pink. Mouth: No oral lesions. Respiratory: Respirations even and unlabored. Lungs clear to auscultation bilaterally.  No wheezes, crackles, or rhonchi.  Cardiovascular:  Regular rate and rhythm. No murmurs. No peripheral edema. Gastrointestinal:  Soft, nondistended, nontender. No rebound or guarding. Normal bowel sounds. No appreciable masses or hepatomegaly. Rectal:  Not performed.  Neurologic:  Alert and oriented x4;  grossly normal neurologically.  Skin:   Dry and intact without significant lesions or rashes. Psychiatric: Oriented to person, place and time. Demonstrates good judgement and reason without abnormal affect or behaviors.   RELEVANT LABS AND IMAGING: CBC    Component Value Date/Time   WBC 9.1 04/11/2024 1121   RBC 3.96 04/11/2024 1121    HGB 9.8 (L) 04/11/2024 1121   HGB 14.4 11/01/2022 1014   HCT 30.7 (L) 04/11/2024 1121   PLT 472.0 (H) 04/11/2024 1121   PLT 334 11/01/2022 1014   MCV 77.6 (L) 04/11/2024 1121   MCH 27.7 11/01/2022 1014   MCHC 31.9 04/11/2024 1121   RDW 18.4 (H) 04/11/2024 1121   LYMPHSABS 2.0 04/11/2024 1121   MONOABS 0.7 04/11/2024 1121   EOSABS 0.3 04/11/2024 1121   BASOSABS 0.1 04/11/2024 1121    CMP     Component Value Date/Time   NA 140 03/13/2023 0907   NA 142 03/19/2019 0000   K 4.8 03/13/2023 0907   CL 103 03/13/2023 0907  CO2 25 03/13/2023 0907   GLUCOSE 99 03/13/2023 0907   BUN 18 03/13/2023 0907   BUN 16 03/19/2019 0000   CREATININE 1.13 03/13/2023 0907   CREATININE 1.14 (H) 08/30/2020 0813   CALCIUM  10.1 03/13/2023 0907   PROT 7.9 03/13/2023 0907   ALBUMIN 4.5 03/13/2023 0907   AST 16 03/13/2023 0907   AST 17 08/30/2020 0813   ALT 11 03/13/2023 0907   ALT 11 08/30/2020 0813   ALKPHOS 80 03/13/2023 0907   BILITOT 0.4 03/13/2023 0907   BILITOT 0.4 08/30/2020 0813   GFRNONAA >60 12/04/2020 0133   GFRNONAA 51 (L) 08/30/2020 0813   GFRAA >60 10/16/2019 0417     Assessment/Plan:       Camie Furbish, PA-C [facility] 04/13/2024, 8:06 PM  Patient Care Team: Regino Slater, MD as PCP - General (Family Medicine)

## 2024-04-14 ENCOUNTER — Other Ambulatory Visit (INDEPENDENT_AMBULATORY_CARE_PROVIDER_SITE_OTHER)

## 2024-04-14 ENCOUNTER — Encounter: Payer: Self-pay | Admitting: Gastroenterology

## 2024-04-14 ENCOUNTER — Ambulatory Visit: Admitting: Gastroenterology

## 2024-04-14 ENCOUNTER — Telehealth: Payer: Self-pay | Admitting: Gastroenterology

## 2024-04-14 ENCOUNTER — Ambulatory Visit: Payer: Self-pay | Admitting: Gastroenterology

## 2024-04-14 VITALS — BP 126/60 | HR 84 | Ht 64.5 in | Wt 177.0 lb

## 2024-04-14 DIAGNOSIS — K921 Melena: Secondary | ICD-10-CM | POA: Diagnosis not present

## 2024-04-14 DIAGNOSIS — R058 Other specified cough: Secondary | ICD-10-CM

## 2024-04-14 DIAGNOSIS — Z8719 Personal history of other diseases of the digestive system: Secondary | ICD-10-CM | POA: Diagnosis not present

## 2024-04-14 DIAGNOSIS — R0602 Shortness of breath: Secondary | ICD-10-CM

## 2024-04-14 DIAGNOSIS — D509 Iron deficiency anemia, unspecified: Secondary | ICD-10-CM

## 2024-04-14 DIAGNOSIS — K529 Noninfective gastroenteritis and colitis, unspecified: Secondary | ICD-10-CM

## 2024-04-14 LAB — CBC WITH DIFFERENTIAL/PLATELET
Basophils Absolute: 0.1 K/uL (ref 0.0–0.1)
Basophils Relative: 1.3 % (ref 0.0–3.0)
Eosinophils Absolute: 0.2 K/uL (ref 0.0–0.7)
Eosinophils Relative: 2.8 % (ref 0.0–5.0)
HCT: 29.6 % — ABNORMAL LOW (ref 36.0–46.0)
Hemoglobin: 9.3 g/dL — ABNORMAL LOW (ref 12.0–15.0)
Lymphocytes Relative: 23.8 % (ref 12.0–46.0)
Lymphs Abs: 1.9 K/uL (ref 0.7–4.0)
MCHC: 31.3 g/dL (ref 30.0–36.0)
MCV: 77.7 fl — ABNORMAL LOW (ref 78.0–100.0)
Monocytes Absolute: 0.6 K/uL (ref 0.1–1.0)
Monocytes Relative: 7.8 % (ref 3.0–12.0)
Neutro Abs: 5.1 K/uL (ref 1.4–7.7)
Neutrophils Relative %: 64.3 % (ref 43.0–77.0)
Platelets: 505 K/uL — ABNORMAL HIGH (ref 150.0–400.0)
RBC: 3.81 Mil/uL — ABNORMAL LOW (ref 3.87–5.11)
RDW: 18.2 % — ABNORMAL HIGH (ref 11.5–15.5)
WBC: 7.8 K/uL (ref 4.0–10.5)

## 2024-04-14 NOTE — Telephone Encounter (Signed)
 Additional recommendations received from Dr. Wilhelmenia regarding this patient.  He requests patient come in for a nurse visit in 1 to 2 weeks to check vitals, and can repeat CBC at that time, make sure she is still appropriate for procedures in the [facility]. (See result note). Please arrange nursing visit, thank you.

## 2024-04-14 NOTE — Telephone Encounter (Signed)
 Patient aware and agrees to this plan of care.

## 2024-04-14 NOTE — Telephone Encounter (Signed)
 Called the patient. No answer. Left message to return my call. Scheduled for vital signs check and labs on 04/24/24 at 2:00 pm. CBC in lab same day.

## 2024-04-14 NOTE — Patient Instructions (Signed)
 Pick up Iron  Supplement at your pharmacy  Call Hematology to reschedule your appointment to discuss iron  infusions  Follow up with PCP or go back to the walk in clinic for shortness of breath  GO TO ER if symptoms worsen or recurrence of black stools   Your provider has requested that you go to the basement level for lab work before leaving today. Press B on the elevator. The lab is located at the first door on the left as you exit the elevator.  Follow up per recommendations after procedures   Due to recent changes in healthcare laws, you may see the results of your imaging and laboratory studies on MyChart before your provider has had a chance to review them.  We understand that in some cases there may be results that are confusing or concerning to you. Not all laboratory results come back in the same time frame and the provider may be waiting for multiple results in order to interpret others.  Please give us  48 hours in order for your provider to thoroughly review all the results before contacting the office for clarification of your results.    You have been scheduled for an endoscopy and colonoscopy. Please follow the written instructions given to you at your visit today.  If you use inhalers (even only as needed), please bring them with you on the day of your procedure.  DO NOT TAKE 7 DAYS PRIOR TO TEST- Trulicity (dulaglutide) Ozempic, Wegovy (semaglutide) Mounjaro (tirzepatide) Bydureon Bcise (exanatide extended release)  DO NOT TAKE 1 DAY PRIOR TO YOUR TEST Rybelsus (semaglutide) Adlyxin (lixisenatide) Victoza (liraglutide) Byetta (exanatide) ___________________________________________________________________________   I appreciate the  opportunity to care for you  Thank You   Camie Heinz,PA-C

## 2024-04-14 NOTE — Telephone Encounter (Signed)
 Patient returning call  Requesting a call back  Please Advise Thank You

## 2024-04-14 NOTE — Progress Notes (Signed)
 Attending Physician's Attestation   I have reviewed the chart.   I agree with the Advanced Practitioner's note, impression, and recommendations with any updates as below. Appreciate you seeing her so quickly.  Appreciate our hematology colleagues seeing her and setting her up for IV iron  as necessary.  Plan will be for us  to recheck how she is doing in a couple of weeks to ensure that she is continuing to have healed from this bronchitis/pneumonia that she has been dealing with.  Ensure that she is doing well, may make sense for her to be seen by our nurse and do vitals and ensure that she is safe for procedures which are currently scheduled 1 month out.  I think that is adequate time, for her to continue to heal from an upper respiratory standpoint.  If issues continue to exist then we may have to transition her procedures to the hospital-based setting, but for now plan to keep a more they are.  I would recheck her hemoglobin in 1 to 2 weeks at the time of her vital sign visit with nursing.   Aloha Finner, MD [facility] Advanced Endoscopy Office # [phone]

## 2024-04-15 ENCOUNTER — Inpatient Hospital Stay: Attending: Family | Admitting: Family

## 2024-04-15 ENCOUNTER — Inpatient Hospital Stay

## 2024-04-15 ENCOUNTER — Inpatient Hospital Stay: Admitting: Family

## 2024-04-15 ENCOUNTER — Ambulatory Visit: Admitting: Family

## 2024-04-15 VITALS — BP 124/53 | HR 86 | Temp 98.7°F | Resp 17 | Wt 176.0 lb

## 2024-04-15 DIAGNOSIS — D500 Iron deficiency anemia secondary to blood loss (chronic): Secondary | ICD-10-CM | POA: Insufficient documentation

## 2024-04-15 DIAGNOSIS — K589 Irritable bowel syndrome without diarrhea: Secondary | ICD-10-CM | POA: Insufficient documentation

## 2024-04-15 LAB — CALPROTECTIN, FECAL: Calprotectin, Fecal: 786 ug/g — ABNORMAL HIGH (ref 0–120)

## 2024-04-15 NOTE — Progress Notes (Signed)
 Hematology and Oncology Follow Up Visit  Rhonda Bailey [id number] [birth year] 78 y.o. 04/15/2024   Principle Diagnosis:  Iron  deficiency anemia due to chronic blood loss with IBS (Crohn's vs. UC)   Current Therapy:  IV iron  as indicated    Interim History:  Rhonda Bailey is here today for follow-up and recent low iron  saturation and ferritin. She has been under a lot of stress with caring for her ex husband who recently passed away and now her daughter her is severely ill.  She states that last weekend she had a bad GI flare and had a lot of blood in her stool.  No other blood loss noted.  No abnormal bruising, no petechiae.  She notes fatigue, SOB with exertion and lightheadedness.  She is getting over a bout of pneumonia as well. She still has a cough. Lung sounds throughout are clear. She has mild SOB with exertion still.  No fever, chills, n/v, rash, chest pain, palpitations, abdominal pain or changes in bladder habits.  No swelling in her extremities. No numbness or tingling.  No falls or syncope reported.  Appetite and hydration remain good. Weight is stable at 176 lbs.   ECOG Performance Status: 1 - Symptomatic but completely ambulatory  Medications:  Allergies as of 04/15/2024       Reactions   Tramadol  Other (See Comments)   Says blew my head up.        Medication List        Accurate as of April 15, 2024  1:03 PM. If you have any questions, ask your nurse or doctor.          albuterol  1.25 MG/3ML nebulizer solution Commonly known as: ACCUNEB  Take 1 ampule by nebulization 2 (two) times daily as needed for wheezing.   ALPRAZolam  0.5 MG tablet Commonly known as: XANAX  Take 0.5 mg by mouth at bedtime as needed for sleep.   amLODipine  5 MG tablet Commonly known as: NORVASC  Take 5 mg by mouth daily.   bisacodyl  5 MG EC tablet Generic drug: bisacodyl  Take 5 mg by mouth daily as needed for moderate constipation.   cholecalciferol  25 MCG (1000 UNIT)  tablet Commonly known as: VITAMIN D3 Take 2,000 Units by mouth daily.   diphenhydramine-acetaminophen  25-500 MG Tabs tablet Commonly known as: TYLENOL  PM Take 1 tablet by mouth at bedtime as needed.   ferrous sulfate  325 (65 FE) MG EC tablet Take 1 tablet (325 mg total) by mouth daily with breakfast.   levothyroxine  50 MCG tablet Commonly known as: SYNTHROID  Take 50 mcg by mouth every morning.   multivitamin with minerals Tabs tablet Take 1 tablet by mouth daily.   pantoprazole  40 MG tablet Commonly known as: PROTONIX  Take 1 tablet (40 mg total) by mouth 2 (two) times daily before a meal. What changed: when to take this   predniSONE  20 MG tablet Commonly known as: DELTASONE  Take 20 mg by mouth 2 (two) times daily with a meal. For 5 days   simvastatin 10 MG tablet Commonly known as: ZOCOR Take 10 mg by mouth daily.   sodium bicarbonate  650 MG tablet Take 1 tablet (650 mg total) by mouth daily. What changed:  when to take this reasons to take this   sulfaSALAzine  500 MG tablet Commonly known as: AZULFIDINE  TAKE 1 TABLET BY MOUTH TWICE A DAY        Allergies:  Allergies  Allergen Reactions   Tramadol  Other (See Comments)    Says blew my head up.  Past Medical History, Surgical history, Social history, and Family History were reviewed and updated.  Review of Systems: All other 10 point review of systems is negative.   Physical Exam:  vitals were not taken for this visit.   Wt Readings from Last 3 Encounters:  04/14/24 177 lb (80.3 kg)  03/13/23 186 lb (84.4 kg)  11/01/22 189 lb 1.9 oz (85.8 kg)    Ocular: Sclerae unicteric, pupils equal, round and reactive to light Ear-nose-throat: Oropharynx clear, dentition fair Lymphatic: No cervical or supraclavicular adenopathy Lungs no rales or rhonchi, good excursion bilaterally Heart regular rate and rhythm, no murmur appreciated Abd soft, nontender, positive bowel sounds MSK no focal spinal tenderness,  no joint edema Neuro: non-focal, well-oriented, appropriate affect Breasts: Deferred   Lab Results  Component Value Date   WBC 7.8 04/14/2024   HGB 9.3 (L) 04/14/2024   HCT 29.6 (L) 04/14/2024   MCV 77.7 (L) 04/14/2024   PLT 505.0 (H) 04/14/2024   Lab Results  Component Value Date   FERRITIN 15.6 04/11/2024   IRON  6 (L) 04/11/2024   TIBC 373.8 04/11/2024   UIBC 268 11/01/2022   IRONPCTSAT 1.6 (L) 04/11/2024   Lab Results  Component Value Date   RETICCTPCT 2.0 11/01/2022   RBC 3.81 (L) 04/14/2024   RETICCTABS 62,860 12/09/2019   No results found for: JONATHAN BONG [facility] Lab Results  Component Value Date   IGGSERUM 870 12/09/2019   No results found for: STEPHANY CARLOTA BENSON MARKEL EARLA JOANNIE DOC VICK, SPEI   Chemistry      Component Value Date/Time   NA 140 03/13/2023 0907   NA 142 03/19/2019 0000   K 4.8 03/13/2023 0907   CL 103 03/13/2023 0907   CO2 25 03/13/2023 0907   BUN 18 03/13/2023 0907   BUN 16 03/19/2019 0000   CREATININE 1.13 03/13/2023 0907   CREATININE 1.14 (H) 08/30/2020 0813   GLU 101 03/19/2019 0000      Component Value Date/Time   CALCIUM  10.1 03/13/2023 0907   ALKPHOS 80 03/13/2023 0907   AST 16 03/13/2023 0907   AST 17 08/30/2020 0813   ALT 11 03/13/2023 0907   ALT 11 08/30/2020 0813   BILITOT 0.4 03/13/2023 0907   BILITOT 0.4 08/30/2020 0813       Impression and Plan: Rhonda Bailey is a very pleasant 78 yo caucasian female with iron  deficiency secondary to chronic blood loss with IBS (Crohn's vs. UC).  We will get her set up for 3 doses of Venofer .  Follow-up in 2 months.   Lauraine Pepper, NP 9/9/20251:03 PM

## 2024-04-16 NOTE — Progress Notes (Signed)
 Left message on machine to call back

## 2024-04-22 ENCOUNTER — Inpatient Hospital Stay

## 2024-04-22 ENCOUNTER — Ambulatory Visit: Admitting: Family

## 2024-04-24 ENCOUNTER — Inpatient Hospital Stay

## 2024-04-24 ENCOUNTER — Encounter

## 2024-04-24 VITALS — BP 131/53 | HR 75 | Temp 97.7°F | Resp 17

## 2024-04-24 DIAGNOSIS — K589 Irritable bowel syndrome without diarrhea: Secondary | ICD-10-CM | POA: Diagnosis not present

## 2024-04-24 DIAGNOSIS — D500 Iron deficiency anemia secondary to blood loss (chronic): Secondary | ICD-10-CM

## 2024-04-24 MED ORDER — SODIUM CHLORIDE 0.9 % IV SOLN: Freq: Once | INTRAVENOUS | Status: AC

## 2024-04-24 MED ORDER — IRON SUCROSE 300 MG IVPB - SIMPLE MED
300.0000 mg | Freq: Once | Status: AC
  Administered 2024-04-24: 300 mg via INTRAVENOUS
  Filled 2024-04-24: qty 300

## 2024-04-24 NOTE — Patient Instructions (Signed)

## 2024-04-24 NOTE — Telephone Encounter (Signed)
 Patient at [facility] today for infusion. She is cancelling getting her BP and labs check here today and have these things done at the [facility].

## 2024-04-27 DIAGNOSIS — N1831 Chronic kidney disease, stage 3a: Secondary | ICD-10-CM | POA: Diagnosis not present

## 2024-04-27 DIAGNOSIS — I10 Essential (primary) hypertension: Secondary | ICD-10-CM | POA: Diagnosis not present

## 2024-04-29 ENCOUNTER — Inpatient Hospital Stay

## 2024-04-29 NOTE — Telephone Encounter (Signed)
 Patient did not have labs on the day of her infusuion. She will have another infusion 05/01/24. She agrees to come to our lab same day for a CBC.

## 2024-05-01 ENCOUNTER — Other Ambulatory Visit (INDEPENDENT_AMBULATORY_CARE_PROVIDER_SITE_OTHER)

## 2024-05-01 ENCOUNTER — Ambulatory Visit: Payer: Self-pay | Admitting: Gastroenterology

## 2024-05-01 ENCOUNTER — Inpatient Hospital Stay

## 2024-05-01 VITALS — BP 125/65 | HR 75 | Temp 98.2°F | Resp 18

## 2024-05-01 DIAGNOSIS — D500 Iron deficiency anemia secondary to blood loss (chronic): Secondary | ICD-10-CM

## 2024-05-01 DIAGNOSIS — D509 Iron deficiency anemia, unspecified: Secondary | ICD-10-CM | POA: Diagnosis not present

## 2024-05-01 DIAGNOSIS — K589 Irritable bowel syndrome without diarrhea: Secondary | ICD-10-CM | POA: Diagnosis not present

## 2024-05-01 LAB — CBC WITH DIFFERENTIAL/PLATELET
Basophils Absolute: 0.1 K/uL (ref 0.0–0.1)
Basophils Relative: 0.7 % (ref 0.0–3.0)
Eosinophils Absolute: 0.2 K/uL (ref 0.0–0.7)
Eosinophils Relative: 2 % (ref 0.0–5.0)
HCT: 32.9 % — ABNORMAL LOW (ref 36.0–46.0)
Hemoglobin: 10.1 g/dL — ABNORMAL LOW (ref 12.0–15.0)
Lymphocytes Relative: 29.4 % (ref 12.0–46.0)
Lymphs Abs: 2.3 K/uL (ref 0.7–4.0)
MCHC: 30.7 g/dL (ref 30.0–36.0)
MCV: 77.3 fl — ABNORMAL LOW (ref 78.0–100.0)
Monocytes Absolute: 0.7 K/uL (ref 0.1–1.0)
Monocytes Relative: 9.6 % (ref 3.0–12.0)
Neutro Abs: 4.5 K/uL (ref 1.4–7.7)
Neutrophils Relative %: 58.3 % (ref 43.0–77.0)
Platelets: 395 K/uL (ref 150.0–400.0)
RBC: 4.26 Mil/uL (ref 3.87–5.11)
RDW: 19.8 % — ABNORMAL HIGH (ref 11.5–15.5)
WBC: 7.8 K/uL (ref 4.0–10.5)

## 2024-05-01 MED ORDER — IRON SUCROSE 300 MG IVPB - SIMPLE MED
300.0000 mg | Freq: Once | Status: AC
  Administered 2024-05-01: 300 mg via INTRAVENOUS
  Filled 2024-05-01: qty 300

## 2024-05-01 MED ORDER — SODIUM CHLORIDE 0.9 % IV SOLN: Freq: Once | INTRAVENOUS | Status: AC

## 2024-05-01 NOTE — Patient Instructions (Signed)

## 2024-05-06 ENCOUNTER — Inpatient Hospital Stay

## 2024-05-06 DIAGNOSIS — I10 Essential (primary) hypertension: Secondary | ICD-10-CM | POA: Diagnosis not present

## 2024-05-06 DIAGNOSIS — E7800 Pure hypercholesterolemia, unspecified: Secondary | ICD-10-CM | POA: Diagnosis not present

## 2024-05-06 DIAGNOSIS — E039 Hypothyroidism, unspecified: Secondary | ICD-10-CM | POA: Diagnosis not present

## 2024-05-06 DIAGNOSIS — N1831 Chronic kidney disease, stage 3a: Secondary | ICD-10-CM | POA: Diagnosis not present

## 2024-05-08 ENCOUNTER — Inpatient Hospital Stay: Attending: Hematology & Oncology

## 2024-05-08 VITALS — BP 133/59 | HR 88 | Temp 98.1°F | Resp 20

## 2024-05-08 DIAGNOSIS — K589 Irritable bowel syndrome without diarrhea: Secondary | ICD-10-CM | POA: Diagnosis not present

## 2024-05-08 DIAGNOSIS — D500 Iron deficiency anemia secondary to blood loss (chronic): Secondary | ICD-10-CM | POA: Diagnosis present

## 2024-05-08 MED ORDER — IRON SUCROSE 300 MG IVPB - SIMPLE MED
300.0000 mg | Freq: Once | Status: AC
  Administered 2024-05-08: 300 mg via INTRAVENOUS
  Filled 2024-05-08: qty 300

## 2024-05-08 MED ORDER — SODIUM CHLORIDE 0.9 % IV SOLN: Freq: Once | INTRAVENOUS | Status: AC

## 2024-05-08 NOTE — Patient Instructions (Signed)

## 2024-05-08 NOTE — Progress Notes (Signed)
 1450-Pt refused to stay for 30 minutes post iron  infusion and is without complaints at time of discharge.

## 2024-05-13 ENCOUNTER — Encounter: Payer: Self-pay | Admitting: Gastroenterology

## 2024-05-13 ENCOUNTER — Ambulatory Visit (AMBULATORY_SURGERY_CENTER): Admitting: Gastroenterology

## 2024-05-13 VITALS — BP 142/64 | HR 83 | Temp 98.4°F | Resp 16 | Ht 64.5 in | Wt 177.0 lb

## 2024-05-13 DIAGNOSIS — K259 Gastric ulcer, unspecified as acute or chronic, without hemorrhage or perforation: Secondary | ICD-10-CM | POA: Diagnosis not present

## 2024-05-13 DIAGNOSIS — K644 Residual hemorrhoidal skin tags: Secondary | ICD-10-CM

## 2024-05-13 DIAGNOSIS — K2970 Gastritis, unspecified, without bleeding: Secondary | ICD-10-CM | POA: Diagnosis not present

## 2024-05-13 DIAGNOSIS — K640 First degree hemorrhoids: Secondary | ICD-10-CM | POA: Diagnosis not present

## 2024-05-13 DIAGNOSIS — Q439 Congenital malformation of intestine, unspecified: Secondary | ICD-10-CM

## 2024-05-13 DIAGNOSIS — D509 Iron deficiency anemia, unspecified: Secondary | ICD-10-CM

## 2024-05-13 DIAGNOSIS — K31A11 Gastric intestinal metaplasia without dysplasia, involving the antrum: Secondary | ICD-10-CM | POA: Diagnosis not present

## 2024-05-13 DIAGNOSIS — K229 Disease of esophagus, unspecified: Secondary | ICD-10-CM | POA: Diagnosis not present

## 2024-05-13 DIAGNOSIS — K624 Stenosis of anus and rectum: Secondary | ICD-10-CM | POA: Diagnosis not present

## 2024-05-13 DIAGNOSIS — I10 Essential (primary) hypertension: Secondary | ICD-10-CM | POA: Diagnosis not present

## 2024-05-13 DIAGNOSIS — K449 Diaphragmatic hernia without obstruction or gangrene: Secondary | ICD-10-CM | POA: Diagnosis not present

## 2024-05-13 DIAGNOSIS — K6289 Other specified diseases of anus and rectum: Secondary | ICD-10-CM

## 2024-05-13 DIAGNOSIS — K529 Noninfective gastroenteritis and colitis, unspecified: Secondary | ICD-10-CM | POA: Diagnosis not present

## 2024-05-13 DIAGNOSIS — K2289 Other specified disease of esophagus: Secondary | ICD-10-CM | POA: Diagnosis not present

## 2024-05-13 DIAGNOSIS — F419 Anxiety disorder, unspecified: Secondary | ICD-10-CM | POA: Diagnosis not present

## 2024-05-13 DIAGNOSIS — K5150 Left sided colitis without complications: Secondary | ICD-10-CM | POA: Diagnosis not present

## 2024-05-13 DIAGNOSIS — K2951 Unspecified chronic gastritis with bleeding: Secondary | ICD-10-CM | POA: Diagnosis not present

## 2024-05-13 DIAGNOSIS — K2950 Unspecified chronic gastritis without bleeding: Secondary | ICD-10-CM | POA: Diagnosis not present

## 2024-05-13 MED ORDER — SODIUM CHLORIDE 0.9 % IV SOLN: 500.0000 mL | Freq: Once | INTRAVENOUS | Status: DC

## 2024-05-13 NOTE — Patient Instructions (Addendum)
 Thank you for letting us  care for your healthcare needs today! Please see handouts regarding Hiatal Hernia, Gastritis, Hemorrhoids, and High Fiber Diet. Increase pantoprazole  back to twice daily. Continue current medication. Await pathology results. Repeat colonoscopy in 1-2 years.  YOU HAD AN ENDOSCOPIC PROCEDURE TODAY AT THE [facility]:   Refer to the procedure report that was given to you for any specific questions about what was found during the examination.  If the procedure report does not answer your questions, please call your gastroenterologist to clarify.  If you requested that your care partner not be given the details of your procedure findings, then the procedure report has been included in a sealed envelope for you to review at your convenience later.  YOU SHOULD EXPECT: Some feelings of bloating in the abdomen. Passage of more gas than usual.  Walking can help get rid of the air that was put into your GI tract during the procedure and reduce the bloating. If you had a lower endoscopy (such as a colonoscopy or flexible sigmoidoscopy) you may notice spotting of blood in your stool or on the toilet paper. If you underwent a bowel prep for your procedure, you may not have a normal bowel movement for a few days.  Please Note:  You might notice some irritation and congestion in your nose or some drainage.  This is from the oxygen used during your procedure.  There is no need for concern and it should clear up in a day or so.  SYMPTOMS TO REPORT IMMEDIATELY:  Following lower endoscopy (colonoscopy or flexible sigmoidoscopy):  Excessive amounts of blood in the stool  Significant tenderness or worsening of abdominal pains  Swelling of the abdomen that is new, acute  Fever of 100F or higher  Following upper endoscopy (EGD)  Vomiting of blood or coffee ground material  New chest pain or pain under the shoulder blades  Painful or persistently difficult swallowing  New shortness  of breath  Fever of 100F or higher  Black, tarry-looking stools  For urgent or emergent issues, a gastroenterologist can be reached at any hour by calling [phone]. Do not use MyChart messaging for urgent concerns.    DIET:  We do recommend a small meal at first, but then you may proceed to your regular diet.  Drink plenty of fluids but you should avoid alcoholic beverages for 24 hours.  ACTIVITY:  You should plan to take it easy for the rest of today and you should NOT DRIVE or use heavy machinery until tomorrow (because of the sedation medicines used during the test).    FOLLOW UP: Our staff will call the number listed on your records the next business day following your procedure.  We will call around 7:15- 8:00 am to check on you and address any questions or concerns that you may have regarding the information given to you following your procedure. If we do not reach you, we will leave a message.     If any biopsies were taken you will be contacted by phone or by letter within the next 1-3 weeks.  Please call us  at [phone] if you have not heard about the biopsies in 3 weeks.    SIGNATURES/CONFIDENTIALITY: You and/or your care partner have signed paperwork which will be entered into your electronic medical record.  These signatures attest to the fact that that the information above on your After Visit Summary has been reviewed and is understood.  Full responsibility of the confidentiality  of this discharge information lies with you and/or your care-partner.

## 2024-05-13 NOTE — Progress Notes (Signed)
 GASTROENTEROLOGY PROCEDURE H&P NOTE   Primary Care Physician: Regino Slater, MD  HPI: Rhonda Bailey is a 78 y.o. female who presents for EGD/Colonoscopy for evaluation of IDA and history of IBD/UC with elevated fecal calprotectin.  Past Medical History:  Diagnosis Date   Anal fissure    Anxiety    Anxiety disorder, unspecified    Essential (primary) hypertension    Gastro-esophageal reflux disease with esophagitis    GERD (gastroesophageal reflux disease)    Hypokalemia    Hypothyroidism    Kidney stone    Noninfective gastroenteritis and colitis, unspecified    Pinched vertebral nerve    back   Sleep apnea    Past Surgical History:  Procedure Laterality Date   BIOPSY  09/02/2018   Procedure: BIOPSY;  Surgeon: Shaaron Lamar CHRISTELLA, MD;  Location: [facility];  Service: Endoscopy;;  colon    CHOLECYSTECTOMY  05/19/2011   Procedure: LAPAROSCOPIC CHOLECYSTECTOMY;  Surgeon: Oneil DELENA Budge;  Location: [facility];  Service: General;  Laterality: N/A;   COLONOSCOPY  11/2019   Dr. Wilhelmenia:  rectal ulceration, TI and IC valve appeared normal, 8 mm polyp in proximal ascending colon that was sessile. Inflammation in transverse colon to cecum, inflammation with altered vascularity, edema, erosions, erythema, friability, shallow ulcerations in a continuous and circumferential pattern from rectum to splenix flexure, single twenty mm ulcer in distal rectum. Small and large-   COLONOSCOPY WITH PROPOFOL  N/A 09/02/2018   Dr. Shaaron: Proctocolitis limited to the left side with some exudate versus partial pseudomembrane formation, erosions and ulceration somewhat superficial.  Terminal ileum normal.  Diverticulosis.  Descending/sigmoid colon biopsy showing severe active colitis, diagnostic features of IBD not identified.  Differential includes infection, ischemia.  Rectal biopsies with granulation tissue c/w ulcer   CYSTOSCOPY W/ URETERAL STENT PLACEMENT Right 05/08/2019   Procedure: CYSTOSCOPY WITH  RETROGRADE PYELOGRAM/URETERAL STENT PLACEMENT;  Surgeon: Nieves Cough, MD;  Location: [facility];  Service: Urology;  Laterality: Right;   CYSTOSCOPY W/ URETERAL STENT PLACEMENT Left 05/17/2020   Procedure: CYSTOSCOPY WITH RETROGRADE PYELOGRAM/URETERAL STENT PLACEMENT;  Surgeon: Elisabeth Valli BIRCH, MD;  Location: [facility];  Service: Urology;  Laterality: Left;   CYSTOSCOPY WITH RETROGRADE PYELOGRAM, URETEROSCOPY AND STENT PLACEMENT Left 06/23/2020   Procedure: CYSTOSCOPY WITH LEFT RETROGRADE PYELOGRAM, LEFT URETEROSCOPY AND STENT EXCHANGE;  Surgeon: Sherrilee Belvie CROME, MD;  Location: [facility];  Service: Urology;  Laterality: Left;   CYSTOSCOPY WITH URETEROSCOPY Right 06/11/2019   Procedure: CYSTOSCOPY WITH DIAGNOSTIC URETEROSCOPY; URETERAL STENT REMOVAL;  Surgeon: Sherrilee Belvie CROME, MD;  Location: [facility];  Service: Urology;  Laterality: Right;   HEMORRHOID SURGERY N/A 09/16/2019   Procedure: EXTENSIVE HEMORRHOIDECTOMY;  Surgeon: Budge Oneil, MD;  Location: [facility];  Service: General;  Laterality: N/A;   RECTAL BIOPSY N/A 09/16/2019   Procedure: BIOPSY RECTAL;  Surgeon: Budge Oneil, MD;  Location: [facility];  Service: General;  Laterality: N/A;   STONE EXTRACTION WITH BASKET  06/23/2020   Procedure: LEFT URETERAL STONE EXTRACTION WITH BASKET;  Surgeon: Sherrilee Belvie CROME, MD;  Location: [facility];  Service: Urology;;   TUBAL LIGATION     Current Outpatient Medications  Medication Sig Dispense Refill   albuterol  (ACCUNEB ) 1.25 MG/3ML nebulizer solution Take 1 ampule by nebulization 2 (two) times daily as needed for wheezing.     ALPRAZolam  (XANAX ) 0.5 MG tablet Take 0.5 mg by mouth at bedtime as needed for sleep.     amLODipine  (NORVASC ) 5 MG tablet Take 5 mg by mouth daily.  bisacodyl  5 MG EC tablet Take 5 mg by mouth daily as needed for moderate constipation.     cholecalciferol  (VITAMIN D3) 25 MCG (1000 UNIT) tablet Take 2,000 Units by mouth daily.     diphenhydramine-acetaminophen  (TYLENOL  PM)  25-500 MG TABS tablet Take 1 tablet by mouth at bedtime as needed.     ferrous sulfate  325 (65 FE) MG EC tablet Take 1 tablet (325 mg total) by mouth daily with breakfast. (Patient not taking: Reported on 04/15/2024) 30 tablet 3   levothyroxine  (SYNTHROID ) 50 MCG tablet Take 50 mcg by mouth every morning.     Multiple Vitamin (MULTIVITAMIN WITH MINERALS) TABS tablet Take 1 tablet by mouth daily.     pantoprazole  (PROTONIX ) 40 MG tablet Take 1 tablet (40 mg total) by mouth 2 (two) times daily before a meal. (Patient taking differently: Take 40 mg by mouth daily.) 60 tablet 1   predniSONE  (DELTASONE ) 20 MG tablet Take 20 mg by mouth 2 (two) times daily with a meal. For 5 days (Patient not taking: Reported on 04/15/2024)     simvastatin (ZOCOR) 10 MG tablet Take 10 mg by mouth daily. (Patient not taking: Reported on 04/15/2024)     sodium bicarbonate  650 MG tablet Take 1 tablet (650 mg total) by mouth daily. (Patient not taking: Reported on 04/15/2024) 30 tablet 0   sulfaSALAzine  (AZULFIDINE ) 500 MG tablet TAKE 1 TABLET BY MOUTH TWICE A DAY 180 tablet 2   No current facility-administered medications for this visit.    Current Outpatient Medications:    albuterol  (ACCUNEB ) 1.25 MG/3ML nebulizer solution, Take 1 ampule by nebulization 2 (two) times daily as needed for wheezing., Disp: , Rfl:    ALPRAZolam  (XANAX ) 0.5 MG tablet, Take 0.5 mg by mouth at bedtime as needed for sleep., Disp: , Rfl:    amLODipine  (NORVASC ) 5 MG tablet, Take 5 mg by mouth daily., Disp: , Rfl:    bisacodyl  5 MG EC tablet, Take 5 mg by mouth daily as needed for moderate constipation., Disp: , Rfl:    cholecalciferol  (VITAMIN D3) 25 MCG (1000 UNIT) tablet, Take 2,000 Units by mouth daily., Disp: , Rfl:    diphenhydramine-acetaminophen  (TYLENOL  PM) 25-500 MG TABS tablet, Take 1 tablet by mouth at bedtime as needed., Disp: , Rfl:    ferrous sulfate  325 (65 FE) MG EC tablet, Take 1 tablet (325 mg total) by mouth daily with breakfast.  (Patient not taking: Reported on 04/15/2024), Disp: 30 tablet, Rfl: 3   levothyroxine  (SYNTHROID ) 50 MCG tablet, Take 50 mcg by mouth every morning., Disp: , Rfl:    Multiple Vitamin (MULTIVITAMIN WITH MINERALS) TABS tablet, Take 1 tablet by mouth daily., Disp: , Rfl:    pantoprazole  (PROTONIX ) 40 MG tablet, Take 1 tablet (40 mg total) by mouth 2 (two) times daily before a meal. (Patient taking differently: Take 40 mg by mouth daily.), Disp: 60 tablet, Rfl: 1   predniSONE  (DELTASONE ) 20 MG tablet, Take 20 mg by mouth 2 (two) times daily with a meal. For 5 days (Patient not taking: Reported on 04/15/2024), Disp: , Rfl:    simvastatin (ZOCOR) 10 MG tablet, Take 10 mg by mouth daily. (Patient not taking: Reported on 04/15/2024), Disp: , Rfl:    sodium bicarbonate  650 MG tablet, Take 1 tablet (650 mg total) by mouth daily. (Patient not taking: Reported on 04/15/2024), Disp: 30 tablet, Rfl: 0   sulfaSALAzine  (AZULFIDINE ) 500 MG tablet, TAKE 1 TABLET BY MOUTH TWICE A DAY, Disp: 180 tablet, Rfl: 2  Allergies  Allergen Reactions   Tramadol  Other (See Comments)    Says blew my head up.   Family History  Problem Relation Age of Onset   Cancer Father        mouth   Heart disease Sister    Heart disease Brother    Cancer Brother        head   Cancer Daughter        Leiomyosarcoma   Anesthesia problems Neg Hx    Hypotension Neg Hx    Malignant hyperthermia Neg Hx    Pseudochol deficiency Neg Hx    Colon cancer Neg Hx    Esophageal cancer Neg Hx    Inflammatory bowel disease Neg Hx    Liver disease Neg Hx    Pancreatic cancer Neg Hx    Rectal cancer Neg Hx    Stomach cancer Neg Hx    Social History   Socioeconomic History   Marital status: Divorced    Spouse name: Not on file   Number of children: 2   Years of education: Not on file   Highest education level: Not on file  Occupational History   Occupation: retired  Tobacco Use   Smoking status: Never   Smokeless tobacco: Never  Vaping Use    Vaping status: Never Used  Substance and Sexual Activity   Alcohol use: Not Currently    Comment: once a year   Drug use: No   Sexual activity: Not Currently  Other Topics Concern   Not on file  Social History Narrative   Not on file   Social Drivers of Health   Financial Resource Strain: Low Risk  (05/08/2019)   Overall Financial Resource Strain (CARDIA)    Difficulty of Paying Living Expenses: Not hard at all  Food Insecurity: No Food Insecurity (05/08/2019)   Hunger Vital Sign    Worried About Running Out of Food in the Last Year: Never true    Ran Out of Food in the Last Year: Never true  Transportation Needs: No Transportation Needs (05/08/2019)   PRAPARE - Administrator, Civil Service (Medical): No    Lack of Transportation (Non-Medical): No  Physical Activity: Sufficiently Active (05/08/2019)   Exercise Vital Sign    Days of Exercise per Week: 5 days    Minutes of Exercise per Session: 30 min  Stress: No Stress Concern Present (05/08/2019)   [employer] of Occupational Health - Occupational Stress Questionnaire    Feeling of Stress : Not at all  Social Connections: Socially Isolated (05/08/2019)   Social Connection and Isolation Panel    Frequency of Communication with Friends and Family: More than three times a week    Frequency of Social Gatherings with Friends and Family: More than three times a week    Attends Religious Services: Never    Database administrator or Organizations: No    Attends Banker Meetings: Never    Marital Status: Divorced  Catering manager Violence: Not At Risk (05/08/2019)   Humiliation, Afraid, Rape, and Kick questionnaire    Fear of Current or Ex-Partner: No    Emotionally Abused: No    Physically Abused: No    Sexually Abused: No    Physical Exam: There were no vitals filed for this visit. There is no height or weight on file to calculate BMI. GEN: NAD EYE: Sclerae anicteric ENT: MMM CV:  Non-tachycardic GI: Soft, NT/ND NEURO:  Alert & Oriented x 3  Lab Results: No results for input(s): WBC, HGB, HCT, PLT in the last 72 hours. BMET No results for input(s): NA, K, CL, CO2, GLUCOSE, BUN, CREATININE, CALCIUM  in the last 72 hours. LFT No results for input(s): PROT, ALBUMIN, AST, ALT, ALKPHOS, BILITOT, BILIDIR, IBILI in the last 72 hours. PT/INR No results for input(s): LABPROT, INR in the last 72 hours.   Impression / Plan: This is a 78 y.o.female who presents for EGD/Colonoscopy for evaluation of IDA and history of IBD/UC with elevated fecal calprotectin.  The risks and benefits of endoscopic evaluation/treatment were discussed with the patient and/or family; these include but are not limited to the risk of perforation, infection, bleeding, missed lesions, lack of diagnosis, severe illness requiring hospitalization, as well as anesthesia and sedation related illnesses.  The patient's history has been reviewed, patient examined, no change in status, and deemed stable for procedure.  The patient and/or family is agreeable to proceed.    Aloha Finner, MD [facility] Office # [phone]

## 2024-05-13 NOTE — Progress Notes (Signed)
 Pt's states no medical or surgical changes since previsit or office visit.

## 2024-05-13 NOTE — Op Note (Signed)
 [facility] Patient Name: Rhonda Bailey Procedure Date: 05/13/2024 10:00 AM MRN: [id number] Endoscopist: Aloha Finner , MD, [id number] Age: 78 Referring MD:  Date of Birth: 1946-04-17 Gender: Female Account #: [id number] Procedure:                Upper GI endoscopy Indications:              Iron  deficiency anemia Medicines:                Monitored Anesthesia Care Procedure:                Pre-Anesthesia Assessment:                           - Prior to the procedure, a History and Physical                            was performed, and patient medications and                            allergies were reviewed. The patient's tolerance of                            previous anesthesia was also reviewed. The risks                            and benefits of the procedure and the sedation                            options and risks were discussed with the patient.                            All questions were answered, and informed consent                            was obtained. Prior Anticoagulants: The patient has                            taken no anticoagulant or antiplatelet agents. ASA                            Grade Assessment: II - [name] with mild systemic                            disease. After reviewing the risks and benefits,                            the patient was deemed in satisfactory condition to                            undergo the procedure.                           After obtaining informed consent, the endoscope was  passed under direct vision. Throughout the                            procedure, the patient's blood pressure, pulse, and                            oxygen saturations were monitored continuously. The                            GIF [id number] #[id number] was introduced through the                            mouth, and advanced to the second part of duodenum.                            The upper GI endoscopy was  accomplished without                            difficulty. The patient tolerated the procedure. Scope In: Scope Out: Findings:                 No gross lesions were noted in the entire esophagus.                           The Z-line was irregular and was found 33 cm from                            the incisors.                           A 6 cm hiatal hernia was present.                           Patchy mild inflammation characterized by erosions                            and erythema was found in the gastric antrum and in                            the prepyloric region of the stomach.                           No other gross lesions were noted in the entire                            examined stomach. Biopsies were taken with a cold                            forceps for histology and Helicobacter pylori                            testing.                           No gross  lesions were noted in the duodenal bulb,                            in the first portion of the duodenum and in the                            second portion of the duodenum. Biopsies for                            histology were taken with a cold forceps for                            evaluation of celiac disease. Complications:            No immediate complications. Estimated Blood Loss:     Estimated blood loss was minimal. Impression:               - No gross lesions in the entire esophagus. Z-line                            irregular, 33 cm from the incisors.                           - 6 cm hiatal hernia.                           - Gastritis in antrum/prepylorus. No other gross                            lesions in the entire stomach. Biopsied.                           - No gross lesions in the duodenal bulb, in the                            first portion of the duodenum and in the second                            portion of the duodenum. Biopsied. Recommendation:           - Proceed to scheduled  colonoscopy.                           - Increase PPI back to twice daily dosing to heal                            GI tract lining (in case this is contributing to                            her IDA).                           - Continue present medications.                           -  Await pathology results.                           - The findings and recommendations were discussed                            with the patient.                           - The findings and recommendations were discussed                            with the patient's family. Aloha Finner, MD 05/13/2024 10:34:12 AM

## 2024-05-13 NOTE — Progress Notes (Signed)
 A/o x 3, VSS, good SR's, pleased with anesthesia, report to RN

## 2024-05-13 NOTE — Op Note (Signed)
 [facility] Patient Name: Rhonda Bailey Procedure Date: 05/13/2024 9:59 AM MRN: [id number] Endoscopist: Aloha Finner , MD, [id number] Age: 78 Referring MD:  Date of Birth: 1946-06-10 Gender: Female Account #: [id number] Procedure:                Colonoscopy Indications:              Determine extent and severity of inflammatory bowel                            disease, Iron  deficiency anemia Medicines:                Monitored Anesthesia Care Procedure:                Pre-Anesthesia Assessment:                           - Prior to the procedure, a History and Physical                            was performed, and patient medications and                            allergies were reviewed. The patient's tolerance of                            previous anesthesia was also reviewed. The risks                            and benefits of the procedure and the sedation                            options and risks were discussed with the patient.                            All questions were answered, and informed consent                            was obtained. Prior Anticoagulants: The patient has                            taken no anticoagulant or antiplatelet agents. ASA                            Grade Assessment: II - [name] with mild systemic                            disease. After reviewing the risks and benefits,                            the patient was deemed in satisfactory condition to                            undergo the procedure.  After obtaining informed consent, the colonoscope                            was passed under direct vision. Throughout the                            procedure, the patient's blood pressure, pulse, and                            oxygen saturations were monitored continuously. The                            Olympus CF-HQ190L ([id number]) Colonoscope was                            introduced through the  anus and advanced to the the                            cecum, identified by appendiceal orifice and                            ileocecal valve. The colonoscopy was somewhat                            difficult due to a tortuous colon. Successful                            completion of the procedure was aided by changing                            the patient's position, using manual pressure,                            straightening and shortening the scope to obtain                            bowel loop reduction and using scope torsion. The                            patient tolerated the procedure. The quality of the                            bowel preparation was good. The ileocecal valve,                            appendiceal orifice, and rectum were photographed. Scope In: 10:14:50 AM Scope Out: 10:28:46 AM Scope Withdrawal Time: 0 hours 9 minutes 50 seconds  Total Procedure Duration: 0 hours 13 minutes 56 seconds  Findings:                 Skin tags were found on perianal exam.                           The digital rectal exam findings include rectal  stricture. Pertinent negatives include no palpable                            rectal lesions.                           The left colon was moderately tortuous.                           Normal mucosa was found at the splenic flexure, in                            the transverse colon, at the hepatic flexure, in                            the ascending colon, in the cecum and at the                            appendiceal orifice. Biopsies were taken with a                            cold forceps for histology.                           Segmental moderate mucosal changes characterized by                            congestion (edema), friability and granularity were                            found in the recto-sigmoid colon, in the sigmoid                            colon and in the descending colon.  Biopsies were                            taken with a cold forceps for histology.                           Normal mucosa was found in the rectum. Biopsies                            were taken with a cold forceps for histology.                           Anal papilla was hypertrophied.                           Non-bleeding non-thrombosed internal hemorrhoids                            were found during retroflexion. The hemorrhoids                            were Grade I (  internal hemorrhoids that do not                            prolapse). Complications:            No immediate complications. Estimated Blood Loss:     Estimated blood loss was minimal. Impression:               - Perianal skin tags found on perianal exam.                           - Rectal stricture found on digital rectal exam.                           - Tortuous colon.                           - Normal mucosa at the splenic flexure, in the                            transverse colon, at the hepatic flexure, in the                            ascending colon, in the cecum and at the                            appendiceal orifice. Biopsied.                           - Segmental moderate mucosal changes were found in                            the recto-sigmoid colon, in the sigmoid colon and                            in the descending colon secondary to colitis.                            Biopsied.                           - Normal mucosa in the rectum. Biopsied.                           - Anal papilla was hypertrophied.                           - Non-bleeding non-thrombosed internal hemorrhoids. Recommendation:           - The patient will be observed post-procedure,                            until all discharge criteria are met.                           - Discharge patient to home.                           -  Patient has a contact number available for                            emergencies. The signs and  symptoms of potential                            delayed complications were discussed with the                            patient. Return to normal activities tomorrow.                            Written discharge instructions were provided to the                            patient.                           - High fiber diet.                           - Continue present medications.                           - Await pathology results.                           - Repeat colonoscopy in likely 1-2 years for                            surveillance based on pathology results and                            potential changes in therapy.                           - Will consider role of VCE, but if active colitis                            is noted, this may be the main cause of a                            microscopic blood loss. Further to come.                           - The findings and recommendations were discussed                            with the patient.                           - The findings and recommendations were discussed                            with the patient's family. Aloha Finner, MD 05/13/2024 10:38:24 AM

## 2024-05-14 ENCOUNTER — Telehealth: Payer: Self-pay | Admitting: *Deleted

## 2024-05-14 NOTE — Telephone Encounter (Signed)
 Post procedure follow up call placed, no answer and left VM.

## 2024-05-16 LAB — SURGICAL PATHOLOGY

## 2024-05-21 ENCOUNTER — Ambulatory Visit: Payer: Self-pay | Admitting: Gastroenterology

## 2024-05-22 ENCOUNTER — Other Ambulatory Visit: Payer: Self-pay

## 2024-05-22 DIAGNOSIS — K6289 Other specified diseases of anus and rectum: Secondary | ICD-10-CM

## 2024-05-22 DIAGNOSIS — K51919 Ulcerative colitis, unspecified with unspecified complications: Secondary | ICD-10-CM

## 2024-05-22 MED ORDER — SULFASALAZINE 500 MG PO TABS: 2000.0000 mg | ORAL_TABLET | Freq: Every day | ORAL | 6 refills | Status: AC

## 2024-05-22 NOTE — Progress Notes (Signed)
 Left message on machine to call back   EGD recall has been entered  Sulfasalazine  refill sent for 2000 mg daily  Follow up has been made for 07/07/24 at 10 am  Lab orders have been entered

## 2024-05-27 ENCOUNTER — Telehealth: Payer: Self-pay | Admitting: Gastroenterology

## 2024-05-27 DIAGNOSIS — I10 Essential (primary) hypertension: Secondary | ICD-10-CM | POA: Diagnosis not present

## 2024-05-27 DIAGNOSIS — N1831 Chronic kidney disease, stage 3a: Secondary | ICD-10-CM | POA: Diagnosis not present

## 2024-05-27 NOTE — Telephone Encounter (Signed)
 Inbound call from patient stating sulfaSALAzine  medication has been causing her constipation. Requesting to know if she should change to a lower dosage or if there are other recommendations for constipation. Requesting a call back to discuss further. Please advise, thank you.

## 2024-05-27 NOTE — Telephone Encounter (Signed)
 The pt has been advised to add a dose of miralax to her regimen and titrate as needed to maintain bowel habits.  She will call back if this does not help.

## 2024-06-06 DIAGNOSIS — I10 Essential (primary) hypertension: Secondary | ICD-10-CM | POA: Diagnosis not present

## 2024-06-06 DIAGNOSIS — E039 Hypothyroidism, unspecified: Secondary | ICD-10-CM | POA: Diagnosis not present

## 2024-06-06 DIAGNOSIS — N1831 Chronic kidney disease, stage 3a: Secondary | ICD-10-CM | POA: Diagnosis not present

## 2024-06-06 DIAGNOSIS — E7800 Pure hypercholesterolemia, unspecified: Secondary | ICD-10-CM | POA: Diagnosis not present

## 2024-06-17 ENCOUNTER — Inpatient Hospital Stay: Admitting: Family

## 2024-06-17 ENCOUNTER — Inpatient Hospital Stay

## 2024-06-17 ENCOUNTER — Other Ambulatory Visit: Payer: Self-pay

## 2024-06-17 ENCOUNTER — Encounter: Payer: Self-pay | Admitting: Family

## 2024-06-17 ENCOUNTER — Other Ambulatory Visit: Payer: Self-pay | Admitting: Family

## 2024-06-17 ENCOUNTER — Inpatient Hospital Stay: Attending: Hematology & Oncology

## 2024-06-17 VITALS — BP 147/50 | HR 72 | Temp 98.1°F | Resp 21 | Ht 67.0 in | Wt 174.8 lb

## 2024-06-17 DIAGNOSIS — D500 Iron deficiency anemia secondary to blood loss (chronic): Secondary | ICD-10-CM | POA: Insufficient documentation

## 2024-06-17 DIAGNOSIS — K589 Irritable bowel syndrome without diarrhea: Secondary | ICD-10-CM | POA: Insufficient documentation

## 2024-06-17 LAB — RETICULOCYTES
Immature Retic Fract: 24 % — ABNORMAL HIGH (ref 2.3–15.9)
RBC.: 5.57 MIL/uL — ABNORMAL HIGH (ref 3.87–5.11)
Retic Count, Absolute: 76.9 K/uL (ref 19.0–186.0)
Retic Ct Pct: 1.4 % (ref 0.4–3.1)

## 2024-06-17 LAB — CBC WITH DIFFERENTIAL (CANCER CENTER ONLY)
Abs Immature Granulocytes: 0.03 K/uL (ref 0.00–0.07)
Basophils Absolute: 0.1 K/uL (ref 0.0–0.1)
Basophils Relative: 1 %
Eosinophils Absolute: 0.2 K/uL (ref 0.0–0.5)
Eosinophils Relative: 2 %
HCT: 45.1 % (ref 36.0–46.0)
Hemoglobin: 13.8 g/dL (ref 12.0–15.0)
Immature Granulocytes: 0 %
Lymphocytes Relative: 22 %
Lymphs Abs: 1.9 K/uL (ref 0.7–4.0)
MCH: 24.6 pg — ABNORMAL LOW (ref 26.0–34.0)
MCHC: 30.6 g/dL (ref 30.0–36.0)
MCV: 80.4 fL (ref 80.0–100.0)
Monocytes Absolute: 0.7 K/uL (ref 0.1–1.0)
Monocytes Relative: 8 %
Neutro Abs: 5.7 K/uL (ref 1.7–7.7)
Neutrophils Relative %: 67 %
Platelet Count: 350 K/uL (ref 150–400)
RBC: 5.61 MIL/uL — ABNORMAL HIGH (ref 3.87–5.11)
RDW: 20.9 % — ABNORMAL HIGH (ref 11.5–15.5)
WBC Count: 8.5 K/uL (ref 4.0–10.5)
nRBC: 0 % (ref 0.0–0.2)

## 2024-06-17 LAB — IRON AND IRON BINDING CAPACITY (CC-WL,HP ONLY)
Iron: 40 ug/dL (ref 28–170)
Saturation Ratios: 12 % (ref 10.4–31.8)
TIBC: 333 ug/dL (ref 250–450)
UIBC: 293 ug/dL

## 2024-06-17 LAB — FERRITIN: Ferritin: 87 ng/mL (ref 11–307)

## 2024-06-17 NOTE — Progress Notes (Signed)
 Hematology and Oncology Follow Up Visit  Rhonda Bailey [id number] 09/01/1945 78 y.o. 06/17/2024   Principle Diagnosis:  Iron  deficiency anemia due to chronic blood loss with IBS (Crohn's vs. UC)   Current Therapy:  IV iron  as indicated    Interim History:  Rhonda Bailey is here today for follow-up. She is feeling much better since receiving IV iron  in September.  Unfortunately both her husband and daughter (both had cancer) have passed away since we last saw her. She had stayed busy caring for them up until they passed.  She has a wonderful family support system. She enjoys spending time with her grand and great grandchildren.  No issue with blood loss. No bruising or petechiae.  No fever, chills, n/v, cough, rash, dizziness, SOB,chest pain, palpitations, abdominal pain or changes in bowel or bladder habits.  No swelling, in her extremities.  No falls or syncope reported.  Appetite and hydration are improved. Her weight is stable at 174 lbs.   ECOG Performance Status: 1 - Symptomatic but completely ambulatory  Medications:  Allergies as of 06/17/2024       Reactions   Tramadol  Other (See Comments)   Says blew my head up.        Medication List        Accurate as of June 17, 2024 10:40 AM. If you have any questions, ask your nurse or doctor.          albuterol  1.25 MG/3ML nebulizer solution Commonly known as: ACCUNEB  Take 1 ampule by nebulization 2 (two) times daily as needed for wheezing.   ALPRAZolam  0.5 MG tablet Commonly known as: XANAX  Take 0.5 mg by mouth at bedtime as needed for sleep.   amLODipine  5 MG tablet Commonly known as: NORVASC  Take 5 mg by mouth daily.   bisacodyl  5 MG EC tablet Generic drug: bisacodyl  Take 5 mg by mouth daily as needed for moderate constipation.   cholecalciferol  25 MCG (1000 UNIT) tablet Commonly known as: VITAMIN D3 Take 2,000 Units by mouth daily.   diphenhydramine-acetaminophen  25-500 MG Tabs tablet Commonly  known as: TYLENOL  PM Take 1 tablet by mouth at bedtime as needed.   ferrous sulfate  325 (65 FE) MG EC tablet Take 1 tablet (325 mg total) by mouth daily with breakfast.   levothyroxine  50 MCG tablet Commonly known as: SYNTHROID  Take 50 mcg by mouth every morning.   multivitamin with minerals Tabs tablet Take 1 tablet by mouth daily.   pantoprazole  40 MG tablet Commonly known as: PROTONIX  Take 1 tablet (40 mg total) by mouth 2 (two) times daily before a meal.   predniSONE  20 MG tablet Commonly known as: DELTASONE  Take 20 mg by mouth 2 (two) times daily with a meal. For 5 days   simvastatin 10 MG tablet Commonly known as: ZOCOR Take 10 mg by mouth daily.   sodium bicarbonate  650 MG tablet Take 1 tablet (650 mg total) by mouth daily.   sulfaSALAzine  500 MG tablet Commonly known as: AZULFIDINE  Take 4 tablets (2,000 mg total) by mouth daily.        Allergies:  Allergies  Allergen Reactions   Tramadol  Other (See Comments)    Says blew my head up.    Past Medical History, Surgical history, Social history, and Family History were reviewed and updated.  Review of Systems: All other 10 point review of systems is negative.   Physical Exam:  height is 5' 7 (1.702 m) and weight is 174 lb 12.8 oz (79.3 kg). Her  oral temperature is 98.1 F (36.7 C). Her blood pressure is 147/50 (abnormal) and her pulse is 72. Her respiration is 21 (abnormal) and oxygen saturation is 95%.   Wt Readings from Last 3 Encounters:  06/17/24 174 lb 12.8 oz (79.3 kg)  05/13/24 177 lb (80.3 kg)  04/15/24 176 lb 0.6 oz (79.9 kg)    Ocular: Sclerae unicteric, pupils equal, round and reactive to light Ear-nose-throat: Oropharynx clear, dentition fair Lymphatic: No cervical or supraclavicular adenopathy Lungs no rales or rhonchi, good excursion bilaterally Heart regular rate and rhythm, no murmur appreciated Abd soft, nontender, positive bowel sounds MSK no focal spinal tenderness, no joint  edema Neuro: non-focal, well-oriented, appropriate affect Breasts: Deferred   Lab Results  Component Value Date   WBC 8.5 06/17/2024   HGB 13.8 06/17/2024   HCT 45.1 06/17/2024   MCV 80.4 06/17/2024   PLT 350 06/17/2024   Lab Results  Component Value Date   FERRITIN 15.6 04/11/2024   IRON  6 (L) 04/11/2024   TIBC 373.8 04/11/2024   UIBC 268 11/01/2022   IRONPCTSAT 1.6 (L) 04/11/2024   Lab Results  Component Value Date   RETICCTPCT 1.4 06/17/2024   RBC 5.57 (H) 06/17/2024   RETICCTABS 62,860 12/09/2019   No results found for: JONATHAN BONG [facility] Lab Results  Component Value Date   IGGSERUM 870 12/09/2019   No results found for: STEPHANY CARLOTA BENSON MARKEL EARLA JOANNIE DOC VICK, SPEI   Chemistry      Component Value Date/Time   NA 140 03/13/2023 0907   NA 142 03/19/2019 0000   K 4.8 03/13/2023 0907   CL 103 03/13/2023 0907   CO2 25 03/13/2023 0907   BUN 18 03/13/2023 0907   BUN 16 03/19/2019 0000   CREATININE 1.13 03/13/2023 0907   CREATININE 1.14 (H) 08/30/2020 0813   GLU 101 03/19/2019 0000      Component Value Date/Time   CALCIUM  10.1 03/13/2023 0907   ALKPHOS 80 03/13/2023 0907   AST 16 03/13/2023 0907   AST 17 08/30/2020 0813   ALT 11 03/13/2023 0907   ALT 11 08/30/2020 0813   BILITOT 0.4 03/13/2023 0907   BILITOT 0.4 08/30/2020 0813       Impression and Plan: Rhonda Bailey is a very pleasant 78 yo caucasian female with iron  deficiency secondary to chronic blood loss with IBS (Crohn's vs. UC).  Iron  studies are pending. We will replace again if needed.  Follow-up in 3 months.   Lauraine Pepper, NP 11/11/202510:40 AM

## 2024-06-26 DIAGNOSIS — I10 Essential (primary) hypertension: Secondary | ICD-10-CM | POA: Diagnosis not present

## 2024-06-26 DIAGNOSIS — N1831 Chronic kidney disease, stage 3a: Secondary | ICD-10-CM | POA: Diagnosis not present

## 2024-07-06 DIAGNOSIS — E039 Hypothyroidism, unspecified: Secondary | ICD-10-CM | POA: Diagnosis not present

## 2024-07-06 DIAGNOSIS — E7800 Pure hypercholesterolemia, unspecified: Secondary | ICD-10-CM | POA: Diagnosis not present

## 2024-07-06 DIAGNOSIS — N1831 Chronic kidney disease, stage 3a: Secondary | ICD-10-CM | POA: Diagnosis not present

## 2024-07-06 DIAGNOSIS — I10 Essential (primary) hypertension: Secondary | ICD-10-CM | POA: Diagnosis not present

## 2024-07-07 ENCOUNTER — Ambulatory Visit: Admitting: Gastroenterology

## 2024-07-07 ENCOUNTER — Telehealth: Payer: Self-pay | Admitting: Gastroenterology

## 2024-07-07 NOTE — Telephone Encounter (Signed)
 Inbound call from patient requesting to reschedule her appointment today due to her having muscle spasms  in her back. Patient was reschedule for January the 8 th at 10 am. Patient is requesting a call back from the nurse due to her wanting to discuss having lab work done. Please advise.

## 2024-07-07 NOTE — Telephone Encounter (Signed)
 Spoke with the pt and discussed lab work that she is due for.  She wanted to make sure she does not need an appt.  I did confirm that she does not need an appt and she will come in as soon as she is able.

## 2024-07-15 ENCOUNTER — Inpatient Hospital Stay: Admitting: Family

## 2024-07-15 ENCOUNTER — Inpatient Hospital Stay

## 2024-08-07 ENCOUNTER — Encounter: Payer: Self-pay | Admitting: Family

## 2024-08-11 ENCOUNTER — Other Ambulatory Visit (INDEPENDENT_AMBULATORY_CARE_PROVIDER_SITE_OTHER)

## 2024-08-11 DIAGNOSIS — K51919 Ulcerative colitis, unspecified with unspecified complications: Secondary | ICD-10-CM

## 2024-08-11 DIAGNOSIS — K6289 Other specified diseases of anus and rectum: Secondary | ICD-10-CM | POA: Diagnosis not present

## 2024-08-11 LAB — COMPREHENSIVE METABOLIC PANEL WITH GFR
ALT: 13 U/L (ref 3–35)
AST: 16 U/L (ref 5–37)
Albumin: 4.3 g/dL (ref 3.5–5.2)
Alkaline Phosphatase: 87 U/L (ref 39–117)
BUN: 20 mg/dL (ref 6–23)
CO2: 23 meq/L (ref 19–32)
Calcium: 9.2 mg/dL (ref 8.4–10.5)
Chloride: 108 meq/L (ref 96–112)
Creatinine, Ser: 0.98 mg/dL (ref 0.40–1.20)
GFR: 55.15 mL/min — ABNORMAL LOW
Glucose, Bld: 100 mg/dL — ABNORMAL HIGH (ref 70–99)
Potassium: 3.9 meq/L (ref 3.5–5.1)
Sodium: 143 meq/L (ref 135–145)
Total Bilirubin: 0.5 mg/dL (ref 0.2–1.2)
Total Protein: 7.4 g/dL (ref 6.0–8.3)

## 2024-08-11 LAB — CBC WITH DIFFERENTIAL/PLATELET
Basophils Absolute: 0.1 K/uL (ref 0.0–0.1)
Basophils Relative: 1.1 % (ref 0.0–3.0)
Eosinophils Absolute: 0.3 K/uL (ref 0.0–0.7)
Eosinophils Relative: 3.3 % (ref 0.0–5.0)
HCT: 37 % (ref 36.0–46.0)
Hemoglobin: 11.5 g/dL — ABNORMAL LOW (ref 12.0–15.0)
Lymphocytes Relative: 25.4 % (ref 12.0–46.0)
Lymphs Abs: 2 K/uL (ref 0.7–4.0)
MCHC: 31.1 g/dL (ref 30.0–36.0)
MCV: 75.5 fl — ABNORMAL LOW (ref 78.0–100.0)
Monocytes Absolute: 0.8 K/uL (ref 0.1–1.0)
Monocytes Relative: 10.2 % (ref 3.0–12.0)
Neutro Abs: 4.8 K/uL (ref 1.4–7.7)
Neutrophils Relative %: 60 % (ref 43.0–77.0)
Platelets: 379 K/uL (ref 150.0–400.0)
RBC: 4.9 Mil/uL (ref 3.87–5.11)
RDW: 21 % — ABNORMAL HIGH (ref 11.5–15.5)
WBC: 8 K/uL (ref 4.0–10.5)

## 2024-08-11 LAB — PROTIME-INR
INR: 1.1 ratio — ABNORMAL HIGH (ref 0.8–1.0)
Prothrombin Time: 11.4 s (ref 9.6–13.1)

## 2024-08-11 LAB — IBC + FERRITIN
Ferritin: 7.2 ng/mL — ABNORMAL LOW (ref 10.0–291.0)
Iron: 30 ug/dL — ABNORMAL LOW (ref 42–145)
Saturation Ratios: 7.6 % — ABNORMAL LOW (ref 20.0–50.0)
TIBC: 393.4 ug/dL (ref 250.0–450.0)
Transferrin: 281 mg/dL (ref 212.0–360.0)

## 2024-08-12 ENCOUNTER — Ambulatory Visit: Payer: Self-pay | Admitting: Gastroenterology

## 2024-08-12 NOTE — Progress Notes (Unsigned)
 "  Rhonda Bailey [id number] Dec 16, 1945   Chief Complaint: Anemia, colitis  Referring Provider: Regino Slater, MD Primary GI MD: Dr. Wilhelmenia  HPI: Rhonda Bailey is a 79 y.o. female with past medical history of HTN, anxiety, hypothyroidism, sleep apnea, IDA, nephrolithiasis, hemorrhoids s/p resection, GERD, diverticulosis with history of hemorrhage in 2022, IBD (most likely Crohn's colitis versus pan ulcerative colitis - recently on AZA/Humira  but developed antibodies so transitioned to Lialda  and then to sulfasalazine  due to insurance and off AZA) who presents today for follow up.    Seen in office 03/13/2023 by Dr. Wilhelmenia at which time she was hemodynamically and clinically stable from GI perspective and doing well on sulfasalazine  therapy.  It was discussed that though she was in clinical remission she had not had repeat endoscopic/mucosal evaluation in a few years and was advised it would make sense to repeat these but she was hesitant.  Was given Anusol  suppositories for use as needed for hemorrhoidal bleeding.  Advised to continue daily stool softeners, fiber, and sulfasalazine  at 1000 mg daily with increase up to 2000 mg daily if necessary.   Fecal calprotectin was ordered and was elevated to 995.  Colonoscopy was strongly recommended but patient declined.   Patient called 04/09/2024 reporting that she was advised by PCP to contact the office due to abnormal lab findings.  These were done at [facility], had hemoglobin of 9.8.   Labs 04/11/2024: Hemoglobin 9.8, hematocrit 30.7, MCV 77.6, platelets 472, CRP 8.26, sed rate 29, folate 15.3, vitamin B12 677, iron  6, ferritin 15.6   She was advised to start back on ferrous sulfate  325 mg once daily, and referred to hematology for iron  infusions.  At last visit 04/14/2024 repeat fecal calprotectin had not yet resulted, but did come back elevated at 786. Patient reported an episode of black tarry stools 1.5 to 2 weeks prior which had resolved and she  was having regular, formed brown bowel movements daily at time of last visit.  Denied any rectal bleeding or other GI symptoms.  Had not yet started her iron  supplement.  Possibly of a positive FOBT in office though stool was brown and there was no visible bleeding. Also endorsed a productive cough and shortness of breath for 3 weeks, went to walk-in clinic was diagnosed with pneumonia and started on antibiotics and prednisone  which she completed.  Had follow-up with PCP a week prior and was told she had bronchitis.  She was scheduled for EGD/colonoscopy 05/13/2024.  She had canceled her appointment with hematology in order to see us , we advised her to call and reschedule to get started on iron  infusions.  They will CT procedures as scheduled and follow-up regarding her upper respiratory symptoms.  Repeat CBC showed worsening anemia with hemoglobin 9.3.  Advised to pick up her prescription iron  and start taking, keep appointment with hematology and hopefully start IV iron .  Follow-up CBC showed improvement in anemia.  Patient underwent EGD/colonoscopy 05/13/2024.  Found to have gastric intestinal metaplasia and needs repeat EGD in 6 months.  Also found to have active left-sided colitis and proctitis.  Sulfasalazine  dosing was increased to 2000 mg/day, but noted that it really should be up to 4000 mg/day.  Repeat labs showed recurrence of iron  deficiency anemia.  Per Dr. Wilhelmenia, recommendation is to follow-up regarding her GI symptoms and plan repeat inflammatory markers and fecal calprotectin to see how she is doing with uptitrated sulfasalazine .  Persistent iron  deficiency thought to be due to chronic active colitis.  Patient has been hesitant about going on other biologic therapies. Hematology contacted to see if patient may be able to receive additional iron  infusions, looks like this is being set up.  Labs 08/11/2024: Hemoglobin 11.5, MCV 75.5, no leukocytosis or thrombocytopenia.  CMP with slight  decrease in GFR to 55 and otherwise unremarkable.  Normal PT/INR.  Low ferritin 7.2, low iron  30, saturation ratio 7.6.   When is first iron  infusion?    Discussed the use of AI scribe software for clinical note transcription with the patient, who gave verbal consent to proceed.  History of Present Illness Rhonda Bailey is a 79 year old female with ulcerative colitis, iron  deficiency anemia, and gastric intestinal metaplasia who presents for follow-up after recent endoscopy and medication adjustment.  Bowel symptoms and ulcerative colitis management - Ulcerative colitis managed with sulfasalazine , recently increased to 2000 mg daily. - Significant improvement in bowel movements, now more formed and regular. - No diarrhea, hematochezia, melena, or abdominal pain. - History of hemorrhoids with prior bleeding, but no current issues.  Iron  deficiency anemia - Iron  deficiency anemia present. - Not currently taking oral iron  supplements. - Recent laboratory studies showed low iron  levels and mildly decreased blood count. - No symptoms related to anemia. - Previously received iron  infusions and tolerated them without significant side effects.  Gastric intestinal metaplasia - Underwent upper endoscopy and colonoscopy in October 2025, which revealed gastric intestinal metaplasia.  Constitutional and gastrointestinal symptoms - No chest pain, shortness of breath, nausea, vomiting, or changes in appetite.  Psychological stress - Significant emotional stress due to recent deaths of husband and daughter.      Previous GI Procedures/Imaging   EGD 05/13/2024 - No gross lesions in the entire esophagus. Z- line irregular, 33 cm from the incisors.  - 6 cm hiatal hernia.  - Gastritis in antrum/ prepylorus. No other gross lesions in the entire stomach. Biopsied.  - No gross lesions in the duodenal bulb, in the first portion of the duodenum and in the second portion of the duodenum.  Biopsied.  Colonoscopy 05/13/2024 - Perianal skin tags found on perianal exam.  - Rectal stricture found on digital rectal exam.  - Tortuous colon.  - Normal mucosa at the splenic flexure, in the transverse colon, at the hepatic flexure, in the ascending colon, in the cecum and at the appendiceal orifice. Biopsied.  - Segmental moderate mucosal changes were found in the recto- sigmoid colon, in the sigmoid colon and in the descending colon secondary to colitis. Biopsied.  - Normal mucosa in the rectum. Biopsied.  - Anal papilla was hypertrophied.  - Non- bleeding non- thrombosed internal hemorrhoids.  Path: 1. Surgical [P], gastric :      - CHRONIC INACTIVE GASTRITIS WITH INTESTINAL METAPLASIA      - NEGATIVE FOR H. PYLORI ON H&E STAIN      - NEGATIVE FOR DYSPLASIA OR MALIGNANCY       2. Surgical [P], duodenum :      - BENIGN SMALL BOWEL MUCOSA WITH NO SIGNIFICANT PATHOLOGIC CHANGES       3. Surgical [P], right colon :      - BENIGN COLONIC MUCOSA WITH NO SPECIFIC PATHOLOGIC CHANGES       4. Surgical [P], left colon :      - MILDLY ACTIVE CHRONIC COLITIS      - NEGATIVE FOR DYSPLASIA OR MALIGNANCY       5. Surgical [P], colon, rectum :      -  MILDLY ACTIVE CHRONIC PROCTITIS      - NEGATIVE FOR ADENOMATOUS CHANGE, DYSPLASIA OR MALIGNANCY  Colonoscopy 12/04/2019 - Rectal tenderness, hemorrhoids and rectal stricture found on digital rectal exam.  - The examined portion of the ileum was normal. Biopsied.  - 8 mm polyp was found in the proximal ascending colon. The polyp was sessile. The polyp was removed with a cold snare. Resection and retrieval were complete.  - Colitis. Inflammation was found in the transverse colon, in the hepatic flexure, in the ascending colon and at the cecum. This was graded as Mayo Score 1 (mild disease) and graded as Mayo Score 2 (moderate disease) . Biopsied.  - Colitis. Inflammation was found from the rectum to the splenic flexure. This was graded as Mayo  Score 3 (severe disease) . Biopsied. - A single (solitary) ulcer in the distal rectum. Biopsied.  - Diverticulosis in the entire examined colon.  - Non- bleeding non- thrombosed external and internal hemorrhoids. Path: 1. Surgical [P], small bowel, terminal ileum - ILEAL MUCOSA WITH NO SIGNIFICANT PATHOLOGIC FINDINGS. - NEGATIVE FOR ACTIVE INFLAMMATION, GRANULOMATA AND DYSPLASIA. 2. Surgical [P], right colon - MILD-MODERATELY ACTIVE CHRONIC COLITIS. - NEGATIVE FOR GRANULOMATA AND DYSPLASIA. 3. Surgical [P], colon, ascending, polyp - POLYPOID COLONIC MUCOSA WITH MILD HYPERPLASTIC CHANGES. - NEGATIVE FOR DYSPLASIA. 4. Surgical [P], left colon - SEVERELY ACTIVE CHRONIC COLITIS WITH ULCERATION. - NEGATIVE FOR GRANULOMATA AND DYSPLASIA. 5. Surgical [P], colon, rectum - SEVERELY ACTIVE CHRONIC COLITIS WITH ULCERATION. - NEGATIVE FOR GRANULOMATA AND DYSPLASIA. 6. Surgical [P], colon, distal rectal ulcer - SEVERELY ACTIVE CHRONIC COLITIS WITH ULCERATION. - NEGATIVE FOR GRANULOMATA AND DYSPLASIA.  Past Medical History:  Diagnosis Date   Anal fissure    Anxiety    Anxiety disorder, unspecified    Essential (primary) hypertension    Gastric intestinal metaplasia    Gastro-esophageal reflux disease with esophagitis    GERD (gastroesophageal reflux disease)    Hypokalemia    Hypothyroidism    Kidney stone    Noninfective gastroenteritis and colitis, unspecified    Pinched vertebral nerve    back   Sleep apnea     Past Surgical History:  Procedure Laterality Date   BIOPSY  09/02/2018   Procedure: BIOPSY;  Surgeon: Shaaron Lamar HERO, MD;  Location: [facility];  Service: Endoscopy;;  colon    CHOLECYSTECTOMY  05/19/2011   Procedure: LAPAROSCOPIC CHOLECYSTECTOMY;  Surgeon: Oneil DELENA Budge;  Location: [facility];  Service: General;  Laterality: N/A;   COLONOSCOPY  11/2019   Dr. Wilhelmenia:  rectal ulceration, TI and IC valve appeared normal, 8 mm polyp in proximal ascending colon that was  sessile. Inflammation in transverse colon to cecum, inflammation with altered vascularity, edema, erosions, erythema, friability, shallow ulcerations in a continuous and circumferential pattern from rectum to splenix flexure, single twenty mm ulcer in distal rectum. Small and large-   COLONOSCOPY WITH PROPOFOL  N/A 09/02/2018   Dr. Shaaron: Proctocolitis limited to the left side with some exudate versus partial pseudomembrane formation, erosions and ulceration somewhat superficial.  Terminal ileum normal.  Diverticulosis.  Descending/sigmoid colon biopsy showing severe active colitis, diagnostic features of IBD not identified.  Differential includes infection, ischemia.  Rectal biopsies with granulation tissue c/w ulcer   CYSTOSCOPY W/ URETERAL STENT PLACEMENT Right 05/08/2019   Procedure: CYSTOSCOPY WITH RETROGRADE PYELOGRAM/URETERAL STENT PLACEMENT;  Surgeon: Nieves Cough, MD;  Location: [facility];  Service: Urology;  Laterality: Right;   CYSTOSCOPY W/ URETERAL STENT PLACEMENT Left 05/17/2020   Procedure: CYSTOSCOPY WITH  RETROGRADE PYELOGRAM/URETERAL STENT PLACEMENT;  Surgeon: Elisabeth Valli BIRCH, MD;  Location: [facility];  Service: Urology;  Laterality: Left;   CYSTOSCOPY WITH RETROGRADE PYELOGRAM, URETEROSCOPY AND STENT PLACEMENT Left 06/23/2020   Procedure: CYSTOSCOPY WITH LEFT RETROGRADE PYELOGRAM, LEFT URETEROSCOPY AND STENT EXCHANGE;  Surgeon: Sherrilee Belvie CROME, MD;  Location: [facility];  Service: Urology;  Laterality: Left;   CYSTOSCOPY WITH URETEROSCOPY Right 06/11/2019   Procedure: CYSTOSCOPY WITH DIAGNOSTIC URETEROSCOPY; URETERAL STENT REMOVAL;  Surgeon: Sherrilee Belvie CROME, MD;  Location: [facility];  Service: Urology;  Laterality: Right;   HEMORRHOID SURGERY N/A 09/16/2019   Procedure: EXTENSIVE HEMORRHOIDECTOMY;  Surgeon: Mavis Anes, MD;  Location: [facility];  Service: General;  Laterality: N/A;   RECTAL BIOPSY N/A 09/16/2019   Procedure: BIOPSY RECTAL;  Surgeon: Mavis Anes, MD;  Location: [facility];   Service: General;  Laterality: N/A;   STONE EXTRACTION WITH BASKET  06/23/2020   Procedure: LEFT URETERAL STONE EXTRACTION WITH BASKET;  Surgeon: Sherrilee Belvie CROME, MD;  Location: [facility];  Service: Urology;;   TUBAL LIGATION      Current Outpatient Medications  Medication Sig Dispense Refill   albuterol  (ACCUNEB ) 1.25 MG/3ML nebulizer solution Take 1 ampule by nebulization 2 (two) times daily as needed for wheezing.     ALPRAZolam  (XANAX ) 0.5 MG tablet Take 0.5 mg by mouth at bedtime as needed for sleep.     amLODipine  (NORVASC ) 5 MG tablet Take 5 mg by mouth daily.     bisacodyl  5 MG EC tablet Take 5 mg by mouth daily as needed for moderate constipation. (Patient not taking: Reported on 06/17/2024)     cholecalciferol  (VITAMIN D3) 25 MCG (1000 UNIT) tablet Take 2,000 Units by mouth daily.     diphenhydramine-acetaminophen  (TYLENOL  PM) 25-500 MG TABS tablet Take 1 tablet by mouth at bedtime as needed.     ferrous sulfate  325 (65 FE) MG EC tablet Take 1 tablet (325 mg total) by mouth daily with breakfast. (Patient not taking: Reported on 08/14/2024) 30 tablet 3   levothyroxine  (SYNTHROID ) 50 MCG tablet Take 50 mcg by mouth every morning.     Multiple Vitamin (MULTIVITAMIN WITH MINERALS) TABS tablet Take 1 tablet by mouth daily.     pantoprazole  (PROTONIX ) 40 MG tablet Take 1 tablet (40 mg total) by mouth 2 (two) times daily before a meal. 60 tablet 1   sodium bicarbonate  650 MG tablet Take 1 tablet (650 mg total) by mouth daily. 30 tablet 0   sulfaSALAzine  (AZULFIDINE ) 500 MG tablet Take 4 tablets (2,000 mg total) by mouth daily. 120 tablet 6   No current facility-administered medications for this visit.    Allergies as of 08/14/2024 - Review Complete 08/14/2024  Allergen Reaction Noted   Tramadol  Other (See Comments) 05/08/2019   Simvastatin Other (See Comments) 08/14/2024    Family History  Problem Relation Age of Onset   Cancer Father        mouth   Heart disease Sister    Heart  disease Brother    Cancer Brother        head   Cancer Daughter        Leiomyosarcoma   Anesthesia problems Neg Hx    Hypotension Neg Hx    Malignant hyperthermia Neg Hx    Pseudochol deficiency Neg Hx    Colon cancer Neg Hx    Esophageal cancer Neg Hx    Inflammatory bowel disease Neg Hx    Liver disease Neg Hx    Pancreatic cancer  Neg Hx    Rectal cancer Neg Hx    Stomach cancer Neg Hx     Social History[1]   Review of Systems:    Constitutional: No weight loss, fever, chills, weakness or fatigue Eyes: No change in vision Ears, Nose, Throat:  No change in hearing or congestion Skin: No rash or itching Cardiovascular: No chest pain, chest pressure or palpitations   Respiratory: No SOB or cough Gastrointestinal: See HPI and otherwise negative Genitourinary: No dysuria or change in urinary frequency Neurological: No headache, dizziness or syncope Musculoskeletal: No new muscle or joint pain Hematologic: No bleeding or bruising    Physical Exam:  Vital signs: BP 138/70   Pulse 75   Ht 5' 7 (1.702 m)   Wt 178 lb 14.4 oz (81.1 kg)   BMI 28.02 kg/m   Wt Readings from Last 3 Encounters:  08/14/24 178 lb 14.4 oz (81.1 kg)  06/17/24 174 lb 12.8 oz (79.3 kg)  05/13/24 177 lb (80.3 kg)     Constitutional: NAD, Well developed, Well nourished, alert and cooperative Head:  Normocephalic and atraumatic.  Eyes: No scleral icterus. Conjunctiva pink. Mouth: No oral lesions. Respiratory: Respirations even and unlabored. Lungs clear to auscultation bilaterally.  No wheezes, crackles, or rhonchi.  Cardiovascular:  Regular rate and rhythm. No murmurs. No peripheral edema. Gastrointestinal:  Soft, nondistended, nontender. No rebound or guarding. Normal bowel sounds. No appreciable masses or hepatomegaly. Rectal:  Not performed.  Neurologic:  Alert and oriented x4;  grossly normal neurologically.  Skin:   Dry and intact without significant lesions or rashes. Psychiatric: Oriented  to person, place and time. Demonstrates good judgement and reason without abnormal affect or behaviors.      Assessment/Plan:   Assessment & Plan Inflammatory bowel disease Well-controlled on sulfasalazine  without active symptoms. Ongoing monitoring indicated. - Ordered laboratory studies to assess inflammatory markers. - Ordered fecal calprotectin to evaluate disease activity and therapeutic response.  Iron  deficiency anemia Chronic iron  deficiency anemia with low iron  levels. Hematology coordinating iron  infusions. - Coordinated with hematology regarding iron  infusion scheduling. - Deferred oral iron  supplementation pending outcome of iron  infusion scheduling. - Planned gastroenterology follow-up to monitor anemia and adjust management.  Gastric intestinal metaplasia Precancerous gastric lesion identified. Surveillance endoscopy recommended but deferred per patient preference. - Discussed risks and benefits of surveillance endoscopy. - Deferred repeat upper endoscopy per patient preference. - Advised her to contact the office if she elects to proceed with surveillance. - Planned gastroenterology follow-up to reassess and provide ongoing education and support.     CMP, ESR, fecal calprotectin Continue current dose of sulfasalazine  and will see what fecal calprotectin shows Consider going up on dose or wait and discuss at follow up  Declines repeat EGD Declines repeat colonoscopy   Get update from hematology regarding venofer     Camie Furbish, PA-C [facility] 08/14/2024, 10:12 AM  Patient Care Team: Regino Slater, MD as PCP - General (Family Medicine)       [1]  Social History Tobacco Use   Smoking status: Never   Smokeless tobacco: Never  Vaping Use   Vaping status: Never Used  Substance Use Topics   Alcohol use: Not Currently    Comment: once a year   Drug use: No   "

## 2024-08-13 NOTE — Telephone Encounter (Signed)
 Call from pt stating she is returning a call from [place]. Please advise. Thank you.

## 2024-08-14 ENCOUNTER — Other Ambulatory Visit (INDEPENDENT_AMBULATORY_CARE_PROVIDER_SITE_OTHER)

## 2024-08-14 ENCOUNTER — Telehealth: Payer: Self-pay | Admitting: Family

## 2024-08-14 ENCOUNTER — Ambulatory Visit: Payer: Self-pay | Admitting: Gastroenterology

## 2024-08-14 ENCOUNTER — Encounter: Payer: Self-pay | Admitting: Gastroenterology

## 2024-08-14 ENCOUNTER — Ambulatory Visit: Admitting: Gastroenterology

## 2024-08-14 VITALS — BP 138/70 | HR 75 | Ht 67.0 in | Wt 178.9 lb

## 2024-08-14 DIAGNOSIS — K529 Noninfective gastroenteritis and colitis, unspecified: Secondary | ICD-10-CM

## 2024-08-14 DIAGNOSIS — K51919 Ulcerative colitis, unspecified with unspecified complications: Secondary | ICD-10-CM

## 2024-08-14 DIAGNOSIS — K6289 Other specified diseases of anus and rectum: Secondary | ICD-10-CM

## 2024-08-14 DIAGNOSIS — K31A0 Gastric intestinal metaplasia, unspecified: Secondary | ICD-10-CM

## 2024-08-14 DIAGNOSIS — D509 Iron deficiency anemia, unspecified: Secondary | ICD-10-CM

## 2024-08-14 DIAGNOSIS — K2970 Gastritis, unspecified, without bleeding: Secondary | ICD-10-CM

## 2024-08-14 DIAGNOSIS — R195 Other fecal abnormalities: Secondary | ICD-10-CM

## 2024-08-14 LAB — SEDIMENTATION RATE: Sed Rate: 15 mm/h (ref 0–30)

## 2024-08-14 LAB — C-REACTIVE PROTEIN: CRP: 0.9 mg/dL — ABNORMAL LOW (ref 1.0–20.0)

## 2024-08-14 NOTE — Telephone Encounter (Signed)
 Called to schedule infusions per inbasket. LVM to return call for scheduling.

## 2024-08-14 NOTE — Patient Instructions (Addendum)
 Please go to the lab in the basement of our building to have lab work done as you leave today. Hit B for basement when you get on the elevator.  When the doors open the lab is on your left.  We will call you with the results. Thank you.  We will follow up with [facility] office about the status of your iron  infusions. They will contact you to be scheduled.  Please follow up with Dr. Wilhelmenia in 3 months, April.  That schedule is not open yet.  You can call next month to be scheduled:  [phone].   Thank you for entrusting me with your care and for choosing [facility], [place], [place]   _______________________________________________________  If your blood pressure at your visit was 140/90 or greater, please contact your primary care physician to follow up on this.  _______________________________________________________  If you are age 68 or older, your body mass index should be between 23-30. Your Body mass index is 28.02 kg/m. If this is out of the aforementioned range listed, please consider follow up with your Primary Care Provider.  If you are age 77 or younger, your body mass index should be between 19-25. Your Body mass index is 28.02 kg/m. If this is out of the aformentioned range listed, please consider follow up with your Primary Care Provider.   ________________________________________________________  The [facility] providers would like to encourage you to use MYCHART to communicate with providers for non-urgent requests or questions.  Due to long hold times on the telephone, sending your provider a message by [facility] may be a faster and more efficient way to get a response.  Please allow 48 business hours for a response.  Please remember that this is for non-urgent requests.  _______________________________________________________  [facility] is using a team-based approach to care.  Your team is made up of your doctor and two to three APPS. Our  APPS (Nurse Practitioners and Physician Assistants) work with your physician to ensure care continuity for you. They are fully qualified to address your health concerns and develop a treatment plan. They communicate directly with your gastroenterologist to care for you. Seeing the Advanced Practice Practitioners on your physician's team can help you by facilitating care more promptly, often allowing for earlier appointments, access to diagnostic testing, procedures, and other specialty referrals.

## 2024-08-17 ENCOUNTER — Encounter: Payer: Self-pay | Admitting: Gastroenterology

## 2024-08-19 ENCOUNTER — Other Ambulatory Visit

## 2024-08-19 DIAGNOSIS — K529 Noninfective gastroenteritis and colitis, unspecified: Secondary | ICD-10-CM

## 2024-08-19 DIAGNOSIS — R195 Other fecal abnormalities: Secondary | ICD-10-CM

## 2024-08-19 DIAGNOSIS — K6289 Other specified diseases of anus and rectum: Secondary | ICD-10-CM

## 2024-08-19 DIAGNOSIS — D509 Iron deficiency anemia, unspecified: Secondary | ICD-10-CM

## 2024-08-19 DIAGNOSIS — K2970 Gastritis, unspecified, without bleeding: Secondary | ICD-10-CM

## 2024-08-21 ENCOUNTER — Inpatient Hospital Stay: Attending: Hematology & Oncology

## 2024-08-21 VITALS — BP 157/65 | HR 85 | Temp 97.9°F | Resp 18

## 2024-08-21 DIAGNOSIS — D500 Iron deficiency anemia secondary to blood loss (chronic): Secondary | ICD-10-CM | POA: Insufficient documentation

## 2024-08-21 DIAGNOSIS — K589 Irritable bowel syndrome without diarrhea: Secondary | ICD-10-CM | POA: Insufficient documentation

## 2024-08-21 MED ORDER — IRON SUCROSE 300 MG IVPB - SIMPLE MED
300.0000 mg | Freq: Once | Status: AC
  Administered 2024-08-21: 300 mg via INTRAVENOUS
  Filled 2024-08-21: qty 300

## 2024-08-21 MED ORDER — SODIUM CHLORIDE 0.9 % IV SOLN: Freq: Once | INTRAVENOUS | Status: AC

## 2024-08-21 NOTE — Progress Notes (Signed)
Pt refused to stay for 30 minutes post iron infusion and is without complaints at time of discharge.

## 2024-08-22 LAB — CALPROTECTIN, FECAL: Calprotectin, Fecal: 385 ug/g — ABNORMAL HIGH (ref 0–120)

## 2024-08-26 ENCOUNTER — Encounter: Payer: Self-pay | Admitting: Family

## 2024-08-28 ENCOUNTER — Inpatient Hospital Stay: Payer: Self-pay

## 2024-08-28 VITALS — BP 150/53 | HR 81 | Temp 97.5°F | Resp 18

## 2024-08-28 DIAGNOSIS — D500 Iron deficiency anemia secondary to blood loss (chronic): Secondary | ICD-10-CM

## 2024-08-28 MED ORDER — IRON SUCROSE 300 MG IVPB - SIMPLE MED
300.0000 mg | Freq: Once | Status: AC
  Administered 2024-08-28: 300 mg via INTRAVENOUS
  Filled 2024-08-28: qty 300

## 2024-08-28 MED ORDER — SODIUM CHLORIDE 0.9 % IV SOLN: Freq: Once | INTRAVENOUS | Status: AC

## 2024-08-28 NOTE — Progress Notes (Signed)
"   Attending Physician's Attestation   I have reviewed the chart.   I agree with the Advanced Practitioner's note, impression, and recommendations with any updates as below. Will be interesting to see what fecal calprotectin returns as.  The degree of her inflammation is such that I will think biologic therapy would be in her near future, but since she is feeling better, hopefully we can stave things off with her current sulfasalazine  therapy.   Aloha Finner, MD [facility] Advanced Endoscopy Office # [phone]  "

## 2024-08-28 NOTE — Progress Notes (Signed)
 Pt c/o burning to IV site after multiple attempts to manipulate IV to be more comfortable for patient. Per pt request new PIV started and iron  continued to run to new PIV.   Pt declined to stay for post infusion observation period. Pt stated she has tolerated medication multiple times prior without difficulty. Pt aware to call clinic with any questions or concerns. Pt verbalized understanding and had no further questions.

## 2024-09-04 ENCOUNTER — Inpatient Hospital Stay: Payer: Self-pay

## 2024-09-04 VITALS — BP 137/63 | HR 88 | Temp 97.8°F | Resp 18

## 2024-09-04 DIAGNOSIS — D500 Iron deficiency anemia secondary to blood loss (chronic): Secondary | ICD-10-CM

## 2024-09-04 MED ORDER — SODIUM CHLORIDE 0.9 % IV SOLN: Freq: Once | INTRAVENOUS | Status: AC

## 2024-09-04 MED ORDER — IRON SUCROSE 300 MG IVPB - SIMPLE MED
300.0000 mg | Freq: Once | Status: AC
  Administered 2024-09-04: 300 mg via INTRAVENOUS
  Filled 2024-09-04: qty 300

## 2024-09-04 NOTE — Patient Instructions (Signed)

## 2024-09-17 ENCOUNTER — Inpatient Hospital Stay

## 2024-09-17 ENCOUNTER — Inpatient Hospital Stay: Admitting: Family
